# Patient Record
Sex: Male | Born: 1935 | Race: White | Hispanic: No | Marital: Married | Smoking: Never smoker
Health system: Southern US, Community
[De-identification: ages and names within clinical notes are randomized; demographics above are authoritative.]

## PROBLEM LIST (undated history)

## (undated) DIAGNOSIS — N400 Enlarged prostate without lower urinary tract symptoms: Secondary | ICD-10-CM

## (undated) DIAGNOSIS — K760 Fatty (change of) liver, not elsewhere classified: Secondary | ICD-10-CM

## (undated) DIAGNOSIS — M1990 Unspecified osteoarthritis, unspecified site: Secondary | ICD-10-CM

## (undated) DIAGNOSIS — K5790 Diverticulosis of intestine, part unspecified, without perforation or abscess without bleeding: Secondary | ICD-10-CM

## (undated) DIAGNOSIS — E119 Type 2 diabetes mellitus without complications: Secondary | ICD-10-CM

## (undated) DIAGNOSIS — I2510 Atherosclerotic heart disease of native coronary artery without angina pectoris: Secondary | ICD-10-CM

## (undated) DIAGNOSIS — I498 Other specified cardiac arrhythmias: Secondary | ICD-10-CM

## (undated) DIAGNOSIS — IMO0001 Reserved for inherently not codable concepts without codable children: Secondary | ICD-10-CM

## (undated) DIAGNOSIS — R202 Paresthesia of skin: Secondary | ICD-10-CM

## (undated) DIAGNOSIS — I10 Essential (primary) hypertension: Secondary | ICD-10-CM

## (undated) DIAGNOSIS — I219 Acute myocardial infarction, unspecified: Secondary | ICD-10-CM

## (undated) DIAGNOSIS — I779 Disorder of arteries and arterioles, unspecified: Secondary | ICD-10-CM

## (undated) DIAGNOSIS — K635 Polyp of colon: Secondary | ICD-10-CM

## (undated) DIAGNOSIS — K589 Irritable bowel syndrome without diarrhea: Secondary | ICD-10-CM

## (undated) DIAGNOSIS — E785 Hyperlipidemia, unspecified: Secondary | ICD-10-CM

## (undated) DIAGNOSIS — R9430 Abnormal result of cardiovascular function study, unspecified: Secondary | ICD-10-CM

## (undated) DIAGNOSIS — C61 Malignant neoplasm of prostate: Secondary | ICD-10-CM

## (undated) DIAGNOSIS — R200 Anesthesia of skin: Secondary | ICD-10-CM

## (undated) DIAGNOSIS — IMO0002 Reserved for concepts with insufficient information to code with codable children: Secondary | ICD-10-CM

## (undated) DIAGNOSIS — E663 Overweight: Secondary | ICD-10-CM

## (undated) DIAGNOSIS — Z9289 Personal history of other medical treatment: Secondary | ICD-10-CM

## (undated) DIAGNOSIS — I739 Peripheral vascular disease, unspecified: Secondary | ICD-10-CM

## (undated) DIAGNOSIS — D369 Benign neoplasm, unspecified site: Secondary | ICD-10-CM

## (undated) DIAGNOSIS — K219 Gastro-esophageal reflux disease without esophagitis: Secondary | ICD-10-CM

## (undated) HISTORY — DX: Abnormal result of cardiovascular function study, unspecified: R94.30

## (undated) HISTORY — DX: Essential (primary) hypertension: I10

## (undated) HISTORY — DX: Paresthesia of skin: R20.2

## (undated) HISTORY — DX: Gastro-esophageal reflux disease without esophagitis: K21.9

## (undated) HISTORY — DX: Peripheral vascular disease, unspecified: I73.9

## (undated) HISTORY — DX: Other specified cardiac arrhythmias: I49.8

## (undated) HISTORY — DX: Fatty (change of) liver, not elsewhere classified: K76.0

## (undated) HISTORY — DX: Overweight: E66.3

## (undated) HISTORY — DX: Hyperlipidemia, unspecified: E78.5

## (undated) HISTORY — PX: PROSTATE BIOPSY: SHX241

## (undated) HISTORY — PX: EYE SURGERY: SHX253

## (undated) HISTORY — DX: Anesthesia of skin: R20.0

## (undated) HISTORY — PX: NEUROPLASTY / TRANSPOSITION MEDIAN NERVE AT CARPAL TUNNEL: SUR893

## (undated) HISTORY — PX: CORONARY ANGIOPLASTY WITH STENT PLACEMENT: SHX49

## (undated) HISTORY — PX: CARDIAC CATHETERIZATION: SHX172

## (undated) HISTORY — DX: Disorder of arteries and arterioles, unspecified: I77.9

## (undated) HISTORY — PX: COLONOSCOPY: SHX174

## (undated) HISTORY — DX: Personal history of other medical treatment: Z92.89

## (undated) HISTORY — DX: Diverticulosis of intestine, part unspecified, without perforation or abscess without bleeding: K57.90

## (undated) HISTORY — DX: Atherosclerotic heart disease of native coronary artery without angina pectoris: I25.10

## (undated) HISTORY — DX: Benign neoplasm, unspecified site: D36.9

## (undated) HISTORY — DX: Benign prostatic hyperplasia without lower urinary tract symptoms: N40.0

## (undated) HISTORY — DX: Reserved for concepts with insufficient information to code with codable children: IMO0002

## (undated) HISTORY — DX: Polyp of colon: K63.5

## (undated) HISTORY — DX: Irritable bowel syndrome, unspecified: K58.9

---

## 1998-05-24 ENCOUNTER — Encounter: Payer: Self-pay | Admitting: Internal Medicine

## 1998-06-01 ENCOUNTER — Encounter: Payer: Self-pay | Admitting: Internal Medicine

## 1998-06-01 ENCOUNTER — Other Ambulatory Visit: Admission: RE | Admit: 1998-06-01 | Discharge: 1998-06-01 | Payer: Self-pay | Admitting: Internal Medicine

## 1998-07-04 ENCOUNTER — Encounter: Payer: Self-pay | Admitting: Internal Medicine

## 2000-01-15 DIAGNOSIS — I219 Acute myocardial infarction, unspecified: Secondary | ICD-10-CM

## 2000-01-15 HISTORY — DX: Acute myocardial infarction, unspecified: I21.9

## 2000-01-17 ENCOUNTER — Inpatient Hospital Stay (HOSPITAL_COMMUNITY): Admission: EM | Admit: 2000-01-17 | Discharge: 2000-01-20 | Payer: Self-pay

## 2000-02-05 ENCOUNTER — Encounter (HOSPITAL_COMMUNITY): Admission: RE | Admit: 2000-02-05 | Discharge: 2000-03-22 | Payer: Self-pay | Admitting: Cardiology

## 2000-03-24 ENCOUNTER — Encounter (HOSPITAL_COMMUNITY): Admission: RE | Admit: 2000-03-24 | Discharge: 2000-06-22 | Payer: Self-pay | Admitting: Cardiology

## 2000-10-02 ENCOUNTER — Emergency Department (HOSPITAL_COMMUNITY): Admission: EM | Admit: 2000-10-02 | Discharge: 2000-10-02 | Payer: Self-pay | Admitting: Emergency Medicine

## 2000-10-02 ENCOUNTER — Encounter: Payer: Self-pay | Admitting: Emergency Medicine

## 2000-10-02 ENCOUNTER — Encounter (INDEPENDENT_AMBULATORY_CARE_PROVIDER_SITE_OTHER): Payer: Self-pay | Admitting: *Deleted

## 2000-10-03 ENCOUNTER — Encounter (INDEPENDENT_AMBULATORY_CARE_PROVIDER_SITE_OTHER): Payer: Self-pay | Admitting: *Deleted

## 2000-10-03 ENCOUNTER — Ambulatory Visit (HOSPITAL_COMMUNITY): Admission: RE | Admit: 2000-10-03 | Discharge: 2000-10-03 | Payer: Self-pay | Admitting: Internal Medicine

## 2000-10-03 ENCOUNTER — Encounter: Payer: Self-pay | Admitting: Internal Medicine

## 2001-12-08 ENCOUNTER — Encounter: Payer: Self-pay | Admitting: Internal Medicine

## 2001-12-22 ENCOUNTER — Encounter: Payer: Self-pay | Admitting: Internal Medicine

## 2002-09-14 ENCOUNTER — Inpatient Hospital Stay (HOSPITAL_COMMUNITY): Admission: AD | Admit: 2002-09-14 | Discharge: 2002-09-16 | Payer: Self-pay | Admitting: Cardiology

## 2004-05-16 ENCOUNTER — Ambulatory Visit: Payer: Self-pay | Admitting: Cardiology

## 2004-05-21 ENCOUNTER — Ambulatory Visit: Payer: Self-pay | Admitting: Cardiology

## 2004-05-21 ENCOUNTER — Inpatient Hospital Stay (HOSPITAL_BASED_OUTPATIENT_CLINIC_OR_DEPARTMENT_OTHER): Admission: RE | Admit: 2004-05-21 | Discharge: 2004-05-21 | Payer: Self-pay | Admitting: Cardiology

## 2004-05-21 ENCOUNTER — Ambulatory Visit (HOSPITAL_COMMUNITY): Admission: RE | Admit: 2004-05-21 | Discharge: 2004-05-22 | Payer: Self-pay | Admitting: Cardiology

## 2004-05-25 ENCOUNTER — Ambulatory Visit: Payer: Self-pay | Admitting: Cardiology

## 2004-06-20 ENCOUNTER — Ambulatory Visit: Payer: Self-pay | Admitting: Cardiology

## 2004-12-18 ENCOUNTER — Ambulatory Visit: Payer: Self-pay | Admitting: Cardiology

## 2005-06-17 ENCOUNTER — Ambulatory Visit: Payer: Self-pay | Admitting: Cardiology

## 2005-11-20 ENCOUNTER — Ambulatory Visit: Payer: Self-pay | Admitting: Internal Medicine

## 2005-12-16 ENCOUNTER — Ambulatory Visit: Payer: Self-pay | Admitting: Cardiology

## 2005-12-17 ENCOUNTER — Ambulatory Visit: Payer: Self-pay | Admitting: Internal Medicine

## 2006-09-16 ENCOUNTER — Ambulatory Visit: Payer: Self-pay | Admitting: Cardiology

## 2006-09-22 ENCOUNTER — Encounter: Payer: Self-pay | Admitting: Cardiology

## 2006-09-22 ENCOUNTER — Ambulatory Visit: Payer: Self-pay

## 2007-07-02 ENCOUNTER — Ambulatory Visit: Payer: Self-pay | Admitting: Cardiology

## 2007-07-02 LAB — CONVERTED CEMR LAB
BUN: 12 mg/dL (ref 6–23)
Basophils Absolute: 0.1 10*3/uL (ref 0.0–0.1)
Basophils Relative: 0.8 % (ref 0.0–1.0)
CO2: 32 meq/L (ref 19–32)
Calcium: 9.2 mg/dL (ref 8.4–10.5)
Chloride: 103 meq/L (ref 96–112)
Creatinine, Ser: 0.9 mg/dL (ref 0.4–1.5)
Eosinophils Absolute: 0.3 10*3/uL (ref 0.0–0.7)
Eosinophils Relative: 4.8 % (ref 0.0–5.0)
GFR calc Af Amer: 107 mL/min
GFR calc non Af Amer: 88 mL/min
Glucose, Bld: 150 mg/dL — ABNORMAL HIGH (ref 70–99)
HCT: 43.3 % (ref 39.0–52.0)
Hemoglobin: 15.2 g/dL (ref 13.0–17.0)
Lymphocytes Relative: 20.7 % (ref 12.0–46.0)
MCHC: 35.2 g/dL (ref 30.0–36.0)
MCV: 89.8 fL (ref 78.0–100.0)
Monocytes Absolute: 0.6 10*3/uL (ref 0.1–1.0)
Monocytes Relative: 8.3 % (ref 3.0–12.0)
Neutro Abs: 4.4 10*3/uL (ref 1.4–7.7)
Neutrophils Relative %: 65.4 % (ref 43.0–77.0)
Platelets: 120 10*3/uL — ABNORMAL LOW (ref 150–400)
Potassium: 5.1 meq/L (ref 3.5–5.1)
RBC: 4.82 M/uL (ref 4.22–5.81)
RDW: 12.6 % (ref 11.5–14.6)
Sodium: 141 meq/L (ref 135–145)
Total CK: 72 units/L (ref 7–195)
WBC: 6.8 10*3/uL (ref 4.5–10.5)

## 2007-07-14 ENCOUNTER — Ambulatory Visit: Payer: Self-pay | Admitting: Cardiology

## 2007-10-19 ENCOUNTER — Ambulatory Visit: Payer: Self-pay | Admitting: Cardiology

## 2007-12-24 ENCOUNTER — Ambulatory Visit: Payer: Self-pay | Admitting: Cardiology

## 2007-12-30 ENCOUNTER — Ambulatory Visit: Payer: Self-pay

## 2007-12-30 ENCOUNTER — Encounter: Payer: Self-pay | Admitting: Cardiology

## 2008-01-01 ENCOUNTER — Ambulatory Visit: Payer: Self-pay | Admitting: Cardiology

## 2008-03-21 ENCOUNTER — Encounter: Payer: Self-pay | Admitting: Cardiology

## 2008-03-21 ENCOUNTER — Ambulatory Visit: Payer: Self-pay | Admitting: Cardiology

## 2008-08-03 ENCOUNTER — Encounter: Payer: Self-pay | Admitting: Cardiology

## 2008-10-25 ENCOUNTER — Encounter: Payer: Self-pay | Admitting: Cardiology

## 2008-10-25 DIAGNOSIS — K219 Gastro-esophageal reflux disease without esophagitis: Secondary | ICD-10-CM | POA: Insufficient documentation

## 2008-10-25 DIAGNOSIS — E119 Type 2 diabetes mellitus without complications: Secondary | ICD-10-CM | POA: Insufficient documentation

## 2008-10-25 DIAGNOSIS — E1169 Type 2 diabetes mellitus with other specified complication: Secondary | ICD-10-CM | POA: Insufficient documentation

## 2008-11-08 ENCOUNTER — Ambulatory Visit: Payer: Self-pay | Admitting: Cardiology

## 2008-11-08 DIAGNOSIS — E663 Overweight: Secondary | ICD-10-CM | POA: Insufficient documentation

## 2009-08-23 ENCOUNTER — Encounter: Payer: Self-pay | Admitting: Cardiology

## 2009-08-31 ENCOUNTER — Telehealth: Payer: Self-pay | Admitting: Cardiology

## 2009-08-31 ENCOUNTER — Encounter: Payer: Self-pay | Admitting: Cardiology

## 2009-09-01 ENCOUNTER — Encounter: Payer: Self-pay | Admitting: Internal Medicine

## 2009-09-05 ENCOUNTER — Ambulatory Visit: Payer: Self-pay | Admitting: Cardiology

## 2009-09-11 ENCOUNTER — Telehealth (INDEPENDENT_AMBULATORY_CARE_PROVIDER_SITE_OTHER): Payer: Self-pay | Admitting: *Deleted

## 2009-09-12 ENCOUNTER — Ambulatory Visit: Payer: Self-pay | Admitting: Cardiology

## 2009-09-12 ENCOUNTER — Encounter: Payer: Self-pay | Admitting: Cardiology

## 2009-09-12 ENCOUNTER — Ambulatory Visit: Payer: Self-pay

## 2009-09-12 ENCOUNTER — Encounter (HOSPITAL_COMMUNITY): Admission: RE | Admit: 2009-09-12 | Discharge: 2009-10-06 | Payer: Self-pay | Admitting: Cardiology

## 2009-09-22 ENCOUNTER — Encounter: Payer: Self-pay | Admitting: Cardiology

## 2009-09-25 ENCOUNTER — Ambulatory Visit: Payer: Self-pay | Admitting: Cardiology

## 2009-09-25 LAB — CONVERTED CEMR LAB
BUN: 15 mg/dL (ref 6–23)
Basophils Absolute: 0.1 10*3/uL (ref 0.0–0.1)
Basophils Relative: 0.9 % (ref 0.0–3.0)
CO2: 29 meq/L (ref 19–32)
Calcium: 9 mg/dL (ref 8.4–10.5)
Chloride: 104 meq/L (ref 96–112)
Creatinine, Ser: 0.9 mg/dL (ref 0.4–1.5)
Eosinophils Absolute: 0.4 10*3/uL (ref 0.0–0.7)
Eosinophils Relative: 4.8 % (ref 0.0–5.0)
GFR calc non Af Amer: 86.54 mL/min (ref >60)
Glucose, Bld: 164 mg/dL — ABNORMAL HIGH (ref 70–99)
HCT: 42.3 % (ref 39.0–52.0)
Hemoglobin: 14.3 g/dL (ref 13.0–17.0)
INR: 0.9 (ref 0.8–1.0)
Lymphocytes Relative: 18.3 % (ref 12.0–46.0)
Lymphs Abs: 1.4 10*3/uL (ref 0.7–4.0)
MCHC: 33.6 g/dL (ref 30.0–36.0)
MCV: 91.7 fL (ref 78.0–100.0)
Monocytes Absolute: 0.7 10*3/uL (ref 0.1–1.0)
Monocytes Relative: 8.6 % (ref 3.0–12.0)
Neutro Abs: 5.1 10*3/uL (ref 1.4–7.7)
Neutrophils Relative %: 67.4 % (ref 43.0–77.0)
Platelets: 168 10*3/uL (ref 150.0–400.0)
Potassium: 4.8 meq/L (ref 3.5–5.1)
Prothrombin Time: 10.1 s (ref 9.7–11.8)
RBC: 4.62 M/uL (ref 4.22–5.81)
RDW: 13.4 % (ref 11.5–14.6)
Sodium: 140 meq/L (ref 135–145)
WBC: 7.6 10*3/uL (ref 4.5–10.5)

## 2009-09-29 ENCOUNTER — Ambulatory Visit: Payer: Self-pay | Admitting: Cardiology

## 2009-09-29 ENCOUNTER — Inpatient Hospital Stay (HOSPITAL_BASED_OUTPATIENT_CLINIC_OR_DEPARTMENT_OTHER): Admission: RE | Admit: 2009-09-29 | Discharge: 2009-09-29 | Payer: Self-pay | Admitting: Cardiovascular Disease

## 2009-10-17 ENCOUNTER — Encounter: Payer: Self-pay | Admitting: Cardiology

## 2009-10-19 ENCOUNTER — Ambulatory Visit: Payer: Self-pay | Admitting: Cardiology

## 2009-10-19 ENCOUNTER — Ambulatory Visit: Payer: Self-pay | Admitting: Internal Medicine

## 2009-10-19 DIAGNOSIS — Z860100 Personal history of colon polyps, unspecified: Secondary | ICD-10-CM | POA: Insufficient documentation

## 2009-10-19 DIAGNOSIS — R195 Other fecal abnormalities: Secondary | ICD-10-CM | POA: Insufficient documentation

## 2009-11-23 ENCOUNTER — Ambulatory Visit: Payer: Self-pay | Admitting: Internal Medicine

## 2009-11-23 LAB — CONVERTED CEMR LAB: UREASE: NEGATIVE

## 2009-11-27 ENCOUNTER — Encounter: Payer: Self-pay | Admitting: Internal Medicine

## 2010-01-18 ENCOUNTER — Ambulatory Visit
Admission: RE | Admit: 2010-01-18 | Discharge: 2010-01-18 | Payer: Self-pay | Source: Home / Self Care | Attending: Cardiology | Admitting: Cardiology

## 2010-01-31 ENCOUNTER — Ambulatory Visit: Admission: RE | Admit: 2010-01-31 | Discharge: 2010-01-31 | Payer: Self-pay | Source: Home / Self Care

## 2010-01-31 ENCOUNTER — Encounter: Payer: Self-pay | Admitting: Cardiology

## 2010-02-15 NOTE — Procedures (Signed)
Summary: EGD/[facility]  EGD/ Health[facility]   Imported By: Sherian Rein 10/20/2009 14:39:52  _____________________________________________________________________  External Attachment:    Type:   Image     Comment:   External Document

## 2010-02-15 NOTE — Progress Notes (Signed)
Summary: Nuclear Pre-Procedure  Phone Note Outgoing Call   Call placed by: Milana Na, EMT-P,  September 11, 2009 2:44 PM Summary of Call: Reviewed information on Myoview Information Sheet (see scanned document for further details).  Spoke with the patient.     Nuclear Med Background Indications for Stress Test: Evaluation for Ischemia, Stent Patency   History: Angioplasty, Echo, Heart Catheterization, Myocardial Infarction, Myocardial Perfusion Study, Stents   Symptoms: Chest Pain    Nuclear Pre-Procedure Cardiac Risk Factors: Hypertension, Lipids, NIDDM Height (in): 66  Nuclear Med Study Referring MD:  [employer]

## 2010-02-15 NOTE — Assessment & Plan Note (Signed)
Summary: Cardiology Office Visit   Visit Type:  Follow-up PCP:  Guerry Bruin, MD   History of Present Illness: [name] Ray is here for cardiology followup. I saw him last December 2009.he has done well since then. He has recorded his blood pressures.he is isending this information also to Dr. tests are back. In general his pressure is well controlled. He does have a systolic ranging up into the 140s at times. I will not be changing his meds at this point.he has follow up with Dr. Pryor Montes her back soon. He does mention having some discomfort in his left arm. This is most marked when he is sitting at his computer. He also has some hand tingling. I believe that this is probably musculoskeletal.   Prior Medications Reviewed Using: List Brought by Patient  Updated Prior Medication List: PLAVIX 75 MG TABS (CLOPIDOGREL BISULFATE) Take one tablet by mouth daily RAMIPRIL 10 MG CAPS (RAMIPRIL) Take one capsule by mouth daily ASPIRIN EC 325 MG TBEC (ASPIRIN) Take one tablet by mouth daily SIMVASTATIN 20 MG TABS (SIMVASTATIN) Take one tablet by mouth daily at bedtime AMLODIPINE BESYLATE 10 MG TABS (AMLODIPINE BESYLATE) Take one tablet by mouth daily CALCIUM CARBONATE-VITAMIN D 600-400 MG-UNIT  TABS (CALCIUM CARBONATE-VITAMIN D) Take 1 tablet by mouth once a day OMEPRAZOLE 20 MG  CPDR (OMEPRAZOLE) 1 each day 30 minutes before meal GLUCOSAMINE 1500 COMPLEX  CAPS (GLUCOSAMINE-CHONDROIT-VIT C-MN) Take 1 tablet by mouth once a day JANUMET 50-500 MG TABS (SITAGLIPTIN-METFORMIN HCL) Take 1 tablet by mouth once a day NITROGLYCERIN 0.4 MG SUBL (NITROGLYCERIN) One tablet under tongue every 5 minutes as needed for chest pain---may repeat times three HYDROCODONE-ACETAMINOPHEN 5-500 MG TABS (HYDROCODONE-ACETAMINOPHEN) prn MULTIVITAMINS   TABS (MULTIPLE VITAMIN) Take 1 tablet by mouth once a day LORADAMED 10 MG TABS (LORATADINE) as needed  Current Allergies (reviewed today): ! CRESTOR (ROSUVASTATIN CALCIUM) Past  Medical History:    Reviewed history from 01/01/2008 and no changes required:       GERD       Irritable bowel       History adenomatous polyp       Diverticulosis       History fatty liver       Diabetes       Hypercholesterolemia       Good LV function       Atrial bigeminy in the past       Long-term Plavix       CAD. Acute inferior MI January 2002 with stent treatment. Taxus stent to the LAD 2004. Cypher Stent to the proximal edge of the LAD and Cypher stent to the mid circumflex May 2006       Hypertension   Review of Systems       Patient denies fevers or chills. He is not having headaches. He's not having any GI or GU symptoms. There are no skin abnormalities. His review of system otherwise is negative.   Vital Signs:  Patient Profile:   77 Years Old Male Weight:      220 pounds Pulse rate:   60 / minute BP sitting:   110 / 64  (left arm)  Vitals Entered By: Meredith Staggers, RN (March 21, 2008 11:52 AM)                Physical Exam  General:     In general he is a quite stable. Head:     HEENT reveals no xanthelasma. He has normal extraocular motion. Mouth:  He has no dental complaints. Neck:     neck exam reveals no carotid bruits. There is no jugular venous distention. Chest Wall:     chest wall reveals no abnormalities and he has no tenderness. Lungs:     Lung exam reveals an S1-S2. There are no clicks or significant murmurs. Heart:     Cardiac exam reveals an S1-S2. There are no clicks or significant murmurs. Abdomen:     Abdomen is protuberant but soft. There are no masses or bruits. Extremities:     There is no peripheral edema very Psych:     The patient is oriented to person time and place. Affect is normal.   Echocardiogram  Procedure date:  03/21/2008  Findings:      EKG is done and reviewed. It is completely normal.  Nuclear ETT  Procedure date:  12/30/2007  Findings:      Adenosine Myoview scan completed Normal LV function  ejection fraction 63%. No significant ischemia.   Impression & Recommendations:  Problem # 1:  HYPERTENSION, BENIGN (ICD-401.1) Blood pressure is now under good control. I will not change his meds any further. He will followup with Dr.Tisovec. His updated medication list for this problem includes:    Ramipril 10 Mg Caps (Ramipril) .Marland Kitchen... Take one capsule by mouth daily    Aspirin Ec 325 Mg Tbec (Aspirin) .Marland Kitchen... Take one tablet by mouth daily    Amlodipine Besylate 10 Mg Tabs (Amlodipine besylate) .Marland Kitchen... Take one tablet by mouth daily   Problem # 2:  CAD, NATIVE VESSEL (ICD-414.01) As outlined his EKG today is normal. His last coronary procedure was a Cypher stent in 2 places in May 2006. In December 2009 he had a normal stress nuclear study. He is having arm discomfort today. I believe this is not coronary ischemia. Also he has some tingling in his hands. I have encouraged him to discuss this with Dr. Wylene Simmer.    His updated medication list for this problem includes:    Plavix 75 Mg Tabs (Clopidogrel bisulfate) .Marland Kitchen... Take one tablet by mouth daily    Ramipril 10 Mg Caps (Ramipril) .Marland Kitchen... Take one capsule by mouth daily    Aspirin Ec 325 Mg Tbec (Aspirin) .Marland Kitchen... Take one tablet by mouth daily    Amlodipine Besylate 10 Mg Tabs (Amlodipine besylate) .Marland Kitchen... Take one tablet by mouth daily    Nitroglycerin 0.4 Mg Subl (Nitroglycerin) ..... One tablet under tongue every 5 minutes as needed for chest pain---may repeat times three

## 2010-02-15 NOTE — Miscellaneous (Signed)
  Clinical Lists Changes  Observations: Added new observation of PAST MED HX: GERD Irritable bowel History adenomatous polyp Diverticulosis History fatty liver Diabetes Hypercholesterolemia EF  normal... echo... September, 2008  /    63% ...nuclear...12/2007 Atrial bigeminy in the past Long-term Plavix CAD. 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  /    Taxus stent to the LAD 2004.  /   Cypher Stent proximal edge  LAD and Cypher stent  mid circumflex May 2006 /   nuclear stress 12/2007..normal..EF  63%  /   nuclear.. September 12, 2009... EF 62%... inferior thinning and very mild ischemia  inferior and apex Hypertension Tingling in hands ... resolved Left hand numbness and slight tingling different from prior sensation in his hands (09/22/2009 15:05) Added new observation of PRIMARY MD: Guerry Bruin, MD (09/22/2009 15:05)       Past History:  Past Medical History: GERD Irritable bowel History adenomatous polyp Diverticulosis History fatty liver Diabetes Hypercholesterolemia EF  normal... echo... September, 2008  /    63% ...nuclear...12/2007 Atrial bigeminy in the past Long-term Plavix CAD. 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  /    Taxus stent to the LAD 2004.  /   Cypher Stent proximal edge  LAD and Cypher stent  mid circumflex May 2006 /   nuclear stress 12/2007..normal..EF  63%  /   nuclear.. September 12, 2009... EF 62%... inferior thinning and very mild ischemia  inferior and apex Hypertension Tingling in hands ... resolved Left hand numbness and slight tingling different from prior sensation in his hands

## 2010-02-15 NOTE — Procedures (Signed)
Summary: Colonoscopy  Patient: [name] Ray Note: All result statuses are Final unless otherwise noted.  Tests: (1) Colonoscopy (COL)   COL Colonoscopy           DONE     [facility]     [address]     [place], [place]  [address]           COLONOSCOPY PROCEDURE REPORT           PATIENT:  [name], [name]  MR#:  [id number]     BIRTHDATE:  December 15, 1933, 76 yrs. old  GENDER:  male     ENDOSCOPIST:  Wilhemina Bonito. Eda Keys, MD     REF. BY:  Guerry Bruin, M.D.     PROCEDURE DATE:  11/23/2009     PROCEDURE:  Colonoscopy with snare polypectomy x 2     ASA CLASS:  Class III     INDICATIONS:  history of pre-cancerous (adenomatous) colon polyps,     surveillance and high-risk screening, heme positive stool ; index     exam 2000 w/ f/u 2003 and 2007     MEDICATIONS:   Fentanyl 100 mcg IV, Versed 10 mg IV, Benadryl 25     mg IV           DESCRIPTION OF PROCEDURE:   After the risks benefits and     alternatives of the procedure were thoroughly explained, informed     consent was obtained.  Digital rectal exam was performed and     revealed no abnormalities.   The LB [id number] endoscope     was introduced through the anus and advanced to the cecum, which     was identified by both the appendix and ileocecal valve, without     limitations.Time to cecum = 2:16 min.  The quality of the prep was     good, using MoviPrep.  The instrument was then slowly withdrawn     (time = 13:17 min) as the colon was fully examined.     <<PROCEDUREIMAGES>>           FINDINGS:  Two 2mm polyps were found in the transverse colon.     Polyps were snared without cautery. Retrieval was successful.     Mild diverticulosis was found in the sigmoid colon. The colon was     otherwise normal.  The terminal ileum appeared normal as well.     Retroflexed views in the rectum revealed small internal hemorrhoids.     The scope was then withdrawn from the patient and the procedure     completed.        COMPLICATIONS:  None     ENDOSCOPIC IMPRESSION:     1) Two polyps in the transverse colon - removed     2) Mild diverticulosis in the sigmoid colon     3) Normal terminal ileum     4) Internal hemorrhoids           RECOMMENDATIONS:     1) Follow up colonoscopy in 5 years if medically fit           ______________________________     Wilhemina Bonito. Eda Keys, MD           CC:  The Patient; Guerry Bruin, MD; Luis Abed, MD           n.     Rosalie Doctor:   Wilhemina Bonito. Eda Keys at 11/23/2009 02:14 PM  Chike, [place], [id number]  Note: An exclamation mark (!) indicates a result that was not dispersed into the flowsheet. Document Creation Date: 11/23/2009 2:15 PM _______________________________________________________________________  (1) Order result status: Final Collection or observation date-time: 11/23/2009 13:53 Requested date-time:  Receipt date-time:  Reported date-time:  Referring Physician:   Ordering Physician: Fransico Setters [phone]) Specimen Source:  Source: Launa Grill Order Number: [id number] Lab site:   Appended Document: Colonoscopy     Procedures Next Due Date:    Colonoscopy: 11/2014

## 2010-02-15 NOTE — Assessment & Plan Note (Signed)
Summary: YEARLY/SL    Visit Type:  Follow-up Primary Provider:  Guerry Bruin, MD  CC:  CAD.  History of Present Illness: The patient is seen for followup of coronary disease.  He is doing well.  He's not having chest pain or arm pain.  His blood pressure has stabilized nicely.  He doesn't have full activities.  He has not had syncope or presyncope.  Current Medications (verified): 1)  Plavix 75 Mg Tabs (Clopidogrel Bisulfate) .... Take One Tablet By Mouth Daily 2)  Ramipril 10 Mg Caps (Ramipril) .... Take One Capsule By Mouth Daily 3)  Aspirin Ec 325 Mg Tbec (Aspirin) .... Take One Tablet By Mouth Daily 4)  Simvastatin 20 Mg Tabs (Simvastatin) .... Take One Tablet By Mouth Daily At Bedtime 5)  Amlodipine Besylate 10 Mg Tabs (Amlodipine Besylate) .... Take One Tablet By Mouth Daily 6)  Calcium Carbonate-Vitamin D 600-400 Mg-Unit  Tabs (Calcium Carbonate-Vitamin D) .... Take 1 Tablet By Mouth Once A Day 7)  Omeprazole 20 Mg  Cpdr (Omeprazole) .Marland Kitchen.. 1 Each Day 30 Minutes Before Meal 8)  Glucosamine 1500 Complex  Caps (Glucosamine-Chondroit-Vit C-Mn) .... Take 1 Tablet By Mouth Once A Day 9)  Janumet 50-500 Mg Tabs (Sitagliptin-Metformin Hcl) .... Take 1 Tablet By Mouth Once A Day 10)  Nitroglycerin 0.4 Mg Subl (Nitroglycerin) .... One Tablet Under Tongue Every 5 Minutes As Needed For Chest Pain---May Repeat Times Three 11)  Hydrocodone-Acetaminophen 5-500 Mg Tabs (Hydrocodone-Acetaminophen) .... Prn 12)  Multivitamins   Tabs (Multiple Vitamin) .... Take 1 Tablet By Mouth Once A Day 13)  Loradamed 10 Mg Tabs (Loratadine) .... As Needed  Allergies: 1)  ! Crestor (Rosuvastatin Calcium)  Past History:  Past Medical History: GERD Irritable bowel History adenomatous polyp Diverticulosis History fatty liver Diabetes Hypercholesterolemia Good LV function Atrial bigeminy in the past Long-term Plavix CAD. Acute inferior MI January 2002 with stent treatment. Taxus stent to the LAD  2004. Cypher Stent to the proximal edge of the LAD and Cypher stent to the mid circumflex May 2006 / nuclear stress 12/2007..normal Hypertension Tingling in hands ... resolved  Review of Systems       Patient denies fever, chills, headache, sweats, rash, change in vision, change in hearing, chest pain, shortness of breath, cough, nausea vomiting, urinary symptoms, musculoskeletal problems.  All other systems are reviewed and are negative.  Vital Signs:  Patient profile:   77 year old male Height:      66 inches Weight:      222 pounds BMI:     35.96 Pulse rate:   67 / minute BP sitting:   120 / 70  (left arm)  Vitals Entered By: Laurance Flatten CMA (November 08, 2008 9:53 AM)  Physical Exam  General:  patient is stable.  He is overweight. Eyes:  no xanthelasma. Neck:  no jugular venous distention. Lungs:  lungs are clear.  Respiratory effort is nonlabored. Heart:  cardiac exam reveals S1 and S2.  No clicks or significant murmurs. Abdomen:  abdomen is protuberant but soft. Extremities:  no peripheral edema. Psych:  patient is oriented to person time and place.  Affect is normal.   Impression & Recommendations:  Problem # 1:  * PLAVIX.Marland KitchenMarland KitchenLONG TERM Patient has had audible interventions.  I feel it is most prudent to keep him on Plavix long-term.  Problem # 2:  CAD, NATIVE VESSEL (ICD-414.01)  His updated medication list for this problem includes:    Plavix 75 Mg Tabs (Clopidogrel bisulfate) .Marland Kitchen... Take  one tablet by mouth daily    Ramipril 10 Mg Caps (Ramipril) .Marland Kitchen... Take one capsule by mouth daily    Aspirin Ec 325 Mg Tbec (Aspirin) .Marland Kitchen... Take one tablet by mouth daily    Amlodipine Besylate 10 Mg Tabs (Amlodipine besylate) .Marland Kitchen... Take one tablet by mouth daily    Nitroglycerin 0.4 Mg Subl (Nitroglycerin) ..... One tablet under tongue every 5 minutes as needed for chest pain---may repeat times three Coronary disease is stable today.  EKG is done and reviewed by me.  There are no  significant abnormalities.  Orders: EKG w/ Interpretation (93000)  Problem # 3:  HYPERTENSION, BENIGN (ICD-401.1)  His updated medication list for this problem includes:    Ramipril 10 Mg Caps (Ramipril) .Marland Kitchen... Take one capsule by mouth daily    Aspirin Ec 325 Mg Tbec (Aspirin) .Marland Kitchen... Take one tablet by mouth daily    Amlodipine Besylate 10 Mg Tabs (Amlodipine besylate) .Marland Kitchen... Take one tablet by mouth daily Blood pressure is nicely controlled today.  Problem # 4:  OVERWEIGHT (ICD-278.02) Weight loss of course would be very helpful for him.I'll see him back in one year for cardiology followup.  Patient Instructions: 1)  Your physician recommends that you schedule a follow-up appointment in: 12 months. The office will send you a reinder 1 to 2 months prior appointment date.

## 2010-02-15 NOTE — Procedures (Signed)
Summary: [facility]   Imported By: Sherian Rein 10/20/2009 14:41:39  _____________________________________________________________________  External Attachment:    Type:   Image     Comment:   External Document

## 2010-02-15 NOTE — Progress Notes (Signed)
Summary: [facility]   Imported By: Sherian Rein 10/20/2009 14:44:05  _____________________________________________________________________  External Attachment:    Type:   Image     Comment:   External Document

## 2010-02-15 NOTE — Miscellaneous (Signed)
Summary: Orders Update  Clinical Lists Changes  Orders: Added new Test order of TLB-H Pylori Screen Gastric Biopsy (83013-CLOTEST) - Signed 

## 2010-02-15 NOTE — Consult Note (Signed)
Summary: [facility]   Imported By: Sherian Rein 10/20/2009 14:43:04  _____________________________________________________________________  External Attachment:    Type:   Image     Comment:   External Document

## 2010-02-15 NOTE — Letter (Signed)
Summary: Patient Notice- Polyp Results  [facility]  [address] [place], [place] [address]   Phone: [phone]  Fax: [phone]        November 27, 2009 MRN: [id number]    [name] Ray [address] [place], [place]  [address]    Dear [name] Ray,  I am pleased to inform you that the colon polyp(s) removed during your recent colonoscopy was (were) found to be benign (no cancer detected) upon pathologic examination.  I recommend you have a repeat colonoscopy examination in 5 years to look for recurrent polyps, as having colon polyps increases your risk for having recurrent polyps or even colon cancer in the future.  Should you develop new or worsening symptoms of abdominal pain, bowel habit changes or bleeding from the rectum or bowels, please schedule an evaluation with either your primary care physician or with me.  Additional information/recommendations:  __ No further action with gastroenterology is needed at this time. Please      follow-up with your primary care physician for your other healthcare      needs.    Please call us if you are having persistent problems or have questions about your condition that have not been fully answered at this time.  Sincerely,  Hilarie Fredrickson MD  This letter has been electronically signed by your physician.  Appended Document: Patient Notice- Polyp Results Letter mailed

## 2010-02-15 NOTE — Letter (Signed)
Summary: [facility] Instructions  [facility]  [address] [place], [place] [address]   Phone: [phone]  Fax: [phone]       [name] Ray    [birth year]    MRN: [id number]        Procedure Day /Date:THURSDAY 11/23/09     Arrival Time:12:30 PM     Procedure Time:1:30 PM     Location of Procedure:                    [facility] (4th Floor)           PREPARATION FOR COLONOSCOPY WITH MOVIPREP/ENDO   Starting 5 days prior to your procedure11/5/11 do not eat nuts, seeds, popcorn, corn, beans, peas,  salads, or any raw vegetables.  Do not take any fiber supplements (e.g. Metamucil, Citrucel, and Benefiber).  THE DAY BEFORE YOUR PROCEDURE         DATE: 11/22/09  DAY: WEDNESDAY  1.  Drink clear liquids the entire day-NO SOLID FOOD  2.  Do not drink anything colored red or purple.  Avoid juices with pulp.  No orange juice.  3.  Drink at least 64 oz. (8 glasses) of fluid/clear liquids during the day to prevent dehydration and help the prep work efficiently.  CLEAR LIQUIDS INCLUDE: Water Jello Ice Popsicles Tea (sugar ok, no milk/cream) Powdered fruit flavored drinks Coffee (sugar ok, no milk/cream) Gatorade Juice: apple, white grape, white cranberry  Lemonade Clear bullion, consomm, broth Carbonated beverages (any kind) Strained chicken noodle soup Hard Candy                             4.  In the morning, mix first dose of MoviPrep solution:    Empty 1 Pouch A and 1 Pouch B into the disposable container    Add lukewarm drinking water to the top line of the container. Mix to dissolve    Refrigerate (mixed solution should be used within 24 hrs)  5.  Begin drinking the prep at 5:00 p.m. The MoviPrep container is divided by 4 marks.   Every 15 minutes drink the solution down to the next mark (approximately 8 oz) until the full liter is complete.   6.  Follow completed prep with 16 oz of clear liquid of your choice (Nothing red or  purple).  Continue to drink clear liquids until bedtime.  7.  Before going to bed, mix second dose of MoviPrep solution:    Empty 1 Pouch A and 1 Pouch B into the disposable container    Add lukewarm drinking water to the top line of the container. Mix to dissolve    Refrigerate  THE DAY OF YOUR PROCEDURE      DATE: 11/23/09 DAY: THURSDAY  Beginning at 8:30 a.m. (5 hours before procedure):         1. Every 15 minutes, drink the solution down to the next mark (approx 8 oz) until the full liter is complete.  2. Follow completed prep with 16 oz. of clear liquid of your choice.    3. You may drink clear liquids until 11:30 AM (2 HOURS BEFORE PROCEDURE).   MEDICATION INSTRUCTIONS  Unless otherwise instructed, you should take regular prescription medications with a small sip of water   as early as possible the morning of your procedure.  Diabetic patients - see separate instructions. STAY ON ASPIRIN AND PLAVIX PER DR. PERRY  OTHER INSTRUCTIONS  You will need a responsible adult at least 75 years of age to accompany you and drive you home.   This person must remain in the waiting room during your procedure.  Wear loose fitting clothing that is easily removed.  Leave jewelry and other valuables at home.  However, you may wish to bring a book to read or  an iPod/MP3 player to listen to music as you wait for your procedure to start.  Remove all body piercing jewelry and leave at home.  Total time from sign-in until discharge is approximately 2-3 hours.  You should go home directly after your procedure and rest.  You can resume normal activities the  day after your procedure.  The day of your procedure you should not:   Drive   Make legal decisions   Operate machinery   Drink alcohol   Return to work  You will receive specific instructions about eating, activities and medications before you leave.    The above instructions have been reviewed and explained  to me by   _______________________    I fully understand and can verbalize these instructions _____________________________ Date _________

## 2010-02-15 NOTE — Miscellaneous (Signed)
  Clinical Lists Changes  Problems: Added new problem of GERD (ICD-530.81) Added new problem of DYSLIPIDEMIA (ICD-272.4) Added new problem of * PLAVIX.Marland KitchenMarland KitchenLONG TERM Added new problem of DM (ICD-250.00) Added new problem of * TINGLING IN HANDS Observations: Added new observation of PAST MED HX: GERD Irritable bowel History adenomatous polyp Diverticulosis History fatty liver Diabetes Hypercholesterolemia Good LV function Atrial bigeminy in the past Long-term Plavix CAD. Acute inferior MI January 2002 with stent treatment. Taxus stent to the LAD 2004. Cypher Stent to the proximal edge of the LAD and Cypher stent to the mid circumflex May 2006 / nuclear stress 12/2007..normal Hypertension Tingling in hands (10/25/2008 7:19) Added new observation of PRIMARY MD: Guerry Bruin, MD (10/25/2008 7:19)       Past History:  Past Medical History: GERD Irritable bowel History adenomatous polyp Diverticulosis History fatty liver Diabetes Hypercholesterolemia Good LV function Atrial bigeminy in the past Long-term Plavix CAD. Acute inferior MI January 2002 with stent treatment. Taxus stent to the LAD 2004. Cypher Stent to the proximal edge of the LAD and Cypher stent to the mid circumflex May 2006 / nuclear stress 12/2007..normal Hypertension Tingling in hands

## 2010-02-15 NOTE — Letter (Signed)
Summary: Diabetic Instructions  [facility]  [address] [place], [place] [address]   Phone: [phone]  Fax: [phone]    [name] Ray 1933-07-01 MRN: [id number]   x_   ORAL DIABETIC MEDICATION INSTRUCTIONS  The day before your procedure:   Take your diabetic pill as you do normally  The day of your procedure:   Do not take your diabetic pill    We will check your blood sugar levels during the admission process and again in Recovery before discharging you home  ________________________________________________________________________

## 2010-02-15 NOTE — Assessment & Plan Note (Signed)
Summary: eph post cath/mt      Allergies Added:   Visit Type:  Follow-up Primary Provider:  Guerry Bruin, MD  CC:  CAD.  History of Present Illness: The patient is seen for followup of coronary artery disease.  I saw him last September 25, 2009.  At that time we discussed symptoms were limiting his outside activities.  His exercise test had shown some mild ischemia in the inferior wall and apex.  Decision was made to proceed with catheterization.  This was done September 29, 2009.  Showed mild-to-moderate diffuse right coronary artery disease. There was moderate discrete LAD stenosis before the stent.  This was not flow limiting.  It was felt that medical therapy was most appropriate.  He seen in the office today in followup and he seems to be doing better.  He also is having some GI symptoms.  He will have some GI studies.  Current Medications (verified): 1)  Plavix 75 Mg Tabs (Clopidogrel Bisulfate) .... Take One Tablet By Mouth Daily 2)  Ramipril 10 Mg Caps (Ramipril) .... Take One Capsule By Mouth Daily 3)  Aspirin Ec 325 Mg Tbec (Aspirin) .... Take One Tablet By Mouth Daily 4)  Simvastatin 20 Mg Tabs (Simvastatin) .... Take One Tablet By Mouth Daily At Bedtime 5)  Amlodipine Besylate 10 Mg Tabs (Amlodipine Besylate) .... Take One Tablet By Mouth Daily 6)  Calcium Carbonate-Vitamin D 600-400 Mg-Unit  Tabs (Calcium Carbonate-Vitamin D) .... Take 1 Tablet By Mouth Once A Day 7)  Omeprazole 20 Mg  Cpdr (Omeprazole) .Marland Kitchen.. 1 Each Day 30 Minutes Before Meal 8)  Glucosamine 1500 Complex  Caps (Glucosamine-Chondroit-Vit C-Mn) .... Take 1 Tablet By Mouth Once A Day 9)  Janumet 50-500 Mg Tabs (Sitagliptin-Metformin Hcl) .... Take 1 Tablet By Mouth Once A Day 10)  Nitroglycerin 0.4 Mg Subl (Nitroglycerin) .... One Tablet Under Tongue Every 5 Minutes As Needed For Chest Pain---May Repeat Times Three 11)  Loradamed 10 Mg Tabs (Loratadine) .... As Needed 12)  Fish Oil   Oil (Fish Oil) .... 1000mg   Once Daily 13)  Vitamin D 400 Unit  Tabs (Cholecalciferol) .... Once Daily 14)  Gaviscon Extra Relief Formula 160-105 Mg Chew (Alum Hydroxide-Mag Carbonate) .... As Needed  Allergies (verified): 1)  ! Crestor (Rosuvastatin Calcium) 2)  ! Lipitor  Past History:  Past Medical History: GERD Irritable bowel History adenomatous polyp Diverticulosis History fatty liver Diabetes Hypercholesterolemia EF  normal... echo... September, 2008  /    63% ...nuclear...12/2007 Atrial bigeminy in the past Long-term Plavix CAD. 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  /    Taxus stent to the LAD 2004.  /   Cypher Stent proximal edge  LAD and Cypher stent  mid circumflex May 2006 /   nuclear stress 12/2007..normal..EF  63%  /   nuclear.. September 12, 2009... EF 62%... inferior thinning and very mild ischemia  inferior and apex  /  catheterization.. September 29, 2009.... EF 60%.. mild-to-moderate diffuse RCA disease.... moderate (50-60%)discrete LAD stenosis before the stent in the proximal LAD - medical therapy recommended Hypertension Tingling in hands ... resolved Left hand numbness and slight tingling different from prior sensation in his hands BPH Colon Polyps  Review of Systems       Patient denies fever, chills, headache, sweats, rash, change in vision, change in hearing, chest pain, cough, nausea vomiting, urinary symptoms.  All other systems are reviewed and are negative.  Vital Signs:  Patient profile:   77 year old male  Height:      66 inches Weight:      227 pounds BMI:     36.77 Pulse rate:   75 / minute BP sitting:   118 / 52  (left arm) Cuff size:   regular  Vitals Entered By: Hardin Negus, RMA (October 19, 2009 2:03 PM)  Physical Exam  General:  patient is stable. Eyes:  no xanthelasma. Neck:  no jugular venous distention. Lungs:  lungs are clear.  Respiratory effort is nonlabored. Heart:  cardiac exam reveals S1-S2.  No clicks or significant murmurs. Abdomen:   abdomen is soft. Extremities:  no peripheral edema. Psych:  patient is oriented to person time and place.  Affect is normal.   Impression & Recommendations:  Problem # 1:  * LEFT HAND DISCOMFORT... 2011 this tingling in his left hand is improved.  No change in therapy.  Problem # 2:  OVERWEIGHT (ICD-278.02) We talked today about weight loss.  His wife is present also.  Problem # 3:  CAD, NATIVE VESSEL (ICD-414.01)  His updated medication list for this problem includes:    Plavix 75 Mg Tabs (Clopidogrel bisulfate) .Marland Kitchen... Take one tablet by mouth daily    Ramipril 10 Mg Caps (Ramipril) .Marland Kitchen... Take one capsule by mouth daily    Aspirin Ec 325 Mg Tbec (Aspirin) .Marland Kitchen... Take one tablet by mouth daily    Amlodipine Besylate 10 Mg Tabs (Amlodipine besylate) .Marland Kitchen... Take one tablet by mouth daily    Nitroglycerin 0.4 Mg Subl (Nitroglycerin) ..... One tablet under tongue every 5 minutes as needed for chest pain---may repeat times three The patient has coronary disease.  It is stable per his most recent catheter.  No change in therapy today.  I will see him back in 3 months and carefully follow his symptomatology.  We will decide if we have to change his meds over time.  Patient Instructions: 1)  Your physician recommends that you schedule a follow-up appointment in: 3 months 2)  Your physician recommends that you continue on your current medications as directed. Please refer to the Current Medication list given to you today.

## 2010-02-15 NOTE — Miscellaneous (Signed)
  Clinical Lists Changes  Observations: Added new observation of PAST MED HX: GERD Irritable bowel History adenomatous polyp Diverticulosis History fatty liver Diabetes Hypercholesterolemia EF  normal... echo... September, 2008  /    63% ...nuclear...12/2007 Atrial bigeminy in the past Long-term Plavix CAD. 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  /    Taxus stent to the LAD 2004.  /   Cypher Stent proximal edge  LAD and Cypher stent  mid circumflex May 2006 /   nuclear stress 12/2007..normal..EF  63%  /   nuclear.. September 12, 2009... EF 62%... inferior thinning and very mild ischemia  inferior and apex  /  catheterization.. September 29, 2009.... EF 60%.. mild-to-moderate diffuse RCA disease.... moderate (50-60%)discrete LAD stenosis before the stent in the proximal LAD - medical therapy recommended Hypertension Tingling in hands ... resolved Left hand numbness and slight tingling different from prior sensation in his hands BPH Colon Polyps (10/17/2009 12:50) Added new observation of PRIMARY MD: Guerry Bruin, MD (10/17/2009 12:50)       Past History:  Past Medical History: GERD Irritable bowel History adenomatous polyp Diverticulosis History fatty liver Diabetes Hypercholesterolemia EF  normal... echo... September, 2008  /    63% ...nuclear...12/2007 Atrial bigeminy in the past Long-term Plavix CAD. 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  /    Taxus stent to the LAD 2004.  /   Cypher Stent proximal edge  LAD and Cypher stent  mid circumflex May 2006 /   nuclear stress 12/2007..normal..EF  63%  /   nuclear.. September 12, 2009... EF 62%... inferior thinning and very mild ischemia  inferior and apex  /  catheterization.. September 29, 2009.... EF 60%.. mild-to-moderate diffuse RCA disease.... moderate (50-60%)discrete LAD stenosis before the stent in the proximal LAD - medical therapy recommended Hypertension Tingling in hands ... resolved Left hand numbness and slight  tingling different from prior sensation in his hands BPH Colon Polyps

## 2010-02-15 NOTE — Procedures (Signed)
Summary: Upper Endoscopy  Patient: [name] Ray Note: All result statuses are Final unless otherwise noted.  Tests: (1) Upper Endoscopy (EGD)   EGD Upper Endoscopy       DONE     [facility]     [address]     [place], [place]  [address]           ENDOSCOPY PROCEDURE REPORT           PATIENT:  [name] Ray, [name] Ray  MR#:  [id number]     BIRTHDATE:  September 17, 1933, 76 yrs. old  GENDER:  male           ENDOSCOPIST:  Wilhemina Bonito. Eda Keys, MD     Referred by:  Guerry Bruin, M.D.           PROCEDURE DATE:  11/23/2009     PROCEDURE:  EGD with biopsy, 43239     ASA CLASS:  Class III     INDICATIONS:  dyspepsia (symptoms have improved since office     visit), hemoccult positive stool           MEDICATIONS:   There was residual sedation effect present from     prior procedure., Fentanyl 25 mcg IV, Versed 2 mg IV     TOPICAL ANESTHETIC:  Exactacain Spray           DESCRIPTION OF PROCEDURE:   After the risks benefits and     alternatives of the procedure were thoroughly explained, informed     consent was obtained.  The [facility] [id number] endoscope was     introduced through the mouth and advanced to the third portion of     the duodenum, without limitations.  The instrument was slowly     withdrawn as the mucosa was fully examined.     <<PROCEDUREIMAGES>>           The esophagus and gastroesophageal junction were completely normal     in appearance.  The stomach was entered and closely examined. The     antrum, angularis, and lesser curvature were well visualized,     including a retroflexed view of the cardia and fundus. The stomach     wall was normally distensable. The scope passed easily through the     pylorus into the duodenum.  Multiple erosions were found in the     bulb of the duodenum. Clo bx taken. Post bulbar duodenum was     normal.   Retroflexed views revealed no abnormalities.    The     scope was then withdrawn from the patient and the procedure     completed.          COMPLICATIONS:  None           ENDOSCOPIC IMPRESSION:     1) Normal esophagus     2) Normal stomach     3) Erosions, multiple in the bulb of duodenum (likely cause for     heme + stool)           RECOMMENDATIONS:     1) Rx CLO if positive     2) Continue omeprazole daily     3) follow-up prn           ______________________________     Wilhemina Bonito. Eda Keys, MD           CC:  The Patient, Guerry Bruin, MD, Luis Abed, MD  n.     eSIGNED:   Wilhemina Bonito. Eda Keys at 11/23/2009 02:22 PM           Darrin Luis, [id number]  Note: An exclamation mark (!) indicates a result that was not dispersed into the flowsheet. Document Creation Date: 11/23/2009 2:23 PM _______________________________________________________________________  (1) Order result status: Final Collection or observation date-time: 11/23/2009 14:05 Requested date-time:  Receipt date-time:  Reported date-time:  Referring Physician:   Ordering Physician: Fransico Setters [phone]) Specimen Source:  Source: Launa Grill Order Number: [id number] Lab site:

## 2010-02-15 NOTE — Assessment & Plan Note (Signed)
Summary: 3wk f/u sl      Allergies Added: ! LIPITOR  Visit Type:  Follow-up Primary Provider:  Guerry Bruin, MD  CC:  CAD.  History of Present Illness: The patient is seen for cardiology followup.  I saw him last September 05, 2009.  At that time he had been having some chest discomfort.  His last intervention was in 2006.  I asked him to decrease his activities and to proceed with a nuclear stress study.  A stress study was done September 12, 2009.  Ejection fraction 62%.  There was mild ischemia in the inferior wall and the apex.  I am seeing for followup today.  With reduced activities he has had less discomfort.  Current Medications (verified): 1)  Plavix 75 Mg Tabs (Clopidogrel Bisulfate) .... Take One Tablet By Mouth Daily 2)  Ramipril 10 Mg Caps (Ramipril) .... Take One Capsule By Mouth Daily 3)  Aspirin Ec 325 Mg Tbec (Aspirin) .... Take One Tablet By Mouth Daily 4)  Simvastatin 20 Mg Tabs (Simvastatin) .... Take One Tablet By Mouth Daily At Bedtime 5)  Amlodipine Besylate 10 Mg Tabs (Amlodipine Besylate) .... Take One Tablet By Mouth Daily 6)  Calcium Carbonate-Vitamin D 600-400 Mg-Unit  Tabs (Calcium Carbonate-Vitamin D) .... Take 1 Tablet By Mouth Once A Day 7)  Omeprazole 20 Mg  Cpdr (Omeprazole) .Marland Kitchen.. 1 Each Day 30 Minutes Before Meal 8)  Glucosamine 1500 Complex  Caps (Glucosamine-Chondroit-Vit C-Mn) .... Take 1 Tablet By Mouth Once A Day 9)  Janumet 50-500 Mg Tabs (Sitagliptin-Metformin Hcl) .... Take 1 Tablet By Mouth Once A Day 10)  Nitroglycerin 0.4 Mg Subl (Nitroglycerin) .... One Tablet Under Tongue Every 5 Minutes As Needed For Chest Pain---May Repeat Times Three 11)  Loradamed 10 Mg Tabs (Loratadine) .... As Needed 12)  Fish Oil   Oil (Fish Oil) .... 1000mg  Once Daily 13)  Vitamin D 400 Unit  Tabs (Cholecalciferol) .... Once Daily  Allergies (verified): 1)  ! Crestor (Rosuvastatin Calcium) 2)  ! Lipitor  Past History:  Past Medical History: GERD Irritable  bowel History adenomatous polyp Diverticulosis History fatty liver Diabetes Hypercholesterolemia EF  normal... echo... September, 2008  /    63% ...nuclear...12/2007 Atrial bigeminy in the past Long-term Plavix CAD. 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  /    Taxus stent to the LAD 2004.  /   Cypher Stent proximal edge  LAD and Cypher stent  mid circumflex May 2006 /   nuclear stress 12/2007..normal..EF  63%  /   nuclear.. September 12, 2009... EF 62%... inferior thinning and very mild ischemia  inferior and apex Hypertension Tingling in hands ... resolved Left hand numbness and slight tingling different from prior sensation in his hands  Review of Systems       Patient denies fever, chills, headache, sweats, rash, change in vision, change in hearing, chest pain, cough, nausea vomiting, urinary symptoms.  All other systems are reviewed and are negative.  Vital Signs:  Patient profile:   77 year old male Height:      66 inches Weight:      227 pounds BMI:     36.77 Pulse rate:   75 / minute BP sitting:   130 / 64  (left arm) Cuff size:   large  Vitals Entered By: Hardin Negus, RMA (September 25, 2009 8:47 AM)  Physical Exam  General:  patient is stable in general.  He is overweight. Head:  head is atraumatic. Eyes:  no  xanthelasma. Neck:  no jugular venous distention. Chest Wall:  no chest wall tenderness. Lungs:  lungs are clear.  Respiratory effort is nonlabored. Heart:  cardiac exam reveals an S1-S2.  No clicks or significant murmurs. Abdomen:  abdomen is soft. Msk:  no musculoskeletal deformities. Extremities:  no peripheral edema. Skin:  no skin rashes. Psych:  patient is oriented to person time and place.  Affect is normal.   Impression & Recommendations:  Problem # 1:  * LEFT HAND DISCOMFORT... 2011 The patient continues to have some mild discomfort in his left hand.  I am still not convinced this represents ischemia.  Problem # 2:  OVERWEIGHT  (ICD-278.02) Weight loss will be important for him.  Problem # 3:  DYSLIPIDEMIA (ICD-272.4)  His updated medication list for this problem includes:    Simvastatin 20 Mg Tabs (Simvastatin) .Marland Kitchen... Take one tablet by mouth daily at bedtime Lipids are being treated.  Problem # 4:  HYPERTENSION, BENIGN (ICD-401.1)  His updated medication list for this problem includes:    Ramipril 10 Mg Caps (Ramipril) .Marland Kitchen... Take one capsule by mouth daily    Aspirin Ec 325 Mg Tbec (Aspirin) .Marland Kitchen... Take one tablet by mouth daily    Amlodipine Besylate 10 Mg Tabs (Amlodipine besylate) .Marland Kitchen... Take one tablet by mouth daily Blood pressure control today no change in therapy.  Problem # 5:  CAD, NATIVE VESSEL (ICD-414.01)  His updated medication list for this problem includes:    Plavix 75 Mg Tabs (Clopidogrel bisulfate) .Marland Kitchen... Take one tablet by mouth daily    Ramipril 10 Mg Caps (Ramipril) .Marland Kitchen... Take one capsule by mouth daily    Aspirin Ec 325 Mg Tbec (Aspirin) .Marland Kitchen... Take one tablet by mouth daily    Amlodipine Besylate 10 Mg Tabs (Amlodipine besylate) .Marland Kitchen... Take one tablet by mouth daily    Nitroglycerin 0.4 Mg Subl (Nitroglycerin) ..... One tablet under tongue every 5 minutes as needed for chest pain---may repeat times three the patient has decreased symptoms since reducing his activities.  This is affecting his lifestyle significantly as he loves to work outside.  His nuclear scan shows mild ischemia.  He has significant disease and has had multiple interventions in the past.  I feel it is most appropriate to proceed with repeat cardiac catheterization to reassess his anatomy.  This will help determine if he needs an intervention, if I need to change his medicines significantly, and if I can return him to his active work.  This is being arranged..  Other Orders: Cardiac Catheterization (Cardiac Cath) TLB-BMP (Basic Metabolic Panel-BMET) (80048-METABOL) TLB-CBC Platelet - w/Differential (85025-CBCD) TLB-PT  (Protime) (85610-PTP)  Patient Instructions: 1)  Labs today 2)  Your physician has requested that you have a cardiac catheterization.  Cardiac catheterization is used to diagnose and/or treat various heart conditions. Doctors may recommend this procedure for a number of different reasons. The most common reason is to evaluate chest pain. Chest pain can be a symptom of coronary artery disease (CAD), and cardiac catheterization can show whether plaque is narrowing or blocking your heart's arteries. This procedure is also used to evaluate the valves, as well as measure the blood flow and oxygen levels in different parts of your heart.  For further information please visit [email or web address]  Please follow instruction sheet, as given. 3)  Follow up in 6 weeks

## 2010-02-15 NOTE — Consult Note (Signed)
Summary: [facility]   Imported By: Sherian Rein 10/20/2009 14:45:55  _____________________________________________________________________  External Attachment:    Type:   Image     Comment:   External Document

## 2010-02-15 NOTE — Letter (Signed)
Summary: [facility]  [facility]   Imported By: Marylou Mccoy 09/19/2009 13:00:10  _____________________________________________________________________  External Attachment:    Type:   Image     Comment:   External Document

## 2010-02-15 NOTE — Letter (Signed)
Summary: Cardiac Catheterization Instructions- [facility]  [facility], Main Office  [address]   [place], [place] [address]   Phone: [phone]  Fax: [phone]     09/25/2009 MRN: [id number]  CLANCEY WELTON [address] [place], [place]  [address]  Dear Mr. Outland,   You are scheduled for a Cardiac Catheterization on Friday 09/29/09 with Dr. Clifton James  Please arrive to the 1st floor of the Heart and Vascular Center at [facility] at 6:30 am / pm on the day of your procedure. Please do not arrive before 6:30 a.m. Call the Heart and Vascular Center at [phone] if you are unable to make your appointmnet. The Code to get into the parking garage under the building is 0020. Take the elevators to the 1st floor. You must have someone to drive you home. Someone must be with you for the first 24 hours after you arrive home. Please wear clothes that are easy to get on and off and wear slip-on shoes. Do not eat or drink after midnight except water with your medications that morning. Bring all your medications and current insurance cards with you.  _X__ DO NOT take these medications before your procedure:  DO NOT take Janumet the day of and 48 hours procedure  _X__ Make sure you take your aspirin.  ___ You may take ALL of your medications with water that morning. ________________________________________________________________________________________________________________________________  ___ DO NOT take ANY medications before your procedure.  ___ Pre-med instructions:  ________________________________________________________________________________________________________________________________  The usual length of stay after your procedure is 2 to 3 hours. This can vary.  If you have any questions, please call the office at the number listed above.   Meredith Staggers, RN

## 2010-02-15 NOTE — Letter (Signed)
Summary: New Patient letter  [facility]  [address] [place], [place] [address]   Phone: [phone]  Fax: [phone]       09/01/2009 MRN: [id number]  [name] Ray [address] [place], [place]  [address]  Dear Mr. Throne,  Welcome to the Gastroenterology Division at [facility].    You are scheduled to see Dr.  Marina Goodell on 10-19-09 at 9:30am on the 3rd floor at [facility], [address]  We ask that you try to arrive at our office 15 minutes prior to your appointment time to allow for check-in.  We would like you to complete the enclosed self-administered evaluation form prior to your visit and bring it with you on the day of your appointment.  We will review it with you.  Also, please bring a complete list of all your medications or, if you prefer, bring the medication bottles and we will list them.  Please bring your insurance card so that we may make a copy of it.  If your insurance requires a referral to see a specialist, please bring your referral form from your primary care physician.  Co-payments are due at the time of your visit and may be paid by cash, check or credit card.     Your office visit will consist of a consult with your physician (includes a physical exam), any laboratory testing he/she may order, scheduling of any necessary diagnostic testing (e.g. x-ray, ultrasound, CT-scan), and scheduling of a procedure (e.g. Endoscopy, Colonoscopy) if required.  Please allow enough time on your schedule to allow for any/all of these possibilities.    If you cannot keep your appointment, please call [phone] to cancel or reschedule prior to your appointment date.  This allows [place] the opportunity to schedule an appointment for another patient in need of care.  If you do not cancel or reschedule by 5 p.m. the business day prior to your appointment date, you will be charged a $50.00 late cancellation/no-show fee.    Thank you for choosing [facility] for your medical needs.  We appreciate the opportunity to care for you.  Please visit [place] at our website  to learn more about our practice.                     Sincerely,                                                             The Gastroenterology Division

## 2010-02-15 NOTE — Assessment & Plan Note (Addendum)
Summary: 3 month rov/sl      Allergies Added:   Visit Type:  Follow-up Referring Provider:  Gaspar Garbe, MD Primary Provider:  Guerry Bruin, MD   History of Present Illness: The patient is seen for followup of coronary artery disease.  I saw him last October 19, 2009.  He had undergone catheterization in September, 2011.  He had mild-to-moderate diffuse disease of the right coronary artery.  There was moderate discrete LAD stenosis before the stent.  These areas were not flow limiting and intervention was not indicated.  He has seen Dr. Marina Goodell for GI followup since then.  It is recommended that he remain on omeprazole for his GERD.  He's not having any significant chest pain.  Current Medications (verified): 1)  Plavix 75 Mg Tabs (Clopidogrel Bisulfate) .... Take One Tablet By Mouth Daily 2)  Ramipril 10 Mg Caps (Ramipril) .... Take One Capsule By Mouth Daily 3)  Aspirin Ec 325 Mg Tbec (Aspirin) .... Take One Tablet By Mouth Daily 4)  Simvastatin 20 Mg Tabs (Simvastatin) .... Take One Tablet By Mouth Daily At Bedtime 5)  Amlodipine Besylate 10 Mg Tabs (Amlodipine Besylate) .... Take One Tablet By Mouth Daily 6)  Omeprazole 20 Mg  Cpdr (Omeprazole) .Marland Kitchen.. 1 Each Day 30 Minutes Before Meal 7)  Glucosamine 1500 Complex  Caps (Glucosamine-Chondroit-Vit C-Mn) .... Take 1 Tablet By Mouth Once A Day 8)  Janumet 50-500 Mg Tabs (Sitagliptin-Metformin Hcl) .... Take 1 Tablet By Mouth Once A Day 9)  Nitroglycerin 0.4 Mg Subl (Nitroglycerin) .... One Tablet Under Tongue Every 5 Minutes As Needed For Chest Pain---May Repeat Times Three 10)  Loradamed 10 Mg Tabs (Loratadine) .... As Needed 11)  Gaviscon Extra Relief Formula 160-105 Mg Chew (Alum Hydroxide-Mag Carbonate) .... As Needed  Allergies (verified): 1)  ! Crestor (Rosuvastatin Calcium) 2)  ! Lipitor  Past History:  Past Medical History: GERD Irritable bowel History adenomatous polyp Diverticulosis History fatty  liver Diabetes Hypercholesterolemia EF  normal... echo... September, 2008  /    63% ...nuclear...12/2007 Atrial bigeminy in the past Long-term Plavix CAD. 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  /    Taxus stent to the LAD 2004.  /   Cypher Stent proximal edge  LAD and Cypher stent  mid circumflex May 2006 /   nuclear stress 12/2007..normal..EF  63%  /   nuclear.. September 12, 2009... EF 62%... inferior thinning and very mild ischemia  inferior and apex  /  catheterization.. September 29, 2009.... EF 60%.. mild-to-moderate diffuse RCA disease.... moderate (50-60%)discrete LAD stenosis before the stent in the proximal LAD - medical therapy recommended Hypertension Tingling in hands ... resolved Left hand numbness and slight tingling different from prior sensation in his hands BPH Colon Polyps Question soft right carotid bruit  Review of Systems       Patient denies fever, chills, headache, sweats, rash, change in vision, change in hearing, chest pain, cough, nausea vomiting, urinary symptoms.  All other systems are reviewed and are negative.  Vital Signs:  Patient profile:   77 year old male Height:      66 inches Weight:      228 pounds BMI:     36.93 Pulse rate:   70 / minute BP sitting:   122 / 62  (left arm) Cuff size:   regular  Vitals Entered By: Hardin Negus, RMA (January 18, 2010 8:37 AM)  Physical Exam  General:  Patient is overweight but stable. Eyes:  no  xanthelasma. Neck:  no jugular venous distention.  There is question of a soft right carotid bruit. Lungs:  lungs are clear.  Respiratory effort is nonlabored. Heart:  cardiac exam reveals an S1-S2.  No clicks or significant murmurs. Abdomen:  abdomen is soft. Extremities:  no peripheral edema. Psych:  patient is oriented to person time and place.  Affect is normal.   Impression & Recommendations:  Problem # 1:  * QUESTION SOFT RIGHT CAROTID BRUIT The patient has documented vascular disease.  There is  question of a soft right carotid bruit.  Carotid Doppler will be arranged and I will be in touch with him with the information  Problem # 2:  OVERWEIGHT (ICD-278.02) The patient is encouraged to continue to try to lose weight.  Problem # 3:  DYSLIPIDEMIA (ICD-272.4)  His updated medication list for this problem includes:    Simvastatin 20 Mg Tabs (Simvastatin) .Marland Kitchen... Take one tablet by mouth daily at bedtime The patient is on simvastatin.  His lipids are followed by his primary physician.  Problem # 4:  GERD (ICD-530.81)  His updated medication list for this problem includes:    Omeprazole 20 Mg Cpdr (Omeprazole) .Marland Kitchen... 1 each day 30 minutes before meal    Gaviscon Extra Relief Formula 160-105 Mg Chew (Alum hydroxide-mag carbonate) .Marland Kitchen... As needed The patient continues to have symptoms of GERD but they are stable when he takes his medicines.  He is followed by GI for this.  Problem # 5:  CAD, NATIVE VESSEL (ICD-414.01)  His updated medication list for this problem includes:    Plavix 75 Mg Tabs (Clopidogrel bisulfate) .Marland Kitchen... Take one tablet by mouth daily    Ramipril 10 Mg Caps (Ramipril) .Marland Kitchen... Take one capsule by mouth daily    Aspirin Ec 325 Mg Tbec (Aspirin) .Marland Kitchen... Take one tablet by mouth daily    Amlodipine Besylate 10 Mg Tabs (Amlodipine besylate) .Marland Kitchen... Take one tablet by mouth daily    Nitroglycerin 0.4 Mg Subl (Nitroglycerin) ..... One tablet under tongue every 5 minutes as needed for chest pain---may repeat times three Coronary disease is stable.  No further workup at this time.  No change in therapy.  Problem # 6:  HYPERTENSION, BENIGN (ICD-401.1)  His updated medication list for this problem includes:    Ramipril 10 Mg Caps (Ramipril) .Marland Kitchen... Take one capsule by mouth daily    Aspirin Ec 325 Mg Tbec (Aspirin) .Marland Kitchen... Take one tablet by mouth daily    Amlodipine Besylate 10 Mg Tabs (Amlodipine besylate) .Marland Kitchen... Take one tablet by mouth daily Blood pressure is controlled.  No change  in therapy.  I will see him for cardiology follow up in 6 months.  Patient Instructions: 1)  Your physician has requested that you have a carotid duplex. This test is an ultrasound of the carotid arteries in your neck. It looks at blood flow through these arteries that supply the brain with blood. Allow one hour for this exam. There are no restrictions or special instructions. 2)  Your physician wants you to follow-up in:  6 months.  You will receive a reminder letter in the mail two months in advance. If you don't receive a letter, please call our office to schedule the follow-up appointment.  Appended Document: 3 month rov/sl Mild narrowing. Follow.

## 2010-02-15 NOTE — Assessment & Plan Note (Signed)
Summary: rov chest pain  Medications Added FISH OIL   OIL (FISH OIL) 1000mg  once daily VITAMIN D 400 UNIT  TABS (CHOLECALCIFEROL) once daily GLUCOSAMINE-CHONDROITIN   CAPS (GLUCOSAMINE-CHONDROIT-VIT C-MN) once daily      Allergies Added:   Visit Type:  add-on for chest pain Primary Tyah Acord:  Guerry Bruin, MD  CC:  chest pain.  History of Present Illness: The patient is seen for the evaluation of chest pain and left hand tingling.  He has known coronary disease.  He mentioned recently to Dr. Wylene Simmer that he had been having some chest pain.  He's had some numbness and tingling in his left hand.  This concerned him along with the chest discomfort.  This sensation in his hand does not sound like radiation of ischemic pain to me.  He is active.  The chest discomfort that he has is random.  His last intervention was to 006.  He had a nuclear stress study in December 2 009.  This revealed no ischemia.  Current Medications (verified): 1)  Plavix 75 Mg Tabs (Clopidogrel Bisulfate) .... Take One Tablet By Mouth Daily 2)  Ramipril 10 Mg Caps (Ramipril) .... Take One Capsule By Mouth Daily 3)  Aspirin Ec 325 Mg Tbec (Aspirin) .... Take One Tablet By Mouth Daily 4)  Simvastatin 20 Mg Tabs (Simvastatin) .... Take One Tablet By Mouth Daily At Bedtime 5)  Amlodipine Besylate 10 Mg Tabs (Amlodipine Besylate) .... Take One Tablet By Mouth Daily 6)  Calcium Carbonate-Vitamin D 600-400 Mg-Unit  Tabs (Calcium Carbonate-Vitamin D) .... Take 1 Tablet By Mouth Once A Day 7)  Omeprazole 20 Mg  Cpdr (Omeprazole) .Marland Kitchen.. 1 Each Day 30 Minutes Before Meal 8)  Glucosamine 1500 Complex  Caps (Glucosamine-Chondroit-Vit C-Mn) .... Take 1 Tablet By Mouth Once A Day 9)  Janumet 50-500 Mg Tabs (Sitagliptin-Metformin Hcl) .... Take 1 Tablet By Mouth Once A Day 10)  Nitroglycerin 0.4 Mg Subl (Nitroglycerin) .... One Tablet Under Tongue Every 5 Minutes As Needed For Chest Pain---May Repeat Times Three 11)  Loradamed 10 Mg  Tabs (Loratadine) .... As Needed 12)  Fish Oil   Oil (Fish Oil) .... 1000mg  Once Daily 13)  Vitamin D 400 Unit  Tabs (Cholecalciferol) .... Once Daily 14)  Glucosamine-Chondroitin   Caps (Glucosamine-Chondroit-Vit C-Mn) .... Once Daily  Allergies (verified): 1)  ! Crestor (Rosuvastatin Calcium)  Past History:  Past Medical History: GERD Irritable bowel History adenomatous polyp Diverticulosis History fatty liver Diabetes Hypercholesterolemia EF  normal... echo... September, 2008  /    63% ...nuclear...12/2007 Atrial bigeminy in the past Long-term Plavix CAD. 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  /    Taxus stent to the LAD 2004.  /   Cypher Stent proximal edge  LAD and Cypher stent  mid circumflex May 2006 /   nuclear stress 12/2007..normal..EF  63% Hypertension Tingling in hands ... resolved Left hand numbness and slight tingling different from prior sensation in his hands  Review of Systems       Patient denies fever, chills, headache, sweats, rash, change in vision, change in hearing, chest pain, cough, nausea vomiting, urinary symptoms.  All of the systems are reviewed and are negative  Vital Signs:  Patient profile:   77 year old male Height:      66 inches Weight:      226 pounds BMI:     36.61 Pulse rate:   70 / minute BP sitting:   110 / 60  (right arm) Cuff size:  regular  Vitals Entered By: Hardin Negus, RMA (September 05, 2009 11:50 AM)  Physical Exam  General:  patient is overweight but stable. Head:  head is atraumatic. Eyes:  no xanthelasma. Neck:  no jugular venous stent. Chest Wall:  no chest wall tenderness. Lungs:  lungs are clear.  Respiratory effort is not labored. Heart:  cardiac exam reveals S1-S2.  No clicks or significant murmurs. Abdomen:  abdomen is soft. Msk:  no musculoskeletal deformities. Extremities:  no peripheral edema. Skin:  no skin rashes. Psych:  patient is oriented to person time and place.  Affect is  normal.   Impression & Recommendations:  Problem # 1:  * LEFT HAND DISCOMFORT... 2011 At this  point I'm not convinced that his left hand discomfort is ischemic in origin.  Problem # 2:  OVERWEIGHT (ICD-278.02) Weight loss would certainly be helpful.  Problem # 3:  GERD (ICD-530.81)  His updated medication list for this problem includes:    Omeprazole 20 Mg Cpdr (Omeprazole) .Marland Kitchen... 1 each day 30 minutes before meal At this point I have chosen not to try to change his omeprazole as it relates to Plavix therapy.  Problem # 4:  HYPERTENSION, BENIGN (ICD-401.1)  His updated medication list for this problem includes:    Ramipril 10 Mg Caps (Ramipril) .Marland Kitchen... Take one capsule by mouth daily    Aspirin Ec 325 Mg Tbec (Aspirin) .Marland Kitchen... Take one tablet by mouth daily    Amlodipine Besylate 10 Mg Tabs (Amlodipine besylate) .Marland Kitchen... Take one tablet by mouth daily Blood pressure control.  No change in therapy.  Problem # 5:  CAD, NATIVE VESSEL (ICD-414.01)  His updated medication list for this problem includes:    Plavix 75 Mg Tabs (Clopidogrel bisulfate) .Marland Kitchen... Take one tablet by mouth daily    Ramipril 10 Mg Caps (Ramipril) .Marland Kitchen... Take one capsule by mouth daily    Aspirin Ec 325 Mg Tbec (Aspirin) .Marland Kitchen... Take one tablet by mouth daily    Amlodipine Besylate 10 Mg Tabs (Amlodipine besylate) .Marland Kitchen... Take one tablet by mouth daily    Nitroglycerin 0.4 Mg Subl (Nitroglycerin) ..... One tablet under tongue every 5 minutes as needed for chest pain---may repeat times three The patient has been having some chest pain.  He has known significant disease.  He does not appear to be unstable today.  He does followup exercise testing.  We will arrange for a Myoview scan.  All then see him back for followup.  EKG is done today and reviewed by me.  Her sinus bradycardia but no other change.  I've encouraged him to decrease his outside activities until we have more information  Orders: EKG w/ Interpretation  (93000) Nuclear Stress Test (Nuc Stress Test)  Patient Instructions: 1)  Your physician has requested that you have a lexiscan myoview.  For further information please visit [email or web address]  Please follow instruction sheet, as given. 2)  Follow up in 3 weeks

## 2010-02-15 NOTE — Progress Notes (Signed)
Summary: appt   Phone Note From Other Clinic   Caller: Nurse Malachi Bonds Summary of Call: Per Malachi Bonds Dr Wylene Simmer ofc pt having chest pain, SOB, numbness in fingers and left hand, tingling in left arm. want pt seen within the next week. ofc [phone] Initial call taken by: Edman Circle,  August 31, 2009 9:32 AM  Follow-up for Phone Call        spoke w/Gloria pt has been having some cp, not active today, will sch pt to see Dr Myrtis Ser Tue 8/23 at 11:45, I have called and spoke w/pt he is aware advised if symptoms return to go to ER or call our office back he is agreeable Meredith Staggers, RN  August 31, 2009 11:12 AM

## 2010-02-15 NOTE — Procedures (Signed)
Summary: Colonoscopy   Colonoscopy  Procedure date:  12/22/2001  Findings:      [facility]  Results: Diverticulosis.       Results: Polyp.    Procedures Next Due Date:    Colonoscopy: 12/2004 Patient Name: [name], Ray MRN:  Procedure Procedures: Colonoscopy CPT: 04540.    with polypectomy. CPT: [id number].  Personnel: Endoscopist: Wilhemina Bonito. Marina Goodell, MD.  Referred By: Guerry Bruin, MD.  Exam Location: Exam performed in Outpatient Clinic. Outpatient  Patient Consent: Procedure, Alternatives, Risks and Benefits discussed, consent obtained, from patient. Consent was obtained by the RN.  Indications  Surveillance of: Adenomatous Polyp(s). This is an initial surveillance exam. Initial polypectomy was performed in 2000. in May. 3 or more Polyps were found at Index Exam. Largest polyp removed was 1 to 5 mm. Prior polyp located in both proximal and distal colon. Pathology of worst  polyp: tubular adenoma.  History  Pre-Exam Physical: Performed Dec 22, 2001. Entire physical exam was normal.  Exam Exam: Extent of exam reached: Cecum, extent intended: Cecum.  The cecum was identified by appendiceal orifice and IC valve. Patient position: on left side. Colon retroflexion performed. Images taken. ASA Classification: II. Tolerance: excellent.  Monitoring: Pulse and BP monitoring, Oximetry used. Supplemental O2 given.  Colon Prep Used Golytely for colon prep. Prep results: good.  Sedation Meds: Patient assessed and found to be appropriate for moderate (conscious) sedation. Fentanyl 50 mcg. given IV. Versed 5 mg. given IV.  Findings NORMAL EXAM: Ascending Colon to Rectum.  POLYP: Cecum, Maximum size: 3 mm. sessile polyp. Procedure:  snare with cautery, removed, ICD9: Colon Polyps: 211.3. Comments: polyp destroyed upon removal.  - DIVERTICULOSIS: Sigmoid Colon. ICD9: Diverticulosis, Colon: 562.10. Comments: mild.   Assessment Abnormal examination,  see findings above.  Diagnoses: 211.3: Colon Polyps.  562.10: Diverticulosis, Colon.   Events  Unplanned Interventions: No intervention was required.  Unplanned Events: There were no complications. Plans Disposition: After procedure patient sent to recovery. After recovery patient sent home.  Scheduling/Referral: Colonoscopy, to Wilhemina Bonito. Marina Goodell, MD, in 3 years.,    This report was created from the original endoscopy report, which was reviewed and signed by the above listed endoscopist.   cc:  Guerry Bruin, MD

## 2010-02-15 NOTE — Assessment & Plan Note (Signed)
Summary: Cardiology Nuclear Testing  Nuclear Med Background Indications for Stress Test: Evaluation for Ischemia, Stent Patency   History: Angioplasty, Echo, Heart Catheterization, Myocardial Infarction, Myocardial Perfusion Study, Stents   Symptoms: Chest Pain, Dizziness, DOE, Fatigue, Light-Headedness, SOB    Nuclear Pre-Procedure Cardiac Risk Factors: Hypertension, Lipids, NIDDM Caffeine/Decaff Intake: None NPO After: 8:00 PM Lungs: clear IV 0.9% NS with Angio Cath: 22g     IV Site: (R) AC IV Started by: Irean Hong, RN Chest Size (in) 46     Height (in): 66 Weight (lb): 223 BMI: 36.12 Tech Comments: FBS=144 at 7:00am.  Nuclear Med Study 1 or 2 day study:  1 day     Stress Test Type:  Lexiscan Reading MD:  Olga Millers, MD     Referring MD:  J.Katz Resting Radionuclide:  Technetium 80m Tetrofosmin     Resting Radionuclide Dose:  11 mCi  Stress Radionuclide:  Technetium 101m Tetrofosmin     Stress Radionuclide Dose:  33 mCi   Stress Protocol      Max HR:  91 bpm Max Systolic BP: 140 mm Hg     METS: 1.60 Rate Pressure Product:  91478  Lexiscan: 0.4 mg   Stress Test Technologist:  Milana Na, EMT-P     Nuclear Technologist:  Domenic Polite, CNMT  Rest Procedure  Myocardial perfusion imaging was performed at rest 45 minutes following the intravenous administration of Technetium 107m Tetrofosmin.  Stress Procedure  The patient received IV Lexiscan 0.4 mg over 15-seconds.  Technetium 80m Tetrofosmin injected at 30-seconds.  There were no significant changes with infusion.  Quantitative spect images were obtained after a 45 minute delay.  QPS Raw Data Images:  Acquisition technically good; normal left ventricular size. Stress Images:  There is decreased uptake in the inferior wall and apex. Rest Images:  There is decreased uptake in the inferior wall, less prominent compared to the stress images. Subtraction (SDS):  These findings are consistent with inferior  thinning and very mild ischemia in the inferior wall and apex. Transient Ischemic Dilatation:  0.98  (Normal <1.22)  Lung/Heart Ratio:  0.33  (Normal <0.45)  Quantitative Gated Spect Images QGS EDV:  83 ml QGS ESV:  32 ml QGS EF:  62 % QGS cine images:  Normal LV function.   Overall Impression  Exercise Capacity: Lexiscan with no exercise. BP Response: Normal blood pressure response. Clinical Symptoms: There is  chest pain ECG Impression: No significant ST segment change suggestive of ischemia. Overall Impression: Abnormal lexiscan nuclear study with mild inferior thinning and mild ischemia in the inferior wall and apex.  Appended Document: Cardiology Nuclear Testing Stable. I"ll review with him at f/u visit  Appended Document: Cardiology Nuclear Testing discussed w/pt at [facility] 9/12

## 2010-02-15 NOTE — Miscellaneous (Signed)
  Clinical Lists Changes  Observations: Added new observation of PAST MED HX: GERD Irritable bowel History adenomatous polyp Diverticulosis History fatty liver Diabetes Hypercholesterolemia EF  normal... echo... September, 2008  /    63% ...nuclear...12/2007 Atrial bigeminy in the past Long-term Plavix CAD. 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  /    Taxus stent to the LAD 2004.  /   Cypher Stent proximal edge  LAD and Cypher stent  mid circumflex May 2006 /   nuclear stress 12/2007..normal..EF  63% Hypertension Tingling in hands ... resolved (09/05/2009 8:27) Added new observation of PRIMARY MD: Guerry Bruin, MD (09/05/2009 8:27)       Past History:  Past Medical History: GERD Irritable bowel History adenomatous polyp Diverticulosis History fatty liver Diabetes Hypercholesterolemia EF  normal... echo... September, 2008  /    63% ...nuclear...12/2007 Atrial bigeminy in the past Long-term Plavix CAD. 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  /    Taxus stent to the LAD 2004.  /   Cypher Stent proximal edge  LAD and Cypher stent  mid circumflex May 2006 /   nuclear stress 12/2007..normal..EF  63% Hypertension Tingling in hands ... resolved

## 2010-02-15 NOTE — Assessment & Plan Note (Signed)
Summary: POSITIVE STOOL CARDS/YF      History of Present Illness Visit Type: consult  Primary GI MD: Yancey Flemings MD Primary Provider: Gaspar Garbe, MD Requesting Provider: Gaspar Garbe, MD Chief Complaint: Upper abd pain, GERD, chest pain, and positive stool cards History of Present Illness:   77 y.o. WM w/ Htn, DM, CAD w/ prior MI and coronary artery stents (on Plavix and Asa), hyperlipidemia, adenomatous colon polyps and GERD. Sent now regarding heme + stool (hemosure 08-31-09 as part of routine physical). Recent hg normal at 14.3 in Aug and Sept. Prior colonoscopy in 2003 w/ small adenoma. F/U in 2007 was negative for polyps (diverticulosis present). Recent problems w/ chest pain evaluated w/ stress test (abnormal) and cath (non-flow limiting stenosis for which medical therapy was recommended). GI ROS remarkable for GERD symptoms and some epigastric discomfort, sometimes affected by meals. He takes omeprazole daily, which helps. No other GI c/o. He sees his cardiologist, Dr Myrtis Ser, today.   GI Review of Systems    Reports abdominal pain, acid reflux, chest pain, and  heartburn.     Location of  Abdominal pain: upper abdomen.    Denies belching, bloating, dysphagia with liquids, dysphagia with solids, loss of appetite, nausea, vomiting, vomiting blood, weight loss, and  weight gain.      Reports heme positive stool.     Denies anal fissure, black tarry stools, change in bowel habit, constipation, diarrhea, diverticulosis, fecal incontinence, hemorrhoids, irritable bowel syndrome, jaundice, light color stool, liver problems, rectal bleeding, and  rectal pain.  Current Medications (verified): 1)  Plavix 75 Mg Tabs (Clopidogrel Bisulfate) .... Take One Tablet By Mouth Daily 2)  Ramipril 10 Mg Caps (Ramipril) .... Take One Capsule By Mouth Daily 3)  Aspirin Ec 325 Mg Tbec (Aspirin) .... Take One Tablet By Mouth Daily 4)  Simvastatin 20 Mg Tabs (Simvastatin) .... Take One Tablet By  Mouth Daily At Bedtime 5)  Amlodipine Besylate 10 Mg Tabs (Amlodipine Besylate) .... Take One Tablet By Mouth Daily 6)  Calcium Carbonate-Vitamin D 600-400 Mg-Unit  Tabs (Calcium Carbonate-Vitamin D) .... Take 1 Tablet By Mouth Once A Day 7)  Omeprazole 20 Mg  Cpdr (Omeprazole) .Marland Kitchen.. 1 Each Day 30 Minutes Before Meal 8)  Glucosamine 1500 Complex  Caps (Glucosamine-Chondroit-Vit C-Mn) .... Take 1 Tablet By Mouth Once A Day 9)  Janumet 50-500 Mg Tabs (Sitagliptin-Metformin Hcl) .... Take 1 Tablet By Mouth Once A Day 10)  Nitroglycerin 0.4 Mg Subl (Nitroglycerin) .... One Tablet Under Tongue Every 5 Minutes As Needed For Chest Pain---May Repeat Times Three 11)  Loradamed 10 Mg Tabs (Loratadine) .... As Needed 12)  Fish Oil   Oil (Fish Oil) .... 1000mg  Once Daily 13)  Vitamin D 400 Unit  Tabs (Cholecalciferol) .... Once Daily 14)  Gaviscon Extra Relief Formula 160-105 Mg Chew (Alum Hydroxide-Mag Carbonate) .... As Needed  Allergies (verified): 1)  ! Crestor (Rosuvastatin Calcium) 2)  ! Lipitor  Past History:  Past Medical History: GERD Irritable bowel History adenomatous polyp Diverticulosis History fatty liver Diabetes Hypercholesterolemia EF  normal... echo... September, 2008  /    63% ...nuclear...12/2007 Atrial bigeminy in the past Long-term Plavix CAD. 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  /    Taxus stent to the LAD 2004.  /   Cypher Stent proximal edge  LAD and Cypher stent  mid circumflex May 2006 /   nuclear stress 12/2007..normal..EF  63%  /   nuclear.. September 12, 2009... EF 62%... inferior  thinning and very mild ischemia  inferior and apex  /  catheterization.. September 29, 2009.... EF 60%.. mild-to-moderate diffuse RCA disease.... moderate (50-60%)discrete LAD stenosis before the stent in the proximal LAD - medical therapy recommended  Hypertension Tingling in hands ... resolved Left hand numbness and slight tingling different from prior sensation in his  hands BPH Urinary Tract Infection Obesity  Past Surgical History:  Acute inferior MI January 2002 with stent.  /    Taxus stent to the LAD 2004.  /   Cypher Stent proximal edge  LAD and Cypher stent  mid circumflex May 2006 /   nuclear stress 12/2007..normal..EF  63%  /   nuclear.. September 12, 2009... EF 62%... inferior thinning and very mild ischemia  inferior and apex  /  catheterization.. September 29, 2009.... EF 60%.. mild-to-moderate diffuse RCA disease.... moderate (50-60%)discrete LAD stenosis before the stent in the proximal LAD   Family History: Reviewed history from 10/16/2009 and no changes required. noncontributory MI PGF No FH of Colon Cancer  Social History: Retired  Married Childern  Tobacco Use - No.  Alcohol Use - no Drug Use - no Daily Caffeine Use: 0-1 daily   Review of Systems       The patient complains of back pain.  The patient denies allergy/sinus, anemia, anxiety-new, arthritis/joint pain, blood in urine, breast changes/lumps, change in vision, confusion, cough, coughing up blood, depression-new, fainting, fatigue, fever, headaches-new, hearing problems, heart murmur, heart rhythm changes, itching, muscle pains/cramps, night sweats, nosebleeds, shortness of breath, skin rash, sleeping problems, sore throat, swelling of feet/legs, swollen lymph glands, thirst - excessive, urination - excessive, urination changes/pain, urine leakage, vision changes, and voice change.    Vital Signs:  Patient profile:   77 year old male Height:      66 inches Weight:      224 pounds BMI:     36.29 BSA:     2.10 Pulse rate:   88 / minute Pulse rhythm:   regular BP sitting:   128 / 64  (left arm) Cuff size:   large  Vitals Entered By: Ok Anis CMA (October 19, 2009 9:34 AM)  Physical Exam  General:  Well developed, well nourished, no acute distress. Head:  Normocephalic and atraumatic. Eyes:  PERRLA, no icterus. Ears:  Normal auditory acuity. Nose:  No deformity,  discharge,  or lesions. Mouth:  No deformity or lesions. Neck:  Supple; no masses or thyromegaly. Lungs:  Clear throughout to auscultation. Heart:  Regular rate and rhythm; no murmurs, rubs,  or bruits. Abdomen:  Soft, nontender and nondistended. No masses, hepatosplenomegaly or hernias noted. Normal bowel sounds. Rectal:  deferred un til colonoscopy Prostate:  deferred Msk:  Symmetrical with no gross deformities. Normal posture. Pulses:  Normal pulses noted. Extremities:  No edema or deformities noted. Neurologic:  Alert and  oriented x4 Skin:  Intact without significant lesions or rashes. Psych:  Alert and cooperative. Normal mood and affect.   Impression & Recommendations:  Problem # 1:  FECAL OCCULT BLOOD (ICD-792.1) 76 y.o. w/ multiple medical problems who presents for evaluation of heme + stool. No anemia. Hx GERD and adenomatous polyps. On ASA and Plavix. Some UGI complaints. We discussed possible causes for heme + stool.  PLAN: 1. Colonoscopy. The nature of the procedure, risks, benefits, and alternatives reviewed. He understood and agreed to proceed. 2. The patient is high risk with his comorbidities and the need to address diabetic meds and plavix therapy. The pros and  cons of interrupting plavix were reviewed and discussed in detail. After careful consideration, it was elected to perform is exam ON PLAVIX and ASA. 3. Movi prep prescribed. Patient instructed on its use 4. EGD to eval heme + stool and UGI c/o. 5. Hold diabetic meds prior to procedures  Problem # 2:  COLONIC POLYPS, ADENOMATOUS, HX OF (ICD-V12.72) hx adenoma in 2003. Negative in 2007. Routine f/u tenatively planned for 2012 but will move up due to heme + stool  Problem # 3:  GERD (ICD-530.81) PLAN:  1. Cont PPI (take 12 hour apart from plavix) 2. Reflux precautions 3. EGD  Problem # 4:  DM (ICD-250.00) Hold diabetes meds the day of the procedure to avoid hypoglycemia. We will monitor blood glucose  immediately before and after his procedures  Other Orders: Colon/Endo (Colon/Endo)  Patient Instructions: 1)  Colon/Endo [facility] 11/23/09 1:30 pm arrive at 12:30 pm 2)  Movi prep instrucitons given. 3)  Movi prep Rx. sent to pharmacy. 4)  Hold DM meds morning of procedure. 5)  Stay on Plavix and Aspirin per Dr. Marina Goodell. 6)  Copy sent to : Gaspar Garbe, MD,   Jerral Bonito, MD 7)  Colonoscopy and Flexible Sigmoidoscopy brochure given.  8)  Upper Endoscopy brochure given.  9)  The medication list was reviewed and reconciled.  All changed / newly prescribed medications were explained.  A complete medication list was provided to the patient / caregiver. Prescriptions: MOVIPREP 100 GM  SOLR (PEG-KCL-NACL-NASULF-NA ASC-C) As per prep instructions.  #1 x 0   Entered by:   Milford Cage NCMA   Authorized by:   Hilarie Fredrickson MD   Signed by:   Milford Cage NCMA on 10/19/2009   Method used:   Electronically to        [facility]* (retail)       [id number] [place], [place]  [address]       Ph: [phone]       Fax: [phone]   RxID:   [id number]

## 2010-02-15 NOTE — Cardiovascular Report (Signed)
Summary: Pre Cath Orders   Pre Cath Orders   Imported By: Roderic Ovens 09/27/2009 15:11:21  _____________________________________________________________________  External Attachment:    Type:   Image     Comment:   External Document

## 2010-03-27 LAB — GLUCOSE, CAPILLARY
Glucose-Capillary: 109 mg/dL — ABNORMAL HIGH (ref 70–99)
Glucose-Capillary: 130 mg/dL — ABNORMAL HIGH (ref 70–99)

## 2010-03-29 LAB — POCT I-STAT GLUCOSE
Glucose, Bld: 162 mg/dL — ABNORMAL HIGH (ref 70–99)
Operator id: 221371

## 2010-05-29 NOTE — Assessment & Plan Note (Signed)
[facility]                            CARDIOLOGY OFFICE NOTE   NAME:Sandell, CHARLENE COWDREY                      MRN:          [id number]  DATE:10/19/2007                            DOB:          1933-08-30    Mr. Flemister is here for the followup of cholesterol treatment and  coronary artery disease.  When I saw him last, he had been taken off  Crestor and put back on simvastatin.  He is tolerating the simvastatin.  He has not had return of major aches and pains.  His simvastatin will be  continued.  No other change as far as the treatment of his  hypercholesterolemia at this point.  Concerning his coronary artery  disease, he is stable.  He is not having any significant chest pain or  significant shortness of breath.  He has had no syncope or presyncope.  He is stable.   PAST MEDICAL HISTORY:   ALLERGIES:  He had muscle aches and pains from CRESTOR.   MEDICATIONS:  Plavix, Altace, aspirin, glucosamine, calcium, omeprazole,  Janumet, and simvastatin.   OTHER MEDICAL PROBLEMS:  See the complete list on my note of July 14, 2007.   REVIEW OF SYSTEMS:  He is not having any significant GI or GU symptoms.  His review of systems otherwise is negative.   PHYSICAL EXAMINATION:  VITAL SIGNS:  Blood pressure is 130/70 with pulse  of 70.  GENERAL:  The patient is oriented to person, time and place.  Affect is  normal.  HEENT:  No xanthelasma.  He has normal extraocular motion.  NECK:  There are no carotid bruits.  There is no jugular venous  distention.  LUNGS:  Clear.  Respiratory effort is not labored.  CARDIAC:  An S1 with an S2.  There are no clicks or significant murmurs.  ABDOMEN:  Soft.  EXTREMITIES:  There is no significant peripheral edema.   No labs are done today.   Problems are listed completely on the note of July 14, 2007.  #6.  Hypercholesterolemia.  He is now back on simvastatin.  I will leave  the followup of his labs to Dr. Wylene Simmer, but  I am glad that he is  tolerating the statin.  #7.  History of ejection fraction in the 50% range.  There is no need  for followup echo at this time.  #9.  Long-term Plavix.  This is to be continued but can be held if  needed for surgical procedures.  #10.  Coronary artery disease.  He is not having any significant  symptoms.  No other testing is needed at this time.   I will see him back for cardiology followup in 1 year.     Luis Abed, MD, [facility]  Electronically Signed    JDK/MedQ  DD: 10/19/2007  DT: 10/19/2007  Job #: [id number]   cc:   Gaspar Garbe, M.D.

## 2010-05-29 NOTE — Assessment & Plan Note (Signed)
[facility]                            CARDIOLOGY OFFICE NOTE   NAME:Ray, [name] DRIGGERS                      MRN:          [id number]  DATE:07/14/2007                            DOB:          Nov 30, 1933    HISTORY OF PRESENT ILLNESS:  [name] Ray is seen for followup.  See my  note of 07/02/2007.  We held his Crestor.  He felt much better within 2-  3 days.  He still has some mild discomfort in his legs.  This also is  different from [name] knee discomfort that he has.  With this in mind, we  will keep him off Crestor.   ALLERGIES:  No known drug allergies, but it appears now that he gets  muscle aches and pains from Crestor.   MEDICATIONS:  Plavix, Altace, aspirin, glucosamine, multivitamin,  calcium, loratadine, omeprazole, and Janumet.   OTHER MEDICAL PROBLEMS:  See [name] list below.   REVIEW OF SYSTEMS:  [name] Ray mentions that he does have knee  discomfort.  He has been seen by Orthopedics in [name] past and there was  question of whether or not he could ever have his knees replaced.  I  told him from [name] cardiac viewpoint that if and when he is ready that he  can have knee surgery.  However, I would not proceed with this unless he  becomes more symptomatic and he agrees.  Otherwise, his review of  systems is negative.   PHYSICAL EXAMINATION:  VITAL SIGNS:  Weight is 225 pounds.  Blood  pressure is 143/79 with a pulse of 71.  NEUROLOGIC:  [name] Ray is oriented to person, time, and place.  Affect  is normal.  HEENT:  No xanthelasma.  He has normal extraocular motion.  There are no  carotid bruits.  There is no jugular venous distention.  LUNGS:  Clear.  Respiratory effort is not labored.  CARDIAC:  S1 and S2.  There are no clicks or significant murmurs.  ABDOMEN:  Soft.  He has no significant peripheral edema.   LABORATORY DATA:  When he was here last, we did check labs.  His  hemoglobin was 15.2 with a platelet count of 120,000.  Potassium was  5.1, BUN 12, and creatinine 0.9.  His glucose was 150.  CPK was checked  and it was normal at 72.   PROBLEMS:  1. History of gastroesophageal reflux disease and irritable bowel.  2. History of adenomatous polyp.  3. Diverticulosis.  4. History of fatty liver.  5. Diabetes.  6. Hypercholesterolemia.  He is now off Crestor for 2 weeks.  I am      hesitant to keep him off all statins long-term.  He will remain off      all statins for another 2 weeks unless he has any recurrent      problems.  He will then start simvastatin 20 mg daily.  Note that      when he was on Vytorin 10/20, he had no significant symptoms.  This      dose of simvastatin, of course, will not be as  effective, but      having him back on a statin is important.  I will give him [name]      prescription.  He will actually see Dr. Wylene Simmer in [name] meantime.  7. History of ejection fraction in [name] 50% range.  I have considered      doing a followup echo, but this has not yet been re-done.  He did      have an echo in September 2008, that was quite good and there is no      need to repeat one at this time.  He had normal LV function at that      time.  There were no wall motion abnormalities.  [name] ejection      fraction was not assessed with that study.  8. History of some atrial bigeminy in [name] past.  9. Long-term Plavix.  10.Coronary artery disease.  [name] Ray had an acute inferior      myocardial infarction in January 2002.  Treated with a stent.  In      2004, he had a Taxus stent to [name] left anterior descending and in      May 2006, he had a Cypher stent to [name] proximal edge of [name] left      anterior descending, and a Cypher stent to [name] mid-circumflex.  He      is stable.  He needs long-term Plavix.  His Plavix could be held      for procedure such as knee surgery, but I am hesitant unless he has      debilitating symptoms.  We talked about this and he understands.   [name] plan will be for him to try to restart some  simvastatin if Dr.  Wylene Simmer is in agreement.  I will see [name] Ray back in 3 months for  Cardiology followup.     Luis Abed, MD, [facility]  Electronically Signed    JDK/MedQ  DD: 07/14/2007  DT: 07/15/2007  Job #: [id number]   cc:   Gaspar Garbe, M.D.

## 2010-05-29 NOTE — Assessment & Plan Note (Signed)
[facility]                            CARDIOLOGY OFFICE NOTE   NAME:[name] Ray, [name] Ray                      MRN:          [id number]  DATE:07/02/2007                            DOB:          [birth year]    Mr. Bracamonte returns for cardiology followup.  I saw him last in September  2008.  He is not having any significant chest pain or shortness of  breath.  He does have fatigue and muscle aches and pains.  It is of note  that his insurance company had insisted that he be moved away from  Vytorin since it was non-formulary.  Crestor was the preferred brand  including also Lipitor and simvastatin generic.  He was placed on  Crestor.  He is not feeling well with this.  It may be related to the  Crestor.  I explained to him that simvastatin is part of the Vytorin  that he was on.  His prior Vytorin dose was 10/20.   He is not having any shortness of breath, syncope, or presyncope.  His  activities, however, are somewhat limited by the muscle aches and pains  that he is having.   PAST MEDICAL HISTORY:   ALLERGIES:  No known drug allergies.   MEDICATIONS:  1. Plavix 75.  2. Altace 10.  3. Aspirin 325.  4. Glucosamine.  5. Multivitamin.  6. Calcium.  7. Loratadine.  8. Omeprazole.  9. Janumet 50/500.  10.Crestor 40 (to be put on hold today).   OTHER MEDICAL PROBLEMS:  See the list below.   REVIEW OF SYSTEMS:  Other than the HPI, his review of systems is  negative.   PHYSICAL EXAMINATION:  Weight today is 224 pounds, which is stable for  him.  Clearly he needs to lose weight.  Blood pressure is 140/80 with  the pulse of 62.  The patient is oriented to person, time, and place.  Affect is normal.  HEENT reveals no xanthelasma.  He has normal extraocular motion.  There  are no carotid bruits.  There is no jugular venous distention.  Lungs  are clear.  Respiratory effort is not labored.  Cardiac exam reveals an  S1 with an S2.  There are no clicks or  significant murmurs.  The abdomen  is soft and protuberant.  The patient is significantly overweight.  He  has no significant peripheral edema.  There are no musculoskeletal  deformities.   EKG today reveals sinus rhythm with a small Q-wave in lead III.  There  is no change from the past.   PROBLEMS:  1. Gastroesophageal reflux disease and irritable bowel.  2. History of adenomatous polyps.  3. Diverticulosis.  4. History of fatty liver.  5. Diabetes.  6. Hypercholesterolemia.   As outlined above, he is having fatigue and aches and pains.  This may  be related to Crestor.  For today, we will check CBC, BMET, and a CPK,  and I will have him hold his Crestor.  We will then arrange for followup  in several weeks to see how he is doing in general and try  to help make  further decisions about the approach to his lipids.  I will work with  Dr. Wylene Simmer on this.  My office time will be limited over the next month  and I will check to see if it will be better for me to see him or ask  Dr. Wylene Simmer to follow through on this issue.     Luis Abed, MD, [facility]  Electronically Signed    JDK/MedQ  DD: 07/02/2007  DT: 07/02/2007  Job #: [id number]   cc:   Gaspar Garbe, M.D.

## 2010-05-29 NOTE — Assessment & Plan Note (Signed)
[facility]                            CARDIOLOGY OFFICE NOTE   NAME:[name] Ray, [name] Ray                      MRN:          [id number]  DATE:01/01/2008                            DOB:          September 26, 1933    [name] Ray is here for followup.  He is here with his wife today.  I saw  him last on December 24, 2007.  The patient has had some vague chest  discomfort.  Also, his blood pressure had been elevated.  Dr. Wylene Simmer  has been titrating his amlodipine up and it was increased to 10 mg on  December 21, 2007.  When I saw him on December 24, 2007, we decided not  to make any changes, but to follow his pressure.  An adenosine Myoview  scan was arranged.  This was done on December 30, 2007.  He had no  significant ischemia.  Wall motion was good.   The patient's vague chest discomfort is in fact improved.  It was most  probably musculoskeletal.  He is feeling well.  His blood pressure  monitor appears to show that his pressure is remaining in the range of  150-160 systolic.  Yet today in the office, his pressure is in the range  of 125 systolic.  His wife will be bringing in his blood pressure cuff  and we will compare it to our cuff.   PAST MEDICAL HISTORY:   ALLERGIES:  The patient did not tolerate CRESTOR.   MEDICATIONS:  See the flow sheet.   REVIEW OF SYSTEMS:  He feels well.  He is not having any fevers or  chills.  He has no rashes.  He has no GI or GU symptoms.  His review of  systems is negative.   PHYSICAL EXAMINATION:  VITAL SIGNS:  Today blood pressure is 126/71 with  a pulse of 64.  GENERAL:  The patient is oriented to person, time, and place.  Affect is  normal.  He is overweight.  HEENT:  No xanthelasma.  He has normal extraocular motion.  NECK:  There are no carotid bruits.  There is no jugular venous  distention.  LUNGS:  Clear.  Respiratory effort is not labored.  CARDIAC:  An S1 with an S2.  There are no clicks or significant  murmurs.  ABDOMEN:  Soft.  He is overweight.  There is no peripheral edema.   The nuclear scan result is reviewed completely.   Problems are listed completely on the note of July 14, 2007.  #10.  Coronary artery disease.  This issue appears to be stable.  No  further testing is needed.  #11.  Hypertension.  I will not be changing his medications at this  point.  We will be comparing his home blood pressure cuff with our cuff  to get a higher level of understanding.   ADDENDUM:  [name] Ray home blood pressure cuff was compared to our  cuff and his pressure is on his machine at home are definitely higher  than in our office.  With this in mind, he will return his cuff and  see  if he can get a new one.  We will not change his medicines that  appears that he is getting relatively good blood pressure control.  He  will follow up with Dr. Wylene Simmer in January.     Luis Abed, MD, [facility]  Electronically Signed    JDK/MedQ  DD: 01/01/2008  DT: 01/02/2008  Job #: [id number]   cc:   Gaspar Garbe, M.D.

## 2010-05-29 NOTE — Assessment & Plan Note (Signed)
[facility]                            CARDIOLOGY OFFICE NOTE   NAME:Ray Ray CLAUSEN                      MRN:          [id number]  DATE:12/24/2007                            DOB:          02/03/1933    Ray Ray is here for Cardiology followup.  He does have significant  coronary artery disease.  He also has hypertension.  His blood pressure  has been elevated and Dr. Wylene Simmer started amlodipine.  His dose was then  pushed up from 5-10 mg by telephone.  He has brought me his blood  pressures.  He continues to be hypertensive, but the trend is in good  direction.  He will do more blood pressure checks and be back in touch  with Dr. Deneen Harts office in the beginning of next week.   The patient tells me also that he has had some discomfort in his right  shoulder.  It seems that this pressure is the most marked if he rolls  over onto his side in bed.  I am not convinced that this represents  significant exertional ischemia from his history.  However, we need to  be careful with his evaluation.  We know he has significant disease.  His last testing was in 2005.   PAST MEDICAL HISTORY:  Allergies:  The patient did not tolerate CRESTOR.   MEDICATIONS:  See the flow sheet.   REVIEW OF SYSTEMS:  Not having any GI or GU symptoms.  He has some mild  back pain.  There are no fevers, chills, or headaches.  His review of  systems is negative.   PHYSICAL EXAMINATION:  VITAL SIGNS:  Today, blood pressure is 153/66  with a pulse of 65.  Weight is 222 pounds.  This is down a pound or two  since his last visit.  GENERAL:  The patient is oriented to person, time, and place.  Affect is  normal.  HEENT:  No xanthelasma.  He has normal extraocular motion.  NECK:  There are no carotid bruits.  There is no jugular venous  distention.  LUNGS:  Clear.  Respiratory effort is not labored.  CARDIAC:  S1 and S2.  There is no clicks or significant murmur.  ABDOMEN:  Soft,  but obese.  EXTREMITIES:  He has no significant peripheral edema.   EKG reveals sinus rhythm with PACs.  Some of his PACs are in a bigeminal  pattern.   Problems are listed completely on my note of July 14, 2007.  These are  listed from #1 to #10.   #10.  Coronary artery disease.  The patient has some right shoulder  pain.  He had a Cypher stent in 2 places in May 2006.  He needs to  follow up exercise test at this point and this will be arranged.  I will  then see him for followup.     Luis Abed, MD, [facility]  Electronically Signed    JDK/MedQ  DD: 12/24/2007  DT: 12/25/2007  Job #: [id number]   cc:   Gaspar Garbe, M.D.  Technical sales engineer

## 2010-05-29 NOTE — Assessment & Plan Note (Signed)
[facility]                            CARDIOLOGY OFFICE NOTE   NAME:Forehand, CLEMENCE STILLINGS                      MRN:          [id number]  DATE:09/16/2006                            DOB:          1933-07-16    Mr. Deboard is doing very well.  He was able to undergo his colonoscopy  with the patient on Plavix.  This went well.  He is not having any chest  pain or major shortness of breath.  He has had some mild dizziness.  I  believe that it is related to the heat and his volume status at that  time.  He has had no syncope or presyncope.  He has known significant  coronary disease with several interventions over the years, and he  remains on long-term aspirin and Plavix.   In the past his ejection fraction historically in most settings has been  in the 50% range, although on one nuclear study, the question was as low  as 36%.  It is now appropriate to recheck his LV function to be sure.  He does not need any exercise testing at this time.   PAST MEDICAL HISTORY:  Other medical problems, see the list below.   ALLERGIES:  No known drug allergies.   MEDICATIONS:  Plavix, Altace, aspirin, omeprazole, Metformin,  glucosamine, multivitamin, Vytorin 10/20.  He is also on a short course  of Augmentin by Dr. Wylene Simmer.  The patient tells me this is for the  possibility of some diverticulitis, and he takes loratadine almost daily  for allergies.   REVIEW OF SYSTEMS:  Other than the HPI, his review of systems is  negative.   PHYSICAL EXAMINATION:  Blood pressure is 130/68 with a pulse of 71.  His  weight is 223 pounds.  This is decreased from the last visit, but he  needs to continue to lose weight.  Patient is oriented to person, time, and place.  Affect is normal.  HEENT:  No xanthelasma.  He has normal extraocular motion.  NECK:  There are no carotid bruits.  There is no jugular venous  distention.  LUNGS:  Clear.  Respiratory effort is not labored.  CARDIAC:   An S1 with an S2.  There are no clicks or significant murmurs.  ABDOMEN:  Obese but soft.  He has normal bowel sounds.  EXTREMITIES:  He has no peripheral edema.   EKG reveals no significant change.   PROBLEMS:  1. Gastroesophageal reflux disease, irritable bowel.  2. History of adenomatous polyps.  3. Diverticulosis.  4. History of fatty liver.  5. Diabetes.  6. Hypercholesterolemia.  He asked me about the continued use of      Vytorin.  In his case, I am in favor of this, keeping him at goal      is very important with his aggressive coronary disease.  7. History of ejection fraction in the 50% range.  We do need to      redocument this with an echo at this time.  8. History of some atrial bigeminy.  9. Long-term Plavix.  10.Coronary disease.  He had an acute inferior myocardial infarction      in January, 2002 that was with a stent.  In 2004, he had a Taxus      stent to the left anterior descending artery.  In May, 2006, he had      a Cypher stent to the proximal edge of the left anterior descending      artery and a Cypher stent to his mid circumflex.  He is stable.  I      do not believe his dizziness represents a significant cardiac      problem.  We will check a 2D echo.  I will see him back in nine      months.     Luis Abed, MD, [facility]  Electronically Signed    JDK/MedQ  DD: 09/16/2006  DT: 09/16/2006  Job #: [id number]   cc:   Gaspar Garbe, M.D.

## 2010-06-01 NOTE — Cardiovascular Report (Signed)
NAME:  ARMIN, YERGER NO.:  [id number]   MEDICAL RECORD NO.:  [id number]          PATIENT TYPE:  OIB   LOCATION:  2855                         FACILITY:  [facility]   PHYSICIAN:  Charlies Constable, M.D. [facility] DATE OF BIRTH:  [birth year]   DATE OF PROCEDURE:  05/21/2004  DATE OF DISCHARGE:                              CARDIAC CATHETERIZATION   CLINICAL COURSE:  Mr. Lorimer is 77 years old and has had a previous anterior  wall infarction treated with PCI and previous stenting with a drug eluding  stent in the proximal LAD and previous bare metal stents placed in the right  coronary artery. The last of these interventions was of the LAD in 2004. He  developed recurrent symptoms and was started today in the outpatient  laboratory by Dr. Andee Lineman who found a 90% stenosis in the LAD just proximal  to the stent. There was also a 90% lesion in the mid circumflex artery. The  stents in the right coronary artery remain patent. His lesion was felt too  tight to send him home and he was brought down here for intervention.   DESCRIPTION OF PROCEDURE:  The procedure was performed via the right femoral  artery. We used a Q3.46 [place] cutting catheter with side holes and a  Research scientist (physical sciences). The patient was given __________ bolus and infusion and had  been on Plavix prior to his catheterization. We were able to navigate the  wire down the LAD without difficulty. We direct stented with a 2.5 x 18 mm  CYPHER stent, deploying this with one inflation at 14 atmospheres for 30  seconds. We then post dilated with a 3.0 x 15 mm Quantum Maverick following  two inflations up to 16 atmospheres for 30 seconds.   We then approached the circumflex artery. We navigated the Prowater wire  down into the distal circumflex artery, crossed the lesion in the mid  circumflex. We initially used a 2.0 x 10 mm cutting balloon and performed  four inflations up to 8 atmospheres for 30 seconds. This gave a suboptimal  result. We next went in a 2.25 x 20 mm Maverick and performed three  inflations up to 8 atmospheres about 30 seconds. We still had a suboptimal  result. For this reason, we decided to stent the lesion and used a 2.5 x 23  mm CYPHER stent inflating this with one inflation at 9 atmospheres for 30  seconds. We then post dilated with a 2.5 x 20 mm Quantum Maverick for two  inflations up to 15 atmospheres for 30 seconds avoiding the distal edge.  Final diagnostic study was then performed through the guiding catheter. The  patient tolerated the procedure well and left the lab in satisfactory  condition with good test results. The lesion in the proximal LAD was  initially 90% and located right at the proximal edge of the previously  placed stent. Following stent, he was improved to less than 10%.   The lesion in the mid segment was a long lesion that was estimated at 90%.  Following stenting, it was improved to 0%. There was  no dissection seen.   CONCLUSION:  1.  Successful stenting of the lesion in the proximal left anterior      descending at the edge of the previously placed stent with improvement      in narrowing from 90% to less than 10% with a CYPHER drug eluding stent.  2.  Successful stenting of the lesion in the mid right coronary artery using      a CYPHER drug eluding stent with improvement in narrowing from 90% to      0%.   DISPOSITION:  The patient was taken to postanesthesia for further  observation.      BB/MEDQ  D:  05/21/2004  T:  05/21/2004  Job:  [id number]   cc:   Gaspar Garbe, M.D.  [address]  [place]  [place] [address]  Fax: [phone]   Willa Rough, M.D.

## 2010-06-01 NOTE — Cardiovascular Report (Signed)
NAME:  [name] Ray, [name] Ray                         ACCOUNT NO.:  [id number]   MEDICAL RECORD NO.:  [id number]                   PATIENT TYPE:  INP   LOCATION:  6525                                 FACILITY:  [facility]   PHYSICIAN:  Veneda Melter, M.D.                   DATE OF BIRTH:  15-Jun-1933   DATE OF PROCEDURE:  09/15/2002  DATE OF DISCHARGE:  09/16/2002                              CARDIAC CATHETERIZATION   PROCEDURES PERFORMED:  1. Left heart catheterization.  2. Left ventriculogram.  3. Selective coronary angiography.  4. Percutaneous transluminal coronary angioplasty and stent placement of the     proximal left anterior descending.   DIAGNOSES:  1. Coronary atherosclerotic disease.  2. Normal left ventricular systolic function.  3. Unstable angina.   HISTORY:  [name] Ray is a 77 year old white male with diabetes mellitus and  dyslipidemia as well as coronary disease who presents with substernal chest  discomfort.  The patient has previously undergone percutaneous intervention  to the proximal and distal right coronary artery on January 17, 2000 for an  acute inferior wall myocardial infarction.  He had done well in the interim,  however, in the last two weeks he has had crescendo angina prompting his  admission to the hospital.  He was stabilized medically with anticoagulants.  He is ruled out for acute myocardial infarction.  He presents for further  cardiac assessment.   TECHNIQUE:  Informed consent was obtained.  The patient brought to the  catheterization lab.  A 6 French sheath was placed in the right femoral  artery using the modified Seldinger technique.  A 6 [place] JL-4 and JR-4  catheter was then used to engage the left and right coronary arteries and  selective angiography performed in various projections using manual  injection contrast.  A 6 French pigtail catheter was advanced in the left  ventricle and a left ventriculogram performed using power injection  contrast.   FINDINGS:   LEFT HEART CATHETERIZATION:  1. Left main trunk:  Medium caliber vessel with mild irregularities.  2. LAD:  This is a medium-caliber vessel that provides diagonal branch at     the mid section.  The LAD has mild disease in the ostial segment of 30%.     There is then a high grade narrowing of 90-95% in the proximal segment     between the ostium and the takeoff of the diagonal branch.  The distal     LAD has mild irregularities of 20%.  3. Left circumflex artery:  This is a medium caliber vessel that provides a     large first marginal branch in the mid section, small second marginal     branch distally. The AV circumflex has mild disease of 20% in the     proximal segment.  The first marginal branch has proximal narrowing of     60%.  The second marginal  branch has moderate narrowing of 40-50% in its     proximal segment.  4. Ramus intermedius:  This is a medium caliber vessel that provides the     anterior lateral wall.  There is mild disease of 30-40% in the proximal     segment.  5. Right coronary artery is dominant. This is a medium caliber vessel that     provides posterior descending artery and several small posterior     ventricular branches of its terminal segment.  The right coronary artery     has a previously placed stent in the proximal bend with in-stent     restenosis of approximately 60%.  There is a moderate disease of 50% in     the mid section.  There is a previously placed stent in the distal right     coronary artery with in-stent restenosis of 40-50% as well. The distal     branch vessel have mild diffuse disease of 30-50%.   LEFT VENTRICULOGRAPHY:  1. Normal end-systolic and end-diastolic dimensions.  2. Overall left ventricular function is well preserved.  3. Ejection fraction greater than 55%.  4. No mitral regurgitation.  5. LV pressure is 140/10.  6. Aortic pressure is 140/65.  7. LVEDP equals 20.   These findings were reviewed  with the patient and we elected to proceed with  percutaneous intervention of the LAD.  The patient was given heparin to  maintain ACT of greater than 250 seconds.  He was also given ReoPro on  weight-adjusted basis and 600 mg of Plavix.  A 6 French Q left 3.5 guide  catheter was used to engage the left coronary artery and 0.014-inch Forte  wire advanced in the proximal LAD. This was used to size the vessel diameter  and lesion length.  The wire was advanced distally and a  2.75 x 12-mm Taxus Express II stent introduced.  This was carefully  positioned in the proximal LAD over the lesion and primarily deployed at 10  atmospheres for 45 seconds.  A 3.0 x 8-mm Quantum Maverick balloon was then  used to post dilate the stent and two inflations were performed at 14  atmospheres for 30 seconds within the distal and proximal segments.  Repeat  angiography was then performed after the administration of intracoronary  nitroglycerin showing excellent result with full coverage of the lesion.  No  residual stenosis and no vessel damage.  There was TIMI-3 flow through the  LAD.  Final angiography was performed in various projections confirming no  vessel damage.  The guide catheter was removed and the sheath secured in  position.  The patient  tolerated the procedure well and was transferred to  the floor in stable condition.   FINAL RESULT:  1. Successful percutaneous transluminal coronary angioplasty and stent     placement in the proximal left anterior descending with reduction of 90%     narrowing to 0% with placement of 2.75 x 12-mm Taxus stent dilated to 3     mm.   ASSESSMENT AND PLAN:  Ms.  Dishman is a 77 year old gentleman with diabetes  mellitus and coronary atherosclerotic disease. He has moderate residual  disease in the right coronary artery and left circumflex system.  He will be reassessed clinically and consideration given towards stress imaging study  to determine if he has any  ischemia in these territories.  Otherwise,  aggressive risk factor modification and high dose statin therapy will be  pursued.  Veneda Melter, M.D.   NG/MEDQ  D:  09/15/2002  T:  09/16/2002  Job:  [id number]   cc:   Gaspar Garbe, M.D.  [address]  [place]  [place] [address]  Fax: [phone]

## 2010-06-01 NOTE — Discharge Summary (Signed)
NAME:  HAIDAR, MUSE NO.:  [id number]   MEDICAL RECORD NO.:  [id number]          PATIENT TYPE:  OIB   LOCATION:  6531                         FACILITY:  [facility]   PHYSICIAN:  Charlies Constable, M.D. [facility] DATE OF BIRTH:  1933-03-31   DATE OF ADMISSION:  05/21/2004  DATE OF DISCHARGE:  05/22/2004                           DISCHARGE SUMMARY - REFERRING   SUMMARY OF HISTORY:  Mr. Chui is a 77 year old male who was evaluated by  Dr. Myrtis Ser in the office on May 16, 2004 with several episodes of exertional  chest burning which was not always reproducible. The patient is not clear if  this is related to his GI symptoms or if it was related to his prior cardiac  history.   His history is notable for Taxus stenting to the LAD in 2004 and remote  stenting of the right coronary artery. Please refer to Dr. Henrietta Hoover notes on  May 16, 2004. He also has a history of non-insulin-dependent diabetes,  hyperlipidemia, irritable bowel, diverticulosis, fatty liver, atrial  bigeminy, lifelong Plavix for myocardial infarction, beta blocker  intolerance.   LABORATORY DATA:  Preadmission H&H was 14.9 and 45.8, normal indices,  platelets 227, WBC's 8.7. PT 12.5, PTT 25.8, sodium 138, potassium 4.5, BUN  19, creatinine 1.3. Postprocedure CBC and chemistry remained unchanged. EKG  reveals sinus bradycardia, left axis deviation, early R-wave, nonspecific ST-  T wave changes.   HOSPITAL COURSE:  Mr. Knupp was brought in for cardiac catheterization for  evaluation of his symptoms. On May 21, 2004, he underwent cardiac  catheterization by Dr. Andee Lineman. Please refer to dictation. He did have an EF  of 55%, a 90% proximal lesion at the edge of the LAD stent, a 90% mid  circumflex.  There were slight restenosis in the RCA stents. After review,  Dr. Juanda Chance performed Cypher stenting to the LAD at the stent edge and of the  circumflex reducing both lesions from 90% to 0%. Postcatheterization, the  patient did  have some hypertension. However, cath site was stable. On May 22, 2004, he was assessed by Dr. Andee Lineman and it was felt that he could be  discharged home.   DISCHARGE DIAGNOSES:  1.  Unstable angina status post stenting with Cypher stents to the LAD and      circumflex as previously described.  2.  Hyperglycemia with a history of diabetes history as previously.   DISPOSITION:  He is discharged home.   MEDICATIONS:  1.  Plavix 75 mg daily, lifetime.  2.  Altace 10 mg daily.  3.  Vytorin 10/40 mg daily at bedtime.  4.  Metformin 500 mg b.i.d. to be resumed on May 23, 2004.  5.  Multivitamin daily.  6.  Nitroglycerin 0.4 p.r.n.  7.  Prilosec 20 mg daily.  8.  Glucosamine as previously.  9.  He is also asked to increase his aspirin to 325 mg daily.   DISCHARGE INSTRUCTIONS:  He was advised no lifting, driving, sexual  activity, or heavy exertion for 2 days. Maintain low salt, fat, cholesterol,  ADA diet. If he has any problems  with his catheterization site, he is asked  to call our office.   FOLLOWUP:  He will follow up with Dr. Myrtis Ser on June 20, 2004 at 9:30 a.m., and  he will follow up with his primary care physician to assure glucose control.      EW/MEDQ  D:  05/22/2004  T:  05/22/2004  Job:  [id number]   cc:   Willa Rough, M.D.   Gaspar Garbe, M.D.  [address]  [place]  [place] [address]  Fax: [phone]

## 2010-06-01 NOTE — Discharge Summary (Signed)
[place]  Patient:    [name] Ray, [name] Ray                      MRN: [id number] Adm. Date:  [id number] Disc. Date: [id number] Attending:  Talitha Givens Dictator:   Abelino Derrick, P.A.C. [facility] CC:         Coral Spikes, M.D.   Discharge Summary  DISCHARGE DIAGNOSES: 1. Coronary disease status post diaphragmatic myocardial infarction this    admission treated with RCA stent. 2. Non-insulin dependent diabetes.  HOSPITAL COURSE:  The patient is a 77 year old male with no prior cardiac history followed by Dr. Cheryll Cockayne for diabetes who presented with chest pain suspicious for unstable angina. EKG revealed an acute DMI. The patient was taken urgently to the Cath lab by Dr. Sharlene Dory on the 3rd. The catheter revealed an 80% proximal RCA narrowing that was dilated, total mid RCA that was dilated. He also had residual narrowings of 40% in the OM1 and 40% in the proximal circumflex, 30% LAD. EF was 50%. The patient tolerated the procedure well and was watched in the CCU afterward. CKs peaked at 718, 124 MBs. He was transferred to the floor and ambulated and we feel he can be discharged on January 20, 2000.  DISCHARGE MEDICATIONS:  Coated aspirin once a day, Altace 5 mg q.d., plavix q.d. for 4 weeks, nitroglycerin sublingual p.r.n., lipitor 5 mg q.d., glucophage 500 mg b.i.d.  LABORATORY DATA:  Sodium 133, potassium 4.1, BUN 9, creatinine 0.8, white count 8.5, hemoglobin 12.9, hematocrit 38.5. Lipid profile shows an LDL of 81, HDL 41.  Chest x-ray shows moderate cardiomegaly, no active disease. EKG shows normal sinus rhythm, inferior Q waves.  DISPOSITION:  The patient is discharged in stable condition to have follow-up with Dr. Myrtis Ser in 2-3 weeks. DD:  01/20/00 TD:  01/20/00 Job: [id number]

## 2010-06-01 NOTE — Cardiovascular Report (Signed)
[place]. [facility]  Patient:    ZAI, CHMIEL                     MRN: [id number] Proc. Date: 01/17/00 Attending:  Daisey Must, M.D. [facility] CC:         Jonelle Sports. Cheryll Cockayne, M.D.             Luis Abed, M.D. [facility]             Cardiac Catheterization Lab                        Cardiac Catheterization  PROCEDURES PERFORMED: 1. Left heart catheterization with coronary angiography and left    ventriculography. 2. Percutaneous transluminal coronary angioplasty with stent placement in the    distal right coronary artery. 3. Percutaneous transluminal coronary angioplasty with stent placement in the    proximal right coronary artery.  INDICATION:  Mr. Manon is a 77 year old male who presented to the emergency room with an acute inferior wall myocardial infarction with ongoing chest pain of approximately six to seven hours in duration.  DESCRIPTION OF PROCEDURE:  A 7 French sheath was placed in the right femoral artery.  A 6 French sheath was placed in the right femoral vein.  Standard Judkins 6 French catheters were utilized.  Contrast was Hexabrix.  There were no complications.  CATHETERIZATION RESULTS:  Hemodynamics:  Left ventricular pressure 160/24. Aortic pressure 148/78.  No aortic valve gradient.  Left ventriculogram:  There is moderate akinesis of the inferior wall. Ejection fraction is calculated at 50%.  There is no mitral regurgitation.  Coronary arteriography (right dominant):  Left main is normal.  Left anterior descending artery has a tubular 30% stenosis in the proximal vessel and a diffuse 20% stenosis in the distal vessel.  The LAD gives rise to a small first diagonal and a large second diagonal.  Left circumflex has a 40% stenosis proximally, 30% stenosis in the mid vessel, and a 50% stenosis in the distal vessel which is a very small portion of vessel proximal to a small obtuse marginal branch.  The circumflex gives rise to a  normal-sized ramus intermedius which has a 30% stenosis proximally.  There is a large first marginal branch which has a 40% stenosis proximally.  The second and third marginal branches are small.  Right coronary artery is a dominant vessel and has significant tortuosity in the proximal and mid vessel.  In the proximal vessel is a 20% stenosis followed by an 80% stenosis on a shepherds crook type bend.  Beyond this in the early portion of the mid vessel is a 40% stenosis.  The distal vessel has 100% occlusion with TIMI 0 flow.  After reperfusion was established, there was seen to be a 40% stenosis in the distal right coronary artery before the posterior descending artery and a diffuse 70% stenosis after the posterior descending artery.  There is a small posterior lateral branch which arises proximally to the posterior descending artery which has an 80% stenosis at its origin.  The posterior descending artery is large in size and has a 50% stenosis in the mid vessel.  There is a small first posterolateral branch after the PDA and a normal-sized second posterolateral branch.  IMPRESSIONS: 1. Mildly decreased left ventricular systolic function secondary to an acute    inferior myocardial infarction. 2. Moderate but nonobstructive disease in the left anterior descending artery  and left circumflex artery. 3. An acute inferior myocardial infarction secondary to a total occlusion in    the distal right coronary artery.  There is also significant disease in the    proximal right coronary artery.  PLAN:  Percutaneous intervention of the right coronary artery.  See below.  PERCUTANEOUS TRANSLUMINAL CORONARY ANGIOPLASTY PROCEDURE:  Following completion of the diagnostic catheterization, we proceeded directly with coronary intervention.  Heparin and ReoPro were administered per protocol. We used a 7 [place] JL-4 guiding catheter with side holes.  A high torque floppy wire was advanced into the  distal vessel and successfully passed beyond the lesion reestablishing perfusion into the distal vessel.  We then positioned a 2.5 x 15 mm Maverick balloon across the infarct lesion in the distal vessel and inflated this to 8 atmospheres.  This balloon was then pulled back and positioned across the lesion in the proximal vessel and inflated to 10 atmospheres.  We then deployed a 2.75 x 13 mm BX Velocity heparin-coated stent in the distal vessel.  The stent was deployed at 13 atmospheres.  This stent was then pulse dilated with a 2.75 x 12 mm Quantum Ranger balloon which was inflated to 18 atmospheres.  We then deployed a 3.0 x 15 mm Penta stent in the proximal vessel at a deployment pressure of 14 atmospheres.  Intermittent doses of nitroglycerin and Verapamil were administered between balloon inflations.  Final angiographic images revealed patency of the right coronary artery with 0% residual stenosis in both the distal and proximal sites and TIMI 3 flow into the distal vessel.  COMPLICATIONS:  None.  RESULTS: 1. Successful PTCA with stent placement in the distal right coronary artery    reducing 100% occlusion to 0% residual with TIMI 3 flow. 2. Successful PTCA with stent placement in the proximal right coronary artery    reducing an 80% stenosis to 0% residual with TIMI 3 flow.  PLAN:  ReoPro will be continued for 12 hours.  Plavix will be adminnistered for four weeks. DD:  01/17/00 TD:  01/17/00 Job: [id number]

## 2010-06-01 NOTE — Discharge Summary (Signed)
NAME:  [name] Ray, [name] Ray                         ACCOUNT NO.:  [id number]   MEDICAL RECORD NO.:  [id number]                   PATIENT TYPE:  INP   LOCATION:  6525                                 FACILITY:  [facility]   PHYSICIAN:  Willa Rough, M.D.                  DATE OF BIRTH:  January 06, 1934   DATE OF ADMISSION:  09/14/2002  DATE OF DISCHARGE:  09/16/2002                           DISCHARGE SUMMARY - REFERRING   HISTORY OF PRESENT ILLNESS:  [name] is a 77 year old male patient who is  admitted to [facility] on September 14, 2002, complaining of dyspnea on  exertion.  On the morning of admission he had a slight episode of chest pain  after eating.  He was seen in the office and felt to be unstable.  He was  admitted to [facility] but ruled out for myocardial infarction.  He  eventually underwent cardiac catheterization on September 15, 2002 and he was  found to have the following:  left main mild disease, the LAD proximal 90%,  left circumflex 60% OM, ramus intermedius 30-40% stenosis, RCA 60% proximal  in stent restenosis with a 50% mid and 50% distal in stent restenosis.  The  patient underwent PTTS/stent placement to the proximal LAD, decreasing a 90%  lesion to a 0% lesion post procedure.  A Taxus stent was placed.  The  patient will need to be maintained on Plavix for greater than or equal to  six months.  One consideration would be to consider Cardiolite at follow-up  to reassess the RCA for ischemia.  The patient's EF was 55% with no Mitral  regurgitation.   LABORATORY DATA:  Lab studies during the patient's stay included negative  cardiac isoenzyme 7139, potassium 4.3, BUN 12, creatinine 0.9.  White count  6.6, hemoglobin 14.3, hematocrit 42.7, platelets 163.   EKG reveals normal sinus bradycardia, right 50, with no acute ST/T-wave  changes.   DISCHARGE DIAGNOSES:  1. Coronary artery disease, status post PTCA to the right coronary artery.     Residual RCA disease.  2.  Diabetes mellitus.  He is instructed to hold his metformin until     September 19, 2002.  3. Hyperlipidemia, treated.  4. Gastroesophageal reflux disease.  5. Irritable bowel.  6. History of edematous polyps.  7. Diverticulosis.  8. History of fatty liver.  9. Ejection fraction greater than 59%.   DISPOSITION:  The patient is discharged to home in stable condition on the  following medications:   DISCHARGE MEDICATIONS:  1. Metformin 500 mg b.i.d.  He is to restart this on Sunday, September 5,     20 04.  2. He is to resume his Lovastatin at 10 mg a day.  3. Enteric-coated aspirin 325 mg a day.  4. Altace 10 mg a day.  5. Sublingual nitroglycerin p.r.n. chest pain.  6. Plavix 75 mg one p.o. daily for greater than or equal  to six months.  7. Tylenol 1-2 tablets every six hours as needed for pain.   ACTIVITY:  No strenuous activity for two days, then gradually increase  activity.   DIET:  Remain on low fat, low cholesterol diet.   WOUND CARE:  Clean catheterization site with soap and water.   FOLLOW UP:  Call for any questions or concerns.  He has an appointment to  follow up with Dr. Myrtis Ser on October 08, 2002 at 10:15 a.m.          Guy Franco, P.A. [facility]                      Willa Rough, M.D.    LB/MEDQ  D:  09/16/2002  T:  09/16/2002  Job:  [id number]   cc:   Gaspar Garbe, M.D.  [address]  [place]  [place] [address]  Fax: [phone]   Willa Rough, M.D.

## 2010-06-01 NOTE — Assessment & Plan Note (Signed)
[facility]                           GASTROENTEROLOGY OFFICE NOTE   NAME:SNIDERGedalya, Jim                      MRN:          [id number]  DATE:11/20/2005                            DOB:          05-04-1933    REFERRING PHYSICIAN:  Gaspar Garbe, M.D.   REASON FOR CONSULTATION:  Surveillance colonoscopy.   HISTORY:  This is a pleasant 77 year old white male with a history of  coronary artery disease, status post myocardial infarction, status post  prior drug-eluting coronary stent placement x3, hypertension and diabetes  mellitus.  He is on chronic aspirin and Plavix.  He also has a history of  adenomatous colon polyps and underwent colonoscopy with polypectomy in 2000  as well as 2003.  Followup was recommended last year.  However, due to some  cardiac issues, his planned exam has been postponed until this time.  In  addition to the above mentioned problems, he also has a history of reflux  disease, irritable bowel syndrome, hyperlipidemia and incidental  diverticulosis.  From a GI standpoint, he seems to be doing well.  No  nausea, vomiting or abdominal pain.  Some diarrhea in the afternoons.  No  evidence of GI bleeding.  His appetite and weight have been stable.   PAST MEDICAL HISTORY:  As above.   ALLERGIES:  No known drug allergies.   CURRENT MEDICATIONS:  1. Plavix 75 mg daily.  2. Altace 10 mg daily.  3. Aspirin 325 mg daily.  4. Omeprazole 20 mg daily.  5. Metformin 1000 mg in the morning and 500 mg at night.  6. Glucosamine b.i.d.  7. Multivitamin.  8. Calcium with vitamin D.  9. Vytorin 10/20 mg daily.  10.Sublingual nitroglycerin p.r.n.   FAMILY HISTORY:  No family history of gastrointestinal malignancy.   SOCIAL HISTORY:  The patient is married with 3 children, lives with his  wife.  He has a high school education.  He has worked as a Building services engineer.  He does not smoke or use alcohol.   REVIEW OF SYSTEMS:   Per diagnostic evaluation form.   PHYSICAL EXAMINATION:  GENERAL:  Well-appearing male in no acute distress.  VITAL SIGNS:  Blood pressure 124/80, heart rate is 60 and regular, weight is  227 pounds.  HEENT:  Sclerae are anicteric.  Conjunctivae are pink.  Oral mucosa is  intact.  No adenopathy.  LUNGS:  Clear.  HEART:  Regular.  ABDOMEN:  Soft without tenderness, mass or hernia.  Good bowel sounds heard.   IMPRESSION:  This is a 77 year old gentleman with multiple significant  medical problems, including coronary artery disease, for which he has had  prior coronary artery stents, and is on lifetime Plavix and aspirin, as well  as diabetes mellitus.  He presents regarding colonic polyp surveillance,  having a history of colonic adenomas.  He is an appropriate candidate  without contraindication.  The nature of the procedure as well as the risks,  benefits and alternatives have been reviewed.  He understood and agreed to  proceed.  We also discussed the possibility of performing his exam  on or off  Plavix therapy.  We discussed the pros and cons of each situation.  We have  decided to proceed with his exam on Plavix, understanding that significant  pathology might not be able to be dealt with at that time, though minor  pathology could be dealt with.  In addition to this consideration, we will  hold his oral diabetic agents the day of the exam.  For his reflux disease  he will continue on proton pump inhibitor therapy.     Wilhemina Bonito. Eda Keys., MD  Electronically Signed    JNP/MedQ  DD: 11/21/2005  DT: 11/21/2005  Job #: [id number]   cc:   Gaspar Garbe, M.D.  Luis Abed, MD, [facility]

## 2010-06-01 NOTE — Assessment & Plan Note (Signed)
[facility]                            CARDIOLOGY OFFICE NOTE   NAME:[name] Ray, [name] Ray                      MRN:          [id number]  DATE:12/16/2005                            DOB:          1933-03-30    [name] Ray is doing well.  He is not having any significant symptoms.  He has seen Dr. Marina Goodell and is in fact having a colonoscopy tomorrow.  Dr.  Marina Goodell did a very nice job of reviewing the issue of his Plavix.  They  have decided to proceed with the patient on Plavix.   [name] Ray is going about his usual activities.  He has not had chest  pain, shortness of breath, syncope or presyncope.  As documented, he  does have significant coronary disease.   PAST MEDICAL HISTORY:  Allergies:  No known drug allergies.   Medications:  1. Plavix 75.  2. Altace 10.  3. Aspirin 325.  4. Omeprazole 20.  5. Metformin 500 b.i.d.  6. Glucose.  7. Multivitamin.  8. Calcium.  9. Vytorin 10/20.   Other medical problems:  See the list below.   REVIEW OF SYSTEMS:  He is feeling well and his review of systems is  negative.   PHYSICAL EXAMINATION:  VITAL SIGNS:  His weight today is 228 pounds,  which is similar but up 1 pound from his prior visit.  GENERAL:  The patient is oriented x3.  Affect is normal.  HEENT:  Reveals no xanthelasma.  He has normal extraocular motion.  There are no carotid bruits.  There is no jugular venous distention.  CARDIAC:  Reveals an S1 with an S2.  There are no clicks or significant  murmurs.  ABDOMEN:  Obese but soft.  There are no masses or bruits.  He has no  significant peripheral edema.  There are no major musculoskeletal  deformities.  LUNGS:  Clear.  Respiratory effort is not labored.   EKG reveals mild sinus bradycardia.   PROBLEMS:  1. Gastroesophageal reflux disease.  2. Irritable bowel.  3. History of adenomatous polyps.  4. Diverticulosis.  5. History of fatty liver.  6. Diabetes.  7. Hypercholesterolemia.  8.  History of ejection fraction in the 50% range.  9. History of some atrial bigeminy.  10.Lifetime Plavix.  11.Coronary artery disease.  The patient had an acute inferior      myocardial infarction in January 2002 treated with a stent.  In      2004 he had a Taxus stent to the LAD and in May 2006 he had a      Cypher stent to the proximal edge of the LAD and a Cypher stent to      his mid circumflex.   He is doing well.  I am in agreement with doing his colonoscopy on  Plavix.  If we need to proceed later with a procedure off Plavix, this  can be dealt with hopefully.  The patient needs to be on his Plavix but  he is greater than 1 year since his last intervention.     Luis Abed, MD,  [facility]  Electronically Signed    JDK/MedQ  DD: 12/16/2005  DT: 12/16/2005  Job #: [id number]   cc:   Gaspar Garbe, M.D.

## 2010-06-01 NOTE — Cardiovascular Report (Signed)
NAME:  [name] Ray, [name] Ray NO.:  [id number]   MEDICAL RECORD NO.:  [id number]          PATIENT TYPE:  OIB   LOCATION:  6501                         FACILITY:  [facility]   PHYSICIAN:  Learta Codding, M.D. LHCDATE OF BIRTH:  11-Nov-1933   DATE OF PROCEDURE:  DATE OF DISCHARGE:  05/21/2004                              CARDIAC CATHETERIZATION   PROCEDURE PERFORMED:  1.  Left heart catheterization.  2.  Ventriculography.   DIAGNOSES:  1.  High grade left anterior descending artery stenosis.  2.  Circumflex stenosis.   INDICATION:  The patient is a 77 year old male with a history of previous  anterior wall myocardial infarction, status post PCI and previous stenting  with drug-eluting stent to the proximal LAD, bare metal stent in the right  coronary artery.  The patient reports recurrence of substernal  chest pain.   DESCRIPTION OF PROCEDURE:  After informed consent was obtained, the patient  was brought to the [facility] lab.  Standard JR4 and JL4 catheters were used for  catheterization and a pigtail for ventriculography.  No complications were  encountered during the procedure.   FINDINGS:  1.  The left main coronary artery had no high grade stenosis.  2.  LAD had a 90% stenosis just proximal to the previous stent.  3.  Circumflex coronary artery had a 90% lesion in the mid vessel.  4.  Right coronary artery has two stents in place which remain patent.   CONCLUSIONS:  Angiographic images were reviewed with Dr. Juanda Chance.  It was  felt that the lesions in the left anterior descending artery and circumflex  were too high grade to send the patient home.  The patient was then  transferred to the inpatient lab for further intervention to the above  mentioned lesions.      Learta Codding, M.D. [facility]  Electronically Signed     GED/MEDQ  D:  10/27/2004  T:  10/27/2004  Job:  [id number]

## 2010-06-25 ENCOUNTER — Encounter: Payer: Self-pay | Admitting: Cardiology

## 2010-08-14 ENCOUNTER — Ambulatory Visit: Payer: Self-pay | Admitting: Cardiology

## 2010-08-29 ENCOUNTER — Encounter: Payer: Self-pay | Admitting: Cardiology

## 2010-09-04 ENCOUNTER — Encounter: Payer: Self-pay | Admitting: Cardiology

## 2010-09-04 DIAGNOSIS — I2510 Atherosclerotic heart disease of native coronary artery without angina pectoris: Secondary | ICD-10-CM | POA: Insufficient documentation

## 2010-09-04 DIAGNOSIS — R202 Paresthesia of skin: Secondary | ICD-10-CM

## 2010-09-04 DIAGNOSIS — E785 Hyperlipidemia, unspecified: Secondary | ICD-10-CM | POA: Insufficient documentation

## 2010-09-04 DIAGNOSIS — K219 Gastro-esophageal reflux disease without esophagitis: Secondary | ICD-10-CM | POA: Insufficient documentation

## 2010-09-04 DIAGNOSIS — R200 Anesthesia of skin: Secondary | ICD-10-CM | POA: Insufficient documentation

## 2010-09-04 DIAGNOSIS — I10 Essential (primary) hypertension: Secondary | ICD-10-CM | POA: Insufficient documentation

## 2010-09-07 ENCOUNTER — Ambulatory Visit (INDEPENDENT_AMBULATORY_CARE_PROVIDER_SITE_OTHER): Payer: Medicare Other | Admitting: Cardiology

## 2010-09-07 ENCOUNTER — Encounter: Payer: Self-pay | Admitting: Cardiology

## 2010-09-07 DIAGNOSIS — I2510 Atherosclerotic heart disease of native coronary artery without angina pectoris: Secondary | ICD-10-CM

## 2010-09-07 DIAGNOSIS — I10 Essential (primary) hypertension: Secondary | ICD-10-CM

## 2010-09-07 DIAGNOSIS — E785 Hyperlipidemia, unspecified: Secondary | ICD-10-CM

## 2010-09-07 DIAGNOSIS — I739 Peripheral vascular disease, unspecified: Secondary | ICD-10-CM

## 2010-09-07 DIAGNOSIS — I779 Disorder of arteries and arterioles, unspecified: Secondary | ICD-10-CM

## 2010-09-07 NOTE — Assessment & Plan Note (Signed)
His labs were checked recently and he has excellent control.  No change in therapy.

## 2010-09-07 NOTE — Assessment & Plan Note (Signed)
Blood pressure is controlled. No change in therapy. 

## 2010-09-07 NOTE — Assessment & Plan Note (Signed)
The patient does have carotid disease.  This will be followed carefully.

## 2010-09-07 NOTE — Progress Notes (Signed)
HPI Patient is seen for followup of coronary artery disease.  He is doing well.  I saw him last October, 2011.  He had undergone catheterization in September, 2011.There was mild to moderate diffuse disease of the right.  There was moderate discrete LAD stenosis before the stent.  It was not flow limiting.  Medical therapy was recommended.  I heard a carotid bruit.  Doppler was done January, 2012.  He does have 40-59% bilateral disease in lead one year followup.  He saw his primary physician recently and he brought me a copy of the labs.  His renal function is good.  His LDL is 63 and his HDL is 43.  triglycerides are 70.  These labs are quite good.  He is not having any significant chest pain.  As part of evaluation today I have reviewed all of his old records.  I have updated the current electronic medical record carefully. Allergies  Allergen Reactions  . Atorvastatin   . Rosuvastatin     REACTION: unknown    Current Outpatient Prescriptions  Medication Sig Dispense Refill  . amLODipine (NORVASC) 10 MG tablet Take 10 mg by mouth daily.        Marland Kitchen aspirin 325 MG EC tablet Take 325 mg by mouth daily.        . clopidogrel (PLAVIX) 75 MG tablet Take 75 mg by mouth daily.        . Glucosamine-Chondroit-Vit C-Mn (GLUCOSAMINE 1500 COMPLEX PO) Take 1 tablet by mouth daily.        Marland Kitchen loratadine (CLARITIN) 10 MG tablet Take 10 mg by mouth as needed.        . nitroGLYCERIN (NITROSTAT) 0.4 MG SL tablet Place 0.4 mg under the tongue every 5 (five) minutes as needed.        Marland Kitchen omeprazole (PRILOSEC) 20 MG capsule Take 20 mg by mouth daily. Take 30 minutes before meals       . ramipril (ALTACE) 10 MG capsule Take 10 mg by mouth daily.        . simvastatin (ZOCOR) 20 MG tablet Take 20 mg by mouth at bedtime.        . sitaGLIPtan-metformin (JANUMET) 50-500 MG per tablet Take 1 tablet by mouth daily.          History   Social History  . Marital Status: Married    Spouse Name: N/A    Number of Children: N/A   . Years of Education: N/A   Occupational History  . Not on file.   Social History Main Topics  . Smoking status: Never Smoker   . Smokeless tobacco: Not on file  . Alcohol Use: No  . Drug Use: No  . Sexually Active:    Other Topics Concern  . Not on file   Social History Narrative  . No narrative on file    Family History  Problem Relation Age of Onset  . Heart attack Paternal Grandfather     Past Medical History  Diagnosis Date  . GERD (gastroesophageal reflux disease)   . IBS (irritable bowel syndrome)   . Diabetes mellitus   . Hypertension   . Adenomatous polyp     history  . Diverticulosis   . Fatty liver     history  . Dyslipidemia   . Atrial bigeminy     in the past  . Coronary artery disease     acute inferior MI Jan 2002 with stent / Taxus stent LAD 2004 / Cypher  stent prox LAD and cypher stent mid circ May 2006 / nuclear stress Dec 2009 normal EF 63% / nuclear August 2011 EF 62% / inferior thining and very mild ischemia inferior and apex / catherization Sept 2011 EF 60%  mild to moderate diffuse RCA disease, moderate discrete LAD stenosis before the stent in the proximal LAD med tx rec  . Numbness and tingling in hands     resolved  . BPH (benign prostatic hyperplasia)   . Colon polyps   . Carotid artery disease     Doppler, January, 2012, 40-59% bilateral stenoses, follow up one year  . Overweight     Past Surgical History  Procedure Date  . Cardiac catheterization     multiple stents in LAD    ROS  Patient denies fever, chills, headache, sweats, rash, change in vision, change in hearing, chest pain, cough, nausea vomiting, urinary symptoms.  All other systems are reviewed and are negative.  PHYSICAL EXAM Patient is stable today.  He is oriented to person time and place.  Affect is normal.  Head is atraumatic.  There is no jugular venous distention.  Lungs are clear.  Respiratory effort is nonlabored.  Cardiac exam reveals S1-S2.  There are no  clicks or significant murmurs.  The rhythm is atrial bigeminy.  Abdomen is soft.  There is no peripheral edema.  There no musculoskeletal deformities.  There no skin rashes.  The patient is overweight.  Filed Vitals:   09/07/10 1147  BP: 114/62  Pulse: 66  Height: 5\' 6"  (1.676 m)  Weight: 220 lb (99.791 kg)    EKG Is done today.  The patient bigeminy.  This does not require any further workup or change in therapy.  ASSESSMENT & PLAN

## 2010-09-07 NOTE — Assessment & Plan Note (Addendum)
Coronary disease is stable.  He does not need further workup.  Followup in one year. His EKG reveals atrial bigeminy.  This does not require any workup or change in therapy.

## 2010-09-07 NOTE — Patient Instructions (Signed)
Your physician recommends that you schedule a follow-up appointment in: 12 months.  

## 2011-02-21 ENCOUNTER — Other Ambulatory Visit: Payer: Self-pay | Admitting: Cardiology

## 2011-02-21 DIAGNOSIS — I6529 Occlusion and stenosis of unspecified carotid artery: Secondary | ICD-10-CM

## 2011-02-22 ENCOUNTER — Encounter (INDEPENDENT_AMBULATORY_CARE_PROVIDER_SITE_OTHER): Payer: Medicare Other | Admitting: *Deleted

## 2011-02-22 DIAGNOSIS — I6529 Occlusion and stenosis of unspecified carotid artery: Secondary | ICD-10-CM

## 2011-09-18 ENCOUNTER — Ambulatory Visit: Payer: Medicare Other | Admitting: Cardiology

## 2011-09-23 ENCOUNTER — Encounter: Payer: Self-pay | Admitting: Cardiology

## 2011-09-23 ENCOUNTER — Ambulatory Visit (INDEPENDENT_AMBULATORY_CARE_PROVIDER_SITE_OTHER): Payer: Medicare Other | Admitting: Cardiology

## 2011-09-23 VITALS — BP 145/76 | HR 68 | Ht 66.0 in | Wt 217.0 lb

## 2011-09-23 DIAGNOSIS — I779 Disorder of arteries and arterioles, unspecified: Secondary | ICD-10-CM

## 2011-09-23 DIAGNOSIS — I10 Essential (primary) hypertension: Secondary | ICD-10-CM

## 2011-09-23 DIAGNOSIS — I2510 Atherosclerotic heart disease of native coronary artery without angina pectoris: Secondary | ICD-10-CM

## 2011-09-23 NOTE — Assessment & Plan Note (Signed)
There is slight elevation of the systolic blood pressure today. He will check his pressure at home and let us know if the systolic pressures above 140.

## 2011-09-23 NOTE — Patient Instructions (Addendum)
Your physician wants you to follow-up in: 1 year. You will receive a reminder letter in the mail two months in advance. If you don't receive a letter, please call our office to schedule the follow-up appointment.  

## 2011-09-23 NOTE — Assessment & Plan Note (Signed)
Coronary disease is stable. No further workup is needed at this time. 

## 2011-09-23 NOTE — Assessment & Plan Note (Signed)
Carotid artery disease is stable. He'll have another followup Doppler in 1 year.

## 2011-09-23 NOTE — Progress Notes (Signed)
HPI   Patient is seen for followup of coronary disease. He is doing well. I saw him last August, 2012. His last catheterization was in September, 2011. He has mild to moderate diffuse disease of the right. There is moderate discrete LAD stenosis before the stent. Medical therapy is recommended. I have kept him on aspirin and Plavix. He has carotid artery disease and has been followed. His most recent Doppler in February, 2013 shows stable disease.  Allergies  Allergen Reactions  . Atorvastatin   . Rosuvastatin     REACTION: unknown    Current Outpatient Prescriptions  Medication Sig Dispense Refill  . amLODipine (NORVASC) 10 MG tablet Take 10 mg by mouth daily.        Marland Kitchen aspirin 325 MG EC tablet Take 325 mg by mouth daily.        . clopidogrel (PLAVIX) 75 MG tablet Take 75 mg by mouth daily.        . Glucosamine-Chondroit-Vit C-Mn (GLUCOSAMINE 1500 COMPLEX PO) Take 1 tablet by mouth 2 (two) times daily.       Marland Kitchen KOMBIGLYZE XR 2.05-998 MG TB24 Take 1 tablet by mouth BID times 48H.      Marland Kitchen loratadine (CLARITIN) 10 MG tablet Take 10 mg by mouth as needed.        . nitroGLYCERIN (NITROSTAT) 0.4 MG SL tablet Place 0.4 mg under the tongue every 5 (five) minutes as needed.        Marland Kitchen omeprazole (PRILOSEC) 20 MG capsule Take 20 mg by mouth as needed. Take 30 minutes before meals      . ramipril (ALTACE) 10 MG capsule Take 10 mg by mouth daily.        . simvastatin (ZOCOR) 20 MG tablet Take 20 mg by mouth at bedtime.          History   Social History  . Marital Status: Married    Spouse Name: N/A    Number of Children: N/A  . Years of Education: N/A   Occupational History  . Not on file.   Social History Main Topics  . Smoking status: Never Smoker   . Smokeless tobacco: Not on file  . Alcohol Use: No  . Drug Use: No  . Sexually Active:    Other Topics Concern  . Not on file   Social History Narrative  . No narrative on file    Family History  Problem Relation Age of Onset  .  Heart attack Paternal Grandfather     Past Medical History  Diagnosis Date  . GERD (gastroesophageal reflux disease)   . IBS (irritable bowel syndrome)   . Diabetes mellitus   . Hypertension   . Adenomatous polyp     history  . Diverticulosis   . Fatty liver     history  . Dyslipidemia   . Atrial bigeminy     in the past  . Coronary artery disease     acute inferior MI Jan 2002 with stent / Taxus stent LAD 2004 / Cypher stent prox LAD and cypher stent mid circ May 2006 / nuclear stress Dec 2009 normal EF 63% / nuclear August 2011 EF 62% / inferior thining and very mild ischemia inferior and apex / catherization Sept 2011 EF 60%  mild to moderate diffuse RCA disease, moderate discrete LAD stenosis before the stent in the proximal LAD med tx rec  . Numbness and tingling in hands     resolved  . BPH (benign prostatic hyperplasia)   .  Colon polyps   . Carotid artery disease     Doppler, January, 2012, 40-59% bilateral stenoses, follow up one year  . Overweight     Past Surgical History  Procedure Date  . Cardiac catheterization     multiple stents in LAD    ROS   Patient denies fever, chills, headache, sweats, rash, change in vision, change in hearing, chest pain, cough, nausea vomiting, urinary symptoms. All other systems are reviewed and are negative.  PHYSICAL EXAM    Patient is oriented to person time and place. Affect is normal. There is no jugular venous distention. Lungs are clear. Respiratory effort is nonlabored. Cardiac exam reveals S1 and S2. There no clicks or significant murmurs. The abdomen is soft. Is no peripheral edema.  Filed Vitals:   09/23/11 0849  BP: 145/76  Pulse: 68  Height: 5\' 6"  (1.676 m)  Weight: 217 lb (98.431 kg)    EKG   EKG is done reviewed by me. It is normal. There is no significant change from the past.  ASSESSMENT & PLAN

## 2012-03-12 ENCOUNTER — Telehealth: Payer: Self-pay | Admitting: Cardiology

## 2012-03-12 NOTE — Telephone Encounter (Signed)
Appt scheduled with Tereso Newcomer PA-C on Tuesday, 03/17/12 at 12:10 pm for surgical clearance.

## 2012-03-12 NOTE — Telephone Encounter (Signed)
New problem   Pt is having surgery on his hand 03/16/12 and was told by [facility] that he need to come in and see Dr Myrtis Ser before he can have surgery.I advise pt that Dr wasn't in office and if he just need a note I didn't know if we could give that to him without him being seen.

## 2012-03-12 NOTE — Telephone Encounter (Signed)
[facility] needs the office note with clearance for hand surgery faxed to: [phone] Attn:Sarah

## 2012-03-12 NOTE — Telephone Encounter (Signed)
Pt was notified that Dr Myrtis Ser is out of the office this week.  I called the surgery center to find out exactly what he needs.  Awaiting a return call from them.

## 2012-03-12 NOTE — Telephone Encounter (Signed)
According to the [facility], anesthesia is requiring an office visit prior to surgery for Mr [name] Ray since he told them that he last had chest pain 3 weeks ago.

## 2012-03-17 ENCOUNTER — Encounter: Payer: Self-pay | Admitting: Physician Assistant

## 2012-03-17 ENCOUNTER — Ambulatory Visit (INDEPENDENT_AMBULATORY_CARE_PROVIDER_SITE_OTHER): Payer: Medicare Other | Admitting: Physician Assistant

## 2012-03-17 VITALS — BP 130/60 | HR 67 | Ht 66.0 in | Wt 204.0 lb

## 2012-03-17 DIAGNOSIS — E785 Hyperlipidemia, unspecified: Secondary | ICD-10-CM

## 2012-03-17 DIAGNOSIS — Z01810 Encounter for preprocedural cardiovascular examination: Secondary | ICD-10-CM

## 2012-03-17 DIAGNOSIS — I10 Essential (primary) hypertension: Secondary | ICD-10-CM

## 2012-03-17 DIAGNOSIS — I2510 Atherosclerotic heart disease of native coronary artery without angina pectoris: Secondary | ICD-10-CM

## 2012-03-17 DIAGNOSIS — I6529 Occlusion and stenosis of unspecified carotid artery: Secondary | ICD-10-CM

## 2012-03-17 NOTE — Progress Notes (Signed)
[address], [address] [place], [place]  [address] Phone: [phone], Fax:  [phone]  Date:  03/17/2012   ID:  [name] Ray, DOB August 27, 1933, MRN [id number]  PCP:  Gaspar Garbe, MD  Primary Cardiologist:  Dr. Zackery Barefoot     History of Present Illness: [name] Ray is a 79 y.o. male who returns today for surgical clearance.  He needs carpal tunnel surgery with Dr. Mina Marble.  Patient tells me this will be under general anesthesia.  He has a hx of CAD, s/p inferior MI in 1/02 treated with PCI, s/p Taxus DES to the LAD in 2004, s/p Cypher DES to the proximal LAD and Cypher DES to the mid CFX 5/06, DM2, HTN, HL, carotid stenosis, BPH. Myoview 8/11: Inferior thinning with very mild ischemia in the inferior wall and apex, EF 62%. LHC 9/11: EF 60%, proximal RCA stent patent with 30-40% ISR, mid RCA 30-40%, 40% prior to bifurcation into PLV and PDA, ostial ramus 30%, circumflex 30%, mid to distal circumflex stents patent with minimal ISR, proximal LAD stent patent with mild ISR, proximal LAD before the stent 50-60% (not flow-limiting). Medical therapy was continued. Dopplers 2/13: 40-59% bilateral (followup due 2/14). Last seen by Dr. Myrtis Ser 9/13.  Since then, he has done well.  He works out at [employer] 2 x per week.  Walks on a NuStep and a treadmill without chest pain or dyspnea.  States he goes 2.5 mph at 2% grade on the treadmill.  He can achieve 4 METs or greater without his typical anginal symptoms.  No syncope.  No orthopnea, PND, edema.  He is NYHA Class II.  He tore his hamstring shoveling snow a few weeks ago and has had to decrease activity since.    Labs (9/11):  K 4.8, creatinine 0.9, Hgb 14.3  Wt Readings from Last 3 Encounters:  03/17/12 204 lb (92.534 kg)  09/23/11 217 lb (98.431 kg)  09/07/10 220 lb (99.791 kg)     Past Medical History  Diagnosis Date  . GERD (gastroesophageal reflux disease)   . IBS (irritable bowel syndrome)   . Diabetes mellitus   .  Hypertension   . Adenomatous polyp     history  . Diverticulosis   . Fatty liver     history  . Dyslipidemia   . Atrial bigeminy     in the past  . Coronary artery disease     acute inferior MI Jan 2002 with stent / Taxus stent LAD 2004 / Cypher stent prox LAD and cypher stent mid circ May 2006 / nuclear stress Dec 2009 normal EF 63% / nuclear August 2011 EF 62% / inferior thining and very mild ischemia inferior and apex / catherization Sept 2011 EF 60%  mild to moderate diffuse RCA disease, moderate discrete LAD stenosis before the stent in the proximal LAD med tx rec  . Numbness and tingling in hands     resolved  . BPH (benign prostatic hyperplasia)   . Colon polyps   . Carotid artery disease     Doppler, January, 2012, 40-59% bilateral stenoses, follow up one year  . Overweight     Current Outpatient Prescriptions  Medication Sig Dispense Refill  . amLODipine (NORVASC) 10 MG tablet Take 10 mg by mouth daily.        Marland Kitchen aspirin 325 MG EC tablet Take 325 mg by mouth daily.        . clopidogrel (PLAVIX) 75 MG tablet Take 75 mg  by mouth daily.        . Glucosamine-Chondroit-Vit C-Mn (GLUCOSAMINE 1500 COMPLEX PO) Take 1 tablet by mouth 2 (two) times daily.       Marland Kitchen KOMBIGLYZE XR 2.05-998 MG TB24 Take 1 tablet by mouth BID times 48H.      Marland Kitchen loratadine (CLARITIN) 10 MG tablet Take 10 mg by mouth as needed.        . nitroGLYCERIN (NITROSTAT) 0.4 MG SL tablet Place 0.4 mg under the tongue every 5 (five) minutes as needed.        Marland Kitchen omeprazole (PRILOSEC) 20 MG capsule Take 20 mg by mouth as needed. Take 30 minutes before meals      . ramipril (ALTACE) 10 MG capsule Take 10 mg by mouth daily.        . simvastatin (ZOCOR) 20 MG tablet Take 20 mg by mouth at bedtime.         No current facility-administered medications for this visit.    Allergies:    Allergies  Allergen Reactions  . Atorvastatin   . Rosuvastatin     REACTION: unknown    Social History:  The patient  reports that he  has never smoked. He does not have any smokeless tobacco history on file. He reports that he does not drink alcohol or use illicit drugs.   ROS:  Please see the history of present illness.      All other systems reviewed and negative.   PHYSICAL EXAM: VS:  BP 130/60  Pulse 67  Ht 5\' 6"  (1.676 m)  Wt 204 lb (92.534 kg)  BMI 32.94 kg/m2 Well nourished, well developed, in no acute distress HEENT: normal Neck: no JVD Cardiac:  normal S1, S2; RRR; no murmur Lungs:  clear to auscultation bilaterally, no wheezing, rhonchi or rales Abd: soft, nontender, no hepatomegaly Ext: no edema Skin: warm and dry Neuro:  CNs 2-12 intact, no focal abnormalities noted  EKG:  NSR, HR 67, normal axis, no acute changes     ASSESSMENT AND PLAN:  1. Surgical Clearance:  He does not have any unstable cardiac conditions.  He is able to achieve 4 METs or greater without symptoms of angina.  His procedure should be low risk.  According to ACC/AHA guidelines, he does not require any further cardiovascular testing prior to his non-cardiac procedure.  He should be at acceptable risk.  He has had multiple PCI procedures in the past and has several 1st generation drug eluting stents.  We recommend he have his procedure while remaining on ASA and Plavix.  I reviewed this with Dr. Delane Ginger (DOD) and he agreed.   2. Coronary Artery Disease:  Stable.  Continue ASA, Plavix and statin. 3. Hypertension:  Controlled.  Continue current therapy. 4. Hyperlipidemia:  Managed by PCP.  5. Carotid Stenosis:  He is due for repeat dopplers.  Will schedule carotid dopplers.  6. Disposition:  Follow up with Dr. Zackery Barefoot in 09/2012 as planned.   Luna Glasgow, PA-C  12:27 PM 03/17/2012

## 2012-03-17 NOTE — Patient Instructions (Addendum)
Your physician has requested that you have a carotid duplex DX 433.10. This test is an ultrasound of the carotid arteries in your neck. It looks at blood flow through these arteries that supply the brain with blood. Allow one hour for this exam. There are no restrictions or special instructions.  Your physician wants you to follow-up in: 6 MONTHS WITH DR. KATZ. You will receive a reminder letter in the mail two months in advance. If you don't receive a letter, please call our office to schedule the follow-up appointment.

## 2012-03-19 ENCOUNTER — Telehealth: Payer: Self-pay | Admitting: Cardiology

## 2012-03-19 NOTE — Telephone Encounter (Signed)
Clearance faxed

## 2012-03-19 NOTE — Telephone Encounter (Signed)
[place] Dec with the surgical center calling re surgical clearance needing to faxed @ [phone]

## 2012-04-20 ENCOUNTER — Encounter (INDEPENDENT_AMBULATORY_CARE_PROVIDER_SITE_OTHER): Payer: Medicare Other

## 2012-04-20 DIAGNOSIS — I6529 Occlusion and stenosis of unspecified carotid artery: Secondary | ICD-10-CM

## 2012-04-22 ENCOUNTER — Encounter: Payer: Self-pay | Admitting: Physician Assistant

## 2012-04-24 ENCOUNTER — Telehealth: Payer: Self-pay | Admitting: *Deleted

## 2012-04-24 NOTE — Telephone Encounter (Signed)
pt notified about carotids results with verbal understanding today

## 2012-07-14 ENCOUNTER — Telehealth: Payer: Self-pay | Admitting: Cardiology

## 2012-07-14 NOTE — Telephone Encounter (Signed)
Returned call to patient he stated he is scheduled to have prostate biopsy 07/24/12 with Dr.Ron Earlene Plater at [facility], wanting to know if okay to hold plavix and aspirin.Dr.Katz out of office.Spoke to DOD Dr.Klein he advised ok to hold plavix and aspirin 5 days prior to biopsy.Note faxed to [employer] fax # [phone].

## 2012-07-14 NOTE — Telephone Encounter (Signed)
New Prob     Pt wanting to have a biopsy done next Friday and wants to know if he needs any type of clearance. Please call.

## 2012-10-22 ENCOUNTER — Encounter: Payer: Self-pay | Admitting: Cardiology

## 2012-10-23 ENCOUNTER — Encounter (INDEPENDENT_AMBULATORY_CARE_PROVIDER_SITE_OTHER): Payer: Self-pay

## 2012-10-23 ENCOUNTER — Encounter: Payer: Self-pay | Admitting: Cardiology

## 2012-10-23 ENCOUNTER — Ambulatory Visit (INDEPENDENT_AMBULATORY_CARE_PROVIDER_SITE_OTHER): Payer: Medicare Other | Admitting: Cardiology

## 2012-10-23 VITALS — BP 128/62 | HR 68 | Ht 66.0 in | Wt 193.8 lb

## 2012-10-23 DIAGNOSIS — I779 Disorder of arteries and arterioles, unspecified: Secondary | ICD-10-CM

## 2012-10-23 DIAGNOSIS — E785 Hyperlipidemia, unspecified: Secondary | ICD-10-CM

## 2012-10-23 DIAGNOSIS — I2510 Atherosclerotic heart disease of native coronary artery without angina pectoris: Secondary | ICD-10-CM

## 2012-10-23 DIAGNOSIS — I10 Essential (primary) hypertension: Secondary | ICD-10-CM

## 2012-10-23 NOTE — Assessment & Plan Note (Signed)
His carotid disease is being followed carefully. 

## 2012-10-23 NOTE — Assessment & Plan Note (Addendum)
With the new guidelines the recommendation would be changing to Lipitor 40 mg. I discussed this with him. He had severe symptoms from trying atorvastatin in the past. He wants to stay on his current medication. Consideration could be given to trying Crestor.

## 2012-10-23 NOTE — Assessment & Plan Note (Signed)
Coronary disease is stable. No change in therapy. 

## 2012-10-23 NOTE — Assessment & Plan Note (Signed)
Blood pressures control. No change in therapy. 

## 2012-10-23 NOTE — Patient Instructions (Signed)
Your physician recommends that you continue on your current medications as directed. Please refer to the Current Medication list given to you today.  Your physician wants you to follow-up in: 1 year. You will receive a reminder letter in the mail two months in advance. If you don't receive a letter, please call our office to schedule the follow-up appointment.  

## 2012-10-23 NOTE — Progress Notes (Signed)
HPI   Patient is seen to followup coronary disease. He is stable. I saw him last September, 2013. He was seen in our office by Mr. Alben Spittle in March, 2014. This was done to help clear him for his carpal tunnel surgery. This was done in the left wrist and was quite successful. He is feeling better. He still has significant symptoms in his right wrist.  He's not having any chest pain. He has lost some weight which is good.  Allergies  Allergen Reactions  . Atorvastatin   . Rosuvastatin     REACTION: unknown    Current Outpatient Prescriptions  Medication Sig Dispense Refill  . amLODipine (NORVASC) 10 MG tablet Take 10 mg by mouth daily.        Marland Kitchen aspirin 325 MG EC tablet Take 325 mg by mouth daily.        . clopidogrel (PLAVIX) 75 MG tablet Take 75 mg by mouth daily.        . Glucosamine-Chondroit-Vit C-Mn (GLUCOSAMINE 1500 COMPLEX PO) Take 1 tablet by mouth 2 (two) times daily.       Marland Kitchen KOMBIGLYZE XR 2.05-998 MG TB24 Take 1 tablet by mouth BID times 48H.      Marland Kitchen Leuprolide Acetate (LUPRON IJ) Inject 45 mg as directed every 6 (six) months.      . loratadine (CLARITIN) 10 MG tablet Take 10 mg by mouth as needed.        . nitroGLYCERIN (NITROSTAT) 0.4 MG SL tablet Place 0.4 mg under the tongue every 5 (five) minutes as needed.        Marland Kitchen omeprazole (PRILOSEC) 20 MG capsule Take 20 mg by mouth as needed. Take 30 minutes before meals      . ramipril (ALTACE) 10 MG capsule Take 10 mg by mouth daily.        . simvastatin (ZOCOR) 20 MG tablet Take 20 mg by mouth at bedtime.         No current facility-administered medications for this visit.    History   Social History  . Marital Status: Married    Spouse Name: N/A    Number of Children: N/A  . Years of Education: N/A   Occupational History  . Not on file.   Social History Main Topics  . Smoking status: Never Smoker   . Smokeless tobacco: Not on file  . Alcohol Use: No  . Drug Use: No  . Sexual Activity:    Other Topics Concern  .  Not on file   Social History Narrative  . No narrative on file    Family History  Problem Relation Age of Onset  . Heart attack Paternal Grandfather     Past Medical History  Diagnosis Date  . GERD (gastroesophageal reflux disease)   . IBS (irritable bowel syndrome)   . Diabetes mellitus   . Hypertension   . Adenomatous polyp     history  . Diverticulosis   . Fatty liver     history  . Dyslipidemia   . Atrial bigeminy     in the past  . Coronary artery disease     acute inferior MI Jan 2002 with stent / Taxus stent LAD 2004 / Cypher stent prox LAD and cypher stent mid circ May 2006 / nuclear stress Dec 2009 normal EF 63% / nuclear August 2011 EF 62% / inferior thining and very mild ischemia inferior and apex / catherization Sept 2011 EF 60%  mild to moderate diffuse RCA  disease, moderate discrete LAD stenosis before the stent in the proximal LAD med tx rec  . Numbness and tingling in hands     resolved  . BPH (benign prostatic hyperplasia)   . Colon polyps   . Carotid artery disease     a. Doppler, January, 2012, 40-59% bilateral stenoses;  b.  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 Overweight(278.02)     Past Surgical History  Procedure Laterality Date  . Cardiac catheterization      multiple stents in LAD    Patient Active Problem List   Diagnosis Date Noted  . Carotid artery disease     Priority: High  . Coronary artery disease     Priority: High  . Dyslipidemia     Priority: High  . Hypertension     Priority: High  . Numbness and tingling in hands     Priority: High  . GERD (gastroesophageal reflux disease)   . FECAL OCCULT BLOOD 10/19/2009  . COLONIC POLYPS, ADENOMATOUS, HX OF 10/19/2009  . OVERWEIGHT 11/08/2008  . DM 10/25/2008  . GERD 10/25/2008    ROS    Patient denies fever, chills, headache, sweats, rash, change in vision, change in hearing, chest pain, cough, nausea vomiting, urinary symptoms. All other  systems are reviewed and are negative.  PHYSICAL EXAM  Patient is oriented to person time and place. Affect is normal. There is no jugulovenous distention. Lungs are clear. Respiratory effort is nonlabored. Cardiac exam her vitals S1 and S2. There no clicks or significant murmurs. The abdomen is soft. There is no peripheral edema. He is overweight but he is doing better.  Filed Vitals:   10/23/12 0906  BP: 128/62  Pulse: 68  Height: 5\' 6"  (1.676 m)  Weight: 193 lb 12.8 oz (87.907 kg)     ASSESSMENT & PLAN

## 2012-11-19 ENCOUNTER — Other Ambulatory Visit: Payer: Self-pay

## 2013-01-25 ENCOUNTER — Telehealth: Payer: Self-pay | Admitting: Cardiology

## 2013-01-25 ENCOUNTER — Encounter: Payer: Self-pay | Admitting: Cardiology

## 2013-01-25 NOTE — Telephone Encounter (Signed)
New Prob    Pt states he is having a procedure done 1/23. Pt requesting to stop Plavix 5 days prior, and reduce Aspirin to 81 mg 5 days prior. Please call.

## 2013-01-26 NOTE — Telephone Encounter (Signed)
Follow UP  Pt returned called//SR

## 2013-01-26 NOTE — Telephone Encounter (Signed)
**Note De-identified Glenn Christo Obfuscation** Pt advised, he verbalized understanding. 

## 2013-01-26 NOTE — Telephone Encounter (Signed)
**Note De-Identified Aneli Zara Obfuscation** LMTCB.  Per DR [name] it is ok for the pt to decrease ASA to 81 mg daily and hold Plavix 5 days prior to surgery.

## 2013-01-26 NOTE — Telephone Encounter (Signed)
LMTCB

## 2013-03-08 ENCOUNTER — Telehealth: Payer: Self-pay | Admitting: Cardiology

## 2013-03-08 NOTE — Telephone Encounter (Signed)
The pt states that there was a message on his VM from this office but he does not know who called him. I advised the pt that I did not call him, he verbalized understanding.

## 2013-03-08 NOTE — Telephone Encounter (Signed)
New message ° ° ° ° ° °Returning Lynn's call °

## 2013-03-16 ENCOUNTER — Telehealth: Payer: Self-pay | Admitting: Cardiology

## 2013-03-16 NOTE — Telephone Encounter (Signed)
New Problem:  Pt states he is looking to have a knee replacement in or around May 2015... Pt wants to know if he needs to come in for a visit or if he can drop off a clearance form from [facility] for Dr. [name] to fill out.  Pt would like a call back from the nurse.

## 2013-03-16 NOTE — Telephone Encounter (Signed)
Routed to Dr. [name] and Jeani Hawking.

## 2013-03-16 NOTE — Telephone Encounter (Signed)
Please make him an elective appointment to see me in the office so that I can do a complete preop assessment.

## 2013-03-17 NOTE — Telephone Encounter (Signed)
Called patient to schedule Pre-surgery Clearance appointment for Friday April 10th at 10:30 am. Patient verbalized understanding and appreciation of appointment.

## 2013-04-23 ENCOUNTER — Encounter: Payer: Self-pay | Admitting: Cardiology

## 2013-04-23 ENCOUNTER — Ambulatory Visit (INDEPENDENT_AMBULATORY_CARE_PROVIDER_SITE_OTHER): Payer: Medicare Other | Admitting: Cardiology

## 2013-04-23 VITALS — BP 146/74 | HR 79 | Ht 65.0 in | Wt 222.0 lb

## 2013-04-23 DIAGNOSIS — I10 Essential (primary) hypertension: Secondary | ICD-10-CM

## 2013-04-23 DIAGNOSIS — I779 Disorder of arteries and arterioles, unspecified: Secondary | ICD-10-CM

## 2013-04-23 DIAGNOSIS — I2510 Atherosclerotic heart disease of native coronary artery without angina pectoris: Secondary | ICD-10-CM

## 2013-04-23 DIAGNOSIS — Z01810 Encounter for preprocedural cardiovascular examination: Secondary | ICD-10-CM

## 2013-04-23 DIAGNOSIS — E785 Hyperlipidemia, unspecified: Secondary | ICD-10-CM

## 2013-04-23 DIAGNOSIS — I739 Peripheral vascular disease, unspecified: Secondary | ICD-10-CM

## 2013-04-23 NOTE — Assessment & Plan Note (Signed)
Coronary disease is stable. Catheterization was done in September, 2011. He had moderate disease. He's been stable since then. No further workup.

## 2013-04-23 NOTE — Assessment & Plan Note (Signed)
The patient is cleared from the cardiac viewpoint to have his knee surgery. His coronary anatomy has been assessed within the past 5 years and is stable. He's not having any significant symptoms. There has been no recent MI. There have been no recent episodes of congestive heart failure or significant arrhythmias. Despite his knee his exercise level is greater than 4 METs and is not having significant symptoms. Therefore he can be cleared for his surgery.

## 2013-04-23 NOTE — Assessment & Plan Note (Signed)
It is time for her one-year followup carotid Doppler. We will have this done soon.

## 2013-04-23 NOTE — Patient Instructions (Signed)
**Note De-Identified Jaivion Kingsley Obfuscation** Your physician recommends that you continue on your current medications as directed. Please refer to the Current Medication list given to you today.  Your physician has requested that you have a carotid duplex. This test is an ultrasound of the carotid arteries in your neck. It looks at blood flow through these arteries that supply the brain with blood. Allow one hour for this exam. There are no restrictions or special instructions. To be done soon.  Your physician wants you to follow-up in: 1 year. You will receive a reminder letter in the mail two months in advance. If you don't receive a letter, please call our office to schedule the follow-up appointment.

## 2013-04-23 NOTE — Assessment & Plan Note (Signed)
The patient is only 20 mg of simvastatin. I have discussed the guidelines with him previously. He had severe symptoms from Lipitor in the past. He wants to stay on his current dose of medication.

## 2013-04-23 NOTE — Assessment & Plan Note (Signed)
Blood pressure is controlled. No change in therapy. 

## 2013-04-23 NOTE — Progress Notes (Signed)
Patient ID: [name] Ray, male   DOB: Jun 05, 1933, 80 y.o.   MRN: [id number]    HPI  Patient is seen today to followup coronary disease. He is also seen for cardiac clearance to have knee replacement. His last catheterization was done in 2011. His disease was stable. He has continued to be active. He needs knee surgery. Before February of this year he was able to ambulate quite well without symptoms. He continues to exercise using upper body exercises at the gym. He's not having any chest pain or shortness of breath. His original symptom with his MI was a severe sensation of indigestion. He is not having any of this.  Allergies  Allergen Reactions  . Atorvastatin   . Rosuvastatin     REACTION: unknown    Current Outpatient Prescriptions  Medication Sig Dispense Refill  . amLODipine (NORVASC) 10 MG tablet Take 10 mg by mouth daily.        Marland Kitchen aspirin 325 MG EC tablet Take 325 mg by mouth daily.        . clopidogrel (PLAVIX) 75 MG tablet Take 75 mg by mouth daily.        . Glucosamine-Chondroit-Vit C-Mn (GLUCOSAMINE 1500 COMPLEX PO) Take 1 tablet by mouth 2 (two) times daily.       Marland Kitchen KOMBIGLYZE XR 2.05-998 MG TB24 Take 1 tablet by mouth 2 (two) times daily.       Marland Kitchen Leuprolide Acetate (LUPRON IJ) Inject 45 mg as directed every 6 (six) months.      . loratadine (CLARITIN) 10 MG tablet Take 10 mg by mouth as needed.        . nitroGLYCERIN (NITROSTAT) 0.4 MG SL tablet Place 0.4 mg under the tongue every 5 (five) minutes as needed.        Marland Kitchen omeprazole (PRILOSEC) 20 MG capsule Take 20 mg by mouth daily. Take 30 minutes before meals      . ramipril (ALTACE) 10 MG capsule Take 10 mg by mouth daily.        . simvastatin (ZOCOR) 20 MG tablet Take 20 mg by mouth at bedtime.        . tamsulosin (FLOMAX) 0.4 MG CAPS capsule Take 1 capsule by mouth at bedtime.       No current facility-administered medications for this visit.    History   Social History  . Marital Status: Married    Spouse Name: N/A     Number of Children: N/A  . Years of Education: N/A   Occupational History  . Not on file.   Social History Main Topics  . Smoking status: Never Smoker   . Smokeless tobacco: Not on file  . Alcohol Use: No  . Drug Use: No  . Sexual Activity:    Other Topics Concern  . Not on file   Social History Narrative  . No narrative on file    Family History  Problem Relation Age of Onset  . Heart attack Paternal Grandfather     Past Medical History  Diagnosis Date  . GERD (gastroesophageal reflux disease)   . IBS (irritable bowel syndrome)   . Diabetes mellitus   . Hypertension   . Adenomatous polyp     history  . Diverticulosis   . Fatty liver     history  . Dyslipidemia   . Atrial bigeminy     in the past  . 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 Numbness and tingling in hands     resolved  . BPH (benign prostatic hyperplasia)   . Colon polyps   . Carotid artery disease     a. Doppler, January, 2012, 40-59% bilateral stenoses;  b.  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 Overweight     Past Surgical History  Procedure Laterality Date  . Cardiac catheterization      multiple stents in LAD    Patient Active Problem List   Diagnosis Date Noted  . Carotid artery disease     Priority: High  . Coronary artery disease     Priority: High  . Dyslipidemia     Priority: High  . Hypertension     Priority: High  . Numbness and tingling in hands     Priority: High  . GERD (gastroesophageal reflux disease)   . FECAL OCCULT BLOOD 10/19/2009  .  COLONIC POLYPS, ADENOMATOUS, HX OF 10/19/2009  . OVERWEIGHT 11/08/2008  . DM 10/25/2008  . GERD 10/25/2008    ROS   Patient denies fever, chills, headache, sweats, rash, change in vision, change in hearing, chest pain, cough, nausea vomiting, urinary symptoms. All other systems are reviewed and are negative.  PHYSICAL EXAM  Patient is stable. He is overweight. He is here with his wife. There is no jugulovenous distention. Lungs are clear. Respiratory effort is nonlabored. Cardiac exam reveals S1 and S2. There no clicks or significant murmurs. The abdomen is soft. Is no peripheral edema. There no musculoskeletal deformities. There are no skin rashes.  Filed Vitals:   04/23/13 1014  BP: 146/74  Pulse: 79  Height: 5\' 5"  (1.651 m)  Weight: 222 lb (100.699 kg)   EKG is done today and reviewed by me. EKG reveals no significant abnormalities. There are no significant changes from a prior EKG.  ASSESSMENT & PLAN

## 2013-04-27 ENCOUNTER — Other Ambulatory Visit: Payer: Self-pay | Admitting: Orthopedic Surgery

## 2013-04-30 ENCOUNTER — Encounter: Payer: Self-pay | Admitting: Cardiology

## 2013-04-30 ENCOUNTER — Ambulatory Visit (HOSPITAL_COMMUNITY): Payer: Medicare Other | Attending: Cardiology | Admitting: Cardiology

## 2013-04-30 DIAGNOSIS — I6529 Occlusion and stenosis of unspecified carotid artery: Secondary | ICD-10-CM

## 2013-04-30 DIAGNOSIS — I779 Disorder of arteries and arterioles, unspecified: Secondary | ICD-10-CM

## 2013-04-30 DIAGNOSIS — I739 Peripheral vascular disease, unspecified: Secondary | ICD-10-CM

## 2013-04-30 NOTE — Progress Notes (Signed)
Carotid duplex completed 

## 2013-05-02 ENCOUNTER — Encounter: Payer: Self-pay | Admitting: Cardiology

## 2013-05-10 ENCOUNTER — Telehealth: Payer: Self-pay | Admitting: Cardiology

## 2013-05-10 NOTE — Telephone Encounter (Signed)
New message ° ° ° ° ° °Returning a nurses call from last week °

## 2013-05-10 NOTE — Telephone Encounter (Signed)
**Note De-Identified [name] Ray Obfuscation** The pt is advised that his Carotid Duplex was stable, he verbalized understanding.

## 2013-05-11 ENCOUNTER — Encounter (HOSPITAL_COMMUNITY): Payer: Self-pay | Admitting: Pharmacy Technician

## 2013-05-13 NOTE — Pre-Procedure Instructions (Signed)
[name] Ray  05/13/2013   Your procedure is scheduled on:  Monday, May 11.  Report to [facility] Admitting at 6:45 AM.  Call this number if you have problems the morning of surgery: [phone]   Remember:   Do not eat food or drink liquids after midnight Sunday, May 10.    Take these medicines the morning of surgery with A SIP OF WATER: amLODipine (NORVASC), omeprazole(PRILOSEC).               Take if needed:nitroGLYCERIN (NITROSTAT), loratadine (CLARITIN).             Do not wear jewelry, make-up or nail polish.  Do not wear lotions, powders, or perfumes. You may wear deodorant.   Men may shave face and neck.  DDo not bring valuables to the hospital.  [facility] is not responsible  for any belongings or valuables.               Contacts, dentures or bridgework may not be worn into surgery.  Leave suitcase in the car. After surgery it may be brought to your room.  For patients admitted to the hospital, discharge time is determined by your treatment team.                 Special Instructions: -   Please read over the following fact sheets that you were given: Pain Booklet, Coughing and Deep Breathing, Blood Transfusion Information and Surgical Site Infection Prevention   

## 2013-05-13 NOTE — Pre-Procedure Instructions (Signed)
[name] Ray  05/13/2013   Your procedure is scheduled on:  Monday, May 11.  [facility] at 6:45 AM.  Call this number if you have problems the morning of surgery: [phone]   Remember:   Do not eat food or drink liquids after midnight Sunday, May 10.    Take these medicines the morning of surgery with A SIP OF WATER: amLODipine (NORVASC), omeprazole(PRILOSEC).               Take if needed:nitroGLYCERIN (NITROSTAT), loratadine (CLARITIN).             Do not wear jewelry  Do not wear lotions, powders, or perfumes. You may wear deodorant.   Men may shave face and neck.  DDo not bring valuables to the hospital.  [facility] is not responsible  for any belongings or valuables.               Contacts, dentures or bridgework may not be worn into surgery.  Leave suitcase in the car. After surgery it may be brought to your room.  For patients admitted to the hospital, discharge time is determined by your treatment team.                 Special Instructions: Special Instructions:  - Preparing for Surgery  Before surgery, you can play an important role.  Because skin is not sterile, your skin needs to be as free of germs as possible.  You can reduce the number of germs on you skin by washing with CHG (chlorahexidine gluconate) soap before surgery.  CHG is an antiseptic cleaner which kills germs and bonds with the skin to continue killing germs even after washing.  Please DO NOT use if you have an allergy to CHG or antibacterial soaps.  If your skin becomes reddened/irritated stop using the CHG and inform your nurse when you arrive at Short Stay.  Do not shave (including legs and underarms) for at least 48 hours prior to the first CHG shower.  You may shave your face.  Please follow these instructions carefully:   1.  Shower with CHG Soap the night before surgery and the  morning of Surgery.  2.  If you choose to wash your hair, wash your hair  first as usual with your  normal shampoo.  3.  After you shampoo, rinse your hair and body thoroughly to remove the  Shampoo.  4.  Use CHG as you would any other liquid soap.  You can apply chg directly to the skin and wash gently with scrungie or a clean washcloth.  5.  Apply the CHG Soap to your body ONLY FROM THE NECK DOWN.    Do not use on open wounds or open sores.  Avoid contact with your eyes, ears, mouth and genitals (private parts).  Wash genitals (private parts)   with your normal soap.  6.  Wash thoroughly, paying special attention to the area where your surgery will be performed.  7.  Thoroughly rinse your body with warm water from the neck down.  8.  DO NOT shower/wash with your normal soap after using and rinsing off   the CHG Soap.  9.  Pat yourself dry with a clean towel.            10.  Wear clean pajamas.            11 .  Place clean sheets on your bed the night of  your first shower and do not sleep with pets.  Day of Surgery  Do not apply any lotions/deodorants the morning of surgery.  Please wear clean clothes to the hospital/surgery center.   Please read over the following fact sheets that you were given: Pain Booklet, Coughing and Deep Breathing, Blood Transfusion Information and Surgical Site Infection Prevention

## 2013-05-13 NOTE — Pre-Procedure Instructions (Signed)
DAISEAN BRODHEAD  05/13/2013   Your procedure is scheduled on:  Monday, May 11.  Report to [facility] Admitting at 6:45 AM.  Call this number if you have problems the morning of surgery: [phone]   Remember:   Do not eat food or drink liquids after midnight Sunday, May 10.    Take these medicines the morning of surgery with A SIP OF WATER: amLODipine (NORVASC), omeprazole(PRILOSEC).               Take if needed:nitroGLYCERIN (NITROSTAT), loratadine (CLARITIN).             Do not wear jewelry, make-up or nail polish.  Do not wear lotions, powders, or perfumes. You may wear deodorant.   Men may shave face and neck.  DDo not bring valuables to the hospital.  [facility] is not responsible  for any belongings or valuables.               Contacts, dentures or bridgework may not be worn into surgery.  Leave suitcase in the car. After surgery it may be brought to your room.  For patients admitted to the hospital, discharge time is determined by your treatment team.                 Special Instructions: -   Please read over the following fact sheets that you were given: Pain Booklet, Coughing and Deep Breathing, Blood Transfusion Information and Surgical Site Infection Prevention

## 2013-05-14 ENCOUNTER — Ambulatory Visit (HOSPITAL_COMMUNITY)
Admission: RE | Admit: 2013-05-14 | Discharge: 2013-05-14 | Disposition: A | Discharge status: Home / Self Care | Payer: Medicare Other | Source: Ambulatory Visit | Attending: Orthopedic Surgery | Admitting: Orthopedic Surgery

## 2013-05-14 ENCOUNTER — Encounter (HOSPITAL_COMMUNITY): Payer: Self-pay

## 2013-05-14 ENCOUNTER — Encounter (HOSPITAL_COMMUNITY)
Admission: RE | Admit: 2013-05-14 | Discharge: 2013-05-14 | Disposition: A | Discharge status: Home / Self Care | Payer: Medicare Other | Source: Ambulatory Visit | Attending: Orthopedic Surgery | Admitting: Orthopedic Surgery

## 2013-05-14 DIAGNOSIS — Z01812 Encounter for preprocedural laboratory examination: Secondary | ICD-10-CM | POA: Insufficient documentation

## 2013-05-14 DIAGNOSIS — Z01818 Encounter for other preprocedural examination: Secondary | ICD-10-CM | POA: Insufficient documentation

## 2013-05-14 HISTORY — DX: Unspecified osteoarthritis, unspecified site: M19.90

## 2013-05-14 HISTORY — DX: Acute myocardial infarction, unspecified: I21.9

## 2013-05-14 LAB — TYPE AND SCREEN
ABO/RH(D): A POS
Antibody Screen: NEGATIVE

## 2013-05-14 LAB — COMPREHENSIVE METABOLIC PANEL
ALT: 10 U/L (ref 0–53)
AST: 13 U/L (ref 0–37)
Albumin: 3.6 g/dL (ref 3.5–5.2)
Alkaline Phosphatase: 60 U/L (ref 39–117)
BUN: 23 mg/dL (ref 6–23)
CO2: 25 mEq/L (ref 19–32)
Calcium: 9.2 mg/dL (ref 8.4–10.5)
Chloride: 105 mEq/L (ref 96–112)
Creatinine, Ser: 0.99 mg/dL (ref 0.50–1.35)
GFR calc Af Amer: 89 mL/min — ABNORMAL LOW (ref >90)
GFR calc non Af Amer: 77 mL/min — ABNORMAL LOW (ref >90)
Glucose, Bld: 119 mg/dL — ABNORMAL HIGH (ref 70–99)
Potassium: 4.3 mEq/L (ref 3.7–5.3)
Sodium: 142 mEq/L (ref 137–147)
Total Bilirubin: 0.2 mg/dL — ABNORMAL LOW (ref 0.3–1.2)
Total Protein: 6.9 g/dL (ref 6.0–8.3)

## 2013-05-14 LAB — URINALYSIS, ROUTINE W REFLEX MICROSCOPIC
Bilirubin Urine: NEGATIVE
Glucose, UA: NEGATIVE mg/dL
Hgb urine dipstick: NEGATIVE
Ketones, ur: NEGATIVE mg/dL
Nitrite: NEGATIVE
Protein, ur: NEGATIVE mg/dL
Specific Gravity, Urine: 1.029 (ref 1.005–1.030)
Urobilinogen, UA: 1 mg/dL (ref 0.0–1.0)
pH: 5 (ref 5.0–8.0)

## 2013-05-14 LAB — CBC WITH DIFFERENTIAL/PLATELET
Basophils Absolute: 0 10*3/uL (ref 0.0–0.1)
Basophils Relative: 0 % (ref 0–1)
Eosinophils Absolute: 0.4 10*3/uL (ref 0.0–0.7)
Eosinophils Relative: 5 % (ref 0–5)
HCT: 37.7 % — ABNORMAL LOW (ref 39.0–52.0)
Hemoglobin: 12.4 g/dL — ABNORMAL LOW (ref 13.0–17.0)
Lymphocytes Relative: 15 % (ref 12–46)
Lymphs Abs: 1.1 10*3/uL (ref 0.7–4.0)
MCH: 29.8 pg (ref 26.0–34.0)
MCHC: 32.9 g/dL (ref 30.0–36.0)
MCV: 90.6 fL (ref 78.0–100.0)
Monocytes Absolute: 0.7 10*3/uL (ref 0.1–1.0)
Monocytes Relative: 9 % (ref 3–12)
Neutro Abs: 5.1 10*3/uL (ref 1.7–7.7)
Neutrophils Relative %: 71 % (ref 43–77)
Platelets: 182 10*3/uL (ref 150–400)
RBC: 4.16 MIL/uL — ABNORMAL LOW (ref 4.22–5.81)
RDW: 13.7 % (ref 11.5–15.5)
WBC: 7.3 10*3/uL (ref 4.0–10.5)

## 2013-05-14 LAB — URINE MICROSCOPIC-ADD ON

## 2013-05-14 LAB — SURGICAL PCR SCREEN
MRSA, PCR: NEGATIVE
Staphylococcus aureus: NEGATIVE

## 2013-05-14 LAB — PROTIME-INR
INR: 0.94 (ref 0.00–1.49)
Prothrombin Time: 12.4 seconds (ref 11.6–15.2)

## 2013-05-14 LAB — ABO/RH: ABO/RH(D): A POS

## 2013-05-14 LAB — APTT: aPTT: 24 seconds (ref 24–37)

## 2013-05-14 NOTE — Progress Notes (Signed)
Pt. Reports that he is in stopping Plavix & Asa starting 05/17/2013, approval of Dr. [name]

## 2013-05-14 NOTE — Progress Notes (Signed)
05/14/13 1035  OBSTRUCTIVE SLEEP APNEA  Have you ever been diagnosed with sleep apnea through a sleep study? No  Do you snore loudly (loud enough to be heard through closed doors)?  0  Do you often feel tired, fatigued, or sleepy during the daytime? 0  Has anyone observed you stop breathing during your sleep? 0  Do you have, or are you being treated for high blood pressure? 1  BMI more than 35 kg/m2? 1  Age over 78 years old? 1  Neck circumference greater than 40 cm/16 inches? 1  Gender: 1  Obstructive Sleep Apnea Score 5  Score 4 or greater  Results sent to PCP

## 2013-05-17 LAB — URINE CULTURE: Colony Count: 70000

## 2013-05-23 MED ORDER — CHLORHEXIDINE GLUCONATE 4 % EX LIQD
60.0000 mL | Freq: Once | CUTANEOUS | Status: DC
  Filled 2013-05-23: qty 60

## 2013-05-23 MED ORDER — TRANEXAMIC ACID 100 MG/ML IV SOLN
1000.0000 mg | INTRAVENOUS | Status: AC
  Administered 2013-05-24: 1000 mg via INTRAVENOUS
  Filled 2013-05-23: qty 10

## 2013-05-23 MED ORDER — CEFAZOLIN SODIUM-DEXTROSE 2-3 GM-% IV SOLR
2.0000 g | INTRAVENOUS | Status: AC
Start: 2013-05-24 — End: 2013-05-24
  Administered 2013-05-24: 2 g via INTRAVENOUS
  Filled 2013-05-23: qty 50

## 2013-05-24 ENCOUNTER — Inpatient Hospital Stay (HOSPITAL_COMMUNITY): Payer: Medicare Other | Admitting: Certified Registered Nurse Anesthetist

## 2013-05-24 ENCOUNTER — Encounter (HOSPITAL_COMMUNITY)
Admission: RE | Disposition: A | Discharge status: Home Health | Payer: Self-pay | Source: Ambulatory Visit | Attending: Orthopedic Surgery

## 2013-05-24 ENCOUNTER — Inpatient Hospital Stay (HOSPITAL_COMMUNITY)
Admission: RE | Admit: 2013-05-24 | Discharge: 2013-05-25 | DRG: 470 | Disposition: A | Discharge status: Home Health | Payer: Medicare Other | Source: Ambulatory Visit | Attending: Orthopedic Surgery | Admitting: Orthopedic Surgery

## 2013-05-24 ENCOUNTER — Encounter (HOSPITAL_COMMUNITY): Payer: Medicare Other | Admitting: Certified Registered Nurse Anesthetist

## 2013-05-24 ENCOUNTER — Encounter (HOSPITAL_COMMUNITY): Payer: Self-pay | Admitting: Anesthesiology

## 2013-05-24 DIAGNOSIS — Z96659 Presence of unspecified artificial knee joint: Secondary | ICD-10-CM

## 2013-05-24 DIAGNOSIS — Z7982 Long term (current) use of aspirin: Secondary | ICD-10-CM

## 2013-05-24 DIAGNOSIS — I252 Old myocardial infarction: Secondary | ICD-10-CM

## 2013-05-24 DIAGNOSIS — Z9861 Coronary angioplasty status: Secondary | ICD-10-CM

## 2013-05-24 DIAGNOSIS — E785 Hyperlipidemia, unspecified: Secondary | ICD-10-CM | POA: Diagnosis present

## 2013-05-24 DIAGNOSIS — K589 Irritable bowel syndrome without diarrhea: Secondary | ICD-10-CM | POA: Diagnosis present

## 2013-05-24 DIAGNOSIS — K219 Gastro-esophageal reflux disease without esophagitis: Secondary | ICD-10-CM | POA: Diagnosis present

## 2013-05-24 DIAGNOSIS — Z79899 Other long term (current) drug therapy: Secondary | ICD-10-CM

## 2013-05-24 DIAGNOSIS — D62 Acute posthemorrhagic anemia: Secondary | ICD-10-CM | POA: Diagnosis not present

## 2013-05-24 DIAGNOSIS — E119 Type 2 diabetes mellitus without complications: Secondary | ICD-10-CM | POA: Diagnosis present

## 2013-05-24 DIAGNOSIS — I10 Essential (primary) hypertension: Secondary | ICD-10-CM | POA: Diagnosis present

## 2013-05-24 DIAGNOSIS — I779 Disorder of arteries and arterioles, unspecified: Secondary | ICD-10-CM | POA: Diagnosis present

## 2013-05-24 DIAGNOSIS — N400 Enlarged prostate without lower urinary tract symptoms: Secondary | ICD-10-CM | POA: Diagnosis present

## 2013-05-24 DIAGNOSIS — M1710 Unilateral primary osteoarthritis, unspecified knee: Principal | ICD-10-CM | POA: Diagnosis present

## 2013-05-24 HISTORY — DX: Malignant neoplasm of prostate: C61

## 2013-05-24 HISTORY — DX: Type 2 diabetes mellitus without complications: E11.9

## 2013-05-24 HISTORY — PX: TOTAL KNEE ARTHROPLASTY: SHX125

## 2013-05-24 LAB — GLUCOSE, CAPILLARY
Glucose-Capillary: 114 mg/dL — ABNORMAL HIGH (ref 70–99)
Glucose-Capillary: 158 mg/dL — ABNORMAL HIGH (ref 70–99)
Glucose-Capillary: 173 mg/dL — ABNORMAL HIGH (ref 70–99)

## 2013-05-24 SURGERY — ARTHROPLASTY, KNEE, TOTAL
Anesthesia: General | Site: Knee | Laterality: Right

## 2013-05-24 MED ORDER — LIDOCAINE HCL (CARDIAC) 20 MG/ML IV SOLN
INTRAVENOUS | Status: DC | PRN
  Administered 2013-05-24: 40 mg via INTRAVENOUS

## 2013-05-24 MED ORDER — LACTATED RINGERS IV SOLN
INTRAVENOUS | Status: DC
  Administered 2013-05-24: 07:00:00 via INTRAVENOUS

## 2013-05-24 MED ORDER — ROPIVACAINE HCL 5 MG/ML IJ SOLN
INTRAMUSCULAR | Status: DC | PRN
  Administered 2013-05-24: 25 mL via PERINEURAL

## 2013-05-24 MED ORDER — HYDROMORPHONE HCL PF 1 MG/ML IJ SOLN
1.0000 mg | INTRAMUSCULAR | Status: DC | PRN
Start: 2013-05-24 — End: 2013-05-25
  Administered 2013-05-24: 1 mg via INTRAVENOUS
  Filled 2013-05-24: qty 1

## 2013-05-24 MED ORDER — ACETAMINOPHEN 325 MG PO TABS: 650.0000 mg | ORAL_TABLET | Freq: Four times a day (QID) | ORAL | Status: DC | PRN

## 2013-05-24 MED ORDER — LINAGLIPTIN 5 MG PO TABS
5.0000 mg | ORAL_TABLET | Freq: Every day | ORAL | Status: DC
  Filled 2013-05-24: qty 1

## 2013-05-24 MED ORDER — FLEET ENEMA 7-19 GM/118ML RE ENEM: 1.0000 | ENEMA | Freq: Once | RECTAL | Status: AC | PRN

## 2013-05-24 MED ORDER — BUPIVACAINE LIPOSOME 1.3 % IJ SUSP
20.0000 mL | Freq: Once | INTRAMUSCULAR | Status: DC
  Filled 2013-05-24: qty 20

## 2013-05-24 MED ORDER — SIMVASTATIN 20 MG PO TABS
20.0000 mg | ORAL_TABLET | Freq: Every day | ORAL | Status: DC
  Administered 2013-05-24: 20 mg via ORAL
  Filled 2013-05-24 (×2): qty 1

## 2013-05-24 MED ORDER — BUPIVACAINE LIPOSOME 1.3 % IJ SUSP
INTRAMUSCULAR | Status: DC | PRN
  Administered 2013-05-24: 20 mL

## 2013-05-24 MED ORDER — MIDAZOLAM HCL 2 MG/2ML IJ SOLN
1.0000 mg | INTRAMUSCULAR | Status: DC | PRN
  Administered 2013-05-24: 2 mg via INTRAVENOUS

## 2013-05-24 MED ORDER — NITROGLYCERIN 0.4 MG SL SUBL: 0.4000 mg | SUBLINGUAL_TABLET | SUBLINGUAL | Status: DC | PRN

## 2013-05-24 MED ORDER — SODIUM CHLORIDE 0.9 % IR SOLN
Status: DC | PRN
  Administered 2013-05-24 (×2): 1000 mL

## 2013-05-24 MED ORDER — METHOCARBAMOL 500 MG PO TABS
ORAL_TABLET | ORAL | Status: AC
  Filled 2013-05-24: qty 1

## 2013-05-24 MED ORDER — METHOCARBAMOL 500 MG PO TABS
500.0000 mg | ORAL_TABLET | Freq: Four times a day (QID) | ORAL | Status: DC | PRN
Start: 2013-05-24 — End: 2013-05-25
  Administered 2013-05-24 – 2013-05-25 (×3): 500 mg via ORAL
  Filled 2013-05-24 (×3): qty 1

## 2013-05-24 MED ORDER — ONDANSETRON HCL 4 MG/2ML IJ SOLN
INTRAMUSCULAR | Status: AC
  Filled 2013-05-24: qty 2

## 2013-05-24 MED ORDER — TAMSULOSIN HCL 0.4 MG PO CAPS
0.4000 mg | ORAL_CAPSULE | Freq: Every day | ORAL | Status: DC
  Administered 2013-05-24: 0.4 mg via ORAL
  Filled 2013-05-24 (×2): qty 1

## 2013-05-24 MED ORDER — OXYCODONE HCL 5 MG PO TABS
5.0000 mg | ORAL_TABLET | ORAL | Status: DC | PRN
Start: 2013-05-24 — End: 2013-05-25
  Administered 2013-05-24 – 2013-05-25 (×2): 5 mg via ORAL
  Administered 2013-05-25: 10 mg via ORAL
  Filled 2013-05-24 (×2): qty 2
  Filled 2013-05-24: qty 1

## 2013-05-24 MED ORDER — BISACODYL 5 MG PO TBEC: 5.0000 mg | DELAYED_RELEASE_TABLET | Freq: Every day | ORAL | Status: DC | PRN

## 2013-05-24 MED ORDER — SAXAGLIPTIN-METFORMIN ER 5-1000 MG PO TB24: 1.0000 | ORAL_TABLET | Freq: Two times a day (BID) | ORAL | Status: DC

## 2013-05-24 MED ORDER — EPHEDRINE SULFATE 50 MG/ML IJ SOLN
INTRAMUSCULAR | Status: AC
  Filled 2013-05-24: qty 1

## 2013-05-24 MED ORDER — SUCCINYLCHOLINE CHLORIDE 20 MG/ML IJ SOLN
INTRAMUSCULAR | Status: DC | PRN
  Administered 2013-05-24: 120 mg via INTRAVENOUS

## 2013-05-24 MED ORDER — METFORMIN HCL ER 500 MG PO TB24
1000.0000 mg | ORAL_TABLET | Freq: Two times a day (BID) | ORAL | Status: DC
  Administered 2013-05-24 – 2013-05-25 (×2): 1000 mg via ORAL
  Filled 2013-05-24 (×4): qty 2

## 2013-05-24 MED ORDER — DIPHENHYDRAMINE HCL 12.5 MG/5ML PO ELIX: 12.5000 mg | ORAL_SOLUTION | ORAL | Status: DC | PRN

## 2013-05-24 MED ORDER — SODIUM CHLORIDE 0.9 % IV SOLN
INTRAVENOUS | Status: DC
  Administered 2013-05-24: 16:00:00 via INTRAVENOUS

## 2013-05-24 MED ORDER — ALUM & MAG HYDROXIDE-SIMETH 200-200-20 MG/5ML PO SUSP: 30.0000 mL | ORAL | Status: DC | PRN

## 2013-05-24 MED ORDER — OXYCODONE HCL ER 10 MG PO T12A
10.0000 mg | EXTENDED_RELEASE_TABLET | Freq: Two times a day (BID) | ORAL | Status: DC
  Administered 2013-05-24 – 2013-05-25 (×3): 10 mg via ORAL
  Filled 2013-05-24 (×3): qty 1

## 2013-05-24 MED ORDER — BUPIVACAINE ON-Q PAIN PUMP (FOR ORDER SET NO CHG)
INJECTION | Status: DC
  Filled 2013-05-24: qty 1

## 2013-05-24 MED ORDER — ACETAMINOPHEN 650 MG RE SUPP: 650.0000 mg | Freq: Four times a day (QID) | RECTAL | Status: DC | PRN

## 2013-05-24 MED ORDER — MIDAZOLAM HCL 5 MG/ML IJ SOLN: 2.0000 mg | Freq: Once | INTRAMUSCULAR | Status: DC

## 2013-05-24 MED ORDER — METOCLOPRAMIDE HCL 5 MG/ML IJ SOLN
INTRAMUSCULAR | Status: DC | PRN
  Administered 2013-05-24: 10 mg via INTRAVENOUS

## 2013-05-24 MED ORDER — MIDAZOLAM HCL 2 MG/2ML IJ SOLN
INTRAMUSCULAR | Status: AC
  Filled 2013-05-24: qty 2

## 2013-05-24 MED ORDER — METHOCARBAMOL 1000 MG/10ML IJ SOLN
500.0000 mg | Freq: Four times a day (QID) | INTRAMUSCULAR | Status: DC | PRN
Start: 2013-05-24 — End: 2013-05-25
  Filled 2013-05-24: qty 5

## 2013-05-24 MED ORDER — PHENOL 1.4 % MT LIQD: 1.0000 | OROMUCOSAL | Status: DC | PRN

## 2013-05-24 MED ORDER — CLOPIDOGREL BISULFATE 75 MG PO TABS
75.0000 mg | ORAL_TABLET | Freq: Every day | ORAL | Status: DC
  Administered 2013-05-24 – 2013-05-25 (×2): 75 mg via ORAL
  Filled 2013-05-24 (×2): qty 1

## 2013-05-24 MED ORDER — LIDOCAINE HCL 1 % IJ SOLN
INTRAMUSCULAR | Status: DC | PRN
  Administered 2013-05-24: 1 mL via INTRADERMAL

## 2013-05-24 MED ORDER — ONDANSETRON HCL 4 MG/2ML IJ SOLN
INTRAMUSCULAR | Status: DC | PRN
  Administered 2013-05-24: 4 mg via INTRAVENOUS

## 2013-05-24 MED ORDER — OXYCODONE HCL 5 MG PO TABS
ORAL_TABLET | ORAL | Status: AC
  Filled 2013-05-24: qty 1

## 2013-05-24 MED ORDER — FENTANYL CITRATE 0.05 MG/ML IJ SOLN
50.0000 ug | INTRAMUSCULAR | Status: DC | PRN
  Administered 2013-05-24: 50 ug via INTRAVENOUS

## 2013-05-24 MED ORDER — FENTANYL CITRATE 0.05 MG/ML IJ SOLN
INTRAMUSCULAR | Status: AC
  Filled 2013-05-24: qty 5

## 2013-05-24 MED ORDER — DEXAMETHASONE SODIUM PHOSPHATE 4 MG/ML IJ SOLN
INTRAMUSCULAR | Status: AC
  Filled 2013-05-24: qty 1

## 2013-05-24 MED ORDER — RAMIPRIL 10 MG PO CAPS
10.0000 mg | ORAL_CAPSULE | Freq: Every day | ORAL | Status: DC
  Administered 2013-05-25: 10 mg via ORAL
  Filled 2013-05-24 (×2): qty 1

## 2013-05-24 MED ORDER — CEFAZOLIN SODIUM-DEXTROSE 2-3 GM-% IV SOLR
2.0000 g | Freq: Four times a day (QID) | INTRAVENOUS | Status: AC
  Administered 2013-05-24 (×2): 2 g via INTRAVENOUS
  Filled 2013-05-24 (×2): qty 50

## 2013-05-24 MED ORDER — METOCLOPRAMIDE HCL 5 MG/ML IJ SOLN
INTRAMUSCULAR | Status: AC
Start: 2013-05-24 — End: 2013-05-24
  Filled 2013-05-24: qty 2

## 2013-05-24 MED ORDER — PANTOPRAZOLE SODIUM 40 MG PO TBEC
40.0000 mg | DELAYED_RELEASE_TABLET | Freq: Every day | ORAL | Status: DC
  Administered 2013-05-25 (×2): 40 mg via ORAL
  Filled 2013-05-24 (×2): qty 1

## 2013-05-24 MED ORDER — MENTHOL 3 MG MT LOZG: 1.0000 | LOZENGE | OROMUCOSAL | Status: DC | PRN

## 2013-05-24 MED ORDER — AMLODIPINE BESYLATE 10 MG PO TABS
10.0000 mg | ORAL_TABLET | Freq: Every evening | ORAL | Status: DC
  Administered 2013-05-24: 10 mg via ORAL
  Filled 2013-05-24 (×2): qty 1

## 2013-05-24 MED ORDER — METOCLOPRAMIDE HCL 5 MG/ML IJ SOLN: 5.0000 mg | Freq: Three times a day (TID) | INTRAMUSCULAR | Status: DC | PRN

## 2013-05-24 MED ORDER — SENNOSIDES-DOCUSATE SODIUM 8.6-50 MG PO TABS: 1.0000 | ORAL_TABLET | Freq: Every evening | ORAL | Status: DC | PRN

## 2013-05-24 MED ORDER — METOCLOPRAMIDE HCL 5 MG PO TABS
5.0000 mg | ORAL_TABLET | Freq: Three times a day (TID) | ORAL | Status: DC | PRN
  Filled 2013-05-24: qty 2

## 2013-05-24 MED ORDER — DOCUSATE SODIUM 100 MG PO CAPS
100.0000 mg | ORAL_CAPSULE | Freq: Two times a day (BID) | ORAL | Status: DC
  Administered 2013-05-25 (×2): 100 mg via ORAL
  Filled 2013-05-24 (×4): qty 1

## 2013-05-24 MED ORDER — ASPIRIN EC 325 MG PO TBEC
325.0000 mg | DELAYED_RELEASE_TABLET | Freq: Every day | ORAL | Status: DC
  Administered 2013-05-24 – 2013-05-25 (×2): 325 mg via ORAL
  Filled 2013-05-24 (×2): qty 1

## 2013-05-24 MED ORDER — HYDROMORPHONE HCL PF 1 MG/ML IJ SOLN
INTRAMUSCULAR | Status: AC
  Filled 2013-05-24: qty 1

## 2013-05-24 MED ORDER — BUPIVACAINE-EPINEPHRINE (PF) 0.5% -1:200000 IJ SOLN
INTRAMUSCULAR | Status: AC
  Filled 2013-05-24: qty 30

## 2013-05-24 MED ORDER — HYDROMORPHONE HCL PF 1 MG/ML IJ SOLN
0.2500 mg | INTRAMUSCULAR | Status: DC | PRN
  Administered 2013-05-24: 0.5 mg via INTRAVENOUS

## 2013-05-24 MED ORDER — METOCLOPRAMIDE HCL 5 MG/ML IJ SOLN: 10.0000 mg | Freq: Once | INTRAMUSCULAR | Status: DC | PRN

## 2013-05-24 MED ORDER — ROCURONIUM BROMIDE 50 MG/5ML IV SOLN
INTRAVENOUS | Status: AC
  Filled 2013-05-24: qty 1

## 2013-05-24 MED ORDER — 0.9 % SODIUM CHLORIDE (POUR BTL) OPTIME
TOPICAL | Status: DC | PRN
  Administered 2013-05-24: 1000 mL

## 2013-05-24 MED ORDER — OXYCODONE HCL 5 MG PO TABS
5.0000 mg | ORAL_TABLET | Freq: Once | ORAL | Status: AC | PRN
  Administered 2013-05-24: 5 mg via ORAL

## 2013-05-24 MED ORDER — PROPOFOL 10 MG/ML IV BOLUS
INTRAVENOUS | Status: DC | PRN
  Administered 2013-05-24: 160 mg via INTRAVENOUS

## 2013-05-24 MED ORDER — FENTANYL CITRATE 0.05 MG/ML IJ SOLN
INTRAMUSCULAR | Status: DC | PRN
  Administered 2013-05-24 (×2): 50 ug via INTRAVENOUS
  Administered 2013-05-24: 25 ug via INTRAVENOUS
  Administered 2013-05-24 (×2): 50 ug via INTRAVENOUS
  Administered 2013-05-24: 25 ug via INTRAVENOUS

## 2013-05-24 MED ORDER — LORATADINE 10 MG PO TABS: 10.0000 mg | ORAL_TABLET | Freq: Every day | ORAL | Status: DC | PRN

## 2013-05-24 MED ORDER — LIDOCAINE HCL (CARDIAC) 20 MG/ML IV SOLN
INTRAVENOUS | Status: AC
  Filled 2013-05-24: qty 5

## 2013-05-24 MED ORDER — LACTATED RINGERS IV SOLN
INTRAVENOUS | Status: DC | PRN
  Administered 2013-05-24 (×2): via INTRAVENOUS

## 2013-05-24 MED ORDER — ONDANSETRON HCL 4 MG/2ML IJ SOLN: 4.0000 mg | Freq: Four times a day (QID) | INTRAMUSCULAR | Status: DC | PRN

## 2013-05-24 MED ORDER — SODIUM CHLORIDE 0.9 % IV SOLN
INTRAVENOUS | Status: DC
Start: 2013-05-24 — End: 2013-05-24

## 2013-05-24 MED ORDER — ONDANSETRON HCL 4 MG PO TABS: 4.0000 mg | ORAL_TABLET | Freq: Four times a day (QID) | ORAL | Status: DC | PRN

## 2013-05-24 MED ORDER — SUCCINYLCHOLINE CHLORIDE 20 MG/ML IJ SOLN
INTRAMUSCULAR | Status: AC
  Filled 2013-05-24: qty 1

## 2013-05-24 MED ORDER — CELECOXIB 200 MG PO CAPS
200.0000 mg | ORAL_CAPSULE | Freq: Two times a day (BID) | ORAL | Status: DC
  Administered 2013-05-24 – 2013-05-25 (×3): 200 mg via ORAL
  Filled 2013-05-24 (×4): qty 1

## 2013-05-24 MED ORDER — DEXAMETHASONE SODIUM PHOSPHATE 4 MG/ML IJ SOLN
INTRAMUSCULAR | Status: DC | PRN
  Administered 2013-05-24: 4 mg via INTRAVENOUS

## 2013-05-24 MED ORDER — PROPOFOL 10 MG/ML IV BOLUS
INTRAVENOUS | Status: AC
  Filled 2013-05-24: qty 20

## 2013-05-24 MED ORDER — FENTANYL CITRATE 0.05 MG/ML IJ SOLN
INTRAMUSCULAR | Status: AC
  Administered 2013-05-24: 50 ug via INTRAVENOUS
  Filled 2013-05-24: qty 2

## 2013-05-24 MED ORDER — EPHEDRINE SULFATE 50 MG/ML IJ SOLN
INTRAMUSCULAR | Status: DC | PRN
  Administered 2013-05-24 (×2): 10 mg via INTRAVENOUS

## 2013-05-24 MED ORDER — OXYCODONE HCL 5 MG/5ML PO SOLN: 5.0000 mg | Freq: Once | ORAL | Status: AC | PRN

## 2013-05-24 MED ORDER — SODIUM CHLORIDE 0.9 % IJ SOLN
INTRAMUSCULAR | Status: AC
  Filled 2013-05-24: qty 10

## 2013-05-24 MED ORDER — BUPIVACAINE-EPINEPHRINE 0.5% -1:200000 IJ SOLN
INTRAMUSCULAR | Status: DC | PRN
  Administered 2013-05-24: 30 mL

## 2013-05-24 SURGICAL SUPPLY — 63 items
BANDAGE ELASTIC 4 VELCRO ST LF (GAUZE/BANDAGES/DRESSINGS) ×2 IMPLANT
BANDAGE ELASTIC 6 VELCRO ST LF (GAUZE/BANDAGES/DRESSINGS) ×2 IMPLANT
BANDAGE ESMARK 6X9 LF (GAUZE/BANDAGES/DRESSINGS) ×1 IMPLANT
BLADE 10 SAFETY STRL DISP (BLADE) IMPLANT
BLADE SAGITTAL 13X1.27X60 (BLADE) ×2 IMPLANT
BLADE SAW SGTL 83.5X18.5 (BLADE) ×2 IMPLANT
BNDG ESMARK 6X9 LF (GAUZE/BANDAGES/DRESSINGS) ×2
BOWL SMART MIX CTS (DISPOSABLE) ×2 IMPLANT
CAP POR TM CP VIT E LN CER HD ×2 IMPLANT
CEMENT BONE SIMPLEX SPEEDSET (Cement) ×4 IMPLANT
COVER SURGICAL LIGHT HANDLE (MISCELLANEOUS) ×2 IMPLANT
CUFF TOURNIQUET SINGLE 34IN LL (TOURNIQUET CUFF) ×2 IMPLANT
DRAPE EXTREMITY T 121X128X90 (DRAPE) ×2 IMPLANT
DRAPE INCISE IOBAN 66X45 STRL (DRAPES) ×4 IMPLANT
DRAPE PROXIMA HALF (DRAPES) ×2 IMPLANT
DRAPE U-SHAPE 47X51 STRL (DRAPES) ×2 IMPLANT
DRSG ADAPTIC 3X8 NADH LF (GAUZE/BANDAGES/DRESSINGS) ×2 IMPLANT
DRSG PAD ABDOMINAL 8X10 ST (GAUZE/BANDAGES/DRESSINGS) ×2 IMPLANT
DURAPREP 26ML APPLICATOR (WOUND CARE) ×4 IMPLANT
ELECT REM PT RETURN 9FT ADLT (ELECTROSURGICAL) ×2
ELECTRODE REM PT RTRN 9FT ADLT (ELECTROSURGICAL) ×1 IMPLANT
EVACUATOR 1/8 PVC DRAIN (DRAIN) ×2 IMPLANT
GLOVE BIOGEL M 7.0 STRL (GLOVE) ×4 IMPLANT
GLOVE BIOGEL PI IND STRL 7.0 (GLOVE) ×1 IMPLANT
GLOVE BIOGEL PI IND STRL 7.5 (GLOVE) IMPLANT
GLOVE BIOGEL PI IND STRL 8.5 (GLOVE) ×2 IMPLANT
GLOVE BIOGEL PI INDICATOR 7.0 (GLOVE) ×1
GLOVE BIOGEL PI INDICATOR 7.5 (GLOVE)
GLOVE BIOGEL PI INDICATOR 8.5 (GLOVE) ×2
GLOVE ECLIPSE 6.5 STRL STRAW (GLOVE) ×2 IMPLANT
GLOVE SURG ORTHO 8.0 STRL STRW (GLOVE) ×6 IMPLANT
GOWN STRL REUS W/ TWL LRG LVL3 (GOWN DISPOSABLE) ×2 IMPLANT
GOWN STRL REUS W/ TWL XL LVL3 (GOWN DISPOSABLE) ×2 IMPLANT
GOWN STRL REUS W/TWL LRG LVL3 (GOWN DISPOSABLE) ×2
GOWN STRL REUS W/TWL XL LVL3 (GOWN DISPOSABLE) ×2
HANDPIECE INTERPULSE COAX TIP (DISPOSABLE) ×1
HOOD PEEL AWAY FACE SHEILD DIS (HOOD) ×6 IMPLANT
KIT BASIN OR (CUSTOM PROCEDURE TRAY) ×2 IMPLANT
KIT ROOM TURNOVER OR (KITS) ×2 IMPLANT
MANIFOLD NEPTUNE II (INSTRUMENTS) ×2 IMPLANT
NEEDLE 22X1 1/2 (OR ONLY) (NEEDLE) ×4 IMPLANT
NS IRRIG 1000ML POUR BTL (IV SOLUTION) ×2 IMPLANT
PACK TOTAL JOINT (CUSTOM PROCEDURE TRAY) ×2 IMPLANT
PAD ARMBOARD 7.5X6 YLW CONV (MISCELLANEOUS) ×4 IMPLANT
PAD CAST 4YDX4 CTTN HI CHSV (CAST SUPPLIES) ×1 IMPLANT
PADDING CAST COTTON 4X4 STRL (CAST SUPPLIES) ×1
PADDING CAST COTTON 6X4 STRL (CAST SUPPLIES) ×2 IMPLANT
SET HNDPC FAN SPRY TIP SCT (DISPOSABLE) ×1 IMPLANT
SPONGE GAUZE 4X4 12PLY (GAUZE/BANDAGES/DRESSINGS) ×2 IMPLANT
SPONGE GAUZE 4X4 12PLY STER LF (GAUZE/BANDAGES/DRESSINGS) ×2 IMPLANT
STAPLER VISISTAT 35W (STAPLE) ×2 IMPLANT
SUCTION FRAZIER TIP 10 FR DISP (SUCTIONS) ×2 IMPLANT
SUT BONE WAX W31G (SUTURE) ×2 IMPLANT
SUT VIC AB 0 CTB1 27 (SUTURE) ×4 IMPLANT
SUT VIC AB 1 CT1 27 (SUTURE) ×3
SUT VIC AB 1 CT1 27XBRD ANBCTR (SUTURE) ×3 IMPLANT
SUT VIC AB 2-0 CT1 27 (SUTURE) ×2
SUT VIC AB 2-0 CT1 TAPERPNT 27 (SUTURE) ×2 IMPLANT
SYR CONTROL 10ML LL (SYRINGE) ×4 IMPLANT
TOWEL OR 17X24 6PK STRL BLUE (TOWEL DISPOSABLE) ×2 IMPLANT
TOWEL OR 17X26 10 PK STRL BLUE (TOWEL DISPOSABLE) ×2 IMPLANT
TRAY FOLEY CATH 14FR (SET/KITS/TRAYS/PACK) IMPLANT
WATER STERILE IRR 1000ML POUR (IV SOLUTION) IMPLANT

## 2013-05-24 NOTE — Progress Notes (Signed)
Returned from transporting. Enid Derry RN giving lunch relief.

## 2013-05-24 NOTE — Progress Notes (Signed)
Transporting other pt. Mark RN listening out for pt.

## 2013-05-24 NOTE — Progress Notes (Signed)
Orthopedic Tech Progress Note Patient Details:  [name] Ray 02-Jan-1934 [id number] CPM applied to RLE with appropriate settings. OHF applied to frame. No Footsie Rolls currently available.  CPM Right Knee CPM Right Knee: On Right Knee Flexion (Degrees): 90 Right Knee Extension (Degrees): 0   Asia R Thompson 05/24/2013, 12:42 PM

## 2013-05-24 NOTE — Progress Notes (Signed)
Returning from lunch. Shirley RN tates pt is ready to go to room. Pt is unhooked from monitor.

## 2013-05-24 NOTE — Progress Notes (Signed)
Orthopedic Tech Progress Note Patient Details:  [name] Ray 1933-09-18 [id number] On cpm at 8:05 pm RLE 0-90 Patient ID: [name] Ray, male   DOB: 08-14-1933, 80 y.o.   MRN: [id number]   Braulio Bosch 05/24/2013, 8:07 PM

## 2013-05-24 NOTE — Progress Notes (Signed)
Utilization review completed.  

## 2013-05-24 NOTE — Anesthesia Procedure Notes (Addendum)
Anesthesia Regional Block:  Femoral nerve block  Pre-Anesthetic Checklist: ,, timeout performed, Correct Patient, Correct Site, Correct Laterality, Correct Procedure, Correct Position, site marked, Risks and benefits discussed,  Surgical consent,  Pre-op evaluation,  At surgeon's request and post-op pain management  Laterality: Right  Prep: chloraprep       Needles:   Needle Type: Other     Needle Length: 9cm 9 cm Needle Gauge: 21 and 21 G    Additional Needles:  Procedures: ultrasound guided (picture in chart) Femoral nerve block Narrative:  Start time: 05/24/2013 8:12 AM End time: 05/24/2013 8:20 AM Injection made incrementally with aspirations every 5 mL.  Performed by: Personally  Anesthesiologist: C. Frederick MD  Additional Notes: Ultrasound guidance used to: id relevant anatomy, confirm needle position, local anesthetic spread, avoidance of vascular puncture. Picture saved. No complications. Block performed personally by Jessy Oto. Albertina Parr, MD     Procedure Name: Intubation Date/Time: 05/24/2013 9:00 AM Performed by: Trixie Deis A Pre-anesthesia Checklist: Patient identified, Timeout performed, Emergency Drugs available, Patient being monitored and Suction available Patient Re-evaluated:Patient Re-evaluated prior to inductionOxygen Delivery Method: Circle system utilized Preoxygenation: Pre-oxygenation with 100% oxygen Intubation Type: IV induction Ventilation: Mask ventilation without difficulty Laryngoscope Size: Mac and 3 Grade View: Grade I Tube type: Oral Tube size: 7.5 mm Number of attempts: 1 Airway Equipment and Method: Stylet Placement Confirmation: ETT inserted through vocal cords under direct vision,  breath sounds checked- equal and bilateral and positive ETCO2 Secured at: 22 cm Tube secured with: Tape Dental Injury: Teeth and Oropharynx as per pre-operative assessment

## 2013-05-24 NOTE — Evaluation (Signed)
Physical Therapy Evaluation Patient Details Name: [name] Ray MRN: [id number] DOB: March 10, 1933 Today's Date: 05/24/2013   History of Present Illness  elective right TKR 05/24/13.  Patient with PMH significant for CAD, MI  Clinical Impression  Patient did great with mobility today.  Patient also with 90 knee flexion and able to SLR right leg off bed.  Anticipate patient will continue to make steady gains. Patient will benefit from PT to progress mobility and ROM/strength and increase independence with mobility.     Follow Up Recommendations Home health PT    Equipment Recommendations  None recommended by PT    Recommendations for Other Services       Precautions / Restrictions Precautions Precautions: Knee Restrictions Weight Bearing Restrictions: Yes RLE Weight Bearing: Weight bearing as tolerated      Mobility  Bed Mobility Overal bed mobility: Needs Assistance Bed Mobility: Supine to Sit     Supine to sit: Min assist        Transfers Overall transfer level: Needs assistance Equipment used: Rolling walker (2 wheeled) Transfers: Sit to/from Stand Sit to Stand: Min assist            Ambulation/Gait Ambulation/Gait assistance: Min assist Ambulation Distance (Feet): 10 Feet (x2) Assistive device: Rolling walker (2 wheeled) Gait Pattern/deviations: Step-to pattern        Stairs            Wheelchair Mobility    Modified Rankin (Stroke Patients Only)       Balance Overall balance assessment: No apparent balance deficits (not formally assessed)                                           Pertinent Vitals/Pain 2/10 right knee, patient premedicated.    Home Living Family/patient expects to be discharged to:: Private residence Living Arrangements: Spouse/significant other Available Help at Discharge: Family Type of Home: House Home Access: Stairs to enter   Technical brewer of Steps: 1 Home Layout: One level Home  Equipment: Environmental consultant - 2 wheels      Prior Function Level of Independence: Independent               Hand Dominance        Extremity/Trunk Assessment   Upper Extremity Assessment: Overall WFL for tasks assessed           Lower Extremity Assessment: RLE deficits/detail RLE Deficits / Details: in CPM to 90       Communication   Communication: No difficulties  Cognition Arousal/Alertness: Awake/alert Behavior During Therapy: WFL for tasks assessed/performed Overall Cognitive Status: Within Functional Limits for tasks assessed                      General Comments      Exercises Total Joint Exercises Ankle Circles/Pumps: AROM;Both;10 reps;Seated Quad Sets: AROM;Both;10 reps;Seated      Assessment/Plan    PT Assessment Patient needs continued PT services  PT Diagnosis Difficulty walking   PT Problem List Decreased range of motion;Decreased activity tolerance;Decreased mobility;Decreased knowledge of use of DME  PT Treatment Interventions DME instruction;Gait training;Stair training;Functional mobility training;Therapeutic activities;Therapeutic exercise   PT Goals (Current goals can be found in the Care Plan section) Acute Rehab PT Goals Patient Stated Goal: go to [employer] on Thur PT Goal Formulation: With patient/family Time For Goal Achievement: 05/31/13 Potential to Achieve Goals: Good  Frequency 7X/week   Barriers to discharge        Co-evaluation               End of Session Equipment Utilized During Treatment: Gait belt Activity Tolerance: Patient tolerated treatment well Patient left: in chair;with family/visitor present;with call bell/phone within reach Nurse Communication: Mobility status         Time: 0318-0339 PT Time Calculation (min): 21 min   Charges:   PT Evaluation $Initial PT Evaluation Tier I: 1 Procedure PT Treatments $Gait Training: 8-22 mins   PT G CodesShanna Cisco, [place]   [phone] 05/24/2013, 3:50 PM

## 2013-05-24 NOTE — Addendum Note (Signed)
Addendum created 05/24/13 1310 by Leonor Liv, CRNA   Modules edited: Charges VN

## 2013-05-24 NOTE — Anesthesia Postprocedure Evaluation (Signed)
Anesthesia Post Note  Patient: [name] Ray  Procedure(s) Performed: Procedure(s) (LRB): RIGHT TOTAL KNEE ARTHROPLASTY (Right)  Anesthesia type: General  Patient location: PACU  Post pain: Pain level controlled  Post assessment: Patient's Cardiovascular Status Stable  Last Vitals:  Filed Vitals:   05/24/13 1215  BP: 127/50  Pulse: 66  Temp:   Resp: 12    Post vital signs: Reviewed and stable  Level of consciousness: alert  Complications: No apparent anesthesia complications

## 2013-05-24 NOTE — Transfer of Care (Signed)
Immediate Anesthesia Transfer of Care Note  Patient: [name] Ray  Procedure(s) Performed: Procedure(s): RIGHT TOTAL KNEE ARTHROPLASTY (Right)  Patient Location: PACU  Anesthesia Type:General  Level of Consciousness: awake, alert  and oriented  Airway & Oxygen Therapy: Patient Spontanous Breathing and Patient connected to nasal cannula oxygen  Post-op Assessment: Report given to PACU RN, Post -op Vital signs reviewed and stable and Patient moving all extremities X 4  Post vital signs: Reviewed and stable  Complications: No apparent anesthesia complications

## 2013-05-24 NOTE — Anesthesia Preprocedure Evaluation (Signed)
Anesthesia Evaluation  Patient identified by MRN, date of birth, ID band Patient awake    Reviewed: Allergy & Precautions, H&P , NPO status , Patient's Chart, lab work & pertinent test results, reviewed documented beta blocker date and time   Airway Mallampati: II TM Distance: >3 FB Neck ROM: full    Dental   Pulmonary neg pulmonary ROS,  breath sounds clear to auscultation        Cardiovascular hypertension, On Medications + CAD, + Past MI, + Cardiac Stents and + Peripheral Vascular Disease Rhythm:regular     Neuro/Psych negative neurological ROS  negative psych ROS   GI/Hepatic GERD-  Medicated and Controlled,(+) Hepatitis -, Unspecified  Endo/Other  diabetes, Oral Hypoglycemic Agents  Renal/GU negative Renal ROS  negative genitourinary   Musculoskeletal   Abdominal   Peds  Hematology negative hematology ROS (+)   Anesthesia Other Findings See surgeon's H&P   Reproductive/Obstetrics negative OB ROS                           Anesthesia Physical Anesthesia Plan  ASA: III  Anesthesia Plan: General   Post-op Pain Management:    Induction: Intravenous  Airway Management Planned: Oral ETT  Additional Equipment:   Intra-op Plan:   Post-operative Plan: Extubation in OR  Informed Consent: I have reviewed the patients History and Physical, chart, labs and discussed the procedure including the risks, benefits and alternatives for the proposed anesthesia with the patient or authorized representative who has indicated his/her understanding and acceptance.   Dental Advisory Given  Plan Discussed with: CRNA and Surgeon  Anesthesia Plan Comments:         Anesthesia Quick Evaluation

## 2013-05-24 NOTE — Op Note (Signed)
TOTAL KNEE REPLACEMENT OPERATIVE NOTE:  05/24/2013  1:29 PM  PATIENT:  [name] Ray  80 y.o. male  PRE-OPERATIVE DIAGNOSIS:  osteoarthritis right knee  POST-OPERATIVE DIAGNOSIS:  Osteoarthritis Right Knee  PROCEDURE:  Procedure(s): RIGHT TOTAL KNEE ARTHROPLASTY  SURGEON:  Surgeon(s): [name], MD  PHYSICIAN ASSISTANT: Carlynn Spry, [facility]  ANESTHESIA:   general  DRAINS: Hemovac  SPECIMEN: None  COUNTS:  Correct  TOURNIQUET:   Total Tourniquet Time Documented: Thigh (Right) - 58 minutes Total: Thigh (Right) - 58 minutes   DICTATION:  Indication for procedure:    The patient is a 80 y.o. male who has failed conservative treatment for osteoarthritis right knee.  Informed consent was obtained prior to anesthesia. The risks versus benefits of the operation were explain and in a way the patient can, and did, understand.   On the implant demand matching protocol, this patient scored 7.  Therefore, this patient was not receive a polyethylene insert with vitamin E which is a high demand implant.  Description of procedure:     The patient was taken to the operating room and placed under anesthesia.  The patient was positioned in the usual fashion taking care that all body parts were adequately padded and/or protected.  I foley catheter was not placed.  A tourniquet was applied and the leg prepped and draped in the usual sterile fashion.  The extremity was exsanguinated with the esmarch and tourniquet inflated to 350 mmHg.  Pre-operative range of motion was normal.  The knee was in 5 degree of mild varus.  A midline incision approximately 6-7 inches long was made with a #10 blade.  A new blade was used to make a parapatellar arthrotomy going 2-3 cm into the quadriceps tendon, over the patella, and alongside the medial aspect of the patellar tendon.  A synovectomy was then performed with the #10 blade and forceps. I then elevated the deep MCL off the medial tibial metaphysis  subperiosteally around to the semimembranosus attachment.    I everted the patella and used calipers to measure patellar thickness.  I used the reamer to ream down to appropriate thickness to recreate the native thickness.  I then removed excess bone with the rongeur and sagittal saw.  I used the appropriately sized template and drilled the three lug holes.  I then put the trial in place and measured the thickness with the calipers to ensure recreation of the native thickness.  The trial was then removed and the patella subluxed and the knee brought into flexion.  A homan retractor was place to retract and protect the patella and lateral structures.  A Z-retractor was place medially to protect the medial structures.  The extra-medullary alignment system was used to make cut the tibial articular surface perpendicular to the anamotic axis of the tibia and in 3 degrees of posterior slope.  The cut surface and alignment jig was removed.  I then used the intramedullary alignment guide to make a 6 valgus cut on the distal femur.  I then marked out the epicondylar axis on the distal femur.  The posterior condylar axis measured 7 degrees.  I then used the anterior referencing sizer and measured the femur to be a size 7.  The 4-In-1 cutting block was screwed into place in external rotation matching the posterior condylar angle, making our cuts perpendicular to the epicondylar axis.  Anterior, posterior and chamfer cuts were made with the sagittal saw.  The cutting block and cut pieces were removed.  A lamina spreader was placed in 90 degrees of flexion.  The ACL, PCL, menisci, and posterior condylar osteophytes were removed.  A 12 mm spacer blocked was found to offer good flexion and extension gap balance after minimal in degree releasing.   The scoop retractor was then placed and the femoral finishing block was pinned in place.  The small sagittal saw was used as well as the lug drill to finish the femur.  The block  and cut surfaces were removed and the medullary canal hole filled with autograft bone from the cut pieces.  The tibia was delivered forward in deep flexion and external rotation.  A size F tray was selected and pinned into place centered on the medial 1/3 of the tibial tubercle.  The reamer and keel was used to prepare the tibia through the tray.    I then trialed with the size 7 femur, size F tibia, a 12 mm insert and the 38 patella.  I had excellent flexion/extension gap balance, excellent patella tracking.  Flexion was full and beyond 120 degrees; extension was zero.  These components were chosen and the staff opened them to me on the back table while the knee was lavaged copiously and the cement mixed.  The soft tissue was infiltrated with 60cc of exparel 1.3% through a 21 gauge needle.  I cemented in the components and removed all excess cement.  The polyethylene tibial component was snapped into place and the knee placed in extension while cement was hardening.  The capsule was infilltrated with 30cc of .25% Marcaine with epinephrine.  A hemovac was place in the joint exiting superolaterally.  A pain pump was place superomedially superficial to the arthrotomy.  Once the cement was hard, the tourniquet was let down.  Hemostasis was obtained.  The arthrotomy was closed with figure-8 #1 vicryl sutures.  The deep soft tissues were closed with #0 vicryls and the subcuticular layer closed with a running #2-0 vicryl.  The skin was reapproximated and closed with skin staples.  The wound was dressed with xeroform, 4 x4's, 2 ABD sponges, a single layer of webril and a TED stocking.   The patient was then awakened, extubated, and taken to the recovery room in stable condition.  BLOOD LOSS:  300cc DRAINS: 1 hemovac, 1 pain catheter COMPLICATIONS:  None.  PLAN OF CARE: Admit to inpatient   PATIENT DISPOSITION:  PACU - hemodynamically stable.   Delay start of Pharmacological VTE agent (>24hrs) due to  surgical blood loss or risk of bleeding:  not applicable  Please fax a copy of this op note to my office at [phone] (please only include page 1 and 2 of the Case Information op note)

## 2013-05-24 NOTE — H&P (Signed)
[name] Ray MRN:  [id number] DOB/SEX:  1933/03/31/male  CHIEF COMPLAINT:  Painful right Knee  HISTORY: Patient is a 80 y.o. male presented with a history of pain in the right knee. Onset of symptoms was gradual starting several years ago with gradually worsening course since that time. Prior procedures on the knee include none. Patient has been treated conservatively with over-the-counter NSAIDs and activity modification. Patient currently rates pain in the knee at 10 out of 10 with activity. There is pain at night.  PAST MEDICAL HISTORY: Patient Active Problem List   Diagnosis Date Noted  . Preop cardiovascular exam 04/23/2013  . Carotid artery disease   . Coronary artery disease   . Dyslipidemia   . GERD (gastroesophageal reflux disease)   . Hypertension   . Numbness and tingling in hands   . FECAL OCCULT BLOOD 10/19/2009  . COLONIC POLYPS, ADENOMATOUS, HX OF 10/19/2009  . OVERWEIGHT 11/08/2008  . DM 10/25/2008  . GERD 10/25/2008   Past Medical History  Diagnosis Date  . GERD (gastroesophageal reflux disease)   . IBS (irritable bowel syndrome)   . Diabetes mellitus   . Adenomatous polyp     history  . Diverticulosis   . Fatty liver     history  . Dyslipidemia   . Atrial bigeminy     in the past  . Coronary artery disease     acute inferior MI Jan 2002 with stent / Taxus stent LAD 2004 / Cypher stent prox LAD and cypher stent mid circ May 2006 / nuclear stress Dec 2009 normal EF 63% / nuclear August 2011 EF 62% / inferior thining and very mild ischemia inferior and apex / catherization Sept 2011 EF 60%  mild to moderate diffuse RCA disease, moderate discrete LAD stenosis before the stent in the proximal LAD med tx rec  . Numbness and tingling in hands     resolved  . BPH (benign prostatic hyperplasia)   . Colon polyps   . Overweight   . Myocardial infarction 2002  . Hypertension     [facility] - 5 stents total , last cardiac cath. 2011.  Cleared for  surgery by  Dr. [name]- 04/23/2013  . Carotid artery disease     a. Doppler, January, 2012, 40-59% bilateral stenoses;  b.  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 Arthritis     knees , shoulders, hands   . Cancer     Prostate - treated /w  Lupron injection x1, then radium  . Hepatitis    Past Surgical History  Procedure Laterality Date  . Cardiac catheterization      multiple stents in LAD  . Carpal tunnel release Left     also surgery on thumb, elbow- nerve release   . Prostate biopsy      followed later by HDR     MEDICATIONS:   Prescriptions prior to admission  Medication Sig Dispense Refill  . amLODipine (NORVASC) 10 MG tablet Take 10 mg by mouth every evening.       Marland Kitchen aspirin 325 MG EC tablet Take 325 mg by mouth daily.        . clopidogrel (PLAVIX) 75 MG tablet Take 75 mg by mouth daily.        . Glucosamine-Chondroit-Vit C-Mn (GLUCOSAMINE 1500 COMPLEX PO) Take 1 tablet by mouth 2 (two) times daily.       Marland Kitchen Leuprolide Acetate (LUPRON IJ) Inject 45 mg as  directed every 6 (six) months.      . loratadine (CLARITIN) 10 MG tablet Take 10 mg by mouth daily as needed for allergies.       . nitroGLYCERIN (NITROSTAT) 0.4 MG SL tablet Place 0.4 mg under the tongue every 5 (five) minutes as needed for chest pain.       Marland Kitchen omeprazole (PRILOSEC) 20 MG capsule Take 20 mg by mouth daily as needed. Take 30 minutes before meals      . ramipril (ALTACE) 10 MG capsule Take 10 mg by mouth daily after breakfast.       . Saxagliptin-Metformin (KOMBIGLYZE XR) 05-998 MG TB24 Take 1 tablet by mouth 2 (two) times daily.      . simvastatin (ZOCOR) 20 MG tablet Take 20 mg by mouth at bedtime.        . tamsulosin (FLOMAX) 0.4 MG CAPS capsule Take 0.4 mg by mouth at bedtime.         ALLERGIES:   Allergies  Allergen Reactions  . Atorvastatin Other (See Comments)    Sever leg pain  . Rosuvastatin     REACTION: unknown    REVIEW OF SYSTEMS:  Pertinent items are noted in  HPI.   FAMILY HISTORY:   Family History  Problem Relation Age of Onset  . Heart attack Paternal Grandfather     SOCIAL HISTORY:   History  Substance Use Topics  . Smoking status: Never Smoker   . Smokeless tobacco: Not on file  . Alcohol Use: No     EXAMINATION:  Vital signs in last 24 hours:    General appearance: alert, cooperative and no distress Lungs: clear to auscultation bilaterally Heart: regular rate and rhythm, S1, S2 normal, no murmur, click, rub or gallop Abdomen: soft, non-tender; bowel sounds normal; no masses,  no organomegaly Extremities: extremities normal, atraumatic, no cyanosis or edema and Homans sign is negative, no sign of DVT Pulses: 2+ and symmetric Skin: Skin color, texture, turgor normal. No rashes or lesions Neurologic: Alert and oriented X 3, normal strength and tone. Normal symmetric reflexes. Normal coordination and gait  Musculoskeletal:  ROM 0-115, Ligaments intact,  Imaging Review Plain radiographs demonstrate severe degenerative joint disease of the right knee. The overall alignment is mild valgus. The bone quality appears to be good for age and reported activity level.  Assessment/Plan: End stage arthritis, right knee   The patient history, physical examination and imaging studies are consistent with advanced degenerative joint disease of the right knee. The patient has failed conservative treatment.  The clearance notes were reviewed.  After discussion with the patient it was felt that Total Knee Replacement was indicated. The procedure,  risks, and benefits of total knee arthroplasty were presented and reviewed. The risks including but not limited to aseptic loosening, infection, blood clots, vascular injury, stiffness, patella tracking problems complications among others were discussed. The patient acknowledged the explanation, agreed to proceed with the plan.  Carlynn Spry 05/24/2013, 6:39 AM

## 2013-05-25 LAB — GLUCOSE, CAPILLARY
Glucose-Capillary: 145 mg/dL — ABNORMAL HIGH (ref 70–99)
Glucose-Capillary: 145 mg/dL — ABNORMAL HIGH (ref 70–99)
Glucose-Capillary: 154 mg/dL — ABNORMAL HIGH (ref 70–99)

## 2013-05-25 LAB — BASIC METABOLIC PANEL
BUN: 19 mg/dL (ref 6–23)
CO2: 23 mEq/L (ref 19–32)
Calcium: 8.3 mg/dL — ABNORMAL LOW (ref 8.4–10.5)
Chloride: 101 mEq/L (ref 96–112)
Creatinine, Ser: 1.02 mg/dL (ref 0.50–1.35)
GFR calc Af Amer: 80 mL/min — ABNORMAL LOW (ref >90)
GFR calc non Af Amer: 69 mL/min — ABNORMAL LOW (ref >90)
Glucose, Bld: 165 mg/dL — ABNORMAL HIGH (ref 70–99)
Potassium: 4.7 mEq/L (ref 3.7–5.3)
Sodium: 137 mEq/L (ref 137–147)

## 2013-05-25 LAB — CBC
HCT: 27.3 % — ABNORMAL LOW (ref 39.0–52.0)
Hemoglobin: 9.2 g/dL — ABNORMAL LOW (ref 13.0–17.0)
MCH: 30 pg (ref 26.0–34.0)
MCHC: 33.7 g/dL (ref 30.0–36.0)
MCV: 88.9 fL (ref 78.0–100.0)
Platelets: 150 10*3/uL (ref 150–400)
RBC: 3.07 MIL/uL — ABNORMAL LOW (ref 4.22–5.81)
RDW: 13.4 % (ref 11.5–15.5)
WBC: 11.5 10*3/uL — ABNORMAL HIGH (ref 4.0–10.5)

## 2013-05-25 MED ORDER — OXYCODONE HCL 5 MG PO TABS: 5.0000 mg | ORAL_TABLET | ORAL | Status: DC | PRN

## 2013-05-25 MED ORDER — OXYCODONE HCL ER 10 MG PO T12A: 10.0000 mg | EXTENDED_RELEASE_TABLET | Freq: Two times a day (BID) | ORAL | Status: DC

## 2013-05-25 MED ORDER — METHOCARBAMOL 500 MG PO TABS: 500.0000 mg | ORAL_TABLET | Freq: Four times a day (QID) | ORAL | Status: DC | PRN

## 2013-05-25 NOTE — Progress Notes (Signed)
Patient d/c to home, IV removed, prescriptions given, instructions reviewed. 

## 2013-05-25 NOTE — Progress Notes (Signed)
Physical Therapy Treatment Patient Details Name: [name] Ray MRN: [id number] DOB: [birth year] Today's Date: 05/25/2013    History of Present Illness elective right TKR 05/24/13.  Patient with PMH significant for CAD, MI    PT Comments    Progressing well and should be ready for d/c this pm following stair training.  Follow Up Recommendations  Home health PT     Equipment Recommendations  None recommended by PT    Recommendations for Other Services       Precautions / Restrictions Precautions Precautions: Knee Restrictions Weight Bearing Restrictions: No RLE Weight Bearing: Weight bearing as tolerated    Mobility  Bed Mobility Overal bed mobility:  (NT - pt oob in chair) Bed Mobility: Supine to Sit     Supine to sit: Min guard     General bed mobility comments: HOB flat, used rails. able to scoot EOB. Need a short break (<1 min) before ready to stand.  Transfers Overall transfer level: Needs assistance Equipment used: Rolling walker (2 wheeled) Transfers: Sit to/from Stand Sit to Stand: Min guard Stand pivot transfers: Min guard       General transfer comment: cues for LE managment and use of UEs to self assist  Ambulation/Gait Ambulation/Gait assistance: Min guard Ambulation Distance (Feet): 82 Feet Assistive device: Rolling walker (2 wheeled) Gait Pattern/deviations: Step-to pattern;Decreased step length - right;Decreased step length - left;Shuffle;Trunk flexed     General Gait Details: cues for sequence, posture, position from RW and to roll walker vs lift   Stairs            Wheelchair Mobility    Modified Rankin (Stroke Patients Only)       Balance Overall balance assessment: Needs assistance Sitting-balance support: Feet supported;No upper extremity supported Sitting balance-Leahy Scale: Good     Standing balance support: Bilateral upper extremity supported;During functional activity Standing balance-Leahy Scale: Fair                       Cognition Arousal/Alertness: Awake/alert Behavior During Therapy: WFL for tasks assessed/performed Overall Cognitive Status: Within Functional Limits for tasks assessed                      Exercises Total Joint Exercises Ankle Circles/Pumps: AROM;Both;Seated;15 reps Quad Sets: AROM;Both;Seated;15 reps Heel Slides: AAROM;15 reps;Right;Supine Straight Leg Raises: AAROM;AROM;Right;20 reps;Supine    General Comments        Pertinent Vitals/Pain 5-6/10; premed, ice packs provided.    Home Living Family/patient expects to be discharged to:: Private residence Living Arrangements: Spouse/significant other;Children Available Help at Discharge: Family Type of Home: House Home Access: Stairs to enter   Home Layout: One level Home Equipment: Environmental consultant - 2 wheels;Shower seat;Bedside commode;Adaptive equipment (reacher, sock aid)      Prior Function Level of Independence: Independent          PT Goals (current goals can now be found in the care plan section) Acute Rehab PT Goals Patient Stated Goal: to go home PT Goal Formulation: With patient/family Time For Goal Achievement: 05/31/13 Potential to Achieve Goals: Good Progress towards PT goals: Progressing toward goals    Frequency  7X/week    PT Plan Current plan remains appropriate    Co-evaluation             End of Session Equipment Utilized During Treatment: Gait belt Activity Tolerance: Patient tolerated treatment well Patient left: in chair;with family/visitor present;with call bell/phone within reach  Time: 1111-1140 PT Time Calculation (min): 29 min  Charges:  $Gait Training: 8-22 mins $Therapeutic Exercise: 8-22 mins                    G Codes:      [name] May 27, 2013, 11:55 AM

## 2013-05-25 NOTE — Progress Notes (Signed)
SPORTS MEDICINE AND JOINT REPLACEMENT  Lara Mulch, MD   Carlynn Spry, PA-C [place], [place], [place]  [address]                             [phone]   PROGRESS NOTE  Subjective:  negative for Chest Pain  negative for Shortness of Breath  negative for Nausea/Vomiting   negative for Calf Pain  negative for Bowel Movement   Tolerating Diet: yes         Patient reports pain as 5 on 0-10 scale.    Objective: Vital signs in last 24 hours:   Patient Vitals for the past 24 hrs:  BP Temp Temp src Pulse Resp SpO2  05/25/13 0427 121/51 mmHg 98.1 F (36.7 C) Oral 79 18 97 %  05/25/13 0400 - - - - 16 96 %  05/25/13 0000 - - - - 18 99 %  05/24/13 2000 - - - - 18 99 %  05/24/13 1957 129/62 mmHg 97.9 F (36.6 C) Oral 84 18 100 %  05/24/13 1547 - - - - - 96 %  05/24/13 1315 134/55 mmHg 97.5 F (36.4 C) Oral 73 18 100 %  05/24/13 1245 120/51 mmHg 97.5 F (36.4 C) - 72 15 100 %  05/24/13 1230 117/49 mmHg 97.5 F (36.4 C) - 72 19 99 %  05/24/13 1215 127/50 mmHg - - 66 12 100 %  05/24/13 1200 127/53 mmHg - - 72 21 99 %  05/24/13 1145 123/48 mmHg - - 69 12 97 %  05/24/13 1130 117/53 mmHg - - - - -  05/24/13 1115 118/42 mmHg 97.6 F (36.4 C) - 73 13 97 %  05/24/13 0828 - - - 62 14 97 %  05/24/13 0824 124/44 mmHg - - 62 13 99 %  05/24/13 0820 115/45 mmHg - - 62 12 100 %  05/24/13 0818 - - - 70 14 93 %  05/24/13 0817 - - - 67 15 99 %  05/24/13 0816 - - - 65 15 100 %  05/24/13 0815 122/75 mmHg - - 66 14 99 %  05/24/13 0814 - - - 69 18 99 %    @flow {1959:LAST@   Intake/Output from previous day:   05/11 0701 - 05/12 0700 In: 2806.3 [P.O.:480; I.V.:2276.3] Out: 1030 [Urine:375; Drains:555]   Intake/Output this shift:       Intake/Output     05/11 0701 - 05/12 0700 05/12 0701 - 05/13 0700   P.O. 480    I.V. (mL/kg) 2276.3 (22.4)    IV Piggyback 50    Total Intake(mL/kg) 2806.3 (27.6)    Urine (mL/kg/hr) 375 (0.2)    Drains 555 (0.2)    Blood 100 (0)    Total Output 1030     Net +1776.3          Urine Occurrence 5 x       LABORATORY DATA:  Recent Labs  05/25/13 0459  WBC 11.5*  HGB 9.2*  HCT 27.3*  PLT 150    Recent Labs  05/25/13 0459  NA 137  K 4.7  CL 101  CO2 23  BUN 19  CREATININE 1.02  GLUCOSE 165*  CALCIUM 8.3*   Lab Results  Component Value Date   INR 0.94 05/14/2013   INR 0.9 ratio 09/25/2009    Examination:  General appearance: alert, cooperative and no distress Extremities: Homans sign is negative, no sign of DVT  Wound Exam: clean, dry, intact   Drainage:  Scant/small amount Serosanguinous exudate  Motor Exam: EHL and FHL Intact  Sensory Exam: Deep Peroneal normal   Assessment:    1 Day Post-Op  Procedure(s) (LRB): RIGHT TOTAL KNEE ARTHROPLASTY (Right)  ADDITIONAL DIAGNOSIS:  Active Problems:   S/P total knee arthroplasty  Acute Blood Loss Anemia   Plan: Physical Therapy as ordered Weight Bearing as Tolerated (WBAT)  DVT Prophylaxis:  Lovenox  DISCHARGE PLAN: Home  DISCHARGE NEEDS: HHPT, CPM, Walker and 3-in-1 comode seat         Carlynn Spry 05/25/2013, 7:51 AM

## 2013-05-25 NOTE — Discharge Instructions (Signed)
Diet: As you were doing prior to hospitalization   Activity:  Increase activity slowly as tolerated                  No lifting or driving for 6 weeks  Shower:  May shower without a dressing once there is no drainage from your wound.                 Do NOT wash over the wound.                 Dressing:  You may change your dressing on Wednesday                    Then change the dressing daily with sterile 4"x4"s gauze dressing                     And TED hose for knees.  Weight Bearing:  Weight bearing as tolerated as taught in physical therapy.  Use a                                walker or Crutches as instructed.  To prevent constipation: you may use a stool softener such as -               Colace ( over the counter) 100 mg by mouth twice a day                Drink plenty of fluids ( prune juice may be helpful) and high fiber foods                Miralax ( over the counter) for constipation as needed.    Precautions:  If you experience chest pain or shortness of breath - call 911 immediately               For transfer to the hospital emergency department!!               If you develop a fever greater that 101 F, purulent drainage from wound,                             increased redness or drainage from wound, or calf pain -- Call the office.  Follow- Up Appointment:  Please call for an appointment to be seen on 06/08/13                                              [facility] office:  [phone]            [address] [place], [place] [address]

## 2013-05-25 NOTE — Evaluation (Signed)
Occupational Therapy Evaluation Patient Details Name: [name] Ray MRN: [id number] DOB: [birth year] Today's Date: 05/25/2013    History of Present Illness elective right TKR 05/24/13.  Patient with PMH significant for CAD, MI   Clinical Impression   Pt seen for acute OT evaluation. Pt progressing well towards independence with basic ADLs. Performed transfers and functional mobility with min guard. Performed supine > EOB with HOB flat but did use rails. Discussed using edge of mattress or assistance from spouse to supine>EOB at home. Pt has recommended equipment at home already. Reviewed and educated on use of AE for LB ADLs. Discussed with spouse using BSC in bedroom at night and ambulating to toilet with BSC over it during the day to decrease fall risk at night. No further OT needs at this time.    Follow Up Recommendations  No OT follow up    Equipment Recommendations       Recommendations for Other Services       Precautions / Restrictions Precautions Precautions: Knee Restrictions Weight Bearing Restrictions: Yes RLE Weight Bearing: Weight bearing as tolerated      Mobility Bed Mobility Overal bed mobility: Needs Assistance Bed Mobility: Supine to Sit     Supine to sit: Min guard     General bed mobility comments: HOB flat, used rails. able to scoot EOB. Need a short break (<1 min) before ready to stand.  Transfers Overall transfer level: Needs assistance Equipment used: Rolling walker (2 wheeled) Transfers: Sit to/from [facility] Sit to Stand: Min guard Stand pivot transfers: Min guard       General transfer comment: cues to keep rw on the ground and not lift it up during pivoting.    Balance Overall balance assessment: Needs assistance Sitting-balance support: Feet supported;No upper extremity supported Sitting balance-Leahy Scale: Good     Standing balance support: Bilateral upper extremity supported;During functional activity Standing  balance-Leahy Scale: Fair                              ADL Overall ADL's : Needs assistance/impaired Eating/Feeding: Set up;Sitting   Grooming: Set up;Standing;Sitting   Upper Body Bathing: Min guard;Sitting   Lower Body Bathing: Min guard;With adaptive equipment;Sit to/from stand   Upper Body Dressing : Set up;Sitting   Lower Body Dressing: Min guard;With adaptive equipment;Sit to/from stand   Toilet Transfer: Min guard;Ambulation   Toileting- Clothing Manipulation and Hygiene: Min guard;Sit to/from stand   Tub/ Shower Transfer: Min guard;Ambulation;Shower seat;Rolling walker   Functional mobility during ADLs: Rolling walker;Min guard General ADL Comments: Educated on AE for LB ADLs. Min guard for finctional mobility and transfers.      Vision                     Perception     Praxis      Pertinent Vitals/Pain 1/10 pain at start of session. 3/10 pain at end of session.     Hand Dominance     Extremity/Trunk Assessment Upper Extremity Assessment Upper Extremity Assessment: Overall WFL for tasks assessed   Lower Extremity Assessment Lower Extremity Assessment: Defer to PT evaluation       Communication Communication Communication: No difficulties   Cognition Arousal/Alertness: Awake/alert Behavior During Therapy: WFL for tasks assessed/performed Overall Cognitive Status: Within Functional Limits for tasks assessed                     General  Comments       Exercises       Shoulder Instructions      Home Living Family/patient expects to be discharged to:: Private residence Living Arrangements: Spouse/significant other;Children Available Help at Discharge: Family Type of Home: House Home Access: Stairs to enter Technical brewer of Steps: 1   Home Layout: One level         Biochemist, clinical: [place]: Environmental consultant - 2 wheels;Shower seat;Bedside commode;Adaptive equipment (reacher, sock  aid) Adaptive Equipment: Reacher;Sock aid        Prior Functioning/Environment Level of Independence: Independent             OT Diagnosis:     OT Problem List:     OT Treatment/Interventions:      OT Goals(Current goals can be found in the care plan section) Acute Rehab OT Goals Patient Stated Goal: to go home  OT Frequency:     Barriers to D/C:            Co-evaluation              End of Session Equipment Utilized During Treatment: Gait belt;Rolling walker Nurse Communication: Other (comment) (performance during session)  Activity Tolerance: Patient tolerated treatment well Patient left: in chair;with call bell/phone within reach;with family/visitor present   Time: 7824-2353 OT Time Calculation (min): 28 min Charges:  OT General Charges $OT Visit: 1 Procedure OT Evaluation $Initial OT Evaluation Tier I: 1 Procedure OT Treatments $Self Care/Home Management : 8-22 mins G-Codes:    Hortencia Pilar 05/28/13, 9:27 AM

## 2013-05-25 NOTE — Progress Notes (Signed)
Physical Therapy Treatment Patient Details Name: [name] Ray MRN: [id number] DOB: [birth year] Today's Date: 05-30-2013    History of Present Illness elective right TKR 05/24/13.  Patient with PMH significant for CAD, MI    PT Comments      Follow Up Recommendations  Home health PT     Equipment Recommendations  None recommended by PT    Recommendations for Other Services       Precautions / Restrictions Precautions Precautions: Knee Restrictions Weight Bearing Restrictions: No RLE Weight Bearing: Weight bearing as tolerated    Mobility  Bed Mobility Overal bed mobility: Needs Assistance Bed Mobility: Supine to Sit;Sit to Supine     Supine to sit: Min guard Sit to supine: Min guard   General bed mobility comments: cues for sequence and use of L LE to self assist  Transfers Overall transfer level: Needs assistance Equipment used: Rolling walker (2 wheeled) Transfers: Sit to/from Stand Sit to Stand: Supervision         General transfer comment: cues for LE managment and use of UEs to self assist  Ambulation/Gait Ambulation/Gait assistance: Min guard;Supervision Ambulation Distance (Feet): 60 Feet Assistive device: Rolling walker (2 wheeled) Gait Pattern/deviations: Step-to pattern;Decreased step length - right;Decreased step length - left;Shuffle;Trunk flexed Gait velocity: decr   General Gait Details: cues for sequence, posture, position from RW and to roll walker vs lift   Stairs Stairs: Yes Stairs assistance: Min assist Stair Management: No rails;Step to pattern;With walker;Backwards;Forwards Number of Stairs: 1 (three times) General stair comments: Pt up/down single step fwd and bkwd twice with spouse present  Wheelchair Mobility    Modified Rankin (Stroke Patients Only)       Balance                                    Cognition Arousal/Alertness: Awake/alert Behavior During Therapy: WFL for tasks  assessed/performed Overall Cognitive Status: Within Functional Limits for tasks assessed                      Exercises      General Comments        Pertinent Vitals/Pain 4/10; premed    Home Living                      Prior Function            PT Goals (current goals can now be found in the care plan section) Acute Rehab PT Goals Patient Stated Goal: to go home PT Goal Formulation: With patient/family Time For Goal Achievement: 05/31/13 Potential to Achieve Goals: Good Progress towards PT goals: Progressing toward goals    Frequency  7X/week    PT Plan Current plan remains appropriate    Co-evaluation             End of Session Equipment Utilized During Treatment: Gait belt Activity Tolerance: Patient tolerated treatment well Patient left: in bed;with call bell/phone within reach;with family/visitor present     Time: 4782-9562 PT Time Calculation (min): 18 min  Charges:  $Gait Training: 8-22 mins                    G Codes:      [name] Ray May 30, 2013, 5:57 PM

## 2013-05-25 NOTE — Care Management Note (Signed)
CARE MANAGEMENT NOTE 05/25/2013  Patient:  [name] Ray, [name] Ray   Account Number:  [id number]  Date Initiated:  05/24/2013  Documentation initiated by:  [facility]  Subjective/Objective Assessment:   admitted s/p rt TKA     Action/Plan:   PT/OT evals   Anticipated DC Date:  05/26/2013   Anticipated DC Plan:  [place]  CM consult      Choice offered to / List presented to:        DME agency  [employer]     [place] arranged  [facility] PT      [place]   Status of service:  Completed, signed off Medicare Important Message given?   (If response is "NO", the following Medicare IM given date fields will be blank) Date Medicare IM given:   Date Additional Medicare IM given:    Discharge Disposition:  [place]  Per UR Regulation:    If discussed at Long Length of Stay Meetings, dates discussed:    Comments:

## 2013-05-27 ENCOUNTER — Encounter (HOSPITAL_COMMUNITY): Payer: Self-pay | Admitting: Orthopedic Surgery

## 2013-05-28 NOTE — Discharge Summary (Signed)
[place]   Lara Mulch, MD   Carlynn Spry, PA-C [place], [place], [place]  [address]                             [phone]  PATIENT ID: [name] Ray        MRN:  [id number]          DOB/AGE: 02-09-1933 / 80 y.o.    DISCHARGE SUMMARY  ADMISSION DATE:    05/24/2013 DISCHARGE DATE:   05/25/2013  ADMISSION DIAGNOSIS: osteoarthritis right knee    DISCHARGE DIAGNOSIS:  osteoarthritis right knee    ADDITIONAL DIAGNOSIS: Active Problems:   S/P total knee arthroplasty  Past Medical History  Diagnosis Date  . GERD (gastroesophageal reflux disease)   . IBS (irritable bowel syndrome)   . Adenomatous polyp     history  . Diverticulosis   . Fatty liver     history  . Dyslipidemia   . Atrial bigeminy     in the past  . Coronary artery disease     acute inferior MI Jan 2002 with stent / Taxus stent LAD 2004 / Cypher stent prox LAD and cypher stent mid circ May 2006 / nuclear stress Dec 2009 normal EF 63% / nuclear August 2011 EF 62% / inferior thining and very mild ischemia inferior and apex / catherization Sept 2011 EF 60%  mild to moderate diffuse RCA disease, moderate discrete LAD stenosis before the stent in the proximal LAD med tx rec  . Numbness and tingling in hands     resolved  . BPH (benign prostatic hyperplasia)   . Colon polyps   . Overweight   . Hypertension     [facility] - 5 stents total , last cardiac cath. 2011.  Cleared for  surgery by Dr. [name]- 04/23/2013  . Carotid artery disease     a. Doppler, January, 2012, 40-59% bilateral stenoses;  b.  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 Myocardial infarction 2002  . Type II diabetes mellitus   . Arthritis     knees , shoulders, hands   . Prostate cancer     treated /w  Lupron injection x1, then radium    PROCEDURE: Procedure(s): RIGHT TOTAL KNEE ARTHROPLASTY on 05/24/2013  CONSULTS:     HISTORY:  See H&P in chart  HOSPITAL  COURSE:  [name] Ray is a 80 y.o. admitted on 05/24/2013 and found to have a diagnosis of osteoarthritis right knee.  After appropriate laboratory studies were obtained  they were taken to the operating room on 05/24/2013 and underwent Procedure(s): RIGHT TOTAL KNEE ARTHROPLASTY.   They were given perioperative antibiotics:  Anti-infectives   Start     Dose/Rate Route Frequency Ordered Stop   05/24/13 1500  ceFAZolin (ANCEF) IVPB 2 g/50 mL premix     2 g 100 mL/hr over 30 Minutes Intravenous Every 6 hours 05/24/13 1259 05/24/13 2334   05/24/13 0600  ceFAZolin (ANCEF) IVPB 2 g/50 mL premix     2 g 100 mL/hr over 30 Minutes Intravenous On call to O.R. 05/23/13 1428 05/24/13 0902    .  Tolerated the procedure well.  Placed with a foley intraoperatively.  Given Ofirmev at induction and for 48 hours.    POD# 1: Vital signs were stable.  Patient denied Chest pain, shortness of breath, or calf pain.  Patient  was started on Lovenox 30 mg subcutaneously twice daily at 8am.  Consults to PT, OT, and care management were made.  The patient was weight bearing as tolerated.  CPM was placed on the operative leg 0-90 degrees for 6-8 hours a day.  Incentive spirometry was taught.  Dressing was changed.  Marcaine pump and hemovac were discontinued.       Continued  PT for ambulation and exercise program.  IV saline locked.  O2 discontinued.    The remainder of the hospital course was dedicated to ambulation and strengthening.   The patient was discharged on post op day 1 in  Good condition.  Blood products given:none  DIAGNOSTIC STUDIES: Recent vital signs: No data found.      Recent laboratory studies:  Recent Labs  05/25/13 0459  WBC 11.5*  HGB 9.2*  HCT 27.3*  PLT 150    Recent Labs  05/25/13 0459  NA 137  K 4.7  CL 101  CO2 23  BUN 19  CREATININE 1.02  GLUCOSE 165*  CALCIUM 8.3*   Lab Results  Component Value Date   INR 0.94 05/14/2013   INR 0.9 ratio 09/25/2009      Recent Radiographic Studies :  Dg Chest 2 View  05/14/2013   CLINICAL DATA:  preop  EXAM: CHEST  2 VIEW  COMPARISON:  None.  FINDINGS: There is no focal parenchymal opacity, pleural effusion, or pneumothorax. The heart and mediastinal contours are unremarkable.  There is mild thoracic spine spondylosis.  IMPRESSION: No active cardiopulmonary disease.   Electronically Signed   By: Kathreen Devoid   On: 05/14/2013 11:31    DISCHARGE INSTRUCTIONS: Discharge Instructions   CPM    Complete by:  As directed   Continuous passive motion machine (CPM):      Use the CPM from 0 to 90 for 6-8 hours per day.      You may increase by 10 per day.  You may break it up into 2 or 3 sessions per day.      Use CPM for 2 weeks or until you are told to stop.     Call MD / Call 911    Complete by:  As directed   If you experience chest pain or shortness of breath, CALL 911 and be transported to the hospital emergency room.  If you develope a fever above 101 F, pus (white drainage) or increased drainage or redness at the wound, or calf pain, call your surgeon's office.     Change dressing    Complete by:  As directed   Change dressing on Wednesday, then change the dressing daily with sterile 4 x 4 inch gauze dressing and apply TED hose.     Constipation Prevention    Complete by:  As directed   Drink plenty of fluids.  Prune juice may be helpful.  You may use a stool softener, such as Colace (over the counter) 100 mg twice a day.  Use MiraLax (over the counter) for constipation as needed.     Diet - low sodium heart healthy    Complete by:  As directed      Do not put a pillow under the knee. Place it under the heel.    Complete by:  As directed      Driving restrictions    Complete by:  As directed   No driving for 6 weeks     Increase activity slowly as tolerated    Complete by:  As directed      Lifting restrictions    Complete by:  As directed   No lifting for 6 weeks     TED hose    Complete by:  As  directed   Use stockings (TED hose) for 3 weeks on both leg(s).  You may remove them at night for sleeping.           DISCHARGE MEDICATIONS:     Medication List         amLODipine 10 MG tablet  Commonly known as:  NORVASC  Take 10 mg by mouth every evening.     aspirin 325 MG EC tablet  Take 325 mg by mouth daily.     clopidogrel 75 MG tablet  Commonly known as:  PLAVIX  Take 75 mg by mouth daily.     GLUCOSAMINE 1500 COMPLEX PO  Take 1 tablet by mouth 2 (two) times daily.     KOMBIGLYZE XR 05-998 MG Tb24  Generic drug:  Saxagliptin-Metformin  Take 1 tablet by mouth 2 (two) times daily.     loratadine 10 MG tablet  Commonly known as:  CLARITIN  Take 10 mg by mouth daily as needed for allergies.     LUPRON IJ  Inject 45 mg as directed every 6 (six) months.     methocarbamol 500 MG tablet  Commonly known as:  ROBAXIN  Take 1-2 tablets (500-1,000 mg total) by mouth every 6 (six) hours as needed for muscle spasms.     nitroGLYCERIN 0.4 MG SL tablet  Commonly known as:  NITROSTAT  Place 0.4 mg under the tongue every 5 (five) minutes as needed for chest pain.     omeprazole 20 MG capsule  Commonly known as:  PRILOSEC  Take 20 mg by mouth daily as needed. Take 30 minutes before meals     oxyCODONE 5 MG immediate release tablet  Commonly known as:  Oxy IR/ROXICODONE  Take 1-2 tablets (5-10 mg total) by mouth every 4 (four) hours as needed for breakthrough pain.     OxyCODONE 10 mg T12a 12 hr tablet  Commonly known as:  OXYCONTIN  Take 1 tablet (10 mg total) by mouth every 12 (twelve) hours.     ramipril 10 MG capsule  Commonly known as:  ALTACE  Take 10 mg by mouth daily after breakfast.     simvastatin 20 MG tablet  Commonly known as:  ZOCOR  Take 20 mg by mouth at bedtime.     tamsulosin 0.4 MG Caps capsule  Commonly known as:  FLOMAX  Take 0.4 mg by mouth at bedtime.        FOLLOW UP VISIT:       Follow-up Information   Follow up with  Rudean Haskell, MD. Call on 06/08/2013.   Specialty:  Orthopedic Surgery   Contact information:   [place] [address] [phone]       DISPOSITION: HOME   CONDITION:  [name] Ray 05/28/2013, 9:19 AM

## 2013-10-27 ENCOUNTER — Ambulatory Visit (INDEPENDENT_AMBULATORY_CARE_PROVIDER_SITE_OTHER): Payer: Medicare Other | Admitting: Cardiology

## 2013-10-27 ENCOUNTER — Encounter: Payer: Self-pay | Admitting: Cardiology

## 2013-10-27 VITALS — BP 128/58 | HR 73 | Ht 65.0 in | Wt 220.1 lb

## 2013-10-27 DIAGNOSIS — I10 Essential (primary) hypertension: Secondary | ICD-10-CM

## 2013-10-27 DIAGNOSIS — E785 Hyperlipidemia, unspecified: Secondary | ICD-10-CM

## 2013-10-27 DIAGNOSIS — Z96659 Presence of unspecified artificial knee joint: Secondary | ICD-10-CM

## 2013-10-27 DIAGNOSIS — I779 Disorder of arteries and arterioles, unspecified: Secondary | ICD-10-CM

## 2013-10-27 DIAGNOSIS — I739 Peripheral vascular disease, unspecified: Secondary | ICD-10-CM

## 2013-10-27 DIAGNOSIS — I2510 Atherosclerotic heart disease of native coronary artery without angina pectoris: Secondary | ICD-10-CM

## 2013-10-27 NOTE — Patient Instructions (Signed)
Your physician recommends that you continue on your current medications as directed. Please refer to the Current Medication list given to you today.  Your physician wants you to follow-up in: 6 months. You will receive a reminder letter in the mail two months in advance. If you don't receive a letter, please call our office to schedule the follow-up appointment.  

## 2013-10-27 NOTE — Progress Notes (Signed)
Patient ID: ANTWIONE PICOTTE, male   DOB: [birth year], 80 y.o.   MRN: [id number]    HPI  Patient is seen to followup his coronary disease. I saw him last April, 2015. We arranged for followup carotid Dopplers and this was done. There is stable. He will have a one-year followup. I also cleared him to have his knee surgery. This was done and he did well. He may have the other knee done over the next year. He is more active now with his knee improved and he is not having any significant chest pain or shortness of breath.  Allergies  Allergen Reactions  . Atorvastatin Other (See Comments)    Sever leg pain  . Rosuvastatin     REACTION: unknown  . Clindamycin Rash    Patient states may cause a rash. Did not want to take medicine     Current Outpatient Prescriptions  Medication Sig Dispense Refill  . amLODipine (NORVASC) 10 MG tablet Take 5 mg by mouth every evening.       Marland Kitchen aspirin 325 MG EC tablet Take 325 mg by mouth daily.        . clopidogrel (PLAVIX) 75 MG tablet Take 75 mg by mouth daily.        . Cyanocobalamin (VITAMIN B 12 PO) Take 500 mcg by mouth 2 (two) times daily.      . Glucosamine-Chondroit-Vit C-Mn (GLUCOSAMINE 1500 COMPLEX PO) Take 1 tablet by mouth 2 (two) times daily.       Marland Kitchen loratadine (CLARITIN) 10 MG tablet Take 10 mg by mouth daily as needed for allergies.       . Multiple Vitamin (MULTIVITAMIN) tablet Take 1 tablet by mouth daily. Men's 50+      . nitroGLYCERIN (NITROSTAT) 0.4 MG SL tablet Place 0.4 mg under the tongue every 5 (five) minutes as needed for chest pain.       Marland Kitchen omeprazole (PRILOSEC) 20 MG capsule Take 20 mg by mouth daily as needed. Take 30 minutes before meals      . ramipril (ALTACE) 10 MG capsule Take 10 mg by mouth daily after breakfast.       . Saxagliptin-Metformin (KOMBIGLYZE XR) 05-998 MG TB24 Take 1 tablet by mouth 2 (two) times daily.      . simvastatin (ZOCOR) 20 MG tablet Take 20 mg by mouth at bedtime.        . tamsulosin (FLOMAX) 0.4 MG CAPS  capsule Take 0.4 mg by mouth at bedtime.        No current facility-administered medications for this visit.    History   Social History  . Marital Status: Married    Spouse Name: N/A    Number of Children: N/A  . Years of Education: N/A   Occupational History  . Not on file.   Social History Main Topics  . Smoking status: Never Smoker   . Smokeless tobacco: Never Used  . Alcohol Use: No  . Drug Use: No  . Sexual Activity: Not Currently   Other Topics Concern  . Not on file   Social History Narrative  . No narrative on file    Family History  Problem Relation Age of Onset  . Heart attack Paternal Grandfather     Past Medical History  Diagnosis Date  . GERD (gastroesophageal reflux disease)   . IBS (irritable bowel syndrome)   . Adenomatous polyp     history  . Diverticulosis   . Fatty liver  history  . Dyslipidemia   . Atrial bigeminy     in the past  . Coronary artery disease     acute inferior MI Jan 2002 with stent / Taxus stent LAD 2004 / Cypher stent prox LAD and cypher stent mid circ May 2006 / nuclear stress Dec 2009 normal EF 63% / nuclear August 2011 EF 62% / inferior thining and very mild ischemia inferior and apex / catherization Sept 2011 EF 60%  mild to moderate diffuse RCA disease, moderate discrete LAD stenosis before the stent in the proximal LAD med tx rec  . Numbness and tingling in hands     resolved  . BPH (benign prostatic hyperplasia)   . Colon polyps   . Overweight(278.02)   . Hypertension     [facility] - 5 stents total , last cardiac cath. 2011.  Cleared for  surgery by Dr. [name]- 04/23/2013  . Carotid artery disease     a. Doppler, January, 2012, 40-59% bilateral stenoses;  b.  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 Myocardial infarction 2002  . Type II diabetes mellitus   . Arthritis     knees , shoulders, hands   . Prostate cancer     treated /w  Lupron injection x1, then radium     Past Surgical History  Procedure Laterality Date  . Neuroplasty / transposition median nerve at carpal tunnel Left     also surgery on thumb, elbow- nerve release   . Prostate biopsy      followed later by [facility]  . Total knee arthroplasty Right 05/24/2013  . Cardiac catheterization    . Coronary angioplasty with stent placement      multiple stents in LAD:  "I've got 5 stents" (05/24/2013)  . Total knee arthroplasty Right 05/24/2013    Procedure: RIGHT TOTAL KNEE ARTHROPLASTY;  Surgeon: [name], MD;  Location: [facility];  Service: Orthopedics;  Laterality: Right;    Patient Active Problem List   Diagnosis Date Noted  . Carotid artery disease     Priority: High  . Coronary artery disease     Priority: High  . Dyslipidemia     Priority: High  . Hypertension     Priority: High  . Numbness and tingling in hands     Priority: High  . S/P total knee arthroplasty 05/24/2013  . Preop cardiovascular exam 04/23/2013  . GERD (gastroesophageal reflux disease)   . FECAL OCCULT BLOOD 10/19/2009  . COLONIC POLYPS, ADENOMATOUS, HX OF 10/19/2009  . OVERWEIGHT 11/08/2008  . DM 10/25/2008  . GERD 10/25/2008    ROS   Patient denies fever, chills, headache, sweats, rash, change in vision, change in hearing, chest pain, cough, nausea vomiting, urinary symptoms. All other systems are reviewed and are negative.  PHYSICAL EXAM  Patient is overweight but stable. Head is atraumatic. Lungs are clear. Respiratory effort is nonlabored. There is no jugulovenous distention. Cardiac exam reveals S1 and S2. The abdomen is soft. There is no significant peripheral edema.  Filed Vitals:   10/27/13 0919  BP: 128/58  Pulse: 73  Height: 5\' 5"  (1.651 m)  Weight: 220 lb 1.9 oz (99.846 kg)  SpO2: 98%     ASSESSMENT & PLAN

## 2013-10-27 NOTE — Assessment & Plan Note (Signed)
Carotids are being followed carefully. There stable.

## 2013-10-27 NOTE — Assessment & Plan Note (Signed)
Blood pressures control. No change in therapy. 

## 2013-10-27 NOTE — Assessment & Plan Note (Signed)
Coronary disease is stable. He does not need any further workup at this time. He is on appropriate medications.

## 2013-10-27 NOTE — Assessment & Plan Note (Signed)
I have talked with him in the past about increasing his meds for guidelines directed therapy. He prefers to stay on his current dose of simvastatin.

## 2014-01-19 ENCOUNTER — Telehealth: Payer: Self-pay | Admitting: Cardiology

## 2014-01-19 NOTE — Telephone Encounter (Signed)
Walk in pt form " [facility]" paper dropped off gave to [employer] [facility]

## 2014-01-21 ENCOUNTER — Telehealth: Payer: Self-pay | Admitting: Cardiology

## 2014-01-21 NOTE — Telephone Encounter (Signed)
Received request from Nurse fax box, documents faxed for surgical clearance. To: [facility]  Fax number: [phone] Attention: 1.8.16/km

## 2014-01-24 ENCOUNTER — Other Ambulatory Visit: Payer: Self-pay | Admitting: Orthopedic Surgery

## 2014-02-02 ENCOUNTER — Telehealth: Payer: Self-pay | Admitting: Cardiology

## 2014-02-02 ENCOUNTER — Encounter (HOSPITAL_COMMUNITY)
Admission: RE | Admit: 2014-02-02 | Discharge: 2014-02-02 | Disposition: A | Discharge status: Home / Self Care | Payer: Medicare Other | Source: Ambulatory Visit | Attending: Orthopedic Surgery | Admitting: Orthopedic Surgery

## 2014-02-02 ENCOUNTER — Encounter (HOSPITAL_COMMUNITY): Payer: Self-pay

## 2014-02-02 DIAGNOSIS — Z01818 Encounter for other preprocedural examination: Secondary | ICD-10-CM | POA: Diagnosis present

## 2014-02-02 DIAGNOSIS — M1712 Unilateral primary osteoarthritis, left knee: Secondary | ICD-10-CM | POA: Diagnosis not present

## 2014-02-02 HISTORY — DX: Reserved for inherently not codable concepts without codable children: IMO0001

## 2014-02-02 LAB — URINALYSIS, ROUTINE W REFLEX MICROSCOPIC
Bilirubin Urine: NEGATIVE
Glucose, UA: NEGATIVE mg/dL
Ketones, ur: NEGATIVE mg/dL
Nitrite: NEGATIVE
Protein, ur: NEGATIVE mg/dL
Specific Gravity, Urine: 1.021 (ref 1.005–1.030)
Urobilinogen, UA: 0.2 mg/dL (ref 0.0–1.0)
pH: 5 (ref 5.0–8.0)

## 2014-02-02 LAB — PROTIME-INR
INR: 1.01 (ref 0.00–1.49)
Prothrombin Time: 13.4 seconds (ref 11.6–15.2)

## 2014-02-02 LAB — COMPREHENSIVE METABOLIC PANEL
ALT: 14 U/L (ref 0–53)
AST: 19 U/L (ref 0–37)
Albumin: 3.7 g/dL (ref 3.5–5.2)
Alkaline Phosphatase: 55 U/L (ref 39–117)
Anion gap: 10 (ref 5–15)
BUN: 22 mg/dL (ref 6–23)
CO2: 26 mmol/L (ref 19–32)
Calcium: 9.2 mg/dL (ref 8.4–10.5)
Chloride: 103 mEq/L (ref 96–112)
Creatinine, Ser: 1.19 mg/dL (ref 0.50–1.35)
GFR calc Af Amer: 66 mL/min — ABNORMAL LOW (ref >90)
GFR calc non Af Amer: 57 mL/min — ABNORMAL LOW (ref >90)
Glucose, Bld: 128 mg/dL — ABNORMAL HIGH (ref 70–99)
Potassium: 4.6 mmol/L (ref 3.5–5.1)
Sodium: 139 mmol/L (ref 135–145)
Total Bilirubin: 0.5 mg/dL (ref 0.3–1.2)
Total Protein: 6.5 g/dL (ref 6.0–8.3)

## 2014-02-02 LAB — CBC WITH DIFFERENTIAL/PLATELET
Basophils Absolute: 0 10*3/uL (ref 0.0–0.1)
Basophils Relative: 0 % (ref 0–1)
Eosinophils Absolute: 0.4 10*3/uL (ref 0.0–0.7)
Eosinophils Relative: 5 % (ref 0–5)
HCT: 38.7 % — ABNORMAL LOW (ref 39.0–52.0)
Hemoglobin: 12.7 g/dL — ABNORMAL LOW (ref 13.0–17.0)
Lymphocytes Relative: 16 % (ref 12–46)
Lymphs Abs: 1.2 10*3/uL (ref 0.7–4.0)
MCH: 28.6 pg (ref 26.0–34.0)
MCHC: 32.8 g/dL (ref 30.0–36.0)
MCV: 87.2 fL (ref 78.0–100.0)
Monocytes Absolute: 0.6 10*3/uL (ref 0.1–1.0)
Monocytes Relative: 9 % (ref 3–12)
Neutro Abs: 4.9 10*3/uL (ref 1.7–7.7)
Neutrophils Relative %: 70 % (ref 43–77)
Platelets: 186 10*3/uL (ref 150–400)
RBC: 4.44 MIL/uL (ref 4.22–5.81)
RDW: 14.6 % (ref 11.5–15.5)
WBC: 7 10*3/uL (ref 4.0–10.5)

## 2014-02-02 LAB — URINE MICROSCOPIC-ADD ON

## 2014-02-02 LAB — APTT: aPTT: 27 seconds (ref 24–37)

## 2014-02-02 LAB — SURGICAL PCR SCREEN
MRSA, PCR: NEGATIVE
Staphylococcus aureus: NEGATIVE

## 2014-02-02 NOTE — Telephone Encounter (Signed)
Advised [name] Ray not [name]

## 2014-02-02 NOTE — Progress Notes (Signed)
Call to Dr. Ruel Favors office, Linton Rump is not available but a message will be given to receptionist for them to call us or the pt. To respond to the plan for the aspirin.

## 2014-02-02 NOTE — Pre-Procedure Instructions (Signed)
[name] Ray  02/02/2014   Your procedure is scheduled on:  02/14/2014  Report to [facility] Admitting at 7:45 AM.  Call this number if you have problems the morning of surgery: [phone]   Remember:   Do not eat food or drink liquids after midnight.  On SUNDAY   Take these medicines the morning of surgery with A SIP OF WATER: omeprazole                       Take last dose of Plavix on Sunday- Jan. 24th     Do not wear jewelry   Do not wear lotions, powders, or perfumes. You may wear deodorant.    Men may shave face and neck.   Do not bring valuables to the hospital.  [facility] is not responsible                  for any belongings or valuables.               Contacts, dentures or bridgework may not be worn into surgery.   Leave suitcase in the car. After surgery it may be brought to your room.   For patients admitted to the hospital, discharge time is determined by your                treatment team.               Patients discharged the day of surgery will not be allowed to drive  home.  Name and phone number of your driver: /w family  Special Instructions: Special Instructions: [name] Ray - Preparing for Surgery  Before surgery, you can play an important role.  Because skin is not sterile, your skin needs to be as free of germs as possible.  You can reduce the number of germs on you skin by washing with CHG (chlorahexidine gluconate) soap before surgery.  CHG is an antiseptic cleaner which kills germs and bonds with the skin to continue killing germs even after washing.  Please DO NOT use if you have an allergy to CHG or antibacterial soaps.  If your skin becomes reddened/irritated stop using the CHG and inform your nurse when you arrive at Short Stay.  Do not shave (including legs and underarms) for at least 48 hours prior to the first CHG shower.  You may shave your face.  Please follow these instructions carefully:   1.  Shower with CHG Soap the night  before surgery and the  morning of Surgery.  2.  If you choose to wash your hair, wash your hair first as usual with your  normal shampoo.  3.  After you shampoo, rinse your hair and body thoroughly to remove the  Shampoo.  4.  Use CHG as you would any other liquid soap.  You can apply chg directly to the skin and wash gently with scrungie or a clean washcloth.  5.  Apply the CHG Soap to your body ONLY FROM THE NECK DOWN.    Do not use on open wounds or open sores.  Avoid contact with your eyes, ears, mouth and genitals (private parts).  Wash genitals (private parts)   with your normal soap.  6.  Wash thoroughly, paying special attention to the area where your surgery will be performed.  7.  Thoroughly rinse your body with warm water from the neck down.  8.  DO NOT shower/wash with your normal soap after  using and rinsing off   the CHG Soap.  9.  Pat yourself dry with a clean towel.            10.  Wear clean pajamas.            11.  Place clean sheets on your bed the night of your first shower and do not sleep with pets.  Day of Surgery  Do not apply any lotions/deodorants the morning of surgery.  Please wear clean clothes to the hospital/surgery center.   Please read over the following fact sheets that you were given: Pain Booklet, Coughing and Deep Breathing, MRSA Information and Surgical Site Infection Prevention

## 2014-02-02 NOTE — Progress Notes (Signed)
Call to Dr. [name] office left message for triage nurse to inquire about aspirin holding with Plavix, or to just hold Plavix.

## 2014-02-02 NOTE — Progress Notes (Signed)
   02/02/14 1033  OBSTRUCTIVE SLEEP APNEA  Have you ever been diagnosed with sleep apnea through a sleep study? No  Do you snore loudly (loud enough to be heard through closed doors)?  0  Do you often feel tired, fatigued, or sleepy during the daytime? 0  Has anyone observed you stop breathing during your sleep? 0  Do you have, or are you being treated for high blood pressure? 1  BMI more than 35 kg/m2? 1  Age over 79 years old? 1  Neck circumference greater than 40 cm/16 inches? 0  Gender: 1  Obstructive Sleep Apnea Score 4  Score 4 or greater  Results sent to PCP

## 2014-02-02 NOTE — Progress Notes (Signed)
Spoke again with Rip Harbour she will fax the message.

## 2014-02-02 NOTE — Telephone Encounter (Signed)
New Msg       Beth from [facility] short stay calling with questions about pt taking aspirin.     Please contact Beth at [phone].

## 2014-02-02 NOTE — Progress Notes (Signed)
Call from [place] from the [facility]- she will fax the message that reflects the plan for the plavix; & aspirin based on what Dr. Ronnie Derby feels is best for this pt.

## 2014-02-02 NOTE — Telephone Encounter (Signed)
Spoke with Eustaquio Maize and read information written by Dr [name] on surgical clearance sent to Dr Ruel Favors office.   Special instructions" He can hold Plavix for 7 days before procedure. I prefer his surgery his done on ASA. But if it can not be done on ASA,   Ok to hold ASA for 5 days before procedure.   Renold Don and will fax to her at  [phone]

## 2014-02-04 LAB — URINE CULTURE: Colony Count: >=100000

## 2014-02-07 ENCOUNTER — Encounter (HOSPITAL_COMMUNITY): Payer: Self-pay

## 2014-02-07 NOTE — Progress Notes (Signed)
Anesthesia Chart Review: Patient is a 81 year old male scheduled for left TKA on 02/14/14 by Dr. Ronnie Derby.  History includes CAD/inferior MI '02 s/p PCI, Taxus stent LAD '04, Cypher DES to pLAD and mCX stent '06, atrial bigeminy, non-smoker, GERD, IBS, fatty liver, dyslipidemia, BPH, THN, carotid artery stenosis, DM2, arthritis, prostate cancer, right TKA 05/2013. BMI is consistent with obesity. PCP is Dr. Osborne Casco. Cardiologist is Dr. [name] who cleared patient from a cardiac standpoint with permission to hold Plavix for 7 days preoperatively.  If the surgeon did not feel procedure could be done with ASA, but patient could hold ASA for 5 days preoperatively.  Meds include amlodipine, ASA, Plavix, Nitro, Prilosec, ramipril, Kombiglyze XR, Zocor, Flomax.  Myoview 09/12/09: Inferior thinning with very mild ischemia in the inferior wall and apex, EF 62%. This lead to a cardiac cath (see below).  LHC 09/29/09: EF 60%, proximal RCA stent patent with 30-40% ISR, mid RCA 30-40%, 40% prior to bifurcation into PLV and PDA, ostial ramus 30%, circumflex 30%, mid to distal circumflex stents patent with minimal ISR, proximal LAD stent patent with mild ISR, proximal LAD before the stent 50-60% (not flow-limiting). Medical therapy was recommended.  Carotid duplex 04/30/13: Mild heterogeneous plaque bilaterally. Stable 40-59% BICA stenosis. Antegrade vertebral artery flow. Normal SCA flow, bilaterally.  04/23/13 EKG: NSR, low voltage QRS.  05/14/13 CXR: No active cardiopulmonary disease.  Preoperative labs noted. No T&S was ordered. Urine culture grew > 100,000 colonies of E. Coli. Results routed to Dr. Ronnie Derby.  I also left a voice message with Marianna Fuss at Dr. Ruel Favors office.  Defer treatment recommendations to Dr. Ronnie Derby or his physician assistant.  George Hugh [facility] Short Stay Center/Anesthesiology Phone [phone] 02/07/2014 10:56 AM

## 2014-02-08 NOTE — Progress Notes (Signed)
Patient called to ask if it was okay for him to take the medication prescribed by his dentist for his dental appointment tomorrow. Pt stated he knew he wasn't suppose to take plavix or aspirin.    I instructed him that the only medications he should not take this far in advance are blood thinners, like the plavix and aspirin, and antibiotics, sometimes prescribed by dentist, are fine to take. Pt voiced understanding.

## 2014-02-13 MED ORDER — TRANEXAMIC ACID 100 MG/ML IV SOLN
2000.0000 mg | INTRAVENOUS | Status: DC
  Filled 2014-02-13: qty 20

## 2014-02-13 MED ORDER — CEFAZOLIN SODIUM-DEXTROSE 2-3 GM-% IV SOLR
2.0000 g | INTRAVENOUS | Status: AC
  Administered 2014-02-14: 2 g via INTRAVENOUS
  Filled 2014-02-13: qty 50

## 2014-02-14 ENCOUNTER — Inpatient Hospital Stay (HOSPITAL_COMMUNITY): Payer: Medicare Other | Admitting: Vascular Surgery

## 2014-02-14 ENCOUNTER — Encounter (HOSPITAL_COMMUNITY)
Admission: RE | Disposition: A | Discharge status: Home Health | Payer: Self-pay | Source: Ambulatory Visit | Attending: Orthopedic Surgery

## 2014-02-14 ENCOUNTER — Encounter (HOSPITAL_COMMUNITY): Payer: Self-pay | Admitting: *Deleted

## 2014-02-14 ENCOUNTER — Inpatient Hospital Stay (HOSPITAL_COMMUNITY)
Admission: RE | Admit: 2014-02-14 | Discharge: 2014-02-15 | DRG: 470 | Disposition: A | Discharge status: Home Health | Payer: Medicare Other | Source: Ambulatory Visit | Attending: Orthopedic Surgery | Admitting: Orthopedic Surgery

## 2014-02-14 ENCOUNTER — Inpatient Hospital Stay (HOSPITAL_COMMUNITY): Payer: Medicare Other | Admitting: Certified Registered Nurse Anesthetist

## 2014-02-14 DIAGNOSIS — Z7982 Long term (current) use of aspirin: Secondary | ICD-10-CM

## 2014-02-14 DIAGNOSIS — N400 Enlarged prostate without lower urinary tract symptoms: Secondary | ICD-10-CM | POA: Diagnosis present

## 2014-02-14 DIAGNOSIS — Z8249 Family history of ischemic heart disease and other diseases of the circulatory system: Secondary | ICD-10-CM

## 2014-02-14 DIAGNOSIS — Z79899 Other long term (current) drug therapy: Secondary | ICD-10-CM | POA: Diagnosis not present

## 2014-02-14 DIAGNOSIS — E119 Type 2 diabetes mellitus without complications: Secondary | ICD-10-CM | POA: Diagnosis present

## 2014-02-14 DIAGNOSIS — Z955 Presence of coronary angioplasty implant and graft: Secondary | ICD-10-CM

## 2014-02-14 DIAGNOSIS — I10 Essential (primary) hypertension: Secondary | ICD-10-CM | POA: Diagnosis present

## 2014-02-14 DIAGNOSIS — M179 Osteoarthritis of knee, unspecified: Principal | ICD-10-CM | POA: Diagnosis present

## 2014-02-14 DIAGNOSIS — E785 Hyperlipidemia, unspecified: Secondary | ICD-10-CM | POA: Diagnosis present

## 2014-02-14 DIAGNOSIS — Z7902 Long term (current) use of antithrombotics/antiplatelets: Secondary | ICD-10-CM | POA: Diagnosis not present

## 2014-02-14 DIAGNOSIS — D62 Acute posthemorrhagic anemia: Secondary | ICD-10-CM | POA: Diagnosis not present

## 2014-02-14 DIAGNOSIS — Z8546 Personal history of malignant neoplasm of prostate: Secondary | ICD-10-CM

## 2014-02-14 DIAGNOSIS — M25562 Pain in left knee: Secondary | ICD-10-CM | POA: Diagnosis present

## 2014-02-14 DIAGNOSIS — I252 Old myocardial infarction: Secondary | ICD-10-CM

## 2014-02-14 DIAGNOSIS — Z96659 Presence of unspecified artificial knee joint: Secondary | ICD-10-CM

## 2014-02-14 DIAGNOSIS — K219 Gastro-esophageal reflux disease without esophagitis: Secondary | ICD-10-CM | POA: Diagnosis present

## 2014-02-14 DIAGNOSIS — K589 Irritable bowel syndrome without diarrhea: Secondary | ICD-10-CM | POA: Diagnosis present

## 2014-02-14 DIAGNOSIS — I2510 Atherosclerotic heart disease of native coronary artery without angina pectoris: Secondary | ICD-10-CM | POA: Diagnosis present

## 2014-02-14 DIAGNOSIS — Z96651 Presence of right artificial knee joint: Secondary | ICD-10-CM | POA: Diagnosis present

## 2014-02-14 HISTORY — PX: TOTAL KNEE ARTHROPLASTY: SHX125

## 2014-02-14 LAB — GLUCOSE, CAPILLARY
Glucose-Capillary: 119 mg/dL — ABNORMAL HIGH (ref 70–99)
Glucose-Capillary: 123 mg/dL — ABNORMAL HIGH (ref 70–99)
Glucose-Capillary: 174 mg/dL — ABNORMAL HIGH (ref 70–99)
Glucose-Capillary: 161 mg/dL — ABNORMAL HIGH (ref 70–99)

## 2014-02-14 SURGERY — ARTHROPLASTY, KNEE, TOTAL
Anesthesia: General | Site: Knee | Laterality: Left

## 2014-02-14 MED ORDER — OXYCODONE HCL 5 MG PO TABS
5.0000 mg | ORAL_TABLET | ORAL | Status: DC | PRN
  Administered 2014-02-14 – 2014-02-15 (×3): 10 mg via ORAL
  Filled 2014-02-14 (×3): qty 2

## 2014-02-14 MED ORDER — BUPIVACAINE LIPOSOME 1.3 % IJ SUSP
20.0000 mL | Freq: Once | INTRAMUSCULAR | Status: AC
  Administered 2014-02-14: 20 mL
  Filled 2014-02-14: qty 20

## 2014-02-14 MED ORDER — ACETAMINOPHEN 325 MG PO TABS: 650.0000 mg | ORAL_TABLET | Freq: Four times a day (QID) | ORAL | Status: DC | PRN

## 2014-02-14 MED ORDER — METFORMIN HCL 500 MG PO TABS
1000.0000 mg | ORAL_TABLET | Freq: Two times a day (BID) | ORAL | Status: DC
  Administered 2014-02-14 – 2014-02-15 (×2): 1000 mg via ORAL
  Filled 2014-02-14 (×4): qty 2

## 2014-02-14 MED ORDER — SODIUM CHLORIDE 0.9 % IR SOLN
Status: DC | PRN
  Administered 2014-02-14: 1000 mL

## 2014-02-14 MED ORDER — BISACODYL 5 MG PO TBEC: 5.0000 mg | DELAYED_RELEASE_TABLET | Freq: Every day | ORAL | Status: DC | PRN

## 2014-02-14 MED ORDER — CHLORHEXIDINE GLUCONATE 4 % EX LIQD
60.0000 mL | Freq: Once | CUTANEOUS | Status: DC
  Filled 2014-02-14: qty 60

## 2014-02-14 MED ORDER — METHOCARBAMOL 500 MG PO TABS
500.0000 mg | ORAL_TABLET | Freq: Four times a day (QID) | ORAL | Status: DC | PRN
  Administered 2014-02-14 – 2014-02-15 (×2): 500 mg via ORAL
  Filled 2014-02-14 (×3): qty 1

## 2014-02-14 MED ORDER — PHENOL 1.4 % MT LIQD: 1.0000 | OROMUCOSAL | Status: DC | PRN

## 2014-02-14 MED ORDER — CELECOXIB 200 MG PO CAPS
200.0000 mg | ORAL_CAPSULE | Freq: Two times a day (BID) | ORAL | Status: DC
  Administered 2014-02-14 – 2014-02-15 (×2): 200 mg via ORAL
  Filled 2014-02-14 (×3): qty 1

## 2014-02-14 MED ORDER — OXYCODONE HCL ER 10 MG PO T12A
10.0000 mg | EXTENDED_RELEASE_TABLET | Freq: Two times a day (BID) | ORAL | Status: DC
Start: 2014-02-14 — End: 2014-02-15
  Administered 2014-02-14 – 2014-02-15 (×2): 10 mg via ORAL
  Filled 2014-02-14 (×2): qty 1

## 2014-02-14 MED ORDER — FENTANYL CITRATE 0.05 MG/ML IJ SOLN
50.0000 ug | Freq: Once | INTRAMUSCULAR | Status: AC
  Administered 2014-02-14: 50 ug via INTRAVENOUS

## 2014-02-14 MED ORDER — HYDROMORPHONE HCL 1 MG/ML IJ SOLN
1.0000 mg | INTRAMUSCULAR | Status: DC | PRN
  Administered 2014-02-14 (×2): 1 mg via INTRAVENOUS
  Filled 2014-02-14 (×2): qty 1

## 2014-02-14 MED ORDER — ONDANSETRON HCL 4 MG/2ML IJ SOLN: 4.0000 mg | Freq: Four times a day (QID) | INTRAMUSCULAR | Status: DC | PRN

## 2014-02-14 MED ORDER — MIDAZOLAM HCL 2 MG/2ML IJ SOLN
2.0000 mg | Freq: Once | INTRAMUSCULAR | Status: AC
  Administered 2014-02-14: 2 mg via INTRAVENOUS

## 2014-02-14 MED ORDER — METOCLOPRAMIDE HCL 5 MG PO TABS
5.0000 mg | ORAL_TABLET | Freq: Three times a day (TID) | ORAL | Status: DC | PRN
  Filled 2014-02-14: qty 2

## 2014-02-14 MED ORDER — BUPIVACAINE-EPINEPHRINE 0.5% -1:200000 IJ SOLN
INTRAMUSCULAR | Status: DC | PRN
Start: 2014-02-14 — End: 2014-02-14
  Administered 2014-02-14: 30 mL

## 2014-02-14 MED ORDER — CLOPIDOGREL BISULFATE 75 MG PO TABS
75.0000 mg | ORAL_TABLET | Freq: Every day | ORAL | Status: DC
  Filled 2014-02-14 (×2): qty 1

## 2014-02-14 MED ORDER — SIMVASTATIN 20 MG PO TABS
20.0000 mg | ORAL_TABLET | Freq: Every day | ORAL | Status: DC
  Administered 2014-02-14: 20 mg via ORAL
  Filled 2014-02-14 (×2): qty 1

## 2014-02-14 MED ORDER — MENTHOL 3 MG MT LOZG: 1.0000 | LOZENGE | OROMUCOSAL | Status: DC | PRN

## 2014-02-14 MED ORDER — SODIUM CHLORIDE 0.9 % IV SOLN
INTRAVENOUS | Status: DC
  Administered 2014-02-14: 16:00:00 via INTRAVENOUS

## 2014-02-14 MED ORDER — CEFAZOLIN SODIUM-DEXTROSE 2-3 GM-% IV SOLR
2.0000 g | Freq: Four times a day (QID) | INTRAVENOUS | Status: AC
  Administered 2014-02-14 (×2): 2 g via INTRAVENOUS
  Filled 2014-02-14 (×2): qty 50

## 2014-02-14 MED ORDER — LACTATED RINGERS IV SOLN
INTRAVENOUS | Status: DC
  Administered 2014-02-14: 08:00:00 via INTRAVENOUS

## 2014-02-14 MED ORDER — DOCUSATE SODIUM 100 MG PO CAPS
100.0000 mg | ORAL_CAPSULE | Freq: Two times a day (BID) | ORAL | Status: DC
  Administered 2014-02-14: 100 mg via ORAL
  Filled 2014-02-14 (×3): qty 1

## 2014-02-14 MED ORDER — SODIUM CHLORIDE 0.9 % IV SOLN: INTRAVENOUS | Status: DC

## 2014-02-14 MED ORDER — CLOPIDOGREL BISULFATE 75 MG PO TABS
75.0000 mg | ORAL_TABLET | Freq: Every day | ORAL | Status: DC
  Administered 2014-02-15: 75 mg via ORAL
  Filled 2014-02-14 (×2): qty 1

## 2014-02-14 MED ORDER — ONDANSETRON HCL 4 MG/2ML IJ SOLN: 4.0000 mg | Freq: Once | INTRAMUSCULAR | Status: DC | PRN

## 2014-02-14 MED ORDER — PROPOFOL 10 MG/ML IV BOLUS
INTRAVENOUS | Status: DC | PRN
  Administered 2014-02-14: 40 mg via INTRAVENOUS
  Administered 2014-02-14: 100 mg via INTRAVENOUS
  Administered 2014-02-14: 160 mg via INTRAVENOUS

## 2014-02-14 MED ORDER — SAXAGLIPTIN-METFORMIN ER 5-1000 MG PO TB24: 1.0000 | ORAL_TABLET | Freq: Two times a day (BID) | ORAL | Status: DC

## 2014-02-14 MED ORDER — ONDANSETRON HCL 4 MG PO TABS: 4.0000 mg | ORAL_TABLET | Freq: Four times a day (QID) | ORAL | Status: DC | PRN

## 2014-02-14 MED ORDER — SENNOSIDES-DOCUSATE SODIUM 8.6-50 MG PO TABS: 1.0000 | ORAL_TABLET | Freq: Every evening | ORAL | Status: DC | PRN

## 2014-02-14 MED ORDER — ALUM & MAG HYDROXIDE-SIMETH 200-200-20 MG/5ML PO SUSP: 30.0000 mL | ORAL | Status: DC | PRN

## 2014-02-14 MED ORDER — SUCCINYLCHOLINE CHLORIDE 20 MG/ML IJ SOLN
INTRAMUSCULAR | Status: DC | PRN
  Administered 2014-02-14: 100 mg via INTRAVENOUS

## 2014-02-14 MED ORDER — TAMSULOSIN HCL 0.4 MG PO CAPS
0.4000 mg | ORAL_CAPSULE | Freq: Every day | ORAL | Status: DC
  Administered 2014-02-14: 0.4 mg via ORAL
  Filled 2014-02-14 (×2): qty 1

## 2014-02-14 MED ORDER — PHENYLEPHRINE 40 MCG/ML (10ML) SYRINGE FOR IV PUSH (FOR BLOOD PRESSURE SUPPORT)
PREFILLED_SYRINGE | INTRAVENOUS | Status: AC
  Filled 2014-02-14: qty 10

## 2014-02-14 MED ORDER — PANTOPRAZOLE SODIUM 40 MG PO TBEC
40.0000 mg | DELAYED_RELEASE_TABLET | Freq: Every day | ORAL | Status: DC
  Administered 2014-02-15: 40 mg via ORAL
  Filled 2014-02-14: qty 1

## 2014-02-14 MED ORDER — MIDAZOLAM HCL 2 MG/2ML IJ SOLN
INTRAMUSCULAR | Status: AC
  Filled 2014-02-14: qty 2

## 2014-02-14 MED ORDER — EPHEDRINE SULFATE 50 MG/ML IJ SOLN
INTRAMUSCULAR | Status: DC | PRN
  Administered 2014-02-14: 10 mg via INTRAVENOUS

## 2014-02-14 MED ORDER — FENTANYL CITRATE 0.05 MG/ML IJ SOLN
INTRAMUSCULAR | Status: AC
  Filled 2014-02-14: qty 5

## 2014-02-14 MED ORDER — ACETAMINOPHEN 650 MG RE SUPP: 650.0000 mg | Freq: Four times a day (QID) | RECTAL | Status: DC | PRN

## 2014-02-14 MED ORDER — HYDROMORPHONE HCL 1 MG/ML IJ SOLN
0.2500 mg | INTRAMUSCULAR | Status: DC | PRN
  Administered 2014-02-14: 0.5 mg via INTRAVENOUS
  Administered 2014-02-14 (×2): 0.25 mg via INTRAVENOUS

## 2014-02-14 MED ORDER — FENTANYL CITRATE 0.05 MG/ML IJ SOLN
INTRAMUSCULAR | Status: DC | PRN
  Administered 2014-02-14: 50 ug via INTRAVENOUS
  Administered 2014-02-14: 25 ug via INTRAVENOUS
  Administered 2014-02-14: 50 ug via INTRAVENOUS
  Administered 2014-02-14: 25 ug via INTRAVENOUS

## 2014-02-14 MED ORDER — RAMIPRIL 10 MG PO CAPS
10.0000 mg | ORAL_CAPSULE | Freq: Every day | ORAL | Status: DC
  Administered 2014-02-14 – 2014-02-15 (×2): 10 mg via ORAL
  Filled 2014-02-14 (×3): qty 1

## 2014-02-14 MED ORDER — METHOCARBAMOL 1000 MG/10ML IJ SOLN
500.0000 mg | INTRAVENOUS | Status: AC
  Administered 2014-02-14: 500 mg via INTRAVENOUS
  Filled 2014-02-14: qty 5

## 2014-02-14 MED ORDER — LINAGLIPTIN 5 MG PO TABS
5.0000 mg | ORAL_TABLET | Freq: Two times a day (BID) | ORAL | Status: DC
  Administered 2014-02-14 – 2014-02-15 (×2): 5 mg via ORAL
  Filled 2014-02-14 (×4): qty 1

## 2014-02-14 MED ORDER — TRANEXAMIC ACID 100 MG/ML IV SOLN
2000.0000 mg | INTRAVENOUS | Status: DC | PRN
  Administered 2014-02-14: 2000 mg via INTRAVENOUS

## 2014-02-14 MED ORDER — FENTANYL CITRATE 0.05 MG/ML IJ SOLN
INTRAMUSCULAR | Status: AC
  Filled 2014-02-14: qty 2

## 2014-02-14 MED ORDER — LIDOCAINE HCL (CARDIAC) 20 MG/ML IV SOLN
INTRAVENOUS | Status: DC | PRN
  Administered 2014-02-14: 100 mg via INTRAVENOUS

## 2014-02-14 MED ORDER — DIPHENHYDRAMINE HCL 12.5 MG/5ML PO ELIX: 12.5000 mg | ORAL_SOLUTION | ORAL | Status: DC | PRN

## 2014-02-14 MED ORDER — METOCLOPRAMIDE HCL 5 MG/ML IJ SOLN
5.0000 mg | Freq: Three times a day (TID) | INTRAMUSCULAR | Status: DC | PRN
Start: 2014-02-14 — End: 2014-02-15

## 2014-02-14 MED ORDER — PHENYLEPHRINE HCL 10 MG/ML IJ SOLN
INTRAMUSCULAR | Status: DC | PRN
  Administered 2014-02-14 (×3): 80 ug via INTRAVENOUS
  Administered 2014-02-14: 40 ug via INTRAVENOUS
  Administered 2014-02-14: 80 ug via INTRAVENOUS
  Administered 2014-02-14: 40 ug via INTRAVENOUS

## 2014-02-14 MED ORDER — PROPOFOL 10 MG/ML IV BOLUS
INTRAVENOUS | Status: AC
  Filled 2014-02-14: qty 20

## 2014-02-14 MED ORDER — LACTATED RINGERS IV SOLN
INTRAVENOUS | Status: DC | PRN
  Administered 2014-02-14 (×2): via INTRAVENOUS

## 2014-02-14 MED ORDER — MEPERIDINE HCL 25 MG/ML IJ SOLN: 6.2500 mg | INTRAMUSCULAR | Status: DC | PRN

## 2014-02-14 MED ORDER — INSULIN ASPART 100 UNIT/ML ~~LOC~~ SOLN
0.0000 [IU] | Freq: Three times a day (TID) | SUBCUTANEOUS | Status: DC
Start: 2014-02-14 — End: 2014-02-15
  Administered 2014-02-14 – 2014-02-15 (×2): 3 [IU] via SUBCUTANEOUS
  Administered 2014-02-15: 2 [IU] via SUBCUTANEOUS

## 2014-02-14 MED ORDER — AMLODIPINE BESYLATE 5 MG PO TABS
5.0000 mg | ORAL_TABLET | Freq: Every day | ORAL | Status: DC
  Administered 2014-02-14: 5 mg via ORAL
  Filled 2014-02-14 (×2): qty 1

## 2014-02-14 MED ORDER — ONDANSETRON HCL 4 MG/2ML IJ SOLN
INTRAMUSCULAR | Status: DC | PRN
  Administered 2014-02-14: 4 mg via INTRAVENOUS

## 2014-02-14 MED ORDER — FLEET ENEMA 7-19 GM/118ML RE ENEM: 1.0000 | ENEMA | Freq: Once | RECTAL | Status: AC | PRN

## 2014-02-14 MED ORDER — METHOCARBAMOL 1000 MG/10ML IJ SOLN
500.0000 mg | Freq: Four times a day (QID) | INTRAVENOUS | Status: DC | PRN
  Filled 2014-02-14: qty 5

## 2014-02-14 MED ORDER — NITROGLYCERIN 0.4 MG SL SUBL: 0.4000 mg | SUBLINGUAL_TABLET | SUBLINGUAL | Status: DC | PRN

## 2014-02-14 MED ORDER — ONDANSETRON HCL 4 MG/2ML IJ SOLN
INTRAMUSCULAR | Status: AC
  Filled 2014-02-14: qty 2

## 2014-02-14 MED ORDER — HYDROMORPHONE HCL 1 MG/ML IJ SOLN
INTRAMUSCULAR | Status: AC
  Filled 2014-02-14: qty 1

## 2014-02-14 MED ORDER — LIDOCAINE HCL (CARDIAC) 20 MG/ML IV SOLN
INTRAVENOUS | Status: AC
  Filled 2014-02-14: qty 5

## 2014-02-14 MED ORDER — ZOLPIDEM TARTRATE 5 MG PO TABS: 5.0000 mg | ORAL_TABLET | Freq: Every evening | ORAL | Status: DC | PRN

## 2014-02-14 SURGICAL SUPPLY — 55 items
BANDAGE ESMARK 6X9 LF (GAUZE/BANDAGES/DRESSINGS) ×1 IMPLANT
BLADE SAGITTAL 13X1.27X60 (BLADE) ×2 IMPLANT
BLADE SAW SGTL 83.5X18.5 (BLADE) ×2 IMPLANT
BLADE SURG 10 STRL SS (BLADE) ×2 IMPLANT
BNDG ESMARK 6X9 LF (GAUZE/BANDAGES/DRESSINGS) ×2
BOWL SMART MIX CTS (DISPOSABLE) ×2 IMPLANT
CAPT KNEE TOTAL TK-3 ×2 IMPLANT
CEMENT BONE SIMPLEX SPEEDSET (Cement) ×4 IMPLANT
COVER SURGICAL LIGHT HANDLE (MISCELLANEOUS) ×2 IMPLANT
CUFF TOURNIQUET SINGLE 34IN LL (TOURNIQUET CUFF) ×2 IMPLANT
DRAPE EXTREMITY T 121X128X90 (DRAPE) ×2 IMPLANT
DRAPE IMP U-DRAPE 54X76 (DRAPES) ×2 IMPLANT
DRAPE INCISE IOBAN 66X45 STRL (DRAPES) ×4 IMPLANT
DRAPE PROXIMA HALF (DRAPES) IMPLANT
DRAPE U-SHAPE 47X51 STRL (DRAPES) ×2 IMPLANT
DRSG ADAPTIC 3X8 NADH LF (GAUZE/BANDAGES/DRESSINGS) ×2 IMPLANT
DRSG PAD ABDOMINAL 8X10 ST (GAUZE/BANDAGES/DRESSINGS) ×2 IMPLANT
DURAPREP 26ML APPLICATOR (WOUND CARE) ×4 IMPLANT
ELECT REM PT RETURN 9FT ADLT (ELECTROSURGICAL) ×2
ELECTRODE REM PT RTRN 9FT ADLT (ELECTROSURGICAL) ×1 IMPLANT
GAUZE SPONGE 4X4 12PLY STRL (GAUZE/BANDAGES/DRESSINGS) ×2 IMPLANT
GLOVE BIOGEL M 7.0 STRL (GLOVE) IMPLANT
GLOVE BIOGEL PI IND STRL 8 (GLOVE) ×1 IMPLANT
GLOVE BIOGEL PI IND STRL 8.5 (GLOVE) ×2 IMPLANT
GLOVE BIOGEL PI INDICATOR 8 (GLOVE) ×1
GLOVE BIOGEL PI INDICATOR 8.5 (GLOVE) ×2
GLOVE SURG ORTHO 8.0 STRL STRW (GLOVE) ×4 IMPLANT
GLOVE SURG SS PI 8.0 STRL IVOR (GLOVE) ×2 IMPLANT
GOWN STRL REUS W/ TWL XL LVL3 (GOWN DISPOSABLE) ×2 IMPLANT
GOWN STRL REUS W/TWL XL LVL3 (GOWN DISPOSABLE) ×2
HANDPIECE INTERPULSE COAX TIP (DISPOSABLE) ×1
HOOD PEEL AWAY FACE SHEILD DIS (HOOD) ×6 IMPLANT
KIT BASIN OR (CUSTOM PROCEDURE TRAY) ×2 IMPLANT
KIT ROOM TURNOVER OR (KITS) ×2 IMPLANT
MANIFOLD NEPTUNE II (INSTRUMENTS) ×2 IMPLANT
NEEDLE 18GX1X1/2 (RX/OR ONLY) (NEEDLE) ×2 IMPLANT
NEEDLE 22X1 1/2 (OR ONLY) (NEEDLE) ×4 IMPLANT
NS IRRIG 1000ML POUR BTL (IV SOLUTION) ×2 IMPLANT
PACK TOTAL JOINT (CUSTOM PROCEDURE TRAY) ×2 IMPLANT
PACK UNIVERSAL I (CUSTOM PROCEDURE TRAY) ×2 IMPLANT
PAD ABD 8X10 STRL (GAUZE/BANDAGES/DRESSINGS) ×2 IMPLANT
PADDING CAST COTTON 6X4 STRL (CAST SUPPLIES) ×2 IMPLANT
SET HNDPC FAN SPRY TIP SCT (DISPOSABLE) ×1 IMPLANT
SPONGE GAUZE 4X4 12PLY STER LF (GAUZE/BANDAGES/DRESSINGS) ×2 IMPLANT
STAPLER VISISTAT 35W (STAPLE) ×2 IMPLANT
SUT BONE WAX W31G (SUTURE) ×2 IMPLANT
SUT VIC AB 0 CTB1 27 (SUTURE) ×4 IMPLANT
SUT VIC AB 1 CT1 27 (SUTURE) ×2
SUT VIC AB 1 CT1 27XBRD ANBCTR (SUTURE) ×2 IMPLANT
SUT VIC AB 2-0 CT1 27 (SUTURE) ×2
SUT VIC AB 2-0 CT1 TAPERPNT 27 (SUTURE) ×2 IMPLANT
SYR CONTROL 10ML LL (SYRINGE) ×4 IMPLANT
TOWEL OR 17X24 6PK STRL BLUE (TOWEL DISPOSABLE) ×2 IMPLANT
TOWEL OR 17X26 10 PK STRL BLUE (TOWEL DISPOSABLE) ×2 IMPLANT
WATER STERILE IRR 1000ML POUR (IV SOLUTION) ×4 IMPLANT

## 2014-02-14 NOTE — H&P (Signed)
[name] Ray MRN:  [id number] DOB/SEX:  Jun 12, 1933/male  CHIEF COMPLAINT:  Painful left Knee  HISTORY: Patient is a 81 y.o. male presented with a history of pain in the left knee. Onset of symptoms was gradual starting several years ago with gradually worsening course since that time. Prior procedures on the knee include none. Patient has been treated conservatively with over-the-counter NSAIDs and activity modification. Patient currently rates pain in the knee at 10 out of 10 with activity. There is pain at night.  PAST MEDICAL HISTORY: Patient Active Problem List   Diagnosis Date Noted  . S/P total knee arthroplasty 05/24/2013  . Carotid artery disease   . Coronary artery disease   . Dyslipidemia   . GERD (gastroesophageal reflux disease)   . Hypertension   . Numbness and tingling in hands   . FECAL OCCULT BLOOD 10/19/2009  . COLONIC POLYPS, ADENOMATOUS, HX OF 10/19/2009  . OVERWEIGHT 11/08/2008  . DM 10/25/2008  . GERD 10/25/2008   Past Medical History  Diagnosis Date  . GERD (gastroesophageal reflux disease)   . IBS (irritable bowel syndrome)   . Adenomatous polyp     history  . Diverticulosis   . Fatty liver     history  . Dyslipidemia   . Atrial bigeminy     in the past  . Numbness and tingling in hands     resolved  . BPH (benign prostatic hyperplasia)   . Colon polyps   . Overweight(278.02)   . Hypertension     [facility] - 5 stents total , last cardiac cath. 2011.  Cleared for  surgery by Dr. [name]- 04/23/2013  . Carotid artery disease     a. Doppler, January, 2012, 40-59% bilateral stenoses;  b.  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 Myocardial infarction 2002  . Type II diabetes mellitus   . Arthritis     knees , shoulders, hands   . Prostate cancer     treated /w  Lupron injection x1, then radium  . Shortness of breath dyspnea   . Coronary artery disease     acute inferior MI Jan 2002 with stent / Taxus  stent LAD 2004 / Cypher stent prox LAD and cypher stent mid circ May 2006 / nuclear stress Dec 2009 normal EF 63% / nuclear August 2011 EF 62% / inferior thining and very mild ischemia inferior and apex / catherization Sept 2011 EF 60%  mild to moderate diffuse RCA disease, moderate discrete LAD stenosis before the stent in the proximal LAD med tx rec   Past Surgical History  Procedure Laterality Date  . Neuroplasty / transposition median nerve at carpal tunnel Left     also surgery on thumb, elbow- nerve release   . Prostate biopsy      followed later by [facility]DR  . Total knee arthroplasty Right 05/24/2013  . Total knee arthroplasty Right 05/24/2013    Procedure: RIGHT TOTAL KNEE ARTHROPLASTY;  Surgeon: [name], MD;  Location: [place];  Service: Orthopedics;  Laterality: Right;  . Cardiac catheterization      last cath- 2006  . Coronary angioplasty with stent placement      multiple stents in LAD:  "I've got 5 stents" (05/24/2013)     MEDICATIONS:   No prescriptions prior to admission    ALLERGIES:   Allergies  Allergen Reactions  . Atorvastatin Other (See Comments)    Sever leg pain  . Rosuvastatin  REACTION: unknown  . Clindamycin Rash    Patient states may cause a rash. Did not want to take medicine     REVIEW OF SYSTEMS:  A comprehensive review of systems was negative.   FAMILY HISTORY:   Family History  Problem Relation Age of Onset  . Heart attack Paternal Grandfather     SOCIAL HISTORY:   History  Substance Use Topics  . Smoking status: Never Smoker   . Smokeless tobacco: Never Used  . Alcohol Use: No     EXAMINATION:  Vital signs in last 24 hours:    General appearance: alert, cooperative and no distress Lungs: clear to auscultation bilaterally Heart: regular rate and rhythm, S1, S2 normal, no murmur, click, rub or gallop Abdomen: soft, non-tender; bowel sounds normal; no masses,  no organomegaly Extremities: extremities normal, atraumatic, no cyanosis  or edema and Homans sign is negative, no sign of DVT Pulses: 2+ and symmetric Skin: Skin color, texture, turgor normal. No rashes or lesions Neurologic: Alert and oriented X 3, normal strength and tone. Normal symmetric reflexes. Normal coordination and gait  Musculoskeletal:  ROM 0-115, Ligaments intact,  Imaging Review Plain radiographs demonstrate severe degenerative joint disease of the left knee. The overall alignment is significant varus. The bone quality appears to be good for age and reported activity level.  Assessment/Plan: Primary osteoarthritis, left knee   The patient history, physical examination and imaging studies are consistent with advanced degenerative joint disease of the left knee. The patient has failed conservative treatment.  The clearance notes were reviewed.  After discussion with the patient it was felt that Total Knee Replacement was indicated. The procedure,  risks, and benefits of total knee arthroplasty were presented and reviewed. The risks including but not limited to aseptic loosening, infection, blood clots, vascular injury, stiffness, patella tracking problems complications among others were discussed. The patient acknowledged the explanation, agreed to proceed with the plan.  Klein Willcox 02/14/2014, 6:52 AM

## 2014-02-14 NOTE — Progress Notes (Signed)
Orthopedic Tech Progress Note Patient Details:  [name] Ray 1933-08-06 [id number]  CPM Left Knee CPM Left Knee: On Left Knee Flexion (Degrees): 90 Left Knee Extension (Degrees): 0 Additional Comments: trapeze bar patient helper Viewed order from doctor's order list  Hildred Priest 02/14/2014, 1:44 PM

## 2014-02-14 NOTE — Anesthesia Postprocedure Evaluation (Signed)
Anesthesia Post Note  Patient: [name] Ray  Procedure(s) Performed: Procedure(s) (LRB): LEFT TOTAL KNEE ARTHROPLASTY (Left)  Anesthesia type: general  Patient location: PACU  Post pain: Pain level controlled  Post assessment: Patient's Cardiovascular Status Stable  Last Vitals:  Filed Vitals:   02/14/14 1341  BP:   Pulse:   Temp: 36.5 C  Resp:     Post vital signs: Reviewed and stable  Level of consciousness: sedated  Complications: No apparent anesthesia complications

## 2014-02-14 NOTE — Anesthesia Procedure Notes (Addendum)
Procedure Name: LMA Insertion Date/Time: 02/14/2014 10:18 AM Performed by: Garrison Columbus T Pre-anesthesia Checklist: Patient identified, Emergency Drugs available, Suction available and Patient being monitored Patient Re-evaluated:Patient Re-evaluated prior to inductionOxygen Delivery Method: Circle system utilized Preoxygenation: Pre-oxygenation with 100% oxygen Intubation Type: IV induction LMA: LMA inserted LMA Size: 5.0 Number of attempts: 1 Placement Confirmation: positive ETCO2 and breath sounds checked- equal and bilateral Tube secured with: Tape Dental Injury: Teeth and Oropharynx as per pre-operative assessment     Procedure Name: Intubation Date/Time: 02/14/2014 10:56 AM Performed by: Garrison Columbus T Pre-anesthesia Checklist: Patient identified, Emergency Drugs available, Suction available and Patient being monitored Patient Re-evaluated:Patient Re-evaluated prior to inductionOxygen Delivery Method: Circle system utilized Preoxygenation: Pre-oxygenation with 100% oxygen Intubation Type: IV induction Laryngoscope Size: Miller and 2 Grade View: Grade I Tube type: Oral Tube size: 7.5 mm Number of attempts: 1 Airway Equipment and Method: Stylet Placement Confirmation: ETT inserted through vocal cords under direct vision,  positive ETCO2 and breath sounds checked- equal and bilateral Secured at: 22 cm Tube secured with: Tape Dental Injury: Teeth and Oropharynx as per pre-operative assessment

## 2014-02-14 NOTE — Transfer of Care (Signed)
Immediate Anesthesia Transfer of Care Note  Patient: [name] Ray  Procedure(s) Performed: Procedure(s): LEFT TOTAL KNEE ARTHROPLASTY (Left)  Patient Location: PACU  Anesthesia Type:General and Regional  Level of Consciousness: awake, alert  and oriented  Airway & Oxygen Therapy: Patient Spontanous Breathing and Patient connected to nasal cannula oxygen  Post-op Assessment: Report given to RN, Post -op Vital signs reviewed and stable and Patient moving all extremities X 4  Post vital signs: Reviewed and stable  Last Vitals:  Filed Vitals:   02/14/14 0855  BP: 123/44  Pulse: 80  Resp: 14    Complications: No apparent anesthesia complications

## 2014-02-14 NOTE — Progress Notes (Signed)
Utilization review completed.  

## 2014-02-14 NOTE — Evaluation (Signed)
Physical Therapy Evaluation Patient Details Name: [name] Ray MRN: [id number] DOB: Jun 14, 1933 Today's Date: 02/14/2014   History of Present Illness  Pt is a 72 male admitted 2/1 for elective L TKA. Pt had R TKA 05/2013.  Clinical Impression  Pt is s/p TKA resulting in the deficits listed below (see PT Problem List). Pt tolerated OOB well for POD #0.  Pt will benefit from skilled PT to increase their independence and safety with mobility to allow discharge to the venue listed below.      Follow Up Recommendations Home health PT;Supervision/Assistance - 24 hour    Equipment Recommendations  None recommended by PT    Recommendations for Other Services       Precautions / Restrictions Precautions Precautions: Fall Precaution Comments: educated on zero foam Restrictions Weight Bearing Restrictions: Yes LLE Weight Bearing: Weight bearing as tolerated      Mobility  Bed Mobility Overal bed mobility: Needs Assistance Bed Mobility: Supine to Sit     Supine to sit: Min assist     General bed mobility comments: v/c's for technique  Transfers Overall transfer level: Needs assistance Equipment used: Rolling walker (2 wheeled) Transfers: Sit to/from Stand Sit to Stand: Min assist         General transfer comment: v/c's for hand placement  Ambulation/Gait Ambulation/Gait assistance: Min assist Ambulation Distance (Feet): 5 Feet Assistive device: Rolling walker (2 wheeled) Gait Pattern/deviations: Step-to pattern;Decreased step length - left;Decreased stance time - left   Gait velocity interpretation: Below normal speed for age/gender General Gait Details: v/c's for sequencing. mild c/o of dizziness  Stairs            Wheelchair Mobility    Modified Rankin (Stroke Patients Only)       Balance                                             Pertinent Vitals/Pain Pain Assessment: 0-10 Pain Score: 5  Pain Location: lft knee Pain  Intervention(s): Monitored during session    Home Living Family/patient expects to be discharged to:: Private residence Living Arrangements: Spouse/significant other Available Help at Discharge: Family Type of Home: House Home Access: Stairs to enter Entrance Stairs-Rails: None (has a ramp) Entrance Stairs-Number of Steps: 1 Home Layout: One level Home Equipment: Walker - 2 wheels;Shower seat;Bedside commode;Adaptive equipment      Prior Function Level of Independence: Independent               Hand Dominance   Dominant Hand: Right    Extremity/Trunk Assessment   Upper Extremity Assessment: Overall WFL for tasks assessed           Lower Extremity Assessment: LLE deficits/detail   LLE Deficits / Details: pt able to complete quad set and achive 40 deg of active knee flexion  Cervical / Trunk Assessment: Normal  Communication   Communication: No difficulties  Cognition Arousal/Alertness: Awake/alert Behavior During Therapy: WFL for tasks assessed/performed Overall Cognitive Status: Within Functional Limits for tasks assessed                      General Comments      Exercises Total Joint Exercises Ankle Circles/Pumps: AROM;Both;5 reps Quad Sets: Left;5 reps;Supine Goniometric ROM: 10-42 knee flexion      Assessment/Plan    PT Assessment Patient needs continued PT services  PT Diagnosis  Difficulty walking;Acute pain   PT Problem List Decreased strength;Decreased range of motion;Decreased activity tolerance;Decreased balance;Decreased mobility  PT Treatment Interventions DME instruction;Gait training;Stair training;Functional mobility training;Therapeutic activities;Therapeutic exercise;Balance training   PT Goals (Current goals can be found in the Care Plan section) Acute Rehab PT Goals Patient Stated Goal: home PT Goal Formulation: With patient Time For Goal Achievement: 02/21/14 Potential to Achieve Goals: Good    Frequency 7X/week    Barriers to discharge        Co-evaluation               End of Session Equipment Utilized During Treatment: Gait belt Activity Tolerance: Patient tolerated treatment well Patient left: in chair;with call bell/phone within reach;with family/visitor present Nurse Communication: Mobility status         Time: 1517-6160 PT Time Calculation (min) (ACUTE ONLY): 17 min   Charges:   PT Evaluation $Initial PT Evaluation Tier I: 1 Procedure     PT G CodesKingsley Ray 02/14/2014, 4:55 PM   [name] Ray, PT, DPT Pager #: [phone] Office #: [phone]

## 2014-02-14 NOTE — Anesthesia Preprocedure Evaluation (Addendum)
Anesthesia Evaluation  Patient identified by MRN, date of birth, ID band Patient awake    Reviewed: Allergy & Precautions, NPO status , Patient's Chart, lab work & pertinent test results  Airway Mallampati: I  TM Distance: >3 FB Neck ROM: Full    Dental  (+) Dental Advisory Given   Pulmonary shortness of breath and with exertion,          Cardiovascular hypertension, Pt. on medications + CAD, + Past MI, + Cardiac Stents and + Peripheral Vascular Disease     Neuro/Psych    GI/Hepatic GERD-  Medicated,  Endo/Other  diabetes, Well Controlled, Type 2, Oral Hypoglycemic AgentsMorbid obesity  Renal/GU  Bladder dysfunction      Musculoskeletal  (+) Arthritis -,   Abdominal   Peds  Hematology   Anesthesia Other Findings   Reproductive/Obstetrics                            Anesthesia Physical Anesthesia Plan  ASA: III  Anesthesia Plan: General   Post-op Pain Management:    Induction: Intravenous  Airway Management Planned: LMA  Additional Equipment:   Intra-op Plan:   Post-operative Plan: Extubation in OR  Informed Consent: I have reviewed the patients History and Physical, chart, labs and discussed the procedure including the risks, benefits and alternatives for the proposed anesthesia with the patient or authorized representative who has indicated his/her understanding and acceptance.   Dental advisory given  Plan Discussed with: CRNA and Surgeon  Anesthesia Plan Comments:        Anesthesia Quick Evaluation

## 2014-02-15 ENCOUNTER — Encounter (HOSPITAL_COMMUNITY): Payer: Self-pay | Admitting: Orthopedic Surgery

## 2014-02-15 LAB — BASIC METABOLIC PANEL
Anion gap: 5 (ref 5–15)
BUN: 13 mg/dL (ref 6–23)
CO2: 27 mmol/L (ref 19–32)
Calcium: 7.8 mg/dL — ABNORMAL LOW (ref 8.4–10.5)
Chloride: 103 mmol/L (ref 96–112)
Creatinine, Ser: 1.13 mg/dL (ref 0.50–1.35)
GFR calc Af Amer: 70 mL/min — ABNORMAL LOW (ref >90)
GFR calc non Af Amer: 60 mL/min — ABNORMAL LOW (ref >90)
Glucose, Bld: 119 mg/dL — ABNORMAL HIGH (ref 70–99)
Potassium: 3.8 mmol/L (ref 3.5–5.1)
Sodium: 135 mmol/L (ref 135–145)

## 2014-02-15 LAB — CBC
HCT: 28.5 % — ABNORMAL LOW (ref 39.0–52.0)
Hemoglobin: 9.3 g/dL — ABNORMAL LOW (ref 13.0–17.0)
MCH: 28.5 pg (ref 26.0–34.0)
MCHC: 32.6 g/dL (ref 30.0–36.0)
MCV: 87.4 fL (ref 78.0–100.0)
Platelets: 166 10*3/uL (ref 150–400)
RBC: 3.26 MIL/uL — ABNORMAL LOW (ref 4.22–5.81)
RDW: 14.5 % (ref 11.5–15.5)
WBC: 7.3 10*3/uL (ref 4.0–10.5)

## 2014-02-15 LAB — GLUCOSE, CAPILLARY
Glucose-Capillary: 108 mg/dL — ABNORMAL HIGH (ref 70–99)
Glucose-Capillary: 181 mg/dL — ABNORMAL HIGH (ref 70–99)
Glucose-Capillary: 123 mg/dL — ABNORMAL HIGH (ref 70–99)

## 2014-02-15 MED ORDER — METHOCARBAMOL 500 MG PO TABS: 500.0000 mg | ORAL_TABLET | Freq: Four times a day (QID) | ORAL | Status: DC | PRN

## 2014-02-15 MED ORDER — OXYCODONE HCL ER 10 MG PO T12A: 10.0000 mg | EXTENDED_RELEASE_TABLET | Freq: Two times a day (BID) | ORAL | Status: DC

## 2014-02-15 MED ORDER — OXYCODONE HCL 5 MG PO TABS
5.0000 mg | ORAL_TABLET | ORAL | Status: DC | PRN
Start: 2014-02-15 — End: 2014-05-04

## 2014-02-15 NOTE — Progress Notes (Signed)
Physical Therapy Treatment Patient Details Name: [name] Ray MRN: [id number] DOB: 1933/09/17 Today's Date: 02/15/2014    History of Present Illness Pt is a 59 male admitted 2/1 for elective L TKA. Pt had R TKA 05/2013.    PT Comments    Pt with improved ambulation tolerance this date however with freq L knee instability due to onset of pain. Acute PT to see again this PM prior to d/c.  Follow Up Recommendations  Home health PT;Supervision/Assistance - 24 hour     Equipment Recommendations  None recommended by PT    Recommendations for Other Services       Precautions / Restrictions Precautions Precautions: Fall Precaution Comments: ed Restrictions Weight Bearing Restrictions: Yes LLE Weight Bearing: Weight bearing as tolerated    Mobility  Bed Mobility               General bed mobility comments: pt up in chair upon PT arrival  Transfers Overall transfer level: Needs assistance Equipment used: Rolling walker (2 wheeled) Transfers: Sit to/from Stand Sit to Stand: Min assist         General transfer comment: minA to maintain balance during transition of hands from chair to walker, increased time  Ambulation/Gait Ambulation/Gait assistance: Min assist Ambulation Distance (Feet): 200 Feet Assistive device: Rolling walker (2 wheeled) Gait Pattern/deviations: Step-to pattern;Step-through pattern;Decreased step length - left;Decreased stance time - left   Gait velocity interpretation: Below normal speed for age/gender General Gait Details: transitioned from step to, to step through gait pattern. Pt with freq episodes of L knee buckling due to sudden onset of pain however pt able to recover balance   Stairs Stairs: Yes Stairs assistance: Min guard Stair Management: Step to pattern;With walker Number of Stairs: 1 General stair comments: wife present, wife and pt with understanding of "up with the good, down with the bad"  Wheelchair Mobility    Modified  Rankin (Stroke Patients Only)       Balance Overall balance assessment: Needs assistance         Standing balance support: Bilateral upper extremity supported Standing balance-Leahy Scale: Poor Standing balance comment: needs RW for safe standing, pt able to pull underwear up with unilateral support                    Cognition Arousal/Alertness: Awake/alert Behavior During Therapy: WFL for tasks assessed/performed Overall Cognitive Status: Within Functional Limits for tasks assessed                      Exercises Total Joint Exercises Heel Slides: AROM;Left;10 reps;Seated Goniometric ROM: 6-82 in sitting    General Comments General comments (skin integrity, edema, etc.): pt c/o of R wrist pain during amb with c/o of it being asleep.      Pertinent Vitals/Pain Pain Assessment: 0-10 Pain Score: 6  Pain Location: Lt knee Pain Intervention(s): Monitored during session    Home Living Family/patient expects to be discharged to:: Private residence Living Arrangements: Spouse/significant other Available Help at Discharge: Family Type of Home: House Home Access: Stairs to enter Entrance Stairs-Rails: None Home Layout: One level Home Equipment: Environmental consultant - 2 wheels;Shower seat;Bedside commode;Adaptive equipment      Prior Function Level of Independence: Independent          PT Goals (current goals can now be found in the care plan section) Acute Rehab PT Goals Patient Stated Goal: not stated Progress towards PT goals: Progressing toward goals    Frequency  7X/week    PT Plan Current plan remains appropriate    Co-evaluation             End of Session Equipment Utilized During Treatment: Gait belt Activity Tolerance: Patient tolerated treatment well Patient left: in chair;with call bell/phone within reach;with family/visitor present     Time: 8381-8403 PT Time Calculation (min) (ACUTE ONLY): 33 min  Charges:  $Gait Training: 23-37  mins                    G Codes:      [name] 02/15/2014, 12:58 PM   [name], PT, DPT Pager #: [phone] Office #: [phone]

## 2014-02-15 NOTE — Discharge Instructions (Signed)
Diet: As you were doing prior to hospitalization   Activity:  Increase activity slowly as tolerated                  No lifting or driving for 6 weeks  Shower:  May shower without a dressing once there is no drainage from your wound.                 Do NOT wash over the wound.                 Dressing:  You may change your dressing on Wednesday                    Then change the dressing daily with sterile 4"x4"s gauze dressing                     And TED hose for knees.  Weight Bearing:  Weight bearing as tolerated as taught in physical therapy.  Use a                                walker or Crutches as instructed.  To prevent constipation: you may use a stool softener such as -               Colace ( over the counter) 100 mg by mouth twice a day                Drink plenty of fluids ( prune juice may be helpful) and high fiber foods                Miralax ( over the counter) for constipation as needed.    Precautions:  If you experience chest pain or shortness of breath - call 911 immediately               For transfer to the hospital emergency department!!               If you develop a fever greater that 101 F, purulent drainage from wound,                             increased redness or drainage from wound, or calf pain -- Call the office.  Follow- Up Appointment:  Please call for an appointment to be seen on 03/01/14                                              [facility] office:  [phone]            [address] [place], [place] [address]

## 2014-02-15 NOTE — Progress Notes (Signed)
02/15/14 Set up with [facility] for HHPT by MD office. Spoke with patient and his wife, no change in discharge plan. Wife will be avavilable to assist after d/c. Spoke with Ruby Cola with [employer], they will be providing CPM. Patient states that he has a rolling walker and 3N1 at home.No other d/c needs identified.

## 2014-02-15 NOTE — Evaluation (Addendum)
Occupational Therapy Evaluation Patient Details Name: [name] Ray MRN: [id number] DOB: 1933-11-29 Today's Date: 02/15/2014    History of Present Illness Pt is a 70 male admitted 2/1 for elective L TKA. Pt had R TKA 05/2013.   Clinical Impression   Pt admitted with the above diagnoses and presents with below problem list. Pt will benefit from continued acute OT to address the below listed deficits and maximize independence with basic ADLs prior to d/c home with family. PTA pt was independent with ADLs. Pt currently at min A for LB ADLs and functional mobility. Pt c/o dizziness this session and needing min A for balance. Acute OT to continue to follow.      Follow Up Recommendations  Supervision/Assistance - 24 hour;No OT follow up    Equipment Recommendations  None recommended by OT;Other (comment) (pt has recommended equipment)    Recommendations for Other Services       Precautions / Restrictions Precautions Precautions: Fall Precaution Comments: educated on precautions as related to ADLs Restrictions Weight Bearing Restrictions: Yes LLE Weight Bearing: Weight bearing as tolerated      Mobility Bed Mobility               General bed mobility comments: in recliner  Transfers Overall transfer level: Needs assistance Equipment used: Rolling walker (2 wheeled) Transfers: Sit to/from Stand Sit to Stand: Min assist         General transfer comment: light min A for balance, cues for technique    Balance Overall balance assessment: Needs assistance         Standing balance support: Bilateral upper extremity supported;During functional activity Standing balance-Leahy Scale: Poor Standing balance comment: needs rw for balance                            ADL Overall ADL's : Needs assistance/impaired Eating/Feeding: Set up;Sitting   Grooming: Set up;Sitting   Upper Body Bathing: Set up;Sitting   Lower Body Bathing: Minimal assistance;Sit to/from  stand;With adaptive equipment   Upper Body Dressing : Set up;Sitting   Lower Body Dressing: Minimal assistance;With adaptive equipment;Sit to/from stand   Toilet Transfer: Minimal assistance;Ambulation;RW   Toileting- Clothing Manipulation and Hygiene: Minimal assistance;Sit to/from stand   Tub/ Shower Transfer: Minimal assistance;Ambulation;3 in 1;Rolling walker   Functional mobility during ADLs: Minimal assistance;Rolling walker General ADL Comments: Light min A for balance for LB ADLs and functional mobility. Reviewed ADL education and use of AE. Pt expressed fear of falling. Educated pt and spouse on fall prevention measures.      Vision                     Perception     Praxis      Pertinent Vitals/Pain Pain Assessment: 0-10 Pain Score: 4  Pain Location: Lt knee Pain Intervention(s): Monitored during session     Hand Dominance Right   Extremity/Trunk Assessment Upper Extremity Assessment Upper Extremity Assessment: Overall WFL for tasks assessed   Lower Extremity Assessment Lower Extremity Assessment: Defer to PT evaluation       Communication Communication Communication: No difficulties   Cognition Arousal/Alertness: Awake/alert Behavior During Therapy: WFL for tasks assessed/performed Overall Cognitive Status: Within Functional Limits for tasks assessed                     General Comments   Pt c/o dizziness    Exercises  Shoulder Instructions      Home Living Family/patient expects to be discharged to:: Private residence Living Arrangements: Spouse/significant other Available Help at Discharge: Family Type of Home: House Home Access: Stairs to enter Technical brewer of Steps: 1 Entrance Stairs-Rails: None Home Layout: One level     Bathroom Shower/Tub: Occupational psychologist: Standard Bathroom Accessibility: Yes How Accessible: Accessible via walker Home Equipment: Donnelly - 2 wheels;Shower  seat;Bedside commode;Adaptive equipment Adaptive Equipment: Reacher;Sock aid        Prior Functioning/Environment Level of Independence: Independent             OT Diagnosis: Acute pain   OT Problem List: Impaired balance (sitting and/or standing);Decreased knowledge of use of DME or AE;Decreased knowledge of precautions;Pain   OT Treatment/Interventions: Self-care/ADL training;DME and/or AE instruction;Therapeutic activities;Patient/family education;Balance training    OT Goals(Current goals can be found in the care plan section) Acute Rehab OT Goals Patient Stated Goal: not stated OT Goal Formulation: With patient/family Time For Goal Achievement: 02/22/14 Potential to Achieve Goals: Good ADL Goals Pt Will Perform Lower Body Dressing: with supervision;with adaptive equipment;sit to/from stand Pt Will Transfer to Toilet: with supervision;ambulating (3n1 over toilet) Pt Will Perform Toileting - Clothing Manipulation and hygiene: with supervision;sit to/from stand  OT Frequency: Min 2X/week   Barriers to D/C:            Co-evaluation              End of Session Equipment Utilized During Treatment: Gait belt;Rolling walker CPM Left Knee CPM Left Knee:   Activity Tolerance: Patient tolerated treatment well Patient left: in chair;with call bell/phone within reach;with family/visitor present   Time: 3614-4315 OT Time Calculation (min): 22 min Charges:  OT General Charges $OT Visit: 1 Procedure OT Evaluation $Initial OT Evaluation Tier I: 1 Procedure OT Treatments $Self Care/Home Management : 8-22 mins G-Codes:    Hortencia Pilar 03-08-2014, 11:10 AM

## 2014-02-15 NOTE — Progress Notes (Signed)
Physical Therapy Treatment Note  Clinical Impression: Pt with improved gait pattern this afternoon with decreased episodes of Lt knee instability. Pt with improved L knee ROM and ability to tolerate LAQ. Pt safe to d/c home with assist of spouse once medically cleared.    02/15/14 1428  PT Visit Information  Last PT Received On 02/15/14  Assistance Needed +1  History of Present Illness Pt is a 61 male admitted 2/1 for elective L TKA. Pt had R TKA 05/2013.  PT Time Calculation  PT Start Time (ACUTE ONLY) 1428  PT Stop Time (ACUTE ONLY) 1446  PT Time Calculation (min) (ACUTE ONLY) 18 min  Precautions  Precautions Fall  Precaution Comments educated on precautions  Restrictions  Weight Bearing Restrictions Yes  LLE Weight Bearing WBAT  Pain Assessment  Pain Assessment 0-10  Pain Score 5  Pain Location Lt knee  Pain Intervention(s) Monitored during session  Cognition  Arousal/Alertness Awake/alert  Behavior During Therapy WFL for tasks assessed/performed  Overall Cognitive Status Within Functional Limits for tasks assessed  Bed Mobility  General bed mobility comments pt up in chair  Transfers  Overall transfer level Needs assistance  Equipment used Rolling walker (2 wheeled)  Transfers Sit to/from Stand  Sit to Stand Min guard  General transfer comment pt with improved transition of UEs from chair to walker  Ambulation/Gait  Ambulation/Gait assistance Min guard  Ambulation Distance (Feet) 230 Feet  Assistive device Rolling walker (2 wheeled)  Gait Pattern/deviations Step-through pattern  Gait velocity slow  General Gait Details instructed to take smaller steps and complete quad set with L LE during stance phase to prevent Lt knee instability. pt with only 2 episodes of instability compared to many in AM session  Exercises  Exercises Total Joint  Total Joint Exercises  Ankle Circles/Pumps AROM;Both;5 reps  Heel Slides AROM;Left;10 reps;Seated  Long Arc Quad AROM;Left;10  reps;Seated (with 5 sec hold)  PT - End of Session  Equipment Utilized During Treatment Gait belt  Activity Tolerance Patient tolerated treatment well  Patient left in chair;with call bell/phone within reach;with family/visitor present  Nurse Communication Mobility status  PT - Assessment/Plan  PT Plan Current plan remains appropriate  PT Frequency (ACUTE ONLY) 7X/week  Follow Up Recommendations Home health PT;Supervision/Assistance - 24 hour  PT equipment None recommended by PT  PT Goal Progression  Progress towards PT goals Progressing toward goals  PT General Charges  $$ ACUTE PT VISIT 1 Procedure  PT Treatments  $Gait Training 8-22 mins    [name], PT, DPT Pager #: [phone] Office #: [phone]

## 2014-02-15 NOTE — Progress Notes (Signed)
SPORTS MEDICINE AND JOINT REPLACEMENT  Lara Mulch, MD   Carlynn Spry, PA-C [place], [place], [place]  [address]                             [phone]   PROGRESS NOTE  Subjective:  negative for Chest Pain  negative for Shortness of Breath  negative for Nausea/Vomiting   negative for Calf Pain  negative for Bowel Movement   Tolerating Diet: yes         Patient reports pain as 5 on 0-10 scale.    Objective: Vital signs in last 24 hours:   Patient Vitals for the past 24 hrs:  BP Temp Temp src Pulse Resp SpO2  02/15/14 1331 (!) 114/46 mmHg 98.8 F (37.1 C) Oral 82 18 97 %  02/15/14 1200 - - - - 16 100 %  02/15/14 0800 - - - - 16 100 %  02/15/14 0624 (!) 130/55 mmHg 97.5 F (36.4 C) Oral 85 - 100 %  02/15/14 0400 - - - - 16 100 %  02/15/14 0140 (!) 130/50 mmHg 97.4 F (36.3 C) Oral 87 - 100 %  02/15/14 0000 - - - - 16 100 %  02/14/14 2000 - - - - 16 100 %  02/14/14 1926 (!) 125/57 mmHg 97.8 F (36.6 C) Oral 87 - 97 %  02/14/14 1419 (!) 132/58 mmHg 97.4 F (36.3 C) - 78 16 100 %  02/14/14 1341 - 97.7 F (36.5 C) - - - -    @flow {1959:LAST@   Intake/Output from previous day:   02/01 0701 - 02/02 0700 In: 1000 [I.V.:1000] Out: 1145 [Urine:375; Drains:570]   Intake/Output this shift:   02/02 0701 - 02/02 1900 In: 480 [P.O.:480] Out: 100 [Drains:100]   Intake/Output      02/01 0701 - 02/02 0700 02/02 0701 - 02/03 0700   P.O.  480   I.V. (mL/kg) 1000 (9.8)    Total Intake(mL/kg) 1000 (9.8) 480 (4.7)   Urine (mL/kg/hr) 375 0 (0)   Drains 570 100 (0.1)   Blood 200    Total Output 1145 100   Net -145 +380        Urine Occurrence 2 x 1 x      LABORATORY DATA:  Recent Labs  02/15/14 0559  WBC 7.3  HGB 9.3*  HCT 28.5*  PLT 166    Recent Labs  02/15/14 0559  NA 135  K 3.8  CL 103  CO2 27  BUN 13  CREATININE 1.13  GLUCOSE 119*  CALCIUM 7.8*   Lab Results  Component Value Date   INR 1.01 02/02/2014   INR 0.94 05/14/2013   INR  0.9 ratio 09/25/2009    Examination:  General appearance: alert, cooperative and no distress Extremities: extremities normal, atraumatic, no cyanosis or edema and Homans sign is negative, no sign of DVT  Wound Exam: clean, dry, intact   Drainage:  Scant/small amount Serosanguinous exudate  Motor Exam: EHL and FHL Intact  Sensory Exam: Deep Peroneal normal   Assessment:    1 Day Post-Op  Procedure(s) (LRB): LEFT TOTAL KNEE ARTHROPLASTY (Left)  ADDITIONAL DIAGNOSIS:  Active Problems:   S/P total knee arthroplasty  Acute Blood Loss Anemia   Plan: Physical Therapy as ordered Weight Bearing as Tolerated (WBAT)    DISCHARGE PLAN: Home  DISCHARGE NEEDS: HHPT, CPM, Walker and 3-in-1 comode seat         Angelo Prindle  02/15/2014, 1:40 PM

## 2014-02-16 NOTE — Op Note (Signed)
TOTAL KNEE REPLACEMENT OPERATIVE NOTE:  02/14/2014  2:45 PM  PATIENT:  [name] Ray  81 y.o. male  PRE-OPERATIVE DIAGNOSIS:  primary osteoarthritis left knee  POST-OPERATIVE DIAGNOSIS:  primary osteoarthritis left knee  PROCEDURE:  Procedure(s): LEFT TOTAL KNEE ARTHROPLASTY  SURGEON:  Surgeon(s): [name], MD  PHYSICIAN ASSISTANT: Carlynn Spry, [facility]  ANESTHESIA:   general  DRAINS: Hemovac  SPECIMEN: None  COUNTS:  Correct  TOURNIQUET:   Total Tourniquet Time Documented: Thigh (Left) - 72 minutes Total: Thigh (Left) - 72 minutes   DICTATION:  Indication for procedure:    The patient is a 81 y.o. male who has failed conservative treatment for primary osteoarthritis left knee.  Informed consent was obtained prior to anesthesia. The risks versus benefits of the operation were explain and in a way the patient can, and did, understand.   On the implant demand matching protocol, this patient scored 10.  Therefore, this patient was receive a polyethylene insert with vitamin E which is a high demand implant.  Description of procedure:     The patient was taken to the operating room and placed under anesthesia.  The patient was positioned in the usual fashion taking care that all body parts were adequately padded and/or protected.  I foley catheter was not placed.  A tourniquet was applied and the leg prepped and draped in the usual sterile fashion.  The extremity was exsanguinated with the esmarch and tourniquet inflated to 350 mmHg.  Pre-operative range of motion was normal.  The knee was in 5 degree of mild varus.  A midline incision approximately 6-7 inches long was made with a #10 blade.  A new blade was used to make a parapatellar arthrotomy going 2-3 cm into the quadriceps tendon, over the patella, and alongside the medial aspect of the patellar tendon.  A synovectomy was then performed with the #10 blade and forceps. I then elevated the deep MCL off the medial tibial  metaphysis subperiosteally around to the semimembranosus attachment.    I everted the patella and used calipers to measure patellar thickness.  I used the reamer to ream down to appropriate thickness to recreate the native thickness.  I then removed excess bone with the rongeur and sagittal saw.  I used the appropriately sized template and drilled the three lug holes.  I then put the trial in place and measured the thickness with the calipers to ensure recreation of the native thickness.  The trial was then removed and the patella subluxed and the knee brought into flexion.  A homan retractor was place to retract and protect the patella and lateral structures.  A Z-retractor was place medially to protect the medial structures.  The extra-medullary alignment system was used to make cut the tibial articular surface perpendicular to the anamotic axis of the tibia and in 3 degrees of posterior slope.  The cut surface and alignment jig was removed.  I then used the intramedullary alignment guide to make a 6 valgus cut on the distal femur.  I then marked out the epicondylar axis on the distal femur.  The posterior condylar axis measured 3 degrees.  I then used the anterior referencing sizer and measured the femur to be a size 3.5.  The 4-In-1 cutting block was screwed into place in external rotation matching the posterior condylar angle, making our cuts perpendicular to the epicondylar axis.  Anterior, posterior and chamfer cuts were made with the sagittal saw.  The cutting block and cut pieces were  removed.  A lamina spreader was placed in 90 degrees of flexion.  The ACL, PCL, menisci, and posterior condylar osteophytes were removed.  A 12 mm spacer blocked was found to offer good flexion and extension gap balance after moderate in degree releasing.   The scoop retractor was then placed and the femoral finishing block was pinned in place.  The small sagittal saw was used as well as the lug drill to finish the  femur.  The block and cut surfaces were removed and the medullary canal hole filled with autograft bone from the cut pieces.  The tibia was delivered forward in deep flexion and external rotation.  A size 4 tray was selected and pinned into place centered on the medial 1/3 of the tibial tubercle.  The reamer and keel was used to prepare the tibia through the tray.    I then trialed with the size 3.5 femur, size 4 tibia, a 12 mm insert and the 32 patella.  I had excellent flexion/extension gap balance, excellent patella tracking.  Flexion was full and beyond 120 degrees; extension was zero.  These components were chosen and the staff opened them to me on the back table while the knee was lavaged copiously and the cement mixed.  The soft tissue was infiltrated with 60cc of exparel 1.3% through a 21 gauge needle.  I cemented in the [facility] components and removed all excess cement.  The polyethylene tibial component was snapped into place and the knee placed in extension while cement was hardening.  The capsule was infilltrated with 30cc of .25% Marcaine with epinephrine.  A hemovac was place in the joint exiting superolaterally.  A pain pump was place superomedially superficial to the arthrotomy.  Once the cement was hard, the tourniquet was let down.  Hemostasis was obtained.  The arthrotomy was closed with figure-8 #1 vicryl sutures.  The deep soft tissues were closed with #0 vicryls and the subcuticular layer closed with a running #2-0 vicryl.  The skin was reapproximated and closed with skin staples.  The wound was dressed with xeroform, 4 x4's, 2 ABD sponges, a single layer of webril and a TED stocking.   The patient was then awakened, extubated, and taken to the recovery room in stable condition.  BLOOD LOSS:  300cc DRAINS: 1 hemovac, 1 pain catheter COMPLICATIONS:  None.  PLAN OF CARE: Admit to inpatient   PATIENT DISPOSITION:  PACU - hemodynamically stable.   Delay start of Pharmacological  VTE agent (>24hrs) due to surgical blood loss or risk of bleeding:  not applicable  Please fax a copy of this op note to my office at [phone] (please only include page 1 and 2 of the Case Information op note)

## 2014-02-17 ENCOUNTER — Encounter (HOSPITAL_COMMUNITY): Payer: Self-pay | Admitting: Orthopedic Surgery

## 2014-02-22 NOTE — Discharge Summary (Signed)
[place]   Lara Mulch, MD   Carlynn Spry, PA-C [address], [place], [place]  [address]                             [phone]  PATIENT ID: [name] Ray        MRN:  [id number]          DOB/AGE: 1933-12-23 / 81 y.o.    DISCHARGE SUMMARY  ADMISSION DATE:    02/14/2014 DISCHARGE DATE:   02/15/2014  ADMISSION DIAGNOSIS: primary osteoarthritis left knee    DISCHARGE DIAGNOSIS:  primary osteoarthritis left knee    ADDITIONAL DIAGNOSIS: Active Problems:   S/P total knee arthroplasty  Past Medical History  Diagnosis Date  . GERD (gastroesophageal reflux disease)   . IBS (irritable bowel syndrome)   . Adenomatous polyp     history  . Diverticulosis   . Fatty liver     history  . Dyslipidemia   . Atrial bigeminy     in the past  . Numbness and tingling in hands     resolved  . BPH (benign prostatic hyperplasia)   . Colon polyps   . Overweight(278.02)   . Hypertension     [facility] - 5 stents total , last cardiac cath. 2011.  Cleared for  surgery by Dr. [name]- 04/23/2013  . Carotid artery disease     a. Doppler, January, 2012, 40-59% bilateral stenoses;  b.  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 Myocardial infarction 2002  . Type II diabetes mellitus   . Arthritis     knees , shoulders, hands   . Prostate cancer     treated /w  Lupron injection x1, then radium  . Shortness of breath dyspnea   . Coronary artery disease     acute inferior MI Jan 2002 with stent / Taxus stent LAD 2004 / Cypher stent prox LAD and cypher stent mid circ May 2006 / nuclear stress Dec 2009 normal EF 63% / nuclear August 2011 EF 62% / inferior thining and very mild ischemia inferior and apex / catherization Sept 2011 EF 60%  mild to moderate diffuse RCA disease, moderate discrete LAD stenosis before the stent in the proximal LAD med tx rec    PROCEDURE: Procedure(s): LEFT TOTAL KNEE ARTHROPLASTY on  02/14/2014  CONSULTS:     HISTORY:  See H&P in chart  HOSPITAL COURSE:  [name] Ray is a 81 y.o. admitted on 02/14/2014 and found to have a diagnosis of primary osteoarthritis left knee.  After appropriate laboratory studies were obtained  they were taken to the operating room on 02/14/2014 and underwent Procedure(s): LEFT TOTAL KNEE ARTHROPLASTY.   They were given perioperative antibiotics:  Anti-infectives    Start     Dose/Rate Route Frequency Ordered Stop   02/14/14 1600  ceFAZolin (ANCEF) IVPB 2 g/50 mL premix     2 g100 mL/hr over 30 Minutes Intravenous Every 6 hours 02/14/14 1425 02/14/14 2209   02/14/14 0600  ceFAZolin (ANCEF) IVPB 2 g/50 mL premix     2 g100 mL/hr over 30 Minutes Intravenous On call to O.R. 02/13/14 1341 02/14/14 1021    .  Tolerated the procedure well.  Placed with a foley intraoperatively.  Given Ofirmev at induction and for 48 hours.    POD# 1: Vital signs were stable.  Patient denied Chest  pain, shortness of breath, or calf pain.  Patient was started on Lovenox 30 mg subcutaneously twice daily at 8am.  Consults to PT, OT, and care management were made.  The patient was weight bearing as tolerated.  CPM was placed on the operative leg 0-90 degrees for 6-8 hours a day.  Incentive spirometry was taught.  Dressing was changed.  Hemovac was discontinued.     Continued  PT for ambulation and exercise program.  IV saline locked.  O2 discontinued.    The remainder of the hospital course was dedicated to ambulation and strengthening.   The patient was discharged on post op day 1 in  Good condition.  Blood products given:none  DIAGNOSTIC STUDIES: Recent vital signs: No data found.      Recent laboratory studies: No results for input(s): WBC, HGB, HCT, PLT in the last 168 hours. No results for input(s): NA, K, CL, CO2, BUN, CREATININE, GLUCOSE, CALCIUM in the last 168 hours. Lab Results  Component Value Date   INR 1.01 02/02/2014   INR 0.94 05/14/2013   INR  0.9 ratio 09/25/2009     Recent Radiographic Studies :  No results found.  DISCHARGE INSTRUCTIONS: Discharge Instructions    CPM    Complete by:  As directed   Continuous passive motion machine (CPM):      Use the CPM from 0 to 90 for 6-8 hours per day.      You may increase by 10 per day.  You may break it up into 2 or 3 sessions per day.      Use CPM for 2 weeks or until you are told to stop.     Call MD / Call 911    Complete by:  As directed   If you experience chest pain or shortness of breath, CALL 911 and be transported to the hospital emergency room.  If you develope a fever above 101 F, pus (white drainage) or increased drainage or redness at the wound, or calf pain, call your surgeon's office.     Change dressing    Complete by:  As directed   Change dressing on wednesday, then change the dressing daily with sterile 4 x 4 inch gauze dressing and apply TED hose.     Constipation Prevention    Complete by:  As directed   Drink plenty of fluids.  Prune juice may be helpful.  You may use a stool softener, such as Colace (over the counter) 100 mg twice a day.  Use MiraLax (over the counter) for constipation as needed.     Diet - low sodium heart healthy    Complete by:  As directed      Do not put a pillow under the knee. Place it under the heel.    Complete by:  As directed      Driving restrictions    Complete by:  As directed   No driving for 6 weeks     Increase activity slowly as tolerated    Complete by:  As directed      Lifting restrictions    Complete by:  As directed   No lifting for 6 weeks     TED hose    Complete by:  As directed   Use stockings (TED hose) for 2 weeks on both leg(s).  You may remove them at night for sleeping.           DISCHARGE MEDICATIONS:     Medication List  TAKE these medications        amLODipine 5 MG tablet  Commonly known as:  NORVASC  Take 5 mg by mouth at bedtime.     aspirin 325 MG EC tablet  Take 325 mg by mouth  daily.     clopidogrel 75 MG tablet  Commonly known as:  PLAVIX  Take 75 mg by mouth daily.     KOMBIGLYZE XR 05-998 MG Tb24  Generic drug:  Saxagliptin-Metformin  Take 1 tablet by mouth 2 (two) times daily.     loratadine 10 MG tablet  Commonly known as:  CLARITIN  Take 10 mg by mouth daily as needed for allergies.     METAMUCIL PO  Take 5 mLs by mouth daily as needed. Sugar free     methocarbamol 500 MG tablet  Commonly known as:  ROBAXIN  Take 1-2 tablets (500-1,000 mg total) by mouth every 6 (six) hours as needed for muscle spasms.     multivitamin tablet  Take 1 tablet by mouth daily. Men's 50+     nitroGLYCERIN 0.4 MG SL tablet  Commonly known as:  NITROSTAT  Place 0.4 mg under the tongue every 5 (five) minutes as needed for chest pain.     omeprazole 20 MG capsule  Commonly known as:  PRILOSEC  Take 20 mg by mouth daily. Take 30 minutes before meals     oxyCODONE 5 MG immediate release tablet  Commonly known as:  Oxy IR/ROXICODONE  Take 1-2 tablets (5-10 mg total) by mouth every 3 (three) hours as needed for breakthrough pain.     OxyCODONE 10 mg T12a 12 hr tablet  Commonly known as:  OXYCONTIN  Take 1 tablet (10 mg total) by mouth every 12 (twelve) hours.     ramipril 10 MG capsule  Commonly known as:  ALTACE  Take 10 mg by mouth daily after breakfast.     simvastatin 20 MG tablet  Commonly known as:  ZOCOR  Take 20 mg by mouth at bedtime.     tamsulosin 0.4 MG Caps capsule  Commonly known as:  FLOMAX  Take 0.4 mg by mouth at bedtime.     VITAMIN B 12 PO  Take 1,000 mcg by mouth daily after breakfast.        FOLLOW UP VISIT:       Follow-up Information    Follow up with [facility].   Why:  They will contact you to schedule home therapy visits.   Contact information:   [address] [place] [address] [phone]       Follow up with Rudean Haskell, MD. Call on 03/01/2014.   Specialty:  Orthopedic Surgery    Contact information:   [place] [place] [address] [phone]       DISPOSITION: HOME   CONDITION:  Good   Davonne Baby 02/22/2014, 8:35 AM

## 2014-05-03 ENCOUNTER — Encounter: Payer: Self-pay | Admitting: Cardiology

## 2014-05-03 DIAGNOSIS — R9430 Abnormal result of cardiovascular function study, unspecified: Secondary | ICD-10-CM | POA: Insufficient documentation

## 2014-05-04 ENCOUNTER — Encounter: Payer: Self-pay | Admitting: Cardiology

## 2014-05-04 ENCOUNTER — Ambulatory Visit (INDEPENDENT_AMBULATORY_CARE_PROVIDER_SITE_OTHER): Payer: Medicare Other | Admitting: Cardiology

## 2014-05-04 VITALS — BP 120/60 | HR 59 | Ht 65.0 in | Wt 210.0 lb

## 2014-05-04 DIAGNOSIS — I2510 Atherosclerotic heart disease of native coronary artery without angina pectoris: Secondary | ICD-10-CM | POA: Diagnosis not present

## 2014-05-04 DIAGNOSIS — R0989 Other specified symptoms and signs involving the circulatory and respiratory systems: Secondary | ICD-10-CM | POA: Diagnosis not present

## 2014-05-04 DIAGNOSIS — R42 Dizziness and giddiness: Secondary | ICD-10-CM

## 2014-05-04 DIAGNOSIS — I739 Peripheral vascular disease, unspecified: Secondary | ICD-10-CM

## 2014-05-04 DIAGNOSIS — R9430 Abnormal result of cardiovascular function study, unspecified: Secondary | ICD-10-CM

## 2014-05-04 DIAGNOSIS — I779 Disorder of arteries and arterioles, unspecified: Secondary | ICD-10-CM

## 2014-05-04 NOTE — Patient Instructions (Signed)
**Note De-Identified [place] Obfuscation** Medication Instructions:  None  Labwork: None  Testing/Procedures: Your physician has requested that you have an echocardiogram. Echocardiography is a painless test that uses sound waves to create images of your heart. It provides your doctor with information about the size and shape of your heart and how well your heart's chambers and valves are working. This procedure takes approximately one hour. There are no restrictions for this procedure.    Follow-Up: Your physician recommends that you schedule a follow-up appointment in: 8 weeks (done)    Any Other Special Instructions Will Be Listed Below (If Applicable).

## 2014-05-04 NOTE — Progress Notes (Signed)
Cardiology Office Note   Date:  05/04/2014   ID:  [name] Ray, DOB 01-Dec-1933, MRN [id number]  PCP:  Haywood Pao, MD  Cardiologist:  Dola Argyle, MD   Chief Complaint  Patient presents with  . Appointment    Follow-up coronary artery disease      History of Present Illness: [name] Ray is a 81 y.o. male who presents today to follow-up coronary disease. I saw him last October, 2015. He's been relatively stable. He is not having any chest pain. His last catheterization was done in 2011. Ejection fraction was 60% at that time. Over the past year he has had 2 separate knee operations. They were both successful without any cardiovascular issues. He tells me today that sometimes he feels lightheaded with exertion. This is random. He does not have chest pain, shortness of breath, syncope or presyncope.    Past Medical History  Diagnosis Date  . GERD (gastroesophageal reflux disease)   . IBS (irritable bowel syndrome)   . Adenomatous polyp     history  . Diverticulosis   . Fatty liver     history  . Dyslipidemia   . Atrial bigeminy     in the past  . Numbness and tingling in hands     resolved  . BPH (benign prostatic hyperplasia)   . Colon polyps   . Overweight(278.02)   . Hypertension     [facility] - 5 stents total , last cardiac cath. 2011.  Cleared for  surgery by Dr. [name]- 04/23/2013  . Carotid artery disease     a. Doppler, January, 2012, 40-59% bilateral stenoses;  b.  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 Myocardial infarction 2002  . Type II diabetes mellitus   . Arthritis     knees , shoulders, hands   . Prostate cancer     treated /w  Lupron injection x1, then radium  . Shortness of breath dyspnea   . Coronary artery disease     acute inferior MI Jan 2002 with stent / Taxus stent LAD 2004 / Cypher stent prox LAD and cypher stent mid circ May 2006 / nuclear stress Dec 2009 normal EF 63% / nuclear August  2011 EF 62% / inferior thining and very mild ischemia inferior and apex / catherization Sept 2011 EF 60%  mild to moderate diffuse RCA disease, moderate discrete LAD stenosis before the stent in the proximal LAD med tx rec  . Ejection fraction     Past Surgical History  Procedure Laterality Date  . Neuroplasty / transposition median nerve at carpal tunnel Left     also surgery on thumb, elbow- nerve release   . Prostate biopsy      followed later by [facility]  . Total knee arthroplasty Right 05/24/2013  . Total knee arthroplasty Right 05/24/2013    Procedure: RIGHT TOTAL KNEE ARTHROPLASTY;  Surgeon: [name], MD;  Location: [facility];  Service: Orthopedics;  Laterality: Right;  . Cardiac catheterization      last cath- 2006  . Coronary angioplasty with stent placement      multiple stents in LAD:  "I've got 5 stents" (05/24/2013)  . Total knee arthroplasty Left 02/14/2014    Procedure: LEFT TOTAL KNEE ARTHROPLASTY;  Surgeon: [name], MD;  Location: [facility];  Service: Orthopedics;  Laterality: Left;    Patient Active Problem List   Diagnosis Date Noted  . Carotid artery disease  Priority: High  . Coronary artery disease     Priority: High  . Dyslipidemia     Priority: High  . Hypertension     Priority: High  . Numbness and tingling in hands     Priority: High  . Ejection fraction   . S/P total knee arthroplasty 05/24/2013  . GERD (gastroesophageal reflux disease)   . FECAL OCCULT BLOOD 10/19/2009  . COLONIC POLYPS, ADENOMATOUS, HX OF 10/19/2009  . OVERWEIGHT 11/08/2008  . DM 10/25/2008  . GERD 10/25/2008      Current Outpatient Prescriptions  Medication Sig Dispense Refill  . amLODipine (NORVASC) 5 MG tablet Take 5 mg by mouth at bedtime.     Marland Kitchen aspirin 325 MG EC tablet Take 325 mg by mouth daily.      . clopidogrel (PLAVIX) 75 MG tablet Take 75 mg by mouth daily.      . Cyanocobalamin (VITAMIN B 12 PO) Take 1,000 mcg by mouth daily after breakfast.     . loratadine  (CLARITIN) 10 MG tablet Take 10 mg by mouth daily as needed for allergies.     . Multiple Vitamin (MULTIVITAMIN) tablet Take 1 tablet by mouth daily. Men's 50+    . nitroGLYCERIN (NITROSTAT) 0.4 MG SL tablet Place 0.4 mg under the tongue every 5 (five) minutes as needed for chest pain.     Marland Kitchen omeprazole (PRILOSEC) 20 MG capsule Take 20 mg by mouth daily. Take 30 minutes before meals    . Psyllium (METAMUCIL PO) Take 5 mLs by mouth daily as needed. Sugar free    . ramipril (ALTACE) 10 MG capsule Take 10 mg by mouth daily after breakfast.     . Saxagliptin-Metformin (KOMBIGLYZE XR) 05-998 MG TB24 Take 1 tablet by mouth 2 (two) times daily.    . simvastatin (ZOCOR) 20 MG tablet Take 20 mg by mouth at bedtime.      . tamsulosin (FLOMAX) 0.4 MG CAPS capsule Take 0.4 mg by mouth at bedtime.      No current facility-administered medications for this visit.    Allergies:   Atorvastatin; Rosuvastatin; and Clindamycin    Social History:  The patient  reports that he has never smoked. He has never used smokeless tobacco. He reports that he does not drink alcohol or use illicit drugs.   Family History:  The patient's family history includes Heart attack in his paternal grandfather; Heart disease in his brother and father; Hypertension in his father.    ROS:  Please see the history of present illness.     Patient denies fever, chills, headache, sweats, rash, change in vision, change in hearing, chest pain, cough, nausea vomiting, urinary symptoms. All other systems are reviewed and are negative.   PHYSICAL EXAM: VS:  BP 120/60 mmHg  Pulse 59  Ht 5\' 5"  (1.651 m)  Wt 210 lb (95.255 kg)  BMI 34.95 kg/m2 , Patient is overweight. He is oriented to person time and place. Affect is normal. Head is atraumatic. Sclera and conjunctiva are normal. There is no jugular venous distention. Lungs are clear. Respiratory effort is nonlabored. Cardiac exam reveals S1 and S2. The abdomen is soft. There is no peripheral  edema. There are no musculoskeletal deformities. There are no skin rashes. Neurologic is grossly intact.  EKG:   EKG is done today and reviewed by me. There is mild sinus bradycardia. There are no significant QRS abnormalities.   Recent Labs: 02/02/2014: ALT 14 02/15/2014: BUN 13; Creatinine 1.13; Hemoglobin 9.3*; Platelets 166;  Potassium 3.8; Sodium 135    Lipid Panel No results found for: CHOL, TRIG, HDL, CHOLHDL, VLDL, LDLCALC, LDLDIRECT    Wt Readings from Last 3 Encounters:  05/04/14 210 lb (95.255 kg)  02/14/14 224 lb (101.606 kg)  10/27/13 220 lb 1.9 oz (99.846 kg)      Current medicines are reviewed  The patient understands his medications.     ASSESSMENT AND PLAN:

## 2014-05-04 NOTE — Assessment & Plan Note (Addendum)
Etiology of intermittent exertional lightheadedness is not clear. After I have echo data he and I will discuss this further in the office. We will also decide if he is to continue full dose aspirin or coated 81 mg. he mentions that I wanted him on full dose aspirin in the past. I do not see any records to indicate that we will have to continue this in the future.

## 2014-05-04 NOTE — Assessment & Plan Note (Signed)
We have no evaluation of his left ventricular function since his last cath in 2011. Two-dimensional echo will be done. I will then see him back for follow-up and we will discuss issues further.

## 2014-05-04 NOTE — Assessment & Plan Note (Signed)
The patient's last cath in 2011 revealed moderate disease. He is not having chest pain. He is having some exertional lightheadedness. I will be seeing him back to assess this further after we have echo data.

## 2014-05-04 NOTE — Assessment & Plan Note (Signed)
His carotid disease is being followed carefully.

## 2014-05-06 ENCOUNTER — Ambulatory Visit (HOSPITAL_COMMUNITY): Payer: Medicare Other | Attending: Cardiology | Admitting: Radiology

## 2014-05-06 DIAGNOSIS — I10 Essential (primary) hypertension: Secondary | ICD-10-CM | POA: Insufficient documentation

## 2014-05-06 DIAGNOSIS — E119 Type 2 diabetes mellitus without complications: Secondary | ICD-10-CM | POA: Diagnosis not present

## 2014-05-06 DIAGNOSIS — E785 Hyperlipidemia, unspecified: Secondary | ICD-10-CM | POA: Insufficient documentation

## 2014-05-06 DIAGNOSIS — I2510 Atherosclerotic heart disease of native coronary artery without angina pectoris: Secondary | ICD-10-CM

## 2014-05-06 DIAGNOSIS — E669 Obesity, unspecified: Secondary | ICD-10-CM | POA: Insufficient documentation

## 2014-05-06 NOTE — Progress Notes (Signed)
Echocardiogram performed.  

## 2014-05-24 ENCOUNTER — Other Ambulatory Visit: Payer: Self-pay | Admitting: Cardiology

## 2014-05-24 DIAGNOSIS — I6523 Occlusion and stenosis of bilateral carotid arteries: Secondary | ICD-10-CM

## 2014-05-31 ENCOUNTER — Ambulatory Visit (HOSPITAL_COMMUNITY): Payer: Medicare Other | Attending: Cardiovascular Disease

## 2014-05-31 DIAGNOSIS — I6523 Occlusion and stenosis of bilateral carotid arteries: Secondary | ICD-10-CM | POA: Insufficient documentation

## 2014-06-20 ENCOUNTER — Encounter: Payer: Self-pay | Admitting: Cardiology

## 2014-06-20 ENCOUNTER — Ambulatory Visit (INDEPENDENT_AMBULATORY_CARE_PROVIDER_SITE_OTHER): Payer: Medicare Other | Admitting: Cardiology

## 2014-06-20 VITALS — BP 142/60 | HR 61 | Ht 65.0 in | Wt 210.8 lb

## 2014-06-20 DIAGNOSIS — E785 Hyperlipidemia, unspecified: Secondary | ICD-10-CM

## 2014-06-20 DIAGNOSIS — R42 Dizziness and giddiness: Secondary | ICD-10-CM

## 2014-06-20 DIAGNOSIS — I779 Disorder of arteries and arterioles, unspecified: Secondary | ICD-10-CM

## 2014-06-20 DIAGNOSIS — I2510 Atherosclerotic heart disease of native coronary artery without angina pectoris: Secondary | ICD-10-CM | POA: Diagnosis not present

## 2014-06-20 DIAGNOSIS — I10 Essential (primary) hypertension: Secondary | ICD-10-CM | POA: Diagnosis not present

## 2014-06-20 DIAGNOSIS — I739 Peripheral vascular disease, unspecified: Secondary | ICD-10-CM

## 2014-06-20 NOTE — Progress Notes (Signed)
Cardiology Office Note   Date:  06/20/2014   ID:  [name] Ray, DOB [birth year], MRN [id number]  PCP:  Haywood Pao, MD  Cardiologist:  Dola Argyle, MD   Chief Complaint  Patient presents with  . Appointment    Follow-up coronary disease      History of Present Illness: [name] Ray is a 81 y.o. male who presents today to follow-up coronary disease. He's also had some dizziness. He has not had syncope or presyncope. I saw him last in April, 2016. We decided to proceed with a 2-D echo. The result was excellent. Ejection fraction was 65-70%. There were no significant valvular abnormalities. Since the last visit he is also had follow-up carotid Doppler. The study shows that he has stable moderate disease. Overall he is doing well.  Today in the office he has specifically requested follow-up with Dr. Burt Knack in the future. We will try to arrange this.    Past Medical History  Diagnosis Date  . GERD (gastroesophageal reflux disease)   . IBS (irritable bowel syndrome)   . Adenomatous polyp     history  . Diverticulosis   . Fatty liver     history  . Dyslipidemia   . Atrial bigeminy     in the past  . Numbness and tingling in hands     resolved  . BPH (benign prostatic hyperplasia)   . Colon polyps   . Overweight(278.02)   . Hypertension     [facility] - 5 stents total , last cardiac cath. 2011.  Cleared for  surgery by Dr. [name]- 04/23/2013  . Carotid artery disease     a. Doppler, January, 2012, 40-59% bilateral stenoses;  b.  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 Myocardial infarction 2002  . Type II diabetes mellitus   . Arthritis     knees , shoulders, hands   . Prostate cancer     treated /w  Lupron injection x1, then radium  . Shortness of breath dyspnea   . Coronary artery disease     acute inferior MI Jan 2002 with stent / Taxus stent LAD 2004 / Cypher stent prox LAD and cypher stent mid circ May 2006 / nuclear  stress Dec 2009 normal EF 63% / nuclear August 2011 EF 62% / inferior thining and very mild ischemia inferior and apex / catherization Sept 2011 EF 60%  mild to moderate diffuse RCA disease, moderate discrete LAD stenosis before the stent in the proximal LAD med tx rec  . Ejection fraction     Past Surgical History  Procedure Laterality Date  . Neuroplasty / transposition median nerve at carpal tunnel Left     also surgery on thumb, elbow- nerve release   . Prostate biopsy      followed later by [facility]  . Total knee arthroplasty Right 05/24/2013  . Total knee arthroplasty Right 05/24/2013    Procedure: RIGHT TOTAL KNEE ARTHROPLASTY;  Surgeon: [name], MD;  Location: [facility];  Service: Orthopedics;  Laterality: Right;  . Cardiac catheterization      last cath- 2006  . Coronary angioplasty with stent placement      multiple stents in LAD:  "I've got 5 stents" (05/24/2013)  . Total knee arthroplasty Left 02/14/2014    Procedure: LEFT TOTAL KNEE ARTHROPLASTY;  Surgeon: [name], MD;  Location: [facility];  Service: Orthopedics;  Laterality: Left;    Patient Active Problem List  Diagnosis Date Noted  . Carotid artery disease     Priority: High  . Coronary artery disease     Priority: High  . Dyslipidemia     Priority: High  . Hypertension     Priority: High  . Numbness and tingling in hands     Priority: High  . Episodic lightheadedness 05/04/2014  . Ejection fraction   . S/P total knee arthroplasty 05/24/2013  . GERD (gastroesophageal reflux disease)   . FECAL OCCULT BLOOD 10/19/2009  . COLONIC POLYPS, ADENOMATOUS, HX OF 10/19/2009  . OVERWEIGHT 11/08/2008  . DM 10/25/2008  . GERD 10/25/2008      Current Outpatient Prescriptions  Medication Sig Dispense Refill  . amLODipine (NORVASC) 5 MG tablet Take 5 mg by mouth at bedtime.     Marland Kitchen aspirin 325 MG EC tablet Take 325 mg by mouth daily.      . clopidogrel (PLAVIX) 75 MG tablet Take 75 mg by mouth daily.      . Cyanocobalamin  (VITAMIN B 12 PO) Take 1,000 mcg by mouth daily after breakfast.     . loratadine (CLARITIN) 10 MG tablet Take 10 mg by mouth daily as needed for allergies.     . Multiple Vitamin (MULTIVITAMIN) tablet Take 1 tablet by mouth daily. Men's 50+    . nitroGLYCERIN (NITROSTAT) 0.4 MG SL tablet Place 0.4 mg under the tongue every 5 (five) minutes as needed for chest pain.     Marland Kitchen omeprazole (PRILOSEC) 20 MG capsule Take 20 mg by mouth daily. Take 30 minutes before meals    . Psyllium (METAMUCIL PO) Take 5 mLs by mouth daily as needed. Sugar free    . ramipril (ALTACE) 10 MG capsule Take 10 mg by mouth daily after breakfast.     . Saxagliptin-Metformin (KOMBIGLYZE XR) 05-998 MG TB24 Take 1 tablet by mouth 2 (two) times daily.    . simvastatin (ZOCOR) 20 MG tablet Take 20 mg by mouth at bedtime.      . tamsulosin (FLOMAX) 0.4 MG CAPS capsule Take 0.4 mg by mouth at bedtime.      No current facility-administered medications for this visit.    Allergies:   Atorvastatin; Rosuvastatin; and Clindamycin    Social History:  The patient  reports that he has never smoked. He has never used smokeless tobacco. He reports that he does not drink alcohol or use illicit drugs.   Family History:  The patient's family history includes Heart attack in his paternal grandfather; Heart disease in his brother and father; Hypertension in his father.    ROS:  Please see the history of present illness.    Patient denies fever, chills, headache, sweats, rash, change in vision, change in hearing, chest pain, cough, nausea or vomiting, urinary symptoms. All other systems are reviewed and are negative  Physical examination:  Patient is oriented to person time and place. Affect is normal. Head is atraumatic. Sclera and conjunctiva are normal. There is no jugulovenous distention. Lungs are clear. Respiratory effort is nonlabored. The patient is overweight. Cardiac exam reveals S1 and S2. Abdomen is soft. There is no peripheral  edema. There are no musculoskeletal deformities. There are no skin rashes.    PHYSICAL EXAM: VS:  BP 142/60 mmHg  Pulse 61  Ht 5\' 5"  (1.651 m)  Wt 210 lb 12.8 oz (95.618 kg)  BMI 35.08 kg/m2  SpO2 98% ,   EKG:   EKG is not done today.   Recent Labs: 02/02/2014: ALT 14  02/15/2014: BUN 13; Creatinine 1.13; Hemoglobin 9.3*; Platelets 166; Potassium 3.8; Sodium 135    Lipid Panel No results found for: CHOL, TRIG, HDL, CHOLHDL, VLDL, LDLCALC, LDLDIRECT    Wt Readings from Last 3 Encounters:  06/20/14 210 lb 12.8 oz (95.618 kg)  05/04/14 210 lb (95.255 kg)  02/14/14 224 lb (101.606 kg)      Current medicines are reviewed  The patient understands his medications.   ASSESSMENT AND PLAN:

## 2014-06-20 NOTE — Assessment & Plan Note (Signed)
His symptoms appeared to be intermittent and positional. They are not severe. I have recommended no change in therapy at this time.

## 2014-06-20 NOTE — Assessment & Plan Note (Signed)
Historically he prefers to stay on lower dose simvastatin as opposed to guideline directed therapy.

## 2014-06-20 NOTE — Assessment & Plan Note (Signed)
Blood pressures controlled. No change in therapy. 

## 2014-06-20 NOTE — Assessment & Plan Note (Signed)
Carotid disease is stable. Most recent Doppler in May, 2016 revealed stable disease.

## 2014-06-20 NOTE — Assessment & Plan Note (Signed)
Coronary disease is stable. His last catheterization was September, 2011. He has been stented in the past. At his last cath he had mild to moderate diffuse RCA disease and moderate discrete LAD stenosis before the stent in the proximal LAD. Medical therapy was recommended at that time and he remained stable. I've chosen not to proceed with exercise testing.

## 2014-06-20 NOTE — Patient Instructions (Signed)
Medication Instructions:  Same-no change  Labwork: None  Testing/Procedures: None  Follow-Up: Your physician wants you to follow-up in: 7 months with Dr Burt Knack. You will receive a reminder letter in the mail two months in advance. If you don't receive a letter, please call our office to schedule the follow-up appointment.

## 2014-12-29 ENCOUNTER — Encounter: Payer: Self-pay | Admitting: Internal Medicine

## 2015-03-15 ENCOUNTER — Encounter: Payer: Self-pay | Admitting: Cardiovascular Disease

## 2015-03-15 ENCOUNTER — Ambulatory Visit (INDEPENDENT_AMBULATORY_CARE_PROVIDER_SITE_OTHER): Payer: Medicare Other | Admitting: Cardiovascular Disease

## 2015-03-15 VITALS — BP 150/74 | HR 57 | Ht 66.0 in | Wt 218.0 lb

## 2015-03-15 DIAGNOSIS — I10 Essential (primary) hypertension: Secondary | ICD-10-CM | POA: Diagnosis not present

## 2015-03-15 DIAGNOSIS — I2510 Atherosclerotic heart disease of native coronary artery without angina pectoris: Secondary | ICD-10-CM | POA: Diagnosis not present

## 2015-03-15 DIAGNOSIS — E785 Hyperlipidemia, unspecified: Secondary | ICD-10-CM | POA: Diagnosis not present

## 2015-03-15 DIAGNOSIS — I6523 Occlusion and stenosis of bilateral carotid arteries: Secondary | ICD-10-CM

## 2015-03-15 MED ORDER — NITROGLYCERIN 0.4 MG SL SUBL: 0.4000 mg | SUBLINGUAL_TABLET | SUBLINGUAL | Status: DC | PRN

## 2015-03-15 NOTE — Patient Instructions (Addendum)
Medication Instructions:  Your physician recommends that you continue on your current medications as directed. Please refer to the Current Medication list given to you today.  Labwork: No new orders.   Testing/Procedures: Your physician has requested that you have a carotid duplex. This test is an ultrasound of the carotid arteries in your neck. It looks at blood flow through these arteries that supply the brain with blood. Allow one hour for this exam. There are no restrictions or special instructions.  Follow-Up: Your physician wants you to follow-up in: 1 YEAR with Dr Cooper. You will receive a reminder letter in the mail two months in advance. If you don't receive a letter, please call our office to schedule the follow-up appointment.   Any Other Special Instructions Will Be Listed Below (If Applicable).     If you need a refill on your cardiac medications before your next appointment, please call your pharmacy.   

## 2015-03-15 NOTE — Progress Notes (Signed)
Cardiology Office Note Date:  03/15/2015   ID:  [name] Ray, DOB 1933/04/02, MRN [id number]  PCP:  Haywood Pao, MD  Cardiologist:  Sherren Mocha, MD    Chief Complaint  Patient presents with  . Follow-up    CAD    History of Present Illness: [name] Ray is a 82 y.o. male who presents for follow-up of CAD. He has previously been followed by Dr [name]. He has CAD and has undergone stenting of the RCA ( bare metal stents), LAD (DES in 2004 and 2006), and LCx (DES in 2006). Last cath in 2011 showed patency of stented segments with moderate ISR and medical therapy was recommended. Other problems include carotid stenosis without stroke, hyperlipidemia, and HTN.   Home BP's generally run 125-135/70's. Labs are followed by Dr Osborne Casco who sees him every 6 months. He has Type II DM and is treated with Saxagliptin-Metformin.   He doesn't engage in regular exercise, but is active with yardwork and has no chest pain or pressure with exertion. He climbs several flights of stairs without dyspnea or chest pain. No lightheadedness or syncope. No orthopnea or PND.  Past Medical History  Diagnosis Date  . GERD (gastroesophageal reflux disease)   . IBS (irritable bowel syndrome)   . Adenomatous polyp     history  . Diverticulosis   . Fatty liver     history  . Dyslipidemia   . Atrial bigeminy     in the past  . Numbness and tingling in hands     resolved  . BPH (benign prostatic hyperplasia)   . Colon polyps   . Overweight(278.02)   . Hypertension     [facility] - 5 stents total , last cardiac cath. 2011.  Cleared for  surgery by Dr. [name]- 04/23/2013  . Carotid artery disease ([place])     a. Doppler, January, 2012, 40-59% bilateral stenoses;  b.  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 Myocardial infarction ([place]) 2002  . Type II diabetes mellitus ([place])   . Arthritis     knees , shoulders, hands   . Prostate cancer ([place])     treated /w   Lupron injection x1, then radium  . Shortness of breath dyspnea   . Coronary artery disease     acute inferior MI Jan 2002 with stent / Taxus stent LAD 2004 / Cypher stent prox LAD and cypher stent mid circ May 2006 / nuclear stress Dec 2009 normal EF 63% / nuclear August 2011 EF 62% / inferior thining and very mild ischemia inferior and apex / catherization Sept 2011 EF 60%  mild to moderate diffuse RCA disease, moderate discrete LAD stenosis before the stent in the proximal LAD med tx rec  . Ejection fraction     Past Surgical History  Procedure Laterality Date  . Neuroplasty / transposition median nerve at carpal tunnel Left     also surgery on thumb, elbow- nerve release   . Prostate biopsy      followed later by [facility]  . Total knee arthroplasty Right 05/24/2013  . Total knee arthroplasty Right 05/24/2013    Procedure: RIGHT TOTAL KNEE ARTHROPLASTY;  Surgeon: [name], MD;  Location: [facility];  Service: Orthopedics;  Laterality: Right;  . Cardiac catheterization      last cath- 2006  . Coronary angioplasty with stent placement      multiple stents in LAD:  "I've got 5  stents" (05/24/2013)  . Total knee arthroplasty Left 02/14/2014    Procedure: LEFT TOTAL KNEE ARTHROPLASTY;  Surgeon: [name], MD;  Location: [facility];  Service: Orthopedics;  Laterality: Left;    Current Outpatient Prescriptions  Medication Sig Dispense Refill  . amLODipine (NORVASC) 5 MG tablet Take 5 mg by mouth at bedtime.     Marland Kitchen aspirin 325 MG EC tablet Take 325 mg by mouth daily.      . clopidogrel (PLAVIX) 75 MG tablet Take 75 mg by mouth daily.      . Cyanocobalamin (VITAMIN B 12 PO) Take 1,000 mcg by mouth daily after breakfast.     . loratadine (CLARITIN) 10 MG tablet Take 10 mg by mouth daily as needed for allergies.     . Multiple Vitamin (MULTIVITAMIN) tablet Take 1 tablet by mouth daily. Men's 50+    . nitroGLYCERIN (NITROSTAT) 0.4 MG SL tablet Place 1 tablet (0.4 mg total) under the tongue every 5 (five)  minutes as needed for chest pain. 25 tablet 2  . omeprazole (PRILOSEC) 20 MG capsule Take 20 mg by mouth daily. Take 30 minutes before meals    . Psyllium (METAMUCIL PO) Take 5 mLs by mouth daily as needed. Sugar free    . ramipril (ALTACE) 10 MG capsule Take 10 mg by mouth daily after breakfast.     . Saxagliptin-Metformin (KOMBIGLYZE XR) 05-998 MG TB24 Take 1 tablet by mouth 2 (two) times daily.    . simvastatin (ZOCOR) 20 MG tablet Take 20 mg by mouth at bedtime.      . tamsulosin (FLOMAX) 0.4 MG CAPS capsule Take 0.4 mg by mouth at bedtime.      No current facility-administered medications for this visit.    Allergies:   Atorvastatin; Rosuvastatin; and Clindamycin   Social History:  The patient  reports that he has never smoked. He has never used smokeless tobacco. He reports that he does not drink alcohol or use illicit drugs.   Family History:  The patient's  family history includes Heart attack in his paternal grandfather; Heart disease in his brother and father; Hypertension in his father.   ROS:  Please see the history of present illness.  Otherwise, review of systems is positive for heartburn.  All other systems are reviewed and negative.   PHYSICAL EXAM: VS:  BP 150/74 mmHg  Pulse 57  Ht 5\' 6"  (1.676 m)  Wt 98.884 kg (218 lb)  BMI 35.20 kg/m2 , BMI Body mass index is 35.2 kg/(m^2). GEN: Well nourished, well developed, in no acute distress HEENT: normal Neck: no JVD, no masses. Bilateral carotid bruits Cardiac: RRR without murmur or gallop                Respiratory:  clear to auscultation bilaterally, normal work of breathing GI: soft, nontender, nondistended, + BS MS: no deformity or atrophy Ext: no pretibial edema, pedal pulses 2+= bilaterally Skin: warm and dry, no rash Neuro:  Strength and sensation are intact Psych: euthymic mood, full affect  EKG:  EKG is ordered today. The ekg ordered today shows sinus bradycardia 57 bpm, otherwise within normal  limits  Recent Labs: No results found for requested labs within last 365 days.   Lipid Panel  No results found for: CHOL, TRIG, HDL, CHOLHDL, VLDL, LDLCALC, LDLDIRECT    Wt Readings from Last 3 Encounters:  03/15/15 98.884 kg (218 lb)  06/20/14 95.618 kg (210 lb 12.8 oz)  05/04/14 95.255 kg (210 lb)  Cardiac Studies Reviewed: Carotid Duplex Apr 22, 2013: 40-59% bilateral ICA stenosis  ASSESSMENT AND PLAN: 1.  CAD, native vessel: no angina at present. He does have some symptoms of indigestion, but nothing associated with physical exertion. I have recommended continuation of current Rx's. With multiple first generation DES and no bleeding problems, I would favor long-term DAPT as tolerated. If he requires temporary interruption of antiplatelet agents for an invasive procedure, this would be acceptable.   2. HTN: BP controlled on current Rx based on home readings. Office BP elevated today but he states this is very unusual for him. I have reviewed office readings available on our flowsheet and BP has been in good range over a period of 5 years.  3. Hyperlipidemia: treated with simvastatin. Lipids followed by PCP.   4. Type II DM: treated with oral hypoglycemic Rx. We discussed diet/exercise/lifestyle modification.   5. Carotid stenosis without hx of stroke: most recent duplex reviewed. Will repeat a duplex scan considering his bilateral bruits on exam.   Current medicines are reviewed with the patient today.  The patient does not have concerns regarding medicines.  Labs/ tests ordered today include:   Orders Placed This Encounter  Procedures  . EKG 12-Lead    Disposition:   FU one year unless problems arise.   Deatra James, MD  03/15/2015 10:06 AM    [address], [place], [place]  [address] Phone: [phone]; Fax: [phone]

## 2015-04-05 ENCOUNTER — Encounter (HOSPITAL_COMMUNITY): Payer: Medicare Other

## 2015-04-06 ENCOUNTER — Ambulatory Visit (HOSPITAL_COMMUNITY)
Admission: RE | Admit: 2015-04-06 | Discharge: 2015-04-06 | Disposition: A | Discharge status: Home / Self Care | Payer: Medicare Other | Source: Ambulatory Visit | Attending: Cardiovascular Disease | Admitting: Cardiovascular Disease

## 2015-04-06 DIAGNOSIS — I252 Old myocardial infarction: Secondary | ICD-10-CM | POA: Insufficient documentation

## 2015-04-06 DIAGNOSIS — I6523 Occlusion and stenosis of bilateral carotid arteries: Secondary | ICD-10-CM

## 2015-04-06 DIAGNOSIS — I10 Essential (primary) hypertension: Secondary | ICD-10-CM | POA: Diagnosis not present

## 2015-04-06 DIAGNOSIS — E119 Type 2 diabetes mellitus without complications: Secondary | ICD-10-CM | POA: Diagnosis not present

## 2015-04-06 DIAGNOSIS — I2510 Atherosclerotic heart disease of native coronary artery without angina pectoris: Secondary | ICD-10-CM | POA: Diagnosis not present

## 2015-04-06 DIAGNOSIS — K219 Gastro-esophageal reflux disease without esophagitis: Secondary | ICD-10-CM | POA: Diagnosis not present

## 2015-04-06 DIAGNOSIS — E785 Hyperlipidemia, unspecified: Secondary | ICD-10-CM | POA: Insufficient documentation

## 2015-04-14 ENCOUNTER — Other Ambulatory Visit: Payer: Self-pay | Admitting: Urology

## 2015-04-14 DIAGNOSIS — R310 Gross hematuria: Secondary | ICD-10-CM

## 2015-04-14 DIAGNOSIS — C61 Malignant neoplasm of prostate: Secondary | ICD-10-CM

## 2015-04-21 ENCOUNTER — Ambulatory Visit
Admission: RE | Admit: 2015-04-21 | Discharge: 2015-04-21 | Disposition: A | Discharge status: Home / Self Care | Payer: Medicare Other | Source: Ambulatory Visit | Attending: Urology | Admitting: Urology

## 2015-04-21 DIAGNOSIS — R310 Gross hematuria: Secondary | ICD-10-CM

## 2015-04-21 DIAGNOSIS — C61 Malignant neoplasm of prostate: Secondary | ICD-10-CM

## 2015-04-21 MED ORDER — IOPAMIDOL (ISOVUE-300) INJECTION 61%
125.0000 mL | Freq: Once | INTRAVENOUS | Status: AC | PRN
  Administered 2015-04-21: 125 mL via INTRAVENOUS

## 2015-08-28 ENCOUNTER — Encounter (HOSPITAL_COMMUNITY): Payer: Self-pay

## 2015-08-28 ENCOUNTER — Emergency Department (HOSPITAL_COMMUNITY)
Admission: EM | Admit: 2015-08-28 | Discharge: 2015-08-28 | Disposition: A | Discharge status: Home / Self Care | Payer: Medicare Other | Attending: Emergency Medicine | Admitting: Emergency Medicine

## 2015-08-28 ENCOUNTER — Emergency Department (HOSPITAL_COMMUNITY): Payer: Medicare Other

## 2015-08-28 DIAGNOSIS — I2510 Atherosclerotic heart disease of native coronary artery without angina pectoris: Secondary | ICD-10-CM | POA: Diagnosis not present

## 2015-08-28 DIAGNOSIS — Y939 Activity, unspecified: Secondary | ICD-10-CM | POA: Diagnosis not present

## 2015-08-28 DIAGNOSIS — S5002XA Contusion of left elbow, initial encounter: Secondary | ICD-10-CM | POA: Diagnosis not present

## 2015-08-28 DIAGNOSIS — Z7982 Long term (current) use of aspirin: Secondary | ICD-10-CM | POA: Diagnosis not present

## 2015-08-28 DIAGNOSIS — Y929 Unspecified place or not applicable: Secondary | ICD-10-CM | POA: Diagnosis not present

## 2015-08-28 DIAGNOSIS — W1802XA Striking against glass with subsequent fall, initial encounter: Secondary | ICD-10-CM | POA: Insufficient documentation

## 2015-08-28 DIAGNOSIS — Y999 Unspecified external cause status: Secondary | ICD-10-CM | POA: Insufficient documentation

## 2015-08-28 DIAGNOSIS — W19XXXA Unspecified fall, initial encounter: Secondary | ICD-10-CM

## 2015-08-28 DIAGNOSIS — S0990XA Unspecified injury of head, initial encounter: Secondary | ICD-10-CM | POA: Diagnosis present

## 2015-08-28 DIAGNOSIS — S40011A Contusion of right shoulder, initial encounter: Secondary | ICD-10-CM | POA: Diagnosis not present

## 2015-08-28 DIAGNOSIS — S060X1A Concussion with loss of consciousness of 30 minutes or less, initial encounter: Secondary | ICD-10-CM

## 2015-08-28 MED ORDER — ACETAMINOPHEN 500 MG PO TABS
500.0000 mg | ORAL_TABLET | Freq: Once | ORAL | Status: AC
  Administered 2015-08-28: 500 mg via ORAL
  Filled 2015-08-28: qty 1

## 2015-08-28 NOTE — ED Notes (Signed)
Patient transported to CT 

## 2015-08-28 NOTE — ED Notes (Signed)
Pt able to ambulate with a steady gait

## 2015-08-28 NOTE — ED Triage Notes (Signed)
Per pt report: pt was visiting a family member here in the hospital when he tripped and fell.  Fall was witnessed by a nurse upstairs.  Pt loss consciousiness for about 30 seconds.  Pt hit his head and has a bump on the left side of his head. Pt takes plavix.  Pt currently a/o x 4.

## 2015-08-28 NOTE — ED Provider Notes (Signed)
[facility] DEPT Provider Note   CSN: [id number] Arrival date & time: 08/28/15  1038     History   Chief Complaint Chief Complaint  Patient presents with  . Fall    HPI [name] Ray is a 82 y.o. male.  The history is provided by the patient and the spouse.  Fall  Pertinent negatives include no chest pain, no abdominal pain, no headaches and no shortness of breath.  Patient presents with a fall. He was up visiting someone in the hospital and fell striking his head and left shoulder. Reportedly was unconscious for around 30 seconds to a couple minutes. Complaining of pain in his left shoulder. Patient does have some difficulty remembering the event but remembers his feet getting tangled up and him falling. He was sent unconscious after striking his head. Patient does not think that he passed out before he fell. He's been doing well the last few days. No chest pain or trouble breathing. No confusion. His been doing well the last few days. Does have some chronic lightheadedness but this is unchanged. He is on Plavix.  Past Medical History:  Diagnosis Date  . Adenomatous polyp    history  . Arthritis    knees , shoulders, hands   . Atrial bigeminy    in the past  . BPH (benign prostatic hyperplasia)   . Carotid artery disease ([place])    a. Doppler, January, 2012, 40-59% bilateral stenoses;  b.  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 Colon polyps   . Coronary artery disease    acute inferior MI Jan 2002 with stent / Taxus stent LAD 2004 / Cypher stent prox LAD and cypher stent mid circ May 2006 / nuclear stress Dec 2009 normal EF 63% / nuclear August 2011 EF 62% / inferior thining and very mild ischemia inferior and apex / catherization Sept 2011 EF 60%  mild to moderate diffuse RCA disease, moderate discrete LAD stenosis before the stent in the proximal LAD med tx rec  . Diverticulosis   . Dyslipidemia   . Ejection fraction   . Fatty liver     history  . GERD (gastroesophageal reflux disease)   . Hypertension    [facility] - 5 stents total , last cardiac cath. 2011.  Cleared for  surgery by Dr. [name]- 04/23/2013  . IBS (irritable bowel syndrome)   . Myocardial infarction ([place]) 2002  . Numbness and tingling in hands    resolved  . Overweight(278.02)   . Prostate cancer ([place])    treated /w  Lupron injection x1, then radium  . Shortness of breath dyspnea   . Type II diabetes mellitus [facility])     Patient Active Problem List   Diagnosis Date Noted  . Episodic lightheadedness 05/04/2014  . Ejection fraction   . S/P total knee arthroplasty 05/24/2013  . Carotid artery disease ([place])   . Coronary artery disease   . Hyperlipidemia   . GERD (gastroesophageal reflux disease)   . Hypertension   . Numbness and tingling in hands   . FECAL OCCULT BLOOD 10/19/2009  . COLONIC POLYPS, ADENOMATOUS, HX OF 10/19/2009  . OVERWEIGHT 11/08/2008  . DM 10/25/2008  . GERD 10/25/2008    Past Surgical History:  Procedure Laterality Date  . CARDIAC CATHETERIZATION     last cath- 2006  . CORONARY ANGIOPLASTY WITH STENT PLACEMENT     multiple stents in LAD:  "I've got 5 stents" (05/24/2013)  .  NEUROPLASTY / TRANSPOSITION MEDIAN NERVE AT CARPAL TUNNEL Left    also surgery on thumb, elbow- nerve release   . PROSTATE BIOPSY     followed later by [facility]  . TOTAL KNEE ARTHROPLASTY Right 05/24/2013  . TOTAL KNEE ARTHROPLASTY Right 05/24/2013   Procedure: RIGHT TOTAL KNEE ARTHROPLASTY;  Surgeon: [name], MD;  Location: [facility];  Service: Orthopedics;  Laterality: Right;  . TOTAL KNEE ARTHROPLASTY Left 02/14/2014   Procedure: LEFT TOTAL KNEE ARTHROPLASTY;  Surgeon: [name], MD;  Location: [facility];  Service: Orthopedics;  Laterality: Left;       Home Medications    Prior to Admission medications   Medication Sig Start Date End Date Taking? Authorizing Provider  amLODipine (NORVASC) 5 MG tablet Take 5 mg by mouth at bedtime.     Historical  Provider, MD  aspirin 325 MG EC tablet Take 325 mg by mouth daily.      Historical Provider, MD  clopidogrel (PLAVIX) 75 MG tablet Take 75 mg by mouth daily.      Historical Provider, MD  Cyanocobalamin (VITAMIN B 12 PO) Take 1,000 mcg by mouth daily after breakfast.     Historical Provider, MD  loratadine (CLARITIN) 10 MG tablet Take 10 mg by mouth daily as needed for allergies.     Historical Provider, MD  Multiple Vitamin (MULTIVITAMIN) tablet Take 1 tablet by mouth daily. Men's 50+    Historical Provider, MD  nitroGLYCERIN (NITROSTAT) 0.4 MG SL tablet Place 1 tablet (0.4 mg total) under the tongue every 5 (five) minutes as needed for chest pain. 03/15/15   Sherren Mocha, MD  omeprazole (PRILOSEC) 20 MG capsule Take 20 mg by mouth daily. Take 30 minutes before meals    Historical Provider, MD  Psyllium (METAMUCIL PO) Take 5 mLs by mouth daily as needed. Sugar free    Historical Provider, MD  ramipril (ALTACE) 10 MG capsule Take 10 mg by mouth daily after breakfast.     Historical Provider, MD  Saxagliptin-Metformin (KOMBIGLYZE XR) 05-998 MG TB24 Take 1 tablet by mouth 2 (two) times daily.    Historical Provider, MD  simvastatin (ZOCOR) 20 MG tablet Take 20 mg by mouth at bedtime.      Historical Provider, MD  tamsulosin (FLOMAX) 0.4 MG CAPS capsule Take 0.4 mg by mouth at bedtime.  04/01/13   Historical Provider, MD    Family History Family History  Problem Relation Age of Onset  . Hypertension Father   . Heart disease Father   . Heart disease Brother   . Heart attack Paternal Grandfather     Social History Social History  Substance Use Topics  . Smoking status: Never Smoker  . Smokeless tobacco: Never Used  . Alcohol use No     Allergies   Atorvastatin; Rosuvastatin; and Clindamycin   Review of Systems Review of Systems  Constitutional: Negative for activity change and appetite change.  Eyes: Negative for pain.  Respiratory: Negative for chest tightness and shortness of  breath.   Cardiovascular: Negative for chest pain and leg swelling.  Gastrointestinal: Negative for abdominal pain, diarrhea, nausea and vomiting.  Genitourinary: Negative for flank pain.  Musculoskeletal: Negative for back pain and neck stiffness.  Skin: Negative for rash.  Neurological: Positive for light-headedness. Negative for weakness, numbness and headaches.  Hematological: Bruises/bleeds easily.  Psychiatric/Behavioral: Negative for behavioral problems.     Physical Exam Updated Vital Signs BP 154/59   Pulse 63   Temp 98 F (36.7 C) (Oral)  Resp 11   Ht 5\' 6"  (1.676 m)   Wt 210 lb (95.3 kg)   SpO2 98%   BMI 33.89 kg/m   Physical Exam  Constitutional: He appears well-developed.  HENT:  Hematoma to left temporal area. Left TM normal.  Eyes: EOM are normal.  Neck: Normal range of motion. Neck supple.  No midline tenderness.  Cardiovascular: Normal rate.   Pulmonary/Chest: Effort normal.  Abdominal: Soft. There is no tenderness.  Musculoskeletal: He exhibits tenderness.  Tenderness to left shoulder anteriorly. Good range of motion. Hematoma to left elbow. Good range of motion. Neurovascular intact in left hand.  Neurological: He is alert.  Skin: Skin is warm. Capillary refill takes less than 2 seconds.  Psychiatric: He has a normal mood and affect.     ED Treatments / Results  Labs (all labs ordered are listed, but only abnormal results are displayed) Labs Reviewed - No data to display  EKG  EKG Interpretation None       Radiology Dg Elbow Complete Left  Result Date: 08/28/2015 CLINICAL DATA:  Fall, left elbow pain EXAM: LEFT ELBOW - COMPLETE 3+ VIEW COMPARISON:  None. FINDINGS: No fracture or dislocation is seen. Mild degenerative changes. The visualized soft tissues are unremarkable. No displaced elbow joint fat pads suggest an elbow joint effusion. IMPRESSION: No fracture or dislocation is seen. Electronically Signed   By: Julian Hy M.D.   On:  08/28/2015 12:06   Ct Head Wo Contrast  Result Date: 08/28/2015 CLINICAL DATA:  Fall today with left-sided head injury and loss of consciousness. History of prostate cancer. EXAM: CT HEAD WITHOUT CONTRAST TECHNIQUE: Contiguous axial images were obtained from the base of the skull through the vertex without intravenous contrast. COMPARISON:  None. FINDINGS: Brain: There is no evidence of acute intracranial hemorrhage, mass lesion, brain edema or extra-axial fluid collection. The ventricles and subarachnoid spaces are appropriately sized for age. There is no CT evidence of acute cortical infarction. There is mild periventricular white matter disease, probably secondary to chronic small vessel ischemic changes. There is minimal asymmetry of the tentorium without definite acute abnormality. Vascular: Prominent intracranial vascular calcifications are noted. Skull: Negative for fracture or focal lesion. Sinuses/Orbits: Mucosal thickening is noted throughout the ethmoid sinuses bilaterally as well as the right frontal and left maxillary sinuses. There are no definite air-fluid levels. The mastoid air cells and middle ears are clear. No orbital abnormalities are apparent. Other: None. IMPRESSION: 1. No acute posttraumatic findings demonstrated. 2. Paranasal sinus mucosal thickening without fluid levels. 3. Mild periventricular white matter disease, probably related to chronic small vessel ischemic changes. Electronically Signed   By: Richardean Sale M.D.   On: 08/28/2015 11:49   Dg Shoulder Left  Result Date: 08/28/2015 CLINICAL DATA:  Fall, left shoulder pain EXAM: LEFT SHOULDER - 2+ VIEW COMPARISON:  None. FINDINGS: No fracture or dislocation is seen. Moderate degenerative changes of the left acromioclavicular joint. Visualized soft tissues are within normal limits. Visualized left lung is clear. IMPRESSION: No fracture or dislocation is seen. Moderate degenerative changes. Electronically Signed   By: Julian Hy M.D.   On: 08/28/2015 12:06    Procedures Procedures (including critical care time)  Medications Ordered in ED Medications  acetaminophen (TYLENOL) tablet 500 mg (500 mg Oral Given 08/28/15 1213)     Initial Impression / Assessment and Plan / ED Course  I have reviewed the triage vital signs and the nursing notes.  Pertinent labs & imaging results that were available  during my care of the patient were reviewed by me and considered in my medical decision making (see chart for details).  Clinical Course    Patient with fall on Plavix. Appears to be mechanical with loss of consciousness after fall, not before. Negative head CT and patient and family members given instructions on what to watch for delayed bleed. Also contusion to elbow and shoulder. At this time will be discharged home. Will not give stronger pain medicines since mental status needs to be monitored.  Final Clinical Impressions(s) / ED Diagnoses   Final diagnoses:  Fall, initial encounter  Concussion, with loss of consciousness of 30 minutes or less, initial encounter  Shoulder contusion, right, initial encounter  Traumatic hematoma of elbow, left, initial encounter    New Prescriptions New Prescriptions   No medications on file     Davonna Belling, MD 08/28/15 1249

## 2015-08-28 NOTE — ED Notes (Signed)
Patient transported to X-ray 

## 2016-04-26 ENCOUNTER — Encounter: Payer: Self-pay | Admitting: Cardiovascular Disease

## 2016-05-07 ENCOUNTER — Other Ambulatory Visit: Payer: Self-pay | Admitting: Cardiovascular Disease

## 2016-05-07 DIAGNOSIS — I6523 Occlusion and stenosis of bilateral carotid arteries: Secondary | ICD-10-CM

## 2016-05-14 ENCOUNTER — Ambulatory Visit (HOSPITAL_COMMUNITY)
Admission: RE | Admit: 2016-05-14 | Discharge: 2016-05-14 | Disposition: A | Discharge status: Home / Self Care | Payer: Medicare Other | Source: Ambulatory Visit | Attending: Internal Medicine | Admitting: Internal Medicine

## 2016-05-14 DIAGNOSIS — I6523 Occlusion and stenosis of bilateral carotid arteries: Secondary | ICD-10-CM | POA: Diagnosis not present

## 2016-05-16 ENCOUNTER — Encounter: Payer: Self-pay | Admitting: Cardiovascular Disease

## 2016-05-16 ENCOUNTER — Ambulatory Visit (INDEPENDENT_AMBULATORY_CARE_PROVIDER_SITE_OTHER): Payer: Medicare Other | Admitting: Cardiovascular Disease

## 2016-05-16 VITALS — BP 118/60 | HR 70 | Ht 66.0 in | Wt 212.4 lb

## 2016-05-16 DIAGNOSIS — I25810 Atherosclerosis of coronary artery bypass graft(s) without angina pectoris: Secondary | ICD-10-CM

## 2016-05-16 DIAGNOSIS — E785 Hyperlipidemia, unspecified: Secondary | ICD-10-CM

## 2016-05-16 DIAGNOSIS — I10 Essential (primary) hypertension: Secondary | ICD-10-CM | POA: Diagnosis not present

## 2016-05-16 MED ORDER — ASPIRIN 81 MG PO TBEC: 81.0000 mg | DELAYED_RELEASE_TABLET | Freq: Every day | ORAL | Status: DC

## 2016-05-16 NOTE — Progress Notes (Signed)
Cardiology Office Note Date:  05/16/2016   ID:  [name] Ray, DOB December 08, 1933, MRN [id number]  PCP:  Haywood Pao, MD  Cardiologist:  Sherren Mocha, MD    Chief Complaint  Patient presents with  . Follow-up     History of Present Illness: [name] Ray is a 83 y.o. male who presents for follow-up of CAD. He has CAD and has undergone stenting of the RCA ( bare metal stents), LAD (DES in 2004 and 2006), and LCx (DES in 2006). Last cath in 2011 showed patency of stented segments with moderate ISR and medical therapy was recommended. Other problems include carotid stenosis without stroke, hyperlipidemia, and HTN.   He is here alone today. Complains of dizziness. Feels like there is a 'pressure' in his head. States that his dizziness was worse about 4 or 5 months ago and seems to have improved since that time. He denies any postural symptoms. He has not had presyncope or syncope. He's had no exertional chest pain or pressure. He occasionally has sharp 20 is of pain in the left chest but these only last a few seconds. No other complaints today. Overall he is able to get out of work in his yard without symptoms. He feels that he is doing reasonably well with no change in symptoms over time.  Past Medical History:  Diagnosis Date  . Adenomatous polyp    history  . Arthritis    knees , shoulders, hands   . Atrial bigeminy    in the past  . BPH (benign prostatic hyperplasia)   . Carotid artery disease ([place])    a. Doppler, January, 2012, 40-59% bilateral stenoses;  b.  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 Colon polyps   . Coronary artery disease    acute inferior MI Jan 2002 with stent / Taxus stent LAD 2004 / Cypher stent prox LAD and cypher stent mid circ May 2006 / nuclear stress Dec 2009 normal EF 63% / nuclear August 2011 EF 62% / inferior thining and very mild ischemia inferior and apex / catherization Sept 2011 EF 60%  mild to moderate  diffuse RCA disease, moderate discrete LAD stenosis before the stent in the proximal LAD med tx rec  . Diverticulosis   . Dyslipidemia   . Ejection fraction   . Fatty liver    history  . GERD (gastroesophageal reflux disease)   . Hypertension    [facility] - 5 stents total , last cardiac cath. 2011.  Cleared for  surgery by Dr. [name]- 04/23/2013  . IBS (irritable bowel syndrome)   . Myocardial infarction ([place]) 2002  . Numbness and tingling in hands    resolved  . Overweight(278.02)   . Prostate cancer ([place])    treated /w  Lupron injection x1, then radium  . Shortness of breath dyspnea   . Type II diabetes mellitus ([place])     Past Surgical History:  Procedure Laterality Date  . CARDIAC CATHETERIZATION     last cath- 2006  . CORONARY ANGIOPLASTY WITH STENT PLACEMENT     multiple stents in LAD:  "I've got 5 stents" (05/24/2013)  . NEUROPLASTY / TRANSPOSITION MEDIAN NERVE AT CARPAL TUNNEL Left    also surgery on thumb, elbow- nerve release   . PROSTATE BIOPSY     followed later by HDR  . TOTAL KNEE ARTHROPLASTY Right 05/24/2013  . TOTAL KNEE ARTHROPLASTY Right 05/24/2013   Procedure: RIGHT  TOTAL KNEE ARTHROPLASTY;  Surgeon: [name], MD;  Location: [facility];  Service: Orthopedics;  Laterality: Right;  . TOTAL KNEE ARTHROPLASTY Left 02/14/2014   Procedure: LEFT TOTAL KNEE ARTHROPLASTY;  Surgeon: [name], MD;  Location: [place];  Service: Orthopedics;  Laterality: Left;    Current Outpatient Prescriptions  Medication Sig Dispense Refill  . amLODipine (NORVASC) 5 MG tablet Take 5 mg by mouth at bedtime.     Marland Kitchen aspirin 81 MG EC tablet Take 1 tablet (81 mg total) by mouth daily.    . clopidogrel (PLAVIX) 75 MG tablet Take 75 mg by mouth daily.      . Cyanocobalamin (VITAMIN B 12 PO) Take 1,000 mcg by mouth daily after breakfast.     . loratadine (CLARITIN) 10 MG tablet Take 10 mg by mouth daily as needed for allergies.     . Multiple Vitamin (MULTIVITAMIN) tablet Take 1 tablet by  mouth daily. Men's 50+    . nitroGLYCERIN (NITROSTAT) 0.4 MG SL tablet Place 1 tablet (0.4 mg total) under the tongue every 5 (five) minutes as needed for chest pain. 25 tablet 2  . omeprazole (PRILOSEC) 20 MG capsule Take 20 mg by mouth daily. Take 30 minutes before meals    . Psyllium (METAMUCIL PO) Take 5 mLs by mouth daily as needed (constipation). Sugar free     . ramipril (ALTACE) 10 MG capsule Take 10 mg by mouth daily after breakfast.     . Saxagliptin-Metformin (KOMBIGLYZE XR) 05-998 MG TB24 Take 1 tablet by mouth 2 (two) times daily.    . simvastatin (ZOCOR) 20 MG tablet Take 20 mg by mouth at bedtime.      . tamsulosin (FLOMAX) 0.4 MG CAPS capsule Take 0.4 mg by mouth at bedtime.      No current facility-administered medications for this visit.     Allergies:   Atorvastatin; Rosuvastatin; and Clindamycin   Social History:  The patient  reports that he has never smoked. He has never used smokeless tobacco. He reports that he does not drink alcohol or use drugs.   Family History:  The patient's  family history includes Heart attack in his paternal grandfather; Heart disease in his brother and father; Hypertension in his father.    ROS:  Please see the history of present illness.  Otherwise, review of systems is positive for dizziness, headaches.  All other systems are reviewed and negative.    PHYSICAL EXAM: VS:  BP 118/60   Pulse 70   Ht 5\' 6"  (1.676 m)   Wt 212 lb 6.4 oz (96.3 kg)   BMI 34.28 kg/m  , BMI Body mass index is 34.28 kg/m. GEN: Well nourished, well developed, overweight male  in no acute distress  HEENT: normal  Neck: no JVD, no masses. No carotid bruits Cardiac: RRR without murmur or gallop                Respiratory:  clear to auscultation bilaterally, normal work of breathing GI: soft, nontender, nondistended, + BS MS: no deformity or atrophy  Ext: no pretibial edema, pedal pulses 2+= bilaterally Skin: warm and dry, no rash Neuro:  Strength and  sensation are intact Psych: euthymic mood, full affect  EKG:  EKG is ordered today. The ekg ordered today shows NSR 70 bpm, frequent PVC's, otherwise normal  Recent Labs: No results found for requested labs within last 8760 hours.   Lipid Panel  No results found for: CHOL, TRIG, HDL, CHOLHDL, VLDL, LDLCALC, LDLDIRECT  Wt Readings from Last 3 Encounters:  05/16/16 212 lb 6.4 oz (96.3 kg)  08/28/15 210 lb (95.3 kg)  03/15/15 218 lb (98.9 kg)     Cardiac Studies Reviewed: Carotid ultrasound 05/14/2016: 1-39% bilateral ICA stenosis. Patent vertebrals bilaterally.  ASSESSMENT AND PLAN: 1.  Coronary artery disease, native vessel: The patient continues on long-term dual antiplatelet therapy with his history of multiple drug-eluting stents. He seems to be tolerating this well, but I recommended that he reduce his aspirin to 81 mg daily.  2. Essential hypertension: Blood pressure is well controlled. His medications are reviewed and no changes are recommended today.  3. Hyperlipidemia: Treated with simvastatin. Labs are followed regularly by his primary physician.  4. Type 2 diabetes: He notes good control of his blood sugars. He continues on oral therapy. Discussed the importance of diet, weight loss, and regular exercise.  5. Carotid stenosis without history of stroke. I reviewed his recent carotid duplex which demonstrates less than 40% bilateral ICA stenosis with heterogenous plaque bilaterally. Clinical follow-up is recommended.  Current medicines are reviewed with the patient today.  The patient does not have concerns regarding medicines.  Labs/ tests ordered today include:   Orders Placed This Encounter  Procedures  . EKG 12-Lead    Disposition:   FU one year  Signed, Sherren Mocha, MD  05/16/2016 3:26 PM    [facility] [place], [place], [place]  [address] Phone: [phone]; Fax: [phone]

## 2016-05-16 NOTE — Patient Instructions (Signed)
Medication Instructions:  Your physician has recommended you make the following change in your medication:  1. DECREASE Aspirin to 81mg  take one tablet by mouth daily  Labwork: No new orders.   Testing/Procedures: No new orders.   Follow-Up: Your physician wants you to follow-up in: 1 YEAR with Dr Burt Knack.  You will receive a reminder letter in the mail two months in advance. If you don't receive a letter, please call our office to schedule the follow-up appointment.   Any Other Special Instructions Will Be Listed Below (If Applicable).     If you need a refill on your cardiac medications before your next appointment, please call your pharmacy.

## 2016-07-19 ENCOUNTER — Other Ambulatory Visit: Payer: Self-pay | Admitting: Orthopedic Surgery

## 2016-07-19 DIAGNOSIS — M5410 Radiculopathy, site unspecified: Secondary | ICD-10-CM

## 2016-07-19 DIAGNOSIS — R531 Weakness: Secondary | ICD-10-CM

## 2016-07-31 ENCOUNTER — Ambulatory Visit
Admission: RE | Admit: 2016-07-31 | Discharge: 2016-07-31 | Disposition: A | Discharge status: Home / Self Care | Payer: Medicare Other | Source: Ambulatory Visit | Attending: Orthopedic Surgery | Admitting: Orthopedic Surgery

## 2016-07-31 DIAGNOSIS — M5410 Radiculopathy, site unspecified: Secondary | ICD-10-CM

## 2016-07-31 DIAGNOSIS — R531 Weakness: Secondary | ICD-10-CM

## 2016-09-13 ENCOUNTER — Telehealth: Payer: Self-pay

## 2016-09-13 NOTE — Telephone Encounter (Signed)
   [facility] Pre-operative Risk Assessment    Request for surgical clearance:  1. What type of surgery is being performed? ESI Lumbar   2. When is this surgery scheduled? 09/25/16   3. Are there any medications that need to be held prior to surgery and how long? ASA/Plavix for 6-7 days  4. Name of physician performing surgery? Marlaine Hind, MD   5. What is your office phone and fax number?  1. Phone: [phone] 2. Fax: [phone]   Theodoro Parma 09/13/2016, 3:47 PM  _______________________________________________________________

## 2016-09-16 NOTE — Telephone Encounter (Signed)
Patient seen in May 2018 and he was stable. That note is reviewed. He has had multivessel stenting done many years ago. He is at low risk of holding aspirin and Plavix for 7 days prior to the procedure. He should start back on these medications postoperatively as soon as possible.

## 2016-09-17 NOTE — Telephone Encounter (Signed)
Faxed to Dr. Brien Few at [phone].

## 2017-04-23 ENCOUNTER — Other Ambulatory Visit: Payer: Self-pay | Admitting: Cardiovascular Disease

## 2017-04-23 DIAGNOSIS — I10 Essential (primary) hypertension: Secondary | ICD-10-CM

## 2017-04-23 DIAGNOSIS — I2510 Atherosclerotic heart disease of native coronary artery without angina pectoris: Secondary | ICD-10-CM

## 2017-04-23 DIAGNOSIS — E785 Hyperlipidemia, unspecified: Secondary | ICD-10-CM

## 2017-04-23 MED ORDER — NITROGLYCERIN 0.4 MG SL SUBL: 0.4000 mg | SUBLINGUAL_TABLET | SUBLINGUAL | 1 refills | Status: AC | PRN

## 2017-07-07 ENCOUNTER — Ambulatory Visit: Payer: Medicare Other | Admitting: Cardiovascular Disease

## 2017-07-07 ENCOUNTER — Encounter: Payer: Self-pay | Admitting: Cardiovascular Disease

## 2017-07-07 VITALS — BP 144/58 | HR 69 | Ht 66.0 in | Wt 193.5 lb

## 2017-07-07 DIAGNOSIS — I2510 Atherosclerotic heart disease of native coronary artery without angina pectoris: Secondary | ICD-10-CM

## 2017-07-07 DIAGNOSIS — E782 Mixed hyperlipidemia: Secondary | ICD-10-CM | POA: Diagnosis not present

## 2017-07-07 DIAGNOSIS — I10 Essential (primary) hypertension: Secondary | ICD-10-CM

## 2017-07-07 NOTE — Patient Instructions (Signed)
Medication Instructions:  1) STOP ASPIRIN  Labwork: None  Testing/Procedures: None  Follow-Up: Your provider wants you to follow-up in: 1 year with Dr. Burt Knack. You will receive a reminder letter in the mail two months in advance. If you don't receive a letter, please call our office to schedule the follow-up appointment.    Any Other Special Instructions Will Be Listed Below (If Applicable). You have been cleared for your surgery! You may HOLD PLAVIX for 5 days prior to your surgery.    If you need a refill on your cardiac medications before your next appointment, please call your pharmacy.

## 2017-07-07 NOTE — Progress Notes (Signed)
Cardiology Office Note Date:  07/07/2017   ID:  [name] Ray, DOB 1933/07/05, MRN [id number]  PCP:  Haywood Pao, MD  Cardiologist:  Sherren Mocha, MD    Chief Complaint  Patient presents with  . Coronary Artery Disease  . Pre-op Exam   History of Present Illness: [name] Ray is a 84 y.o. male who presents for follow-up of CAD. He has CAD and has undergone stenting of the RCA ( bare metal stents), LAD (DES in 2004 and 2006), and LCx (DES in 2006). Last cath in 2011 showed patency of stented segments with moderate ISR and medical therapy was recommended. Other problems include carotid stenosis without stroke, hyperlipidemia, and HTN.   He is here with his wife today. His primary complaint is easy bruising. He is having extensive bruising in his arms and all over, often times without any significant trauma.  From a cardiac perspective he is doing fine with no symptoms of chest pain, shortness of breath, or leg swelling.  He denies heart palpitations, orthopnea, or PND.  His activity is primarily limited by back and leg problems.  His leg gives out occasionally.  He has low back pain.  He is failed conservative therapy and is considering lumbar back surgery with Dr. Arnoldo Morale.  Past Medical History:  Diagnosis Date  . Adenomatous polyp    history  . Arthritis    knees , shoulders, hands   . Atrial bigeminy    in the past  . BPH (benign prostatic hyperplasia)   . Carotid artery disease ([place])    a. Doppler, January, 2012, 40-59% bilateral stenoses;  b.  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 Colon polyps   . Coronary artery disease    acute inferior MI Jan 2002 with stent / Taxus stent LAD 2004 / Cypher stent prox LAD and cypher stent mid circ May 2006 / nuclear stress Dec 2009 normal EF 63% / nuclear August 2011 EF 62% / inferior thining and very mild ischemia inferior and apex / catherization Sept 2011 EF 60%  mild to moderate diffuse RCA  disease, moderate discrete LAD stenosis before the stent in the proximal LAD med tx rec  . Diverticulosis   . Dyslipidemia   . Ejection fraction   . Fatty liver    history  . GERD (gastroesophageal reflux disease)   . Hypertension    [facility] - 5 stents total , last cardiac cath. 2011.  Cleared for  surgery by Dr. [name]- 04/23/2013  . IBS (irritable bowel syndrome)   . Myocardial infarction ([place]) 2002  . Numbness and tingling in hands    resolved  . Overweight(278.02)   . Prostate cancer ([place])    treated /w  Lupron injection x1, then radium  . Shortness of breath dyspnea   . Type II diabetes mellitus ([place])     Past Surgical History:  Procedure Laterality Date  . CARDIAC CATHETERIZATION     last cath- 2006  . CORONARY ANGIOPLASTY WITH STENT PLACEMENT     multiple stents in LAD:  "I've got 5 stents" (05/24/2013)  . NEUROPLASTY / TRANSPOSITION MEDIAN NERVE AT CARPAL TUNNEL Left    also surgery on thumb, elbow- nerve release   . PROSTATE BIOPSY     followed later by HDR  . TOTAL KNEE ARTHROPLASTY Right 05/24/2013  . TOTAL KNEE ARTHROPLASTY Right 05/24/2013   Procedure: RIGHT TOTAL KNEE ARTHROPLASTY;  Surgeon: [name], MD;  Location: [facility];  Service: Orthopedics;  Laterality: Right;  . TOTAL KNEE ARTHROPLASTY Left 02/14/2014   Procedure: LEFT TOTAL KNEE ARTHROPLASTY;  Surgeon: [name], MD;  Location: [facility];  Service: Orthopedics;  Laterality: Left;    Current Outpatient Medications  Medication Sig Dispense Refill  . amLODipine (NORVASC) 5 MG tablet Take 5 mg by mouth at bedtime.     . clopidogrel (PLAVIX) 75 MG tablet Take 75 mg by mouth daily.      . Cyanocobalamin (VITAMIN B 12 PO) Take 1,000 mcg by mouth daily after breakfast.     . loratadine (CLARITIN) 10 MG tablet Take 10 mg by mouth daily as needed for allergies.     . Multiple Vitamin (MULTIVITAMIN) tablet Take 1 tablet by mouth daily. Men's 50+    . nitroGLYCERIN (NITROSTAT) 0.4 MG SL tablet Place 1 tablet (0.4  mg total) under the tongue every 5 (five) minutes as needed for chest pain. Please keep upcoming appt. Thank you 25 tablet 1  . omeprazole (PRILOSEC) 20 MG capsule Take 20 mg by mouth daily. Take 30 minutes before meals    . Psyllium (METAMUCIL PO) Take 5 mLs by mouth daily as needed (constipation). Sugar free     . ramipril (ALTACE) 10 MG capsule Take 10 mg by mouth daily after breakfast.     . Saxagliptin-Metformin (KOMBIGLYZE XR) 05-998 MG TB24 Take 1 tablet by mouth 2 (two) times daily.    . simvastatin (ZOCOR) 20 MG tablet Take 20 mg by mouth at bedtime.      . tamsulosin (FLOMAX) 0.4 MG CAPS capsule Take 0.4 mg by mouth at bedtime.      No current facility-administered medications for this visit.     Allergies:   Atorvastatin; Rosuvastatin; and Clindamycin   Social History:  The patient  reports that he has never smoked. He has never used smokeless tobacco. He reports that he does not drink alcohol or use drugs.   Family History:  The patient's family history includes Heart attack in his paternal grandfather; Heart disease in his brother and father; Hypertension in his father.    ROS:  Please see the history of present illness.  Otherwise, review of systems is positive for back pain, leg weakness.  All other systems are reviewed and negative.    PHYSICAL EXAM: VS:  BP (!) 144/58   Pulse 69   Ht 5\' 6"  (1.676 m)   Wt 193 lb 8 oz (87.8 kg)   SpO2 97%   BMI 31.23 kg/m  , BMI Body mass index is 31.23 kg/m. GEN: Well nourished, well developed, in no acute distress  HEENT: normal  Neck: no JVD, no masses. No carotid bruits Cardiac: RRR without murmur or gallop      Respiratory:  clear to auscultation bilaterally, normal work of breathing GI: soft, nontender, nondistended, + BS MS: no deformity or atrophy  Ext: no pretibial edema, pedal pulses 2+= bilaterally Skin: warm and dry, no rash Neuro:  Strength and sensation are intact Psych: euthymic mood, full affect  EKG:  EKG is  ordered today. The ekg ordered today shows sinus bradycardia 57 bpm, otherwise within normal limits.  Recent Labs: No results found for requested labs within last 8760 hours.   Lipid Panel  No results found for: CHOL, TRIG, HDL, CHOLHDL, VLDL, LDLCALC, LDLDIRECT    Wt Readings from Last 3 Encounters:  07/07/17 193 lb 8 oz (87.8 kg)  05/16/16 212 lb 6.4 oz (96.3 kg)  08/28/15  210 lb (95.3 kg)     ASSESSMENT AND PLAN: 1.  Coronary artery disease, native vessel, without angina: The patient is doing very well.  We discussed long-term issues around dual antiplatelet therapy in the context of his extensive bruising.  Considering all of the data, I recommended that he discontinue aspirin and stay on clopidogrel 75 mg daily.  2.  Hyperlipidemia: He is treated with simvastatin.  His lipids are excellent with an LDL cholesterol of 49 mg/dL.  3.  Hypertension: Blood pressure is controlled on a combination of ramipril and amlodipine.  4.  Type 2 diabetes: Hemoglobin A1c is 5.5.  He is treated with oral hypoglycemic agents and followed by his primary physician. He's done a great job with achieving 19# weight loss over the past year.   6.  Preoperative cardiovascular assessment: The patient is able to achieve a workload greater than 4 metabolic equivalents.  He has no exertional angina, signs of heart failure, or significant arrhythmia.  He can proceed with surgery at low cardiac risk.  He understands there is a small but real risk of ischemic complications related to his multiple stents.  There really is no way to mitigate his risk other than minimizing his time off of antiplatelet therapy.  He is advised it is acceptable to hold clopidogrel for 5 days before surgery and start back on this as soon as possible postoperatively.  He can proceed without further cardiac testing.  Current medicines are reviewed with the patient today.  The patient does not have concerns regarding medicines.  Labs/ tests  ordered today include:   Orders Placed This Encounter  Procedures  . EKG 12-Lead    Disposition:   FU one year  Signed, Sherren Mocha, MD  07/07/2017 1:31 PM    [facility] [place], [place], [place]  [address] Phone: [phone]; Fax: [phone]

## 2017-08-11 ENCOUNTER — Other Ambulatory Visit: Payer: Self-pay | Admitting: Neurosurgery

## 2017-08-27 ENCOUNTER — Other Ambulatory Visit: Payer: Self-pay | Admitting: Neurosurgery

## 2017-09-02 NOTE — Pre-Procedure Instructions (Signed)
[name] Ray  09/02/2017    Your procedure is scheduled on Thursday, September 11, 2017 at 7:30 AM.   Report to [facility] Entrance "A" Admitting Office at 5:30 AM.   Call this number if you have problems the morning of surgery: [phone]   Questions prior to day of surgery, please call [phone] between 8 & 4 PM.   Remember:  Do not eat or drink after midnight Wednesday, 09/10/17.  Take these medicines the morning of surgery with A SIP OF WATER: Omeprazole (Prilosec) - if needed, Loratadine (Claritin) - if needed  Do not take Kombiglyze the morning of surgery.  Stop Plavix (Clopidogrel) 5 days prior to surgery as instructed by Dr. Burt Knack. Stop Multivitamins 7 days prior to surgery. Do not use Aspirin products or NSAIDS (Ibuprofen, Aleve, etc) 7 days prior to surgery.   How to Manage Your Diabetes Before Surgery   Why is it important to control my blood sugar before and after surgery?   Improving blood sugar levels before and after surgery helps healing and can limit problems.  A way of improving blood sugar control is eating a healthy diet by:  - Eating less sugar and carbohydrates  - Increasing activity/exercise  - Talk with your doctor about reaching your blood sugar goals  High blood sugars (greater than 180 mg/dL) can raise your risk of infections and slow down your recovery so you will need to focus on controlling your diabetes during the weeks before surgery.  Make sure that the doctor who takes care of your diabetes knows about your planned surgery including the date and location.  How do I manage my blood sugars before surgery?   Check your blood sugar at least 4 times a day, 2 days before surgery to make sure that they are not too high or low.  Check your blood sugar the morning of your surgery when you wake up and every 2 hours until you get to the Short-Stay unit.  Treat a low blood sugar (less than 70 mg/dL) with 1/2 cup of clear juice  (cranberry or apple), 4 glucose tablets, OR glucose gel.  Recheck blood sugar in 15 minutes after treatment (to make sure it is greater than 70 mg/dL).  If blood sugar is not greater than 70 mg/dL on re-check, call [phone] for further instructions.   Report your blood sugar to the Short-Stay nurse when you get to Short-Stay.  References:  [facility], 2007 "How to Manage your Diabetes Before and After Surgery".   Do not wear jewelry.  Do not wear lotions, powders, cologne or deodorant.  Men may shave face and neck.  Do not bring valuables to the hospital.  [facility] is not responsible for any belongings or valuables.  Contacts, dentures or bridgework may not be worn into surgery.  Leave your suitcase in the car.  After surgery it may be brought to your room.  For patients admitted to the hospital, discharge time will be determined by your treatment team.  Patients discharged the day of surgery will not be allowed to drive home.    - Preparing for Surgery  Before surgery, you can play an important role.  Because skin is not sterile, your skin needs to be as free of germs as possible.  You can reduce the number of germs on you skin by washing with CHG (chlorahexidine gluconate) soap before surgery.  CHG is an antiseptic cleaner which kills germs and bonds with  the skin to continue killing germs even after washing.  Oral Hygiene is also important in reducing the risk of infection.  Remember to brush your teeth with your regular toothpaste the morning of surgery.  Please DO NOT use if you have an allergy to CHG or antibacterial soaps.  If your skin becomes reddened/irritated stop using the CHG and inform your nurse when you arrive at Short Stay.  Do not shave (including legs and underarms) for at least 48 hours prior to the first CHG shower.  You may shave your face.  Please follow these instructions carefully:   1.  Shower with CHG Soap the  night before surgery and the morning of Surgery.  2.  If you choose to wash your hair, wash your hair first as usual with your normal shampoo.  3.  After you shampoo, rinse your hair and body thoroughly to remove the shampoo. 4.  Use CHG as you would any other liquid soap.  You can apply chg directly to the skin and wash gently with a      scrungie or washcloth.           5.  Apply the CHG Soap to your body ONLY FROM THE NECK DOWN.   Do not use on open wounds or open sores. Avoid contact with your eyes, ears, mouth and genitals (private parts).  Wash genitals (private parts) with your normal soap.  6.  Wash thoroughly, paying special attention to the area where your surgery will be performed.  7.  Thoroughly rinse your body with warm water from the neck down.  8.  DO NOT shower/wash with your normal soap after using and rinsing off the CHG Soap.  9.  Pat yourself dry with a clean towel.            10.  Wear clean pajamas.            11.  Place clean sheets on your bed the night of your first shower and do not sleep with pets.  Day of Surgery  Do not apply any lotions/deodorants the morning of surgery.   Please wear clean clothes to the hospital. Remember to brush your teeth with toothpaste.  Please read over the fact sheets that you were given.

## 2017-09-03 ENCOUNTER — Encounter (HOSPITAL_COMMUNITY)
Admission: RE | Admit: 2017-09-03 | Discharge: 2017-09-03 | Disposition: A | Discharge status: Home / Self Care | Payer: Medicare Other | Source: Ambulatory Visit | Attending: Neurosurgery | Admitting: Neurosurgery

## 2017-09-03 ENCOUNTER — Other Ambulatory Visit: Payer: Self-pay

## 2017-09-03 ENCOUNTER — Encounter (HOSPITAL_COMMUNITY): Payer: Self-pay

## 2017-09-03 DIAGNOSIS — Z01818 Encounter for other preprocedural examination: Secondary | ICD-10-CM | POA: Diagnosis not present

## 2017-09-03 DIAGNOSIS — Z79899 Other long term (current) drug therapy: Secondary | ICD-10-CM | POA: Insufficient documentation

## 2017-09-03 DIAGNOSIS — M5416 Radiculopathy, lumbar region: Secondary | ICD-10-CM | POA: Diagnosis not present

## 2017-09-03 LAB — SURGICAL PCR SCREEN
MRSA, PCR: NEGATIVE
Staphylococcus aureus: NEGATIVE

## 2017-09-03 LAB — CBC
HCT: 41.7 % (ref 39.0–52.0)
Hemoglobin: 13.1 g/dL (ref 13.0–17.0)
MCH: 31.4 pg (ref 26.0–34.0)
MCHC: 31.4 g/dL (ref 30.0–36.0)
MCV: 100 fL (ref 78.0–100.0)
Platelets: 151 10*3/uL (ref 150–400)
RBC: 4.17 MIL/uL — ABNORMAL LOW (ref 4.22–5.81)
RDW: 12.8 % (ref 11.5–15.5)
WBC: 6 10*3/uL (ref 4.0–10.5)

## 2017-09-03 LAB — COMPREHENSIVE METABOLIC PANEL
ALT: 8 U/L (ref 0–44)
AST: 27 U/L (ref 15–41)
Albumin: 4.2 g/dL (ref 3.5–5.0)
Alkaline Phosphatase: 44 U/L (ref 38–126)
Anion gap: 10 (ref 5–15)
BUN: 21 mg/dL (ref 8–23)
CO2: 21 mmol/L — ABNORMAL LOW (ref 22–32)
Calcium: 9.2 mg/dL (ref 8.9–10.3)
Chloride: 107 mmol/L (ref 98–111)
Creatinine, Ser: 1.1 mg/dL (ref 0.61–1.24)
GFR calc Af Amer: >60 mL/min (ref >60)
GFR calc non Af Amer: >60 mL/min (ref >60)
Glucose, Bld: 99 mg/dL (ref 70–99)
Potassium: 4.8 mmol/L (ref 3.5–5.1)
Sodium: 138 mmol/L (ref 135–145)
Total Bilirubin: 1.4 mg/dL — ABNORMAL HIGH (ref 0.3–1.2)
Total Protein: 6.8 g/dL (ref 6.5–8.1)

## 2017-09-03 LAB — GLUCOSE, CAPILLARY: Glucose-Capillary: 100 mg/dL — ABNORMAL HIGH (ref 70–99)

## 2017-09-03 NOTE — Progress Notes (Signed)
Pt has hx of CAD with stents back in 2006. Dr. Burt Knack is his cardiologist. Cardiac clearance in Epic dated 07/07/17. Pt denies any recent chest pain or sob. Pt is a type 2 diabetic. Last A1C was 5.3 on 08/04/17. States his fasting blood sugar is usually between 115-125.  Pt states his last dose of Plavix with be 09/05/17.

## 2017-09-10 NOTE — Anesthesia Preprocedure Evaluation (Addendum)
Anesthesia Evaluation  Patient identified by MRN, date of birth, ID band Patient awake    Reviewed: Allergy & Precautions, NPO status , Patient's Chart, lab work & pertinent test results  Airway Mallampati: II  TM Distance: >3 FB Neck ROM: Full    Dental  (+) Dental Advisory Given, Missing, Partial Lower   Pulmonary shortness of breath and with exertion,    breath sounds clear to auscultation       Cardiovascular hypertension, Pt. on medications + CAD, + Past MI, + Cardiac Stents and + Peripheral Vascular Disease   Rhythm:Regular Rate:Normal     Neuro/Psych    GI/Hepatic GERD  Medicated,  Endo/Other  diabetes, Well Controlled, Type 2, Oral Hypoglycemic AgentsMorbid obesity  Renal/GU  Bladder dysfunction      Musculoskeletal  (+) Arthritis ,   Abdominal   Peds  Hematology   Anesthesia Other Findings   Reproductive/Obstetrics                            Anesthesia Physical  Anesthesia Plan  ASA: III  Anesthesia Plan: General   Post-op Pain Management:    Induction: Intravenous  PONV Risk Score and Plan: Ondansetron, Dexamethasone and Treatment may vary due to age or medical condition  Airway Management Planned: Oral ETT  Additional Equipment: Arterial line  Intra-op Plan:   Post-operative Plan: Extubation in OR  Informed Consent: I have reviewed the patients History and Physical, chart, labs and discussed the procedure including the risks, benefits and alternatives for the proposed anesthesia with the patient or authorized representative who has indicated his/her understanding and acceptance.   Dental advisory given  Plan Discussed with: CRNA  Anesthesia Plan Comments:         Anesthesia Quick Evaluation

## 2017-09-11 ENCOUNTER — Ambulatory Visit (HOSPITAL_COMMUNITY): Payer: Medicare Other | Admitting: Certified Registered Nurse Anesthetist

## 2017-09-11 ENCOUNTER — Inpatient Hospital Stay (HOSPITAL_COMMUNITY)
Admission: AD | Admit: 2017-09-11 | Discharge: 2017-09-12 | DRG: 520 | Disposition: A | Discharge status: Home / Self Care | Payer: Medicare Other | Attending: Neurosurgery | Admitting: Neurosurgery

## 2017-09-11 ENCOUNTER — Ambulatory Visit (HOSPITAL_COMMUNITY): Payer: Medicare Other

## 2017-09-11 ENCOUNTER — Inpatient Hospital Stay (HOSPITAL_COMMUNITY)
Admission: AD | Disposition: A | Discharge status: Home / Self Care | Payer: Self-pay | Source: Home / Self Care | Attending: Neurosurgery

## 2017-09-11 DIAGNOSIS — Z961 Presence of intraocular lens: Secondary | ICD-10-CM | POA: Diagnosis present

## 2017-09-11 DIAGNOSIS — E785 Hyperlipidemia, unspecified: Secondary | ICD-10-CM | POA: Diagnosis present

## 2017-09-11 DIAGNOSIS — Z8546 Personal history of malignant neoplasm of prostate: Secondary | ICD-10-CM | POA: Diagnosis not present

## 2017-09-11 DIAGNOSIS — Z955 Presence of coronary angioplasty implant and graft: Secondary | ICD-10-CM | POA: Diagnosis not present

## 2017-09-11 DIAGNOSIS — I2510 Atherosclerotic heart disease of native coronary artery without angina pectoris: Secondary | ICD-10-CM | POA: Diagnosis present

## 2017-09-11 DIAGNOSIS — M5416 Radiculopathy, lumbar region: Secondary | ICD-10-CM | POA: Diagnosis present

## 2017-09-11 DIAGNOSIS — Z9841 Cataract extraction status, right eye: Secondary | ICD-10-CM | POA: Diagnosis not present

## 2017-09-11 DIAGNOSIS — M48062 Spinal stenosis, lumbar region with neurogenic claudication: Secondary | ICD-10-CM | POA: Diagnosis present

## 2017-09-11 DIAGNOSIS — I10 Essential (primary) hypertension: Secondary | ICD-10-CM | POA: Diagnosis present

## 2017-09-11 DIAGNOSIS — K219 Gastro-esophageal reflux disease without esophagitis: Secondary | ICD-10-CM | POA: Diagnosis present

## 2017-09-11 DIAGNOSIS — Z79899 Other long term (current) drug therapy: Secondary | ICD-10-CM | POA: Diagnosis not present

## 2017-09-11 DIAGNOSIS — Z96653 Presence of artificial knee joint, bilateral: Secondary | ICD-10-CM | POA: Diagnosis present

## 2017-09-11 DIAGNOSIS — Z419 Encounter for procedure for purposes other than remedying health state, unspecified: Secondary | ICD-10-CM

## 2017-09-11 DIAGNOSIS — Z6831 Body mass index (BMI) 31.0-31.9, adult: Secondary | ICD-10-CM | POA: Diagnosis not present

## 2017-09-11 DIAGNOSIS — Z8249 Family history of ischemic heart disease and other diseases of the circulatory system: Secondary | ICD-10-CM

## 2017-09-11 DIAGNOSIS — Z888 Allergy status to other drugs, medicaments and biological substances status: Secondary | ICD-10-CM

## 2017-09-11 DIAGNOSIS — Z9842 Cataract extraction status, left eye: Secondary | ICD-10-CM

## 2017-09-11 DIAGNOSIS — E119 Type 2 diabetes mellitus without complications: Secondary | ICD-10-CM | POA: Diagnosis present

## 2017-09-11 DIAGNOSIS — I252 Old myocardial infarction: Secondary | ICD-10-CM | POA: Diagnosis not present

## 2017-09-11 HISTORY — PX: LUMBAR LAMINECTOMY/DECOMPRESSION MICRODISCECTOMY: SHX5026

## 2017-09-11 LAB — GLUCOSE, CAPILLARY
Glucose-Capillary: 98 mg/dL (ref 70–99)
Glucose-Capillary: 124 mg/dL — ABNORMAL HIGH (ref 70–99)
Glucose-Capillary: 122 mg/dL — ABNORMAL HIGH (ref 70–99)
Glucose-Capillary: 177 mg/dL — ABNORMAL HIGH (ref 70–99)
Glucose-Capillary: 136 mg/dL — ABNORMAL HIGH (ref 70–99)

## 2017-09-11 LAB — HEMOGLOBIN A1C
Hgb A1c MFr Bld: 5.4 % (ref 4.8–5.6)
Mean Plasma Glucose: 108.28 mg/dL

## 2017-09-11 SURGERY — LUMBAR LAMINECTOMY/DECOMPRESSION MICRODISCECTOMY 4 LEVEL
Anesthesia: General

## 2017-09-11 MED ORDER — METFORMIN HCL 500 MG PO TABS
1000.0000 mg | ORAL_TABLET | Freq: Two times a day (BID) | ORAL | Status: DC
  Administered 2017-09-11 – 2017-09-12 (×2): 1000 mg via ORAL
  Filled 2017-09-11 (×2): qty 2

## 2017-09-11 MED ORDER — SODIUM CHLORIDE 0.9% FLUSH: 3.0000 mL | INTRAVENOUS | Status: DC | PRN

## 2017-09-11 MED ORDER — MEPERIDINE HCL 50 MG/ML IJ SOLN: 6.2500 mg | INTRAMUSCULAR | Status: DC | PRN

## 2017-09-11 MED ORDER — OXYCODONE HCL 5 MG PO TABS: 10.0000 mg | ORAL_TABLET | ORAL | Status: DC | PRN

## 2017-09-11 MED ORDER — BUPIVACAINE LIPOSOME 1.3 % IJ SUSP
INTRAMUSCULAR | Status: DC | PRN
  Administered 2017-09-11: 20 mL

## 2017-09-11 MED ORDER — LACTATED RINGERS IV SOLN
INTRAVENOUS | Status: DC | PRN
  Administered 2017-09-11 (×2): via INTRAVENOUS

## 2017-09-11 MED ORDER — NITROGLYCERIN 0.4 MG SL SUBL: 0.4000 mg | SUBLINGUAL_TABLET | SUBLINGUAL | Status: DC | PRN

## 2017-09-11 MED ORDER — LIDOCAINE 2% (20 MG/ML) 5 ML SYRINGE
INTRAMUSCULAR | Status: DC | PRN
  Administered 2017-09-11: 100 mg via INTRAVENOUS

## 2017-09-11 MED ORDER — BACITRACIN ZINC 500 UNIT/GM EX OINT
TOPICAL_OINTMENT | CUTANEOUS | Status: AC
  Filled 2017-09-11: qty 28.35

## 2017-09-11 MED ORDER — MENTHOL 3 MG MT LOZG: 1.0000 | LOZENGE | OROMUCOSAL | Status: DC | PRN

## 2017-09-11 MED ORDER — LORATADINE 10 MG PO TABS: 10.0000 mg | ORAL_TABLET | Freq: Every day | ORAL | Status: DC | PRN

## 2017-09-11 MED ORDER — DOCUSATE SODIUM 100 MG PO CAPS
100.0000 mg | ORAL_CAPSULE | Freq: Two times a day (BID) | ORAL | Status: DC
  Administered 2017-09-11 – 2017-09-12 (×3): 100 mg via ORAL
  Filled 2017-09-11 (×3): qty 1

## 2017-09-11 MED ORDER — SODIUM CHLORIDE 0.9 % IV SOLN: 250.0000 mL | INTRAVENOUS | Status: DC

## 2017-09-11 MED ORDER — ACETAMINOPHEN 325 MG PO TABS: 650.0000 mg | ORAL_TABLET | ORAL | Status: DC | PRN

## 2017-09-11 MED ORDER — 0.9 % SODIUM CHLORIDE (POUR BTL) OPTIME
TOPICAL | Status: DC | PRN
  Administered 2017-09-11: 1000 mL

## 2017-09-11 MED ORDER — SODIUM CHLORIDE 0.9% FLUSH: 3.0000 mL | Freq: Two times a day (BID) | INTRAVENOUS | Status: DC

## 2017-09-11 MED ORDER — CEFAZOLIN SODIUM-DEXTROSE 2-4 GM/100ML-% IV SOLN
INTRAVENOUS | Status: AC
  Filled 2017-09-11: qty 100

## 2017-09-11 MED ORDER — BUPIVACAINE-EPINEPHRINE (PF) 0.5% -1:200000 IJ SOLN
INTRAMUSCULAR | Status: DC | PRN
  Administered 2017-09-11: 10 mL

## 2017-09-11 MED ORDER — ONDANSETRON HCL 4 MG/2ML IJ SOLN
INTRAMUSCULAR | Status: DC | PRN
  Administered 2017-09-11: 4 mg via INTRAVENOUS

## 2017-09-11 MED ORDER — CYCLOBENZAPRINE HCL 10 MG PO TABS: 10.0000 mg | ORAL_TABLET | Freq: Three times a day (TID) | ORAL | Status: DC | PRN

## 2017-09-11 MED ORDER — CEFAZOLIN SODIUM-DEXTROSE 2-4 GM/100ML-% IV SOLN: 2.0000 g | INTRAVENOUS | Status: DC

## 2017-09-11 MED ORDER — ONDANSETRON HCL 4 MG/2ML IJ SOLN: 4.0000 mg | Freq: Four times a day (QID) | INTRAMUSCULAR | Status: DC | PRN

## 2017-09-11 MED ORDER — SUGAMMADEX SODIUM 200 MG/2ML IV SOLN
INTRAVENOUS | Status: DC | PRN
  Administered 2017-09-11: 180 mg via INTRAVENOUS

## 2017-09-11 MED ORDER — SAXAGLIPTIN-METFORMIN ER 2.5-1000 MG PO TB24: 1.0000 | ORAL_TABLET | Freq: Two times a day (BID) | ORAL | Status: DC

## 2017-09-11 MED ORDER — BACITRACIN ZINC 500 UNIT/GM EX OINT
TOPICAL_OINTMENT | CUTANEOUS | Status: DC | PRN
  Administered 2017-09-11: 1 via TOPICAL

## 2017-09-11 MED ORDER — AMLODIPINE BESYLATE 5 MG PO TABS
5.0000 mg | ORAL_TABLET | Freq: Every day | ORAL | Status: DC
  Filled 2017-09-11: qty 1

## 2017-09-11 MED ORDER — BUPIVACAINE LIPOSOME 1.3 % IJ SUSP
20.0000 mL | Freq: Once | INTRAMUSCULAR | Status: DC
  Filled 2017-09-11: qty 20

## 2017-09-11 MED ORDER — SIMVASTATIN 20 MG PO TABS
20.0000 mg | ORAL_TABLET | Freq: Every day | ORAL | Status: DC
  Administered 2017-09-11: 20 mg via ORAL
  Filled 2017-09-11: qty 1

## 2017-09-11 MED ORDER — ACETAMINOPHEN 650 MG RE SUPP: 650.0000 mg | RECTAL | Status: DC | PRN

## 2017-09-11 MED ORDER — DEXAMETHASONE SODIUM PHOSPHATE 10 MG/ML IJ SOLN
INTRAMUSCULAR | Status: DC | PRN
  Administered 2017-09-11: 4 mg via INTRAVENOUS

## 2017-09-11 MED ORDER — HYDROMORPHONE HCL 1 MG/ML IJ SOLN
INTRAMUSCULAR | Status: AC
  Filled 2017-09-11: qty 1

## 2017-09-11 MED ORDER — CEFAZOLIN SODIUM-DEXTROSE 2-3 GM-%(50ML) IV SOLR
INTRAVENOUS | Status: DC | PRN
  Administered 2017-09-11: 2 g via INTRAVENOUS

## 2017-09-11 MED ORDER — PROPOFOL 10 MG/ML IV BOLUS
INTRAVENOUS | Status: AC
  Filled 2017-09-11: qty 20

## 2017-09-11 MED ORDER — ARTIFICIAL TEARS OPHTHALMIC OINT
TOPICAL_OINTMENT | OPHTHALMIC | Status: AC
  Filled 2017-09-11: qty 3.5

## 2017-09-11 MED ORDER — ONDANSETRON HCL 4 MG/2ML IJ SOLN
INTRAMUSCULAR | Status: AC
  Filled 2017-09-11: qty 2

## 2017-09-11 MED ORDER — ROCURONIUM BROMIDE 10 MG/ML (PF) SYRINGE
PREFILLED_SYRINGE | INTRAVENOUS | Status: DC | PRN
  Administered 2017-09-11: 20 mg via INTRAVENOUS
  Administered 2017-09-11: 50 mg via INTRAVENOUS
  Administered 2017-09-11: 20 mg via INTRAVENOUS

## 2017-09-11 MED ORDER — DEXAMETHASONE SODIUM PHOSPHATE 10 MG/ML IJ SOLN
INTRAMUSCULAR | Status: AC
  Filled 2017-09-11: qty 1

## 2017-09-11 MED ORDER — INSULIN ASPART 100 UNIT/ML ~~LOC~~ SOLN: 0.0000 [IU] | SUBCUTANEOUS | Status: DC

## 2017-09-11 MED ORDER — THROMBIN 20000 UNITS EX SOLR
CUTANEOUS | Status: DC | PRN
  Administered 2017-09-11: 20000 [IU] via TOPICAL

## 2017-09-11 MED ORDER — CEFAZOLIN SODIUM-DEXTROSE 2-4 GM/100ML-% IV SOLN
2.0000 g | Freq: Three times a day (TID) | INTRAVENOUS | Status: AC
  Administered 2017-09-11 (×2): 2 g via INTRAVENOUS
  Filled 2017-09-11 (×2): qty 100

## 2017-09-11 MED ORDER — TAMSULOSIN HCL 0.4 MG PO CAPS
0.4000 mg | ORAL_CAPSULE | Freq: Every day | ORAL | Status: DC
  Administered 2017-09-11: 0.4 mg via ORAL
  Filled 2017-09-11: qty 1

## 2017-09-11 MED ORDER — ONDANSETRON HCL 4 MG PO TABS: 4.0000 mg | ORAL_TABLET | Freq: Four times a day (QID) | ORAL | Status: DC | PRN

## 2017-09-11 MED ORDER — FENTANYL CITRATE (PF) 250 MCG/5ML IJ SOLN
INTRAMUSCULAR | Status: AC
  Filled 2017-09-11: qty 5

## 2017-09-11 MED ORDER — HYDROMORPHONE HCL 1 MG/ML IJ SOLN
0.2500 mg | INTRAMUSCULAR | Status: DC | PRN
  Administered 2017-09-11 (×2): 0.5 mg via INTRAVENOUS

## 2017-09-11 MED ORDER — PHENOL 1.4 % MT LIQD: 1.0000 | OROMUCOSAL | Status: DC | PRN

## 2017-09-11 MED ORDER — RAMIPRIL 10 MG PO CAPS
10.0000 mg | ORAL_CAPSULE | Freq: Every day | ORAL | Status: DC
  Administered 2017-09-12: 10 mg via ORAL
  Filled 2017-09-11: qty 1

## 2017-09-11 MED ORDER — PROMETHAZINE HCL 25 MG/ML IJ SOLN: 6.2500 mg | INTRAMUSCULAR | Status: DC | PRN

## 2017-09-11 MED ORDER — EPHEDRINE SULFATE 50 MG/ML IJ SOLN
INTRAMUSCULAR | Status: DC | PRN
  Administered 2017-09-11 (×3): 5 mg via INTRAVENOUS

## 2017-09-11 MED ORDER — CHLORHEXIDINE GLUCONATE CLOTH 2 % EX PADS: 6.0000 | MEDICATED_PAD | Freq: Once | CUTANEOUS | Status: DC

## 2017-09-11 MED ORDER — HEMOSTATIC AGENTS (NO CHARGE) OPTIME
TOPICAL | Status: DC | PRN
  Administered 2017-09-11 (×2): 1 via TOPICAL

## 2017-09-11 MED ORDER — ACETAMINOPHEN 500 MG PO TABS
1000.0000 mg | ORAL_TABLET | Freq: Four times a day (QID) | ORAL | Status: AC
  Administered 2017-09-11 – 2017-09-12 (×4): 1000 mg via ORAL
  Filled 2017-09-11 (×4): qty 2

## 2017-09-11 MED ORDER — LINAGLIPTIN 5 MG PO TABS
5.0000 mg | ORAL_TABLET | Freq: Every day | ORAL | Status: DC
  Administered 2017-09-11 – 2017-09-12 (×2): 5 mg via ORAL
  Filled 2017-09-11 (×2): qty 1

## 2017-09-11 MED ORDER — INSULIN ASPART 100 UNIT/ML ~~LOC~~ SOLN: 0.0000 [IU] | Freq: Three times a day (TID) | SUBCUTANEOUS | Status: DC

## 2017-09-11 MED ORDER — BUPIVACAINE-EPINEPHRINE (PF) 0.5% -1:200000 IJ SOLN
INTRAMUSCULAR | Status: AC
  Filled 2017-09-11: qty 30

## 2017-09-11 MED ORDER — PROPOFOL 10 MG/ML IV BOLUS
INTRAVENOUS | Status: DC | PRN
  Administered 2017-09-11: 140 mg via INTRAVENOUS

## 2017-09-11 MED ORDER — OXYCODONE HCL 5 MG PO TABS
5.0000 mg | ORAL_TABLET | ORAL | Status: DC | PRN
  Administered 2017-09-11 – 2017-09-12 (×6): 5 mg via ORAL
  Filled 2017-09-11 (×6): qty 1

## 2017-09-11 MED ORDER — ARTIFICIAL TEARS OPHTHALMIC OINT
TOPICAL_OINTMENT | OPHTHALMIC | Status: DC | PRN
  Administered 2017-09-11: 1 via OPHTHALMIC

## 2017-09-11 MED ORDER — FENTANYL CITRATE (PF) 250 MCG/5ML IJ SOLN
INTRAMUSCULAR | Status: DC | PRN
  Administered 2017-09-11 (×2): 50 ug via INTRAVENOUS
  Administered 2017-09-11: 100 ug via INTRAVENOUS
  Administered 2017-09-11: 50 ug via INTRAVENOUS

## 2017-09-11 MED ORDER — MORPHINE SULFATE (PF) 4 MG/ML IV SOLN: 4.0000 mg | INTRAVENOUS | Status: DC | PRN

## 2017-09-11 MED ORDER — SODIUM CHLORIDE 0.9 % IV SOLN
INTRAVENOUS | Status: DC | PRN
  Administered 2017-09-11: 09:00:00

## 2017-09-11 MED ORDER — PANTOPRAZOLE SODIUM 40 MG PO TBEC
40.0000 mg | DELAYED_RELEASE_TABLET | Freq: Every day | ORAL | Status: DC
  Administered 2017-09-11 – 2017-09-12 (×2): 40 mg via ORAL
  Filled 2017-09-11 (×2): qty 1

## 2017-09-11 MED ORDER — THROMBIN (RECOMBINANT) 20000 UNITS EX SOLR
CUTANEOUS | Status: AC
  Filled 2017-09-11: qty 20000

## 2017-09-11 MED ORDER — LIDOCAINE 2% (20 MG/ML) 5 ML SYRINGE
INTRAMUSCULAR | Status: AC
  Filled 2017-09-11: qty 5

## 2017-09-11 MED ORDER — BISACODYL 10 MG RE SUPP: 10.0000 mg | Freq: Every day | RECTAL | Status: DC | PRN

## 2017-09-11 SURGICAL SUPPLY — 56 items
BAG DECANTER FOR FLEXI CONT (MISCELLANEOUS) ×2 IMPLANT
BENZOIN TINCTURE PRP APPL 2/3 (GAUZE/BANDAGES/DRESSINGS) ×2 IMPLANT
BLADE CLIPPER SURG (BLADE) IMPLANT
BUR MATCHSTICK NEURO 3.0 LAGG (BURR) ×2 IMPLANT
BUR PRECISION FLUTE 6.0 (BURR) ×2 IMPLANT
CANISTER SUCT 3000ML PPV (MISCELLANEOUS) ×2 IMPLANT
CARTRIDGE OIL MAESTRO DRILL (MISCELLANEOUS) ×1 IMPLANT
DIFFUSER DRILL AIR PNEUMATIC (MISCELLANEOUS) ×2 IMPLANT
DRAPE LAPAROTOMY 100X72X124 (DRAPES) ×2 IMPLANT
DRAPE MICROSCOPE LEICA (MISCELLANEOUS) IMPLANT
DRAPE POUCH INSTRU U-SHP 10X18 (DRAPES) ×2 IMPLANT
DRAPE SURG 17X23 STRL (DRAPES) ×8 IMPLANT
DRSG OPSITE 4X5.5 SM (GAUZE/BANDAGES/DRESSINGS) ×2 IMPLANT
DRSG OPSITE POSTOP 4X8 (GAUZE/BANDAGES/DRESSINGS) ×4 IMPLANT
ELECT BLADE 4.0 EZ CLEAN MEGAD (MISCELLANEOUS) ×2
ELECT REM PT RETURN 9FT ADLT (ELECTROSURGICAL) ×2
ELECTRODE BLDE 4.0 EZ CLN MEGD (MISCELLANEOUS) ×1 IMPLANT
ELECTRODE REM PT RTRN 9FT ADLT (ELECTROSURGICAL) ×1 IMPLANT
EVACUATOR 1/8 PVC DRAIN (DRAIN) ×2 IMPLANT
GAUZE 4X4 16PLY RFD (DISPOSABLE) IMPLANT
GAUZE SPONGE 4X4 12PLY STRL (GAUZE/BANDAGES/DRESSINGS) ×2 IMPLANT
GLOVE BIO SURGEON STRL SZ 6.5 (GLOVE) ×2 IMPLANT
GLOVE BIO SURGEON STRL SZ8 (GLOVE) ×4 IMPLANT
GLOVE BIO SURGEON STRL SZ8.5 (GLOVE) ×2 IMPLANT
GLOVE BIOGEL PI IND STRL 6.5 (GLOVE) ×1 IMPLANT
GLOVE BIOGEL PI INDICATOR 6.5 (GLOVE) ×1
GLOVE EXAM NITRILE LRG STRL (GLOVE) IMPLANT
GLOVE EXAM NITRILE XL STR (GLOVE) IMPLANT
GLOVE EXAM NITRILE XS STR PU (GLOVE) IMPLANT
GLOVE SURG SS PI 7.5 STRL IVOR (GLOVE) ×4 IMPLANT
GOWN STRL REUS W/ TWL LRG LVL3 (GOWN DISPOSABLE) ×2 IMPLANT
GOWN STRL REUS W/ TWL XL LVL3 (GOWN DISPOSABLE) ×2 IMPLANT
GOWN STRL REUS W/TWL 2XL LVL3 (GOWN DISPOSABLE) IMPLANT
GOWN STRL REUS W/TWL LRG LVL3 (GOWN DISPOSABLE) ×2
GOWN STRL REUS W/TWL XL LVL3 (GOWN DISPOSABLE) ×2
KIT BASIN OR (CUSTOM PROCEDURE TRAY) ×2 IMPLANT
KIT TURNOVER KIT B (KITS) ×2 IMPLANT
NEEDLE HYPO 21X1.5 SAFETY (NEEDLE) ×2 IMPLANT
NEEDLE HYPO 22GX1.5 SAFETY (NEEDLE) ×2 IMPLANT
NS IRRIG 1000ML POUR BTL (IV SOLUTION) ×2 IMPLANT
OIL CARTRIDGE MAESTRO DRILL (MISCELLANEOUS) ×2
PACK LAMINECTOMY NEURO (CUSTOM PROCEDURE TRAY) ×2 IMPLANT
PAD ARMBOARD 7.5X6 YLW CONV (MISCELLANEOUS) ×6 IMPLANT
PATTIES SURGICAL .5 X1 (DISPOSABLE) IMPLANT
RUBBERBAND STERILE (MISCELLANEOUS) ×4 IMPLANT
SPONGE SURGIFOAM ABS GEL 100 (HEMOSTASIS) ×2 IMPLANT
STRIP CLOSURE SKIN 1/2X4 (GAUZE/BANDAGES/DRESSINGS) ×2 IMPLANT
SURGIFLO W/THROMBIN 8M KIT (HEMOSTASIS) ×2 IMPLANT
SUT VIC AB 1 CT1 18XBRD ANBCTR (SUTURE) ×2 IMPLANT
SUT VIC AB 1 CT1 8-18 (SUTURE) ×2
SUT VIC AB 2-0 CP2 18 (SUTURE) ×4 IMPLANT
SYRINGE 20CC LL (MISCELLANEOUS) ×2 IMPLANT
TOWEL GREEN STERILE (TOWEL DISPOSABLE) ×2 IMPLANT
TOWEL GREEN STERILE FF (TOWEL DISPOSABLE) ×2 IMPLANT
TRAY FOLEY CATH 16FRSI W/METER (SET/KITS/TRAYS/PACK) ×2 IMPLANT
WATER STERILE IRR 1000ML POUR (IV SOLUTION) ×2 IMPLANT

## 2017-09-11 NOTE — Progress Notes (Signed)
Subjective: The patient is alert and pleasant.  He looks well.  His wife and son are at the bedside.  Objective: Vital signs in last 24 hours: Temp:  [97.2 F (36.2 C)-98 F (36.7 C)] 97.6 F (36.4 C) (08/29 1244) Pulse Rate:  [55-67] 67 (08/29 1244) Resp:  [8-20] 20 (08/29 1244) BP: (108-139)/(44-56) 108/49 (08/29 1244) SpO2:  [96 %-100 %] 96 % (08/29 1244) Estimated body mass index is 31.85 kg/m as calculated from the following:   Height as of 09/03/17: 5\' 5"  (1.651 m).   Weight as of 09/03/17: 86.8 kg.   Intake/Output from previous day: No intake/output data recorded. Intake/Output this shift: Total I/O In: 1000 [I.V.:1000] Out: 550 [Urine:275; Drains:75; Blood:200]  Physical exam the patient is alert and pleasant.  He is moving his lower extremities well.  Lab Results: No results for input(s): WBC, HGB, HCT, PLT in the last 72 hours. BMET No results for input(s): NA, K, CL, CO2, GLUCOSE, BUN, CREATININE, CALCIUM in the last 72 hours.  Studies/Results: Dg Lumbar Spine 1 View  Result Date: 09/11/2017 CLINICAL DATA:  Intraoperative localization EXAM: LUMBAR SPINE - 1 VIEW COMPARISON:  08/07/2017 FINDINGS: Lateral view of the lumbar spine reveals surgical retractors and instruments at the L4-5 interspace. Numbering nomenclature similar to that utilized on prior MRI. IMPRESSION: Intraoperative localization at L4-5. Electronically Signed   By: Inez Catalina M.D.   On: 09/11/2017 12:53    Assessment/Plan: The patient is doing well.  LOS: 0 days     Ophelia Charter 09/11/2017, 3:09 PM

## 2017-09-11 NOTE — H&P (Signed)
Subjective: The patient is an 84 year old white male who has complained of back, buttock and leg pain consistent with neurogenic claudication.  He has failed medical management and was worked up with a lumbar MRI.  This demonstrated lumbar spinal stenosis.  I discussed the various treatment options with the patient and his wife he has decided to proceed with surgery.  Past Medical History:  Diagnosis Date  . Adenomatous polyp    history  . Arthritis    knees , shoulders, hands   . Atrial bigeminy    in the past  . BPH (benign prostatic hyperplasia)   . Carotid artery disease ([place])    a. Doppler, January, 2012, 40-59% bilateral stenoses;  b.  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 Colon polyps   . Coronary artery disease    acute inferior MI Jan 2002 with stent / Taxus stent LAD 2004 / Cypher stent prox LAD and cypher stent mid circ May 2006 / nuclear stress Dec 2009 normal EF 63% / nuclear August 2011 EF 62% / inferior thining and very mild ischemia inferior and apex / catherization Sept 2011 EF 60%  mild to moderate diffuse RCA disease, moderate discrete LAD stenosis before the stent in the proximal LAD med tx rec  . Diverticulosis   . Dyslipidemia   . Ejection fraction   . Fatty liver    history  . GERD (gastroesophageal reflux disease)   . Hypertension    [facility] - 5 stents total , last cardiac cath. 2011.  Cleared for  surgery by Dr. [name]- 04/23/2013  . IBS (irritable bowel syndrome)   . Myocardial infarction ([place]) 2002  . Numbness and tingling in hands    resolved  . Overweight(278.02)   . Prostate cancer ([place])    treated /w  Lupron injection x1, then radium  . Shortness of breath dyspnea   . Type II diabetes mellitus ([place])     Past Surgical History:  Procedure Laterality Date  . CARDIAC CATHETERIZATION     last cath- 2006  . COLONOSCOPY    . CORONARY ANGIOPLASTY WITH STENT PLACEMENT     multiple stents in LAD:  "I've got 5  stents" (05/24/2013)  . EYE SURGERY Bilateral    cataract surgery with lens implants  . NEUROPLASTY / TRANSPOSITION MEDIAN NERVE AT CARPAL TUNNEL Left    also surgery on thumb, elbow- nerve release   . PROSTATE BIOPSY     followed later by HDR  . TOTAL KNEE ARTHROPLASTY Right 05/24/2013  . TOTAL KNEE ARTHROPLASTY Right 05/24/2013   Procedure: RIGHT TOTAL KNEE ARTHROPLASTY;  Surgeon: [name], MD;  Location: [facility];  Service: Orthopedics;  Laterality: Right;  . TOTAL KNEE ARTHROPLASTY Left 02/14/2014   Procedure: LEFT TOTAL KNEE ARTHROPLASTY;  Surgeon: [name], MD;  Location: [facility];  Service: Orthopedics;  Laterality: Left;    Allergies  Allergen Reactions  . Atorvastatin Other (See Comments)    SEVERE LEG PAIN Can not walk  . Rosuvastatin Other (See Comments)    JOINT PAIN  Makes knees hurt   . Clindamycin Rash    PATIENT REFUSES    Social History   Tobacco Use  . Smoking status: Never Smoker  . Smokeless tobacco: Never Used  Substance Use Topics  . Alcohol use: No    Family History  Problem Relation Age of Onset  . Hypertension Father   . Heart disease Father   . Heart  disease Brother   . Heart attack Paternal Grandfather    Prior to Admission medications   Medication Sig Start Date End Date Taking? Authorizing Provider  acetaminophen (TYLENOL) 650 MG CR tablet Take 650 mg by mouth every 8 (eight) hours as needed for pain.   Yes [provider]  amLODipine (NORVASC) 5 MG tablet Take 5 mg by mouth at bedtime.    Yes [provider]  clopidogrel (PLAVIX) 75 MG tablet Take 75 mg by mouth daily.     Yes [provider]  KOMBIGLYZE XR 2.05-998 MG TB24 Take 1 tablet by mouth 2 (two) times daily. 08/04/17  Yes [provider]  loperamide (IMODIUM A-D) 2 MG tablet Take 2 mg by mouth 4 (four) times daily as needed for diarrhea or loose stools.   Yes [provider]  loratadine (CLARITIN) 10 MG tablet Take 10 mg by mouth daily as  needed for allergies.    Yes [provider]  Multiple Vitamin (MULTIVITAMIN WITH MINERALS) TABS tablet Take 1 tablet by mouth daily. Men's One-A-Day 50+   Yes [provider]  ramipril (ALTACE) 10 MG capsule Take 10 mg by mouth daily after breakfast.    Yes [provider]  simvastatin (ZOCOR) 20 MG tablet Take 20 mg by mouth at bedtime.     Yes [provider]  tamsulosin (FLOMAX) 0.4 MG CAPS capsule Take 0.4 mg by mouth at bedtime.  04/01/13  Yes [provider]  vitamin B-12 (CYANOCOBALAMIN) 1000 MCG tablet Take 1,000 mcg by mouth daily.   Yes [provider]  amoxicillin (AMOXIL) 500 MG capsule Take 2,000 mg by mouth See admin instructions. Take 4 capsules (2000 mg) by mouth 1 prior to dental appointments.    [provider]  nitroGLYCERIN (NITROSTAT) 0.4 MG SL tablet Place 1 tablet (0.4 mg total) under the tongue every 5 (five) minutes as needed for chest pain. Please keep upcoming appt. Thank you 04/23/17   Sherren Mocha, MD  omeprazole (PRILOSEC) 20 MG capsule Take 20 mg by mouth daily as needed (for acid reflux or indigestion).     [provider]     Review of Systems  Positive ROS: As above  All other systems have been reviewed and were otherwise negative with the exception of those mentioned in the HPI and as above.  Objective: Vital signs in last 24 hours: Temp:  [98 F (36.7 C)] 98 F (36.7 C) (08/29 2376) Pulse Rate:  [63] 63 (08/29 0614) Resp:  [20] 20 (08/29 0614) BP: (139)/(55) 139/55 (08/29 0614) SpO2:  [99 %] 99 % (08/29 0614) Estimated body mass index is 31.85 kg/m as calculated from the following:   Height as of 09/03/17: 5\' 5"  (1.651 m).   Weight as of 09/03/17: 86.8 kg.   General Appearance: Alert Head: Normocephalic, without obvious abnormality, atraumatic Eyes: PERRL, conjunctiva/corneas clear, EOM's intact,    Ears: Normal  Throat: Normal  Neck: Supple, Back: unremarkable Lungs:  Clear to auscultation bilaterally, respirations unlabored Heart: Regular rate and rhythm, no murmur, rub or gallop Abdomen: Soft, non-tender Extremities: Extremities normal, atraumatic, no cyanosis or edema Skin: unremarkable  NEUROLOGIC:   Mental status: alert and oriented,Motor Exam - grossly normal Sensory Exam - grossly normal Reflexes:  Coordination - grossly normal Gait - grossly normal Balance - grossly normal Cranial Nerves: I: smell Not tested  II: visual acuity  OS: Normal  OD: Normal   II: visual fields Full to confrontation  II: pupils Equal, round,  reactive to light  III,VII: ptosis None  III,IV,VI: extraocular muscles  Full ROM  V: mastication Normal  V: facial light touch sensation  Normal  V,VII: corneal reflex  Present  VII: facial muscle function - upper  Normal  VII: facial muscle function - lower Normal  VIII: hearing Not tested  IX: soft palate elevation  Normal  IX,X: gag reflex Present  XI: trapezius strength  5/5  XI: sternocleidomastoid strength 5/5  XI: neck flexion strength  5/5  XII: tongue strength  Normal    Data Review Lab Results  Component Value Date   WBC 6.0 09/03/2017   HGB 13.1 09/03/2017   HCT 41.7 09/03/2017   MCV 100.0 09/03/2017   PLT 151 09/03/2017   Lab Results  Component Value Date   NA 138 09/03/2017   K 4.8 09/03/2017   CL 107 09/03/2017   CO2 21 (L) 09/03/2017   BUN 21 09/03/2017   CREATININE 1.10 09/03/2017   GLUCOSE 99 09/03/2017   Lab Results  Component Value Date   INR 1.01 02/02/2014    Assessment/Plan: Lumbar spinal stenosis, lumbar radiculopathy, neurogenic claudication, lumbago: I have discussed the situation with the patient and his wife.  I have reviewed his imaging studies with him and pointed out the abnormalities.  We have discussed the various treatment options including surgery.  I have described the surgical treatment option of an L1-2, L2-3, L3-4, L4-5 and L5-S1  laminectomy/laminotomy/foraminotomy.  I have shown him surgical models.  I have given him a surgical pamphlet.  We have discussed the risks, benefits, alternatives, expected postoperative course, and likelihood of achieving our goals with surgery.  I have answered all the patient's questions.  He has decided to proceed with surgery.   Ophelia Charter 09/11/2017 7:17 AM

## 2017-09-11 NOTE — Anesthesia Postprocedure Evaluation (Signed)
Anesthesia Post Note  Patient: [name] Ray  Procedure(s) Performed: LAMINECTOMY AND FORAMINOTOMY LUMBAR One-Two, Two-Three, Three-Four, Four-Five, Five-Sacral One (N/A )     Patient location during evaluation: PACU Anesthesia Type: General Level of consciousness: sedated and patient cooperative Pain management: pain level controlled Vital Signs Assessment: post-procedure vital signs reviewed and stable Respiratory status: spontaneous breathing Cardiovascular status: stable Anesthetic complications: no    Last Vitals:  Vitals:   09/11/17 1244 09/11/17 1637  BP: (!) 108/49 (!) 126/59  Pulse: 67 75  Resp: 20 18  Temp: 36.4 C 36.6 C  SpO2: 96% 97%    Last Pain:  Vitals:   09/11/17 1637  TempSrc: Oral  PainSc:                  Nolon Nations

## 2017-09-11 NOTE — Transfer of Care (Signed)
Immediate Anesthesia Transfer of Care Note  Patient: [name] Ray  Procedure(s) Performed: LAMINECTOMY AND FORAMINOTOMY LUMBAR One-Two, Two-Three, Three-Four, Four-Five, Five-Sacral One (N/A )  Patient Location: PACU  Anesthesia Type:General  Level of Consciousness: awake and alert   Airway & Oxygen Therapy: Patient Spontanous Breathing and Patient connected to nasal cannula oxygen  Post-op Assessment: Report given to RN and Post -op Vital signs reviewed and stable  Post vital signs: Reviewed and stable  Last Vitals:  Vitals Value Taken Time  BP 110/54 09/11/2017 10:42 AM  Temp    Pulse 62 09/11/2017 10:44 AM  Resp 13 09/11/2017 10:44 AM  SpO2 100 % 09/11/2017 10:44 AM  Vitals shown include unvalidated device data.  Last Pain:  Vitals:   09/11/17 0614  TempSrc: Oral  PainSc:       Patients Stated Pain Goal: 1 (81/18/86 7737)  Complications: No apparent anesthesia complications

## 2017-09-11 NOTE — Evaluation (Signed)
Occupational Therapy Evaluation Patient Details Name: [name] Ray MRN: [id number] DOB: 1933-07-06 Today's Date: 09/11/2017    History of Present Illness Pt is a 84 y/o male c/o back, buttock and leg pain consistent with neurogenic claudication.  Admitted and s/p lumbar laminectomy L2-3, L3-4, L4-5 and L5-S1 and L2 laminotomies.  PMH signficant for but not limited to: arthritis, CAD, HTN, IBS, MI, prostate CA, B TKA.    Clinical Impression   PTA patient independent with mobility using SPC, ADLs using sock aide, and limited IADLs.  Currently requires setup assist for UB ADLs, min assist for LB ADLs, min assist for toilet transfers and min assist for toileting. Limited by generalized weakness in LEs, impaired balance, and back precautions. He was educated on precautions, safety, ADL compensatory techniques and AE, recommendations and mobility.  He plans on dcing home with spouse who can provide support as needed.  Will benefit from continued OT services while admitted in order to maximize safety and independence with ADLs/mobility.     Follow Up Recommendations  No OT follow up;Supervision/Assistance - 24 hour    Equipment Recommendations  None recommended by OT    Recommendations for Other Services PT consult     Precautions / Restrictions Precautions Precautions: Back Precaution Booklet Issued: Yes (comment) Precaution Comments: reviewed precautions  Restrictions Weight Bearing Restrictions: No      Mobility Bed Mobility Overal bed mobility: Needs Assistance Bed Mobility: Rolling;Sidelying to Sit;Sit to Sidelying Rolling: Min guard Sidelying to sit: Min assist     Sit to sidelying: Min assist General bed mobility comments: to ensure log rolling technique, min assist to transition trunk to sitting and min assist to bring legs into bed returning to sidelying  Transfers Overall transfer level: Needs assistance Equipment used: Straight cane Transfers: Sit to/from Stand Sit  to Stand: Min assist         General transfer comment: cueing for hand placement and body mechanics    Balance Overall balance assessment: Needs assistance Sitting-balance support: Feet supported;No upper extremity supported Sitting balance-Leahy Scale: Good     Standing balance support: Single extremity supported;During functional activity Standing balance-Leahy Scale: Poor Standing balance comment: relaint on UE support                           ADL either performed or assessed with clinical judgement   ADL Overall ADL's : Needs assistance/impaired     Grooming: Set up;Sitting   Upper Body Bathing: Set up;Sitting   Lower Body Bathing: Minimal assistance;Sit to/from stand Lower Body Bathing Details (indicate cue type and reason): will benefit from AE education with long handled sponge, limited reach to B feet and unable to complete figure 4 technique Upper Body Dressing : Set up;Sitting   Lower Body Dressing: Minimal assistance;Sit to/from stand;Cueing for back precautions Lower Body Dressing Details (indicate cue type and reason): educated on back precautions and techniques with AE, usually uses sock aide but will need reacher as unable to complete figure 4 technique Toilet Transfer: Minimal assistance;Ambulation(SPC, simulated at EOB ) Toilet Transfer Details (indicate cue type and reason): min A for safety and balance, cueing for technqiues and hand placement Toileting- Clothing Manipulation and Hygiene: Minimal assistance;Sit to/from stand       Functional mobility during ADLs: Minimal assistance;Cane General ADL Comments: Patient educated on compensatory techniques with ADLs due to back precautions, highly motivated.     Vision Patient Visual Report: No change from baseline Vision Assessment?:  No apparent visual deficits     Perception     Praxis      Pertinent Vitals/Pain Pain Assessment: Faces Faces Pain Scale: Hurts little more Pain Location:  incision back Pain Descriptors / Indicators: Discomfort;Operative site guarding Pain Intervention(s): Limited activity within patient's tolerance;Repositioned     Hand Dominance Right   Extremity/Trunk Assessment Upper Extremity Assessment Upper Extremity Assessment: Overall WFL for tasks assessed   Lower Extremity Assessment Lower Extremity Assessment: Defer to PT evaluation(reports numbness in R LE )   Cervical / Trunk Assessment Cervical / Trunk Assessment: Other exceptions Cervical / Trunk Exceptions: s/p lumbar sx   Communication Communication Communication: No difficulties   Cognition Arousal/Alertness: Awake/alert Behavior During Therapy: WFL for tasks assessed/performed Overall Cognitive Status: Within Functional Limits for tasks assessed                                     General Comments  spouse present and supportive    Exercises     Shoulder Instructions      Home Living Family/patient expects to be discharged to:: Private residence Living Arrangements: Spouse/significant other Available Help at Discharge: Family;Available 24 hours/day Type of Home: House Home Access: Ramped entrance(+1 step)     Home Layout: Two level;Able to live on main level with bedroom/bathroom Alternate Level Stairs-Number of Steps: 12 Alternate Level Stairs-Rails: Can reach both Bathroom Shower/Tub: Occupational psychologist: Handicapped height     Home Equipment: Environmental consultant - 2 wheels;Cane - single point;Bedside commode;Shower seat;Grab bars - toilet;Adaptive equipment Adaptive Equipment: Reacher;Sock aid        Prior Functioning/Environment Level of Independence: Independent with assistive device(s)        Comments: reports used SPC for mobility, independent ADLs (using sock aide), limited iADLs, driving         OT Problem List: Decreased strength;Decreased activity tolerance;Impaired balance (sitting and/or standing);Decreased safety  awareness;Decreased knowledge of precautions;Pain;Decreased knowledge of use of DME or AE      OT Treatment/Interventions: Self-care/ADL training;DME and/or AE instruction;Energy conservation;Therapeutic activities;Patient/family education;Balance training    OT Goals(Current goals can be found in the care plan section) Acute Rehab OT Goals Patient Stated Goal: to feel more steady on my feet OT Goal Formulation: With patient Time For Goal Achievement: 09/18/17 Potential to Achieve Goals: Good  OT Frequency: Min 2X/week   Barriers to D/C:            Co-evaluation              AM-PAC PT "6 Clicks" Daily Activity     Outcome Measure Help from another person eating meals?: None Help from another person taking care of personal grooming?: A Little Help from another person toileting, which includes using toliet, bedpan, or urinal?: A Little Help from another person bathing (including washing, rinsing, drying)?: A Little Help from another person to put on and taking off regular upper body clothing?: None Help from another person to put on and taking off regular lower body clothing?: A Little 6 Click Score: 20   End of Session Equipment Utilized During Treatment: Gait belt(cane) Nurse Communication: Mobility status  Activity Tolerance: Patient tolerated treatment well Patient left: in bed;with call bell/phone within reach;with family/visitor present;with nursing/sitter in room  OT Visit Diagnosis: Unsteadiness on feet (R26.81);Muscle weakness (generalized) (M62.81);Pain Pain - part of body: (back incision)  Time: 9357-0177 OT Time Calculation (min): 28 min Charges:  OT General Charges $OT Visit: 1 Visit OT Evaluation $OT Eval Moderate Complexity: 1 Mod OT Treatments $Self Care/Home Management : 8-22 mins  Delight Stare, OTR/L  Pager [place] 09/11/2017, 4:43 PM

## 2017-09-11 NOTE — Op Note (Signed)
Brief history: The patient is an 84 year old white male who has complained of back and left great and right leg pain consistent with neurogenic claudication.  He has failed medical management and was worked up with a lumbar MRI.  This demonstrated the patient had multilevel degenerative changes with spinal stenosis.  I discussed the various treatment options with the patient and his wife.  He has weighed the risks, benefits, and alternatives of surgery and decided proceed with a lumbar laminectomy.  Preoperative diagnosis: Lumbar spinal stenosis with neurogenic claudication, lumbago, lumbar radiculopathy  Postoperative diagnosis: The same  Procedure: L2-3, L3-4, L4-5 and L5-S1 laminectomy L2 laminotomies using microdissection  Surgeon: Dr. Earle Gell  Asst.: Dr. Sherley Bounds  Anesthesia: Gen. endotracheal  Estimated blood loss: 200 cc  Drains: Medium Hemovac drain in the epidural space  Complications: None  Description of procedure: The patient was brought to the operating room by the anesthesia team. General endotracheal anesthesia was induced. The patient was turned to the prone position on the Wilson frame. The patient's lumbosacral region was then prepared with Betadine scrub and Betadine solution. Sterile drapes were applied.  I then injected the area to be incised with Marcaine with epinephrine solution. I then used a scalpel to make a linear midline incision over the 1 2, L2-3, L3-4 and L4-5 intervertebral disc space. I then used electrocautery to perform a lateral subperiosteal dissection exposing the spinous process and lamina of 1, L2, L3, L4, L5 and the upper sacrum. We obtained intraoperative radiograph to confirm our location. I then inserted the Versatrac retractor for exposure.  We began the decompression by incising the inner spinous ligament at L1-2, L2-3, L3-4, L4-5 and L5-S1.  I used the Leksell rongeur to remove the spinous process of L2, L3, L4 and L5.  I then used the  high-speed drill to create bilateral L1-2, L2-3, L3-4, L4-5 and L5-S1 laminotomies.  I used the Kerrison punches to complete the laminectomy at L2-3, L3-4, L4-5 and L5-S1 and to widen the laminotomies at L1-2.  We removed the ligamentum flavum bilaterally at L1-2, L2-3, L3-4, L4-5 and L5-S1.  We then brought the operative microscope into the field. Under its magnification and illumination we completed the microdissection. We then used microdissection to free up the thecal sac and the bilateral L2, L3, L4, L5 and S1 nerve root from the epidural tissue. I then used a Kerrison punch to perform a foraminotomy at about the bilateral L2, L3, L4, L5 and S1 nerve root.  SPECT of the intervertebral discs bilaterally at these levels and did not encounter any significant excoriations.  I then palpated along the ventral surface of the thecal sac and along exit route of the right lateral L2, L3, L4, L5 and S1 nerve root and noted that the neural structures were well decompressed. This completed the decompression.  We then obtained hemostasis using bipolar electrocautery. We irrigated the wound out with bacitracin solution. We then removed the retractor.  We injected Exparel.  We placed a medium Hemovac back drain in the epidural space and tunneled it out through a separate stab wound.  We then reapproximated the patient's thoracolumbar fascia with interrupted #1 Vicryl suture. We then reapproximated the patient's subcutaneous tissue with interrupted 2-0 Vicryl suture. We then reapproximated patient's skin with Steri-Strips and benzoin. The was then coated with bacitracin ointment. The drapes were removed. The patient was subsequently returned to the supine position where they were extubated by the anesthesia team. The patient was then transported to the  postanesthesia care unit in stable condition. All sponge instrument and needle counts were reportedly correct at the end of this case.

## 2017-09-11 NOTE — Evaluation (Signed)
Physical Therapy Evaluation Patient Details Name: [name] Ray MRN: [id number] DOB: 03/13/1933 Today's Date: 09/11/2017   History of Present Illness  Pt is a 84 y/o male c/o back, buttock and leg pain consistent with neurogenic claudication.  Admitted and s/p lumbar laminectomy L2-3, L3-4, L4-5 and L5-S1 and L2 laminotomies.  PMH signficant for but not limited to: arthritis, CAD, HTN, IBS, MI, prostate CA, B TKA.   Clinical Impression  Patient is s/p above surgery resulting in the deficits listed below (see PT Problem List). Pt unsteady initially and loosing balance to the R, requiring min A for balance. Improved steadiness noted with increased distance with use of RW. Educated about back precautions and generalized walking program. Patient will benefit from skilled PT to increase their independence and safety with mobility (while adhering to their precautions) to allow discharge to the venue listed below.     Follow Up Recommendations No PT follow up;Supervision for mobility/OOB    Equipment Recommendations  Rolling walker with 5" wheels    Recommendations for Other Services       Precautions / Restrictions Precautions Precautions: Back Precaution Booklet Issued: Yes (comment) Precaution Comments: Pt able to recall 3/3 back precautions when asked.  Restrictions Weight Bearing Restrictions: No      Mobility  Bed Mobility Overal bed mobility: Needs Assistance Bed Mobility: Rolling;Sidelying to Sit;Sit to Sidelying Rolling: Supervision Sidelying to sit: Supervision     Sit to sidelying: Min assist General bed mobility comments: Supervision to come up to sitting. Verbal cues for log roll. Min A for LE lift assist.   Transfers Overall transfer level: Needs assistance Equipment used: Rolling walker (2 wheeled) Transfers: Sit to/from Stand Sit to Stand: Min assist         General transfer comment: cueing for hand placement and body mechanics. Min A for lift assist and  steadying.   Ambulation/Gait Ambulation/Gait assistance: Min assist;Min guard Gait Distance (Feet): 150 Feet Assistive device: Rolling walker (2 wheeled) Gait Pattern/deviations: Step-through pattern;Decreased stride length;Staggering right Gait velocity: Decreased    General Gait Details: Pt with unsteady gait initially and loosing balance to the R, however, with cues for upright posture and even UE support distribution on RW, pt with improved steadiness. Verbal cues for sequencing. Educated about use of RW at home to increase safety and educated about generalized walking program.   Stairs            Wheelchair Mobility    Modified Rankin (Stroke Patients Only)       Balance Overall balance assessment: Needs assistance Sitting-balance support: Feet supported;No upper extremity supported Sitting balance-Leahy Scale: Good     Standing balance support: During functional activity;Bilateral upper extremity supported Standing balance-Leahy Scale: Poor Standing balance comment: relaint on UE support                             Pertinent Vitals/Pain Pain Assessment: 0-10 Pain Score: 1  Faces Pain Scale: Hurts little more Pain Location: incision back Pain Descriptors / Indicators: Discomfort;Operative site guarding Pain Intervention(s): Limited activity within patient's tolerance;Monitored during session;Repositioned    Home Living Family/patient expects to be discharged to:: Private residence Living Arrangements: Spouse/significant other Available Help at Discharge: Family;Available 24 hours/day Type of Home: House Home Access: Ramped entrance(+1 step)     Home Layout: Two level;Able to live on main level with bedroom/bathroom Home Equipment: Gilford Rile - 2 wheels;Cane - single point;Bedside commode;Shower seat;Grab bars - toilet;Adaptive equipment  Prior Function Level of Independence: Independent with assistive device(s)         Comments: reports  used SPC for mobility, independent ADLs (using sock aide), limited iADLs, driving      Hand Dominance   Dominant Hand: Right    Extremity/Trunk Assessment   Upper Extremity Assessment Upper Extremity Assessment: Defer to OT evaluation    Lower Extremity Assessment Lower Extremity Assessment: RLE deficits/detail RLE Deficits / Details: Reports numbness in RLE     Cervical / Trunk Assessment Cervical / Trunk Assessment: Other exceptions Cervical / Trunk Exceptions: s/p lumbar sx  Communication   Communication: No difficulties  Cognition Arousal/Alertness: Awake/alert Behavior During Therapy: WFL for tasks assessed/performed Overall Cognitive Status: Within Functional Limits for tasks assessed                                        General Comments General comments (skin integrity, edema, etc.): spouse present and supportive    Exercises     Assessment/Plan    PT Assessment Patient needs continued PT services  PT Problem List Decreased strength;Decreased balance;Decreased mobility;Decreased knowledge of use of DME;Pain       PT Treatment Interventions DME instruction;Gait training;Functional mobility training;Therapeutic activities;Therapeutic exercise;Balance training;Patient/family education    PT Goals (Current goals can be found in the Care Plan section)  Acute Rehab PT Goals Patient Stated Goal: to feel more steady on my feet PT Goal Formulation: With patient Time For Goal Achievement: 10/02/17 Potential to Achieve Goals: Good    Frequency Min 5X/week   Barriers to discharge        Co-evaluation               AM-PAC PT "6 Clicks" Daily Activity  Outcome Measure Difficulty turning over in bed (including adjusting bedclothes, sheets and blankets)?: A Little Difficulty moving from lying on back to sitting on the side of the bed? : A Little Difficulty sitting down on and standing up from a chair with arms (e.g., wheelchair, bedside  commode, etc,.)?: Unable Help needed moving to and from a bed to chair (including a wheelchair)?: A Little Help needed walking in hospital room?: A Little Help needed climbing 3-5 steps with a railing? : A Lot 6 Click Score: 15    End of Session Equipment Utilized During Treatment: Gait belt Activity Tolerance: Patient tolerated treatment well Patient left: in bed;with call bell/phone within reach;with family/visitor present Nurse Communication: Mobility status PT Visit Diagnosis: Unsteadiness on feet (R26.81);Muscle weakness (generalized) (M62.81);Pain Pain - part of body: (back )    Time: 9892-1194 PT Time Calculation (min) (ACUTE ONLY): 25 min   Charges:   PT Evaluation $PT Eval Low Complexity: 1 Low PT Treatments $Gait Training: 8-22 mins        Leighton Ruff, PT, DPT  Acute Rehabilitation Services  Pager: [phone]   [name] Ray 09/11/2017, 6:33 PM

## 2017-09-11 NOTE — Anesthesia Procedure Notes (Signed)
Procedure Name: Intubation Date/Time: 09/11/2017 7:40 AM Performed by: Bryson Corona, CRNA Pre-anesthesia Checklist: Patient identified, Emergency Drugs available, Suction available and Patient being monitored Patient Re-evaluated:Patient Re-evaluated prior to induction Oxygen Delivery Method: Circle System Utilized Preoxygenation: Pre-oxygenation with 100% oxygen Induction Type: IV induction Ventilation: Oral airway inserted - appropriate to patient size Laryngoscope Size: Mac and 3 Grade View: Grade I Tube type: Oral Tube size: 7.0 mm Number of attempts: 1 Airway Equipment and Method: Stylet and Oral airway Placement Confirmation: ETT inserted through vocal cords under direct vision,  positive ETCO2 and breath sounds checked- equal and bilateral Secured at: 21 cm Tube secured with: Tape Dental Injury: Teeth and Oropharynx as per pre-operative assessment

## 2017-09-12 ENCOUNTER — Encounter (HOSPITAL_COMMUNITY): Payer: Self-pay | Admitting: Neurosurgery

## 2017-09-12 LAB — BASIC METABOLIC PANEL
Anion gap: 6 (ref 5–15)
BUN: 23 mg/dL (ref 8–23)
CO2: 25 mmol/L (ref 22–32)
Calcium: 8.4 mg/dL — ABNORMAL LOW (ref 8.9–10.3)
Chloride: 107 mmol/L (ref 98–111)
Creatinine, Ser: 1.14 mg/dL (ref 0.61–1.24)
GFR calc Af Amer: >60 mL/min (ref >60)
GFR calc non Af Amer: 58 mL/min — ABNORMAL LOW (ref >60)
Glucose, Bld: 130 mg/dL — ABNORMAL HIGH (ref 70–99)
Potassium: 4.7 mmol/L (ref 3.5–5.1)
Sodium: 138 mmol/L (ref 135–145)

## 2017-09-12 LAB — GLUCOSE, CAPILLARY
Glucose-Capillary: 112 mg/dL — ABNORMAL HIGH (ref 70–99)
Glucose-Capillary: 102 mg/dL — ABNORMAL HIGH (ref 70–99)

## 2017-09-12 LAB — CBC
HCT: 28.6 % — ABNORMAL LOW (ref 39.0–52.0)
Hemoglobin: 9.4 g/dL — ABNORMAL LOW (ref 13.0–17.0)
MCH: 31.9 pg (ref 26.0–34.0)
MCHC: 32.9 g/dL (ref 30.0–36.0)
MCV: 96.9 fL (ref 78.0–100.0)
Platelets: 142 10*3/uL — ABNORMAL LOW (ref 150–400)
RBC: 2.95 MIL/uL — ABNORMAL LOW (ref 4.22–5.81)
RDW: 12.7 % (ref 11.5–15.5)
WBC: 9.4 10*3/uL (ref 4.0–10.5)

## 2017-09-12 MED ORDER — DOCUSATE SODIUM 100 MG PO CAPS: 100.0000 mg | ORAL_CAPSULE | Freq: Two times a day (BID) | ORAL | 0 refills | Status: DC

## 2017-09-12 MED ORDER — OXYCODONE HCL 5 MG PO TABS: 5.0000 mg | ORAL_TABLET | ORAL | 0 refills | Status: DC | PRN

## 2017-09-12 NOTE — Progress Notes (Signed)
Occupational Therapy Treatment Patient Details Name: [name] Ray MRN: [id number] DOB: Dec 10, 1933 Today's Date: 09/12/2017    History of present illness Pt is a 84 y/o male c/o back, buttock and leg pain consistent with neurogenic claudication.  Admitted and s/p lumbar laminectomy L2-3, L3-4, L4-5 and L5-S1 and L2 laminotomies.  PMH signficant for but not limited to: arthritis, CAD, HTN, IBS, MI, prostate CA, B TKA.    OT comments  Patient progressing well.  Demonstrates good recall and adherence to back precautions during session, completing bed mobility with supervision, toilet transfers with supervision, LB dressing with min assist and shower transfers simulated with min guard assist.  He was educated on LB AE and compensatory techniques to increase independence, continued education on body mechanics, precautions and safety.  Plans to dc home with spouses support and anticipate will do well.  Will continue to follow while admitted.    Follow Up Recommendations  No OT follow up;Supervision/Assistance - 24 hour    Equipment Recommendations  None recommended by OT    Recommendations for Other Services PT consult    Precautions / Restrictions Precautions Precautions: Back Precaution Booklet Issued: Yes (comment) Precaution Comments: Pt able to recall 3/3 back precautions when asked.  Restrictions Weight Bearing Restrictions: No       Mobility Bed Mobility Overal bed mobility: Needs Assistance Bed Mobility: Rolling;Sidelying to Sit Rolling: Supervision Sidelying to sit: Supervision       General bed mobility comments: good recall of log roll technique, supervision for safety  Transfers Overall transfer level: Needs assistance Equipment used: Rolling walker (2 wheeled) Transfers: Sit to/from Stand Sit to Stand: Supervision         General transfer comment: good recall of technique, close supervision for safety     Balance Overall balance assessment: Needs  assistance Sitting-balance support: Feet supported;No upper extremity supported Sitting balance-Leahy Scale: Good     Standing balance support: During functional activity;Bilateral upper extremity supported Standing balance-Leahy Scale: Poor Standing balance comment: relaint on UE support                           ADL either performed or assessed with clinical judgement   ADL Overall ADL's : Needs assistance/impaired             Lower Body Bathing: Supervison/ safety;Sit to/from stand;Adhering to back precautions Lower Body Bathing Details (indicate cue type and reason): reviewed precautions for back and use of long handled sponge, completing seated with spouse support     Lower Body Dressing: Min guard;Sit to/from stand;Adhering to back precautions;With adaptive equipment Lower Body Dressing Details (indicate cue type and reason): reviewed back precautions and use of AE, patient verbalized understanding with reacher and already uses sock aide PTA Toilet Transfer: Supervision/safety;Ambulation;RW(simulated to recliner ) Armed forces technical officer Details (indicate cue type and reason): supervision for safety, using RW, good recall of technique     Tub/ Shower Transfer: Min guard;Cueing for safety;Adhering to back precautions;Ambulation;Shower Technical sales engineer Details (indicate cue type and reason): demonstrated technique with reverse step with min guard for safety, will have spouses support at dc Functional mobility during ADLs: Supervision/safety;Rolling walker General ADL Comments: good recall of precautions and techniques for ADL compensation     Vision       Perception     Praxis      Cognition Arousal/Alertness: Awake/alert Behavior During Therapy: WFL for tasks assessed/performed Overall Cognitive Status: Within Functional Limits for tasks assessed  Exercises     Shoulder  Instructions       General Comments      Pertinent Vitals/ Pain       Pain Assessment: Faces Faces Pain Scale: Hurts little more Pain Location: incision back Pain Descriptors / Indicators: Discomfort;Operative site guarding Pain Intervention(s): Limited activity within patient's tolerance;Repositioned  Home Living                                          Prior Functioning/Environment              Frequency  Min 2X/week        Progress Toward Goals  OT Goals(current goals can now be found in the care plan section)  Progress towards OT goals: Progressing toward goals  Acute Rehab OT Goals Patient Stated Goal: to feel more steady on my feet OT Goal Formulation: With patient Time For Goal Achievement: 09/18/17 Potential to Achieve Goals: Good  Plan Discharge plan remains appropriate;Frequency remains appropriate    Co-evaluation                 AM-PAC PT "6 Clicks" Daily Activity     Outcome Measure   Help from another person eating meals?: None Help from another person taking care of personal grooming?: None Help from another person toileting, which includes using toliet, bedpan, or urinal?: A Little Help from another person bathing (including washing, rinsing, drying)?: A Little Help from another person to put on and taking off regular upper body clothing?: None Help from another person to put on and taking off regular lower body clothing?: A Little 6 Click Score: 21    End of Session Equipment Utilized During Treatment: Rolling walker  OT Visit Diagnosis: Unsteadiness on feet (R26.81);Muscle weakness (generalized) (M62.81);Pain Pain - part of body: (back)   Activity Tolerance Patient tolerated treatment well   Patient Left in chair;with call bell/phone within reach   Nurse Communication Mobility status        Time: 9675-9163 OT Time Calculation (min): 16 min  Charges: OT General Charges $OT Visit: 1 Visit OT  Treatments $Self Care/Home Management : 8-22 mins  Delight Stare, OTR/L  Pager [place] 09/12/2017, 9:15 AM

## 2017-09-12 NOTE — Discharge Instructions (Signed)
Wound Care Keep incision covered and dry for 2 days and take a shower   You may remove outer bandage after 2 days. Do not put any creams, lotions, or ointments on incision. Leave steri-strips on back  They will fall off by themselves. Activity Walk each and every day, increasing distance each day. No lifting greater than 8 lbs.  Avoid excessive back movement. No driving for 2 weeks; may ride as a passenger locally.  Diet Resume your normal soft diet.  Return to Work Will be discussed at you follow up appointment. Call Your Doctor If Any of These Occur Redness, drainage, or swelling at the wound.  Temperature greater than 101 degrees. Severe pain not relieved by pain medication. Increased difficulty swallowing.  Incision starts to come apart. Follow Up Appt Call today for appointment in 3 weeks ([phone]) or for problems.  If you have any hardware placed in your spine, you will need an x-ray before your appointment.

## 2017-09-12 NOTE — Discharge Summary (Signed)
Physician Discharge Summary  Patient ID: [name] Ray MRN: [id number] DOB/AGE: November 26, 1933 84 y.o.  Admit date: 09/11/2017 Discharge date: 09/12/2017  Admission Diagnoses: Lumbar spinal stenosis, neurogenic claudication, lumbago, lumbar radiculopathy  Discharge Diagnoses: The same Active Problems:   Spinal stenosis of lumbar region with neurogenic claudication   Discharged Condition: good  Hospital Course: I performed an L1-2, L2-3, L3-4 L4-5 and L5-S1 laminectomy/laminotomy/foraminotomy on the patient on 09/11/2017.  The surgery went well.  On postoperative day #1 the patient requested discharge home.  If he urinates adequately status post removal of the Foley he will be discharged home later today.  He was given written and oral discharge instructions.  All his questions were answered.  Consults: Physical therapy Significant Diagnostic Studies: None Treatments: L1-2, L2-3, L3-4, L4-5 and L5-S1 laminectomy/laminotomy/foraminotomies using microdissection. Discharge Exam: Blood pressure (!) 114/50, pulse 63, temperature 98.2 F (36.8 C), temperature source Oral, resp. rate 18, SpO2 97 %. Patient is alert and pleasant.  His dressing is clean and dry.  His drain has put out 125 cc.  We will discontinue it.  Is moving his lower extremities well.  Disposition: Home   Allergies as of 09/12/2017      Reactions   Atorvastatin Other (See Comments)   SEVERE LEG PAIN Can not walk   Rosuvastatin Other (See Comments)   JOINT PAIN  Makes knees hurt    Clindamycin Rash   PATIENT REFUSES      Medication List    TAKE these medications   acetaminophen 650 MG CR tablet Commonly known as:  TYLENOL Take 650 mg by mouth every 8 (eight) hours as needed for pain.   amLODipine 5 MG tablet Commonly known as:  NORVASC Take 5 mg by mouth at bedtime.   amoxicillin 500 MG capsule Commonly known as:  AMOXIL Take 2,000 mg by mouth See admin instructions. Take 4 capsules (2000 mg) by mouth 1  prior to dental appointments.   clopidogrel 75 MG tablet Commonly known as:  PLAVIX Take 75 mg by mouth daily.   docusate sodium 100 MG capsule Commonly known as:  COLACE Take 1 capsule (100 mg total) by mouth 2 (two) times daily.   KOMBIGLYZE XR 2.05-998 MG Tb24 Generic drug:  Saxagliptin-Metformin Take 1 tablet by mouth 2 (two) times daily.   loperamide 2 MG tablet Commonly known as:  IMODIUM A-D Take 2 mg by mouth 4 (four) times daily as needed for diarrhea or loose stools.   loratadine 10 MG tablet Commonly known as:  CLARITIN Take 10 mg by mouth daily as needed for allergies.   multivitamin with minerals Tabs tablet Take 1 tablet by mouth daily. Men's One-A-Day 50+   nitroGLYCERIN 0.4 MG SL tablet Commonly known as:  NITROSTAT Place 1 tablet (0.4 mg total) under the tongue every 5 (five) minutes as needed for chest pain. Please keep upcoming appt. Thank you   omeprazole 20 MG capsule Commonly known as:  PRILOSEC Take 20 mg by mouth daily as needed (for acid reflux or indigestion).   oxyCODONE 5 MG immediate release tablet Commonly known as:  Oxy IR/ROXICODONE Take 1 tablet (5 mg total) by mouth every 4 (four) hours as needed for moderate pain ((score 4 to 6)).   ramipril 10 MG capsule Commonly known as:  ALTACE Take 10 mg by mouth daily after breakfast.   simvastatin 20 MG tablet Commonly known as:  ZOCOR Take 20 mg by mouth at bedtime.   tamsulosin 0.4 MG Caps capsule Commonly known  as:  FLOMAX Take 0.4 mg by mouth at bedtime.   vitamin B-12 1000 MCG tablet Commonly known as:  CYANOCOBALAMIN Take 1,000 mcg by mouth daily.        Signed: Ophelia Charter 09/12/2017, 6:38 AM

## 2017-09-12 NOTE — Progress Notes (Signed)
Patient alert and oriented, mae's well, voiding adequate amount of urine, swallowing without difficulty, no c/o pain at time of discharge. Patient discharged home with family. Script and discharged instructions given to patient. Patient and family stated understanding of instructions given. Patient has an appointment with Dr. Jenkins   

## 2017-09-12 NOTE — Progress Notes (Signed)
Physical Therapy Treatment Patient Details Name: [name] Ray MRN: [id number] DOB: [birth year] Today's Date: 09/12/2017    History of Present Illness Pt is a 84 y/o male c/o back, buttock and leg pain consistent with neurogenic claudication.  Admitted and s/p lumbar laminectomy L2-3, L3-4, L4-5 and L5-S1 and L2 laminotomies.  PMH signficant for but not limited to: arthritis, CAD, HTN, IBS, MI, prostate CA, B TKA.     PT Comments    Pt progressing well with post-op mobility, performing ambulation with gross min g, with RW for safety. Pt only experienced LOB x1 during session, but was able to self-recover without assist. Pt educated on generalized walking program, car transfers, and overall safety with mobility. Pt's spouse reports she was able to get a part for the back leg of their RW and no longer needs one ordered. Will continue to follow and progress acutely, per POC.    Follow Up Recommendations  No PT follow up;Supervision/Assistance - 24 hour     Equipment Recommendations  None recommended by PT    Recommendations for Other Services       Precautions / Restrictions Precautions Precautions: Back Precaution Comments: Reviewed precautions with pt. Recalled 3/3. Restrictions Weight Bearing Restrictions: No    Mobility  Bed Mobility Overal bed mobility: Needs Assistance Bed Mobility: Rolling;Sidelying to Sit;Sit to Sidelying Rolling: Supervision Sidelying to sit: Supervision     Sit to sidelying: Min assist General bed mobility comments: Pt required min cues for log roll technique with sit>sidelying at the end of session as pt attempted to descend to bed straight back before cueing to transfer onto side first. Able to elevate bil LE 3/4 of the way onto the bed but required min A to complete LE elevation. Pt's spouse entered at end of session and educated on appropriate assist for LE elevation and she reported she will be able to assist in and out of bed 24 hours at d/c. HOB  lowered at beginning of session.  Transfers Overall transfer level: Needs assistance Equipment used: Rolling walker (2 wheeled) Transfers: Sit to/from Stand Sit to Stand: Supervision         General transfer comment: Min cues for hand placement. Supervision for safety.  Ambulation/Gait Ambulation/Gait assistance: Min guard Gait Distance (Feet): 250 Feet Assistive device: Rolling walker (2 wheeled) Gait Pattern/deviations: Step-through pattern;Decreased stance time - right;Decreased weight shift to right;Antalgic Gait velocity: Decreased  Gait velocity interpretation: <1.31 ft/sec, indicative of household ambulator General Gait Details: Pt with improved balance overall at time of session with LOB X1 when pt attempted to hit handicap door opener without first stopping. Pt able to regain balance with RW. VCs for upright posture and to remain within RW.   Stairs             Wheelchair Mobility    Modified Rankin (Stroke Patients Only)       Balance Overall balance assessment: Needs assistance Sitting-balance support: Feet supported;No upper extremity supported Sitting balance-Leahy Scale: Good     Standing balance support: During functional activity;Bilateral upper extremity supported Standing balance-Leahy Scale: Poor Standing balance comment: At least one UE support for static standing. Bil UE support for dynamic standing balance.                             Cognition Arousal/Alertness: Awake/alert Behavior During Therapy: WFL for tasks assessed/performed Overall Cognitive Status: Within Functional Limits for tasks assessed  Exercises      General Comments General comments (skin integrity, edema, etc.): Pt's spouse present at end of session.       Pertinent Vitals/Pain Pain Assessment: 0-10 Pain Score: 5  Pain Location: incision on back Pain Descriptors / Indicators:  Discomfort;Grimacing;Operative site guarding;Sore Pain Intervention(s): Limited activity within patient's tolerance;Monitored during session    Home Living                      Prior Function            PT Goals (current goals can now be found in the care plan section) Acute Rehab PT Goals Patient Stated Goal: to feel more steady on my feet PT Goal Formulation: With patient Time For Goal Achievement: 10/02/17 Potential to Achieve Goals: Good Progress towards PT goals: Progressing toward goals    Frequency    Min 5X/week      PT Plan Current plan remains appropriate    Co-evaluation              AM-PAC PT "6 Clicks" Daily Activity  Outcome Measure  Difficulty turning over in bed (including adjusting bedclothes, sheets and blankets)?: A Little Difficulty moving from lying on back to sitting on the side of the bed? : Unable Difficulty sitting down on and standing up from a chair with arms (e.g., wheelchair, bedside commode, etc,.)?: Unable Help needed moving to and from a bed to chair (including a wheelchair)?: A Little Help needed walking in hospital room?: A Little Help needed climbing 3-5 steps with a railing? : A Lot 6 Click Score: 13    End of Session Equipment Utilized During Treatment: Gait belt Activity Tolerance: Patient tolerated treatment well Patient left: in bed;with call bell/phone within reach;with family/visitor present Nurse Communication: Mobility status PT Visit Diagnosis: Unsteadiness on feet (R26.81);Muscle weakness (generalized) (M62.81);Pain;Other abnormalities of gait and mobility (R26.89) Pain - part of body: (back)     Time: 0263-7858 PT Time Calculation (min) (ACUTE ONLY): 19 min  Charges:  $Gait Training: 8-22 mins                     Einar Crow, [place]  Student Physical Therapist Acute Rehab [phone]    Einar Crow 09/12/2017, 2:37 PM

## 2017-09-17 MED FILL — Thrombin (Recombinant) For Soln 20000 Unit: CUTANEOUS | Qty: 1 | Status: AC

## 2017-12-22 ENCOUNTER — Telehealth: Payer: Self-pay | Admitting: *Deleted

## 2017-12-22 NOTE — Telephone Encounter (Signed)
   [facility] Pre-operative Risk Assessment    Request for surgical clearance:  1. What type of surgery is being performed? LEFT TOTAL HIP   2. When is this surgery scheduled? 03/04/18   3. Are there any medications that need to be held prior to surgery and how long? PLAVIX    4. Practice name and name of physician performing surgery? [facility]; DR. Wynelle Link   5. What is your office phone and fax number? PH# [phone]; FAX# [phone]   6. Anesthesia type (None, local, MAC, general) ? CHOICE   [name] Ray 12/22/2017, 4:58 PM  _________________________________________________________________   (provider comments below)

## 2017-12-29 NOTE — Telephone Encounter (Signed)
Pt is stable and can walk up 2 flights of steps though with hip plin.  Will check with Dr. Burt Knack for holding plavix, he did hold in August for back surgery.

## 2017-12-31 NOTE — Telephone Encounter (Signed)
Fine to hold plavix x 5 days prior to hip surgery. thanks

## 2018-01-19 NOTE — Progress Notes (Signed)
EKG 07-07-17 Epic   TELE NOT CARDIOLOGY 12-22-17 Epic   LOV CARDS 07-07-17 Epic

## 2018-01-19 NOTE — Patient Instructions (Signed)
[name] Ray  01/19/2018   Your procedure is scheduled on: 01-26-18    Report to [facility] Main  Entrance     Report to admitting at 6:00AM    Call this number if you have problems the morning of surgery [phone]      Remember: Do not eat food or drink liquids :After Midnight. BRUSH YOUR TEETH MORNING OF SURGERY AND RINSE YOUR MOUTH OUT, NO CHEWING GUM CANDY OR MINTS.     Take these medicines the morning of surgery with A SIP OF WATER: CLARITIN IF NEEDED, PRILOSEC  DO NOT TAKE ANY DIABETIC MEDICATIONS DAY OF YOUR SURGERY PLEASE CHECK YOUR BLOOD SUGAR THE MORNING OF SURGERY; REPORT TO NURSE WHEN YOU ARRIVE                                You may not have any metal on your body including hair pins and              piercings  Do not wear jewelry, make-up, lotions, powders or perfumes, deodorant                          Men may shave face and neck.   Do not bring valuables to the hospital. [place].  Contacts, dentures or bridgework may not be worn into surgery.  Leave suitcase in the car. After surgery it may be brought to your room.                   Please read over the following fact sheets you were given: _____________________________________________________________________             [facility] - Preparing for Surgery Before surgery, you can play an important role.  Because skin is not sterile, your skin needs to be as free of germs as possible.  You can reduce the number of germs on your skin by washing with CHG (chlorahexidine gluconate) soap before surgery.  CHG is an antiseptic cleaner which kills germs and bonds with the skin to continue killing germs even after washing. Please DO NOT use if you have an allergy to CHG or antibacterial soaps.  If your skin becomes reddened/irritated stop using the CHG and inform your nurse when you arrive at Short Stay. Do not shave (including legs and  underarms) for at least 48 hours prior to the first CHG shower.  You may shave your face/neck. Please follow these instructions carefully:  1.  Shower with CHG Soap the night before surgery and the  morning of Surgery.  2.  If you choose to wash your hair, wash your hair first as usual with your  normal  shampoo.  3.  After you shampoo, rinse your hair and body thoroughly to remove the  shampoo.                           4.  Use CHG as you would any other liquid soap.  You can apply chg directly  to the skin and wash  Gently with a scrungie or clean washcloth.  5.  Apply the CHG Soap to your body ONLY FROM THE NECK DOWN.   Do not use on face/ open                           Wound or open sores. Avoid contact with eyes, ears mouth and genitals (private parts).                       Wash face,  Genitals (private parts) with your normal soap.             6.  Wash thoroughly, paying special attention to the area where your surgery  will be performed.  7.  Thoroughly rinse your body with warm water from the neck down.  8.  DO NOT shower/wash with your normal soap after using and rinsing off  the CHG Soap.                9.  Pat yourself dry with a clean towel.            10.  Wear clean pajamas.            11.  Place clean sheets on your bed the night of your first shower and do not  sleep with pets. Day of Surgery : Do not apply any lotions/deodorants the morning of surgery.  Please wear clean clothes to the hospital/surgery center.  FAILURE TO FOLLOW THESE INSTRUCTIONS MAY RESULT IN THE CANCELLATION OF YOUR SURGERY PATIENT SIGNATURE_________________________________  NURSE SIGNATURE__________________________________  ________________________________________________________________________   [name] Ray  An incentive spirometer is a tool that can help keep your lungs clear and active. This tool measures how well you are filling your lungs with each breath. Taking  long deep breaths may help reverse or decrease the chance of developing breathing (pulmonary) problems (especially infection) following:  A long period of time when you are unable to move or be active. BEFORE THE PROCEDURE   If the spirometer includes an indicator to show your best effort, your nurse or respiratory therapist will set it to a desired goal.  If possible, sit up straight or lean slightly forward. Try not to slouch.  Hold the incentive spirometer in an upright position. INSTRUCTIONS FOR USE  1. Sit on the edge of your bed if possible, or sit up as far as you can in bed or on a chair. 2. Hold the incentive spirometer in an upright position. 3. Breathe out normally. 4. Place the mouthpiece in your mouth and seal your lips tightly around it. 5. Breathe in slowly and as deeply as possible, raising the piston or the ball toward the top of the column. 6. Hold your breath for 3-5 seconds or for as long as possible. Allow the piston or ball to fall to the bottom of the column. 7. Remove the mouthpiece from your mouth and breathe out normally. 8. Rest for a few seconds and repeat Steps 1 through 7 at least 10 times every 1-2 hours when you are awake. Take your time and take a few normal breaths between deep breaths. 9. The spirometer may include an indicator to show your best effort. Use the indicator as a goal to work toward during each repetition. 10. After each set of 10 deep breaths, practice coughing to be sure your lungs are clear. If you have an incision (the cut made at the time of surgery),  support your incision when coughing by placing a pillow or rolled up towels firmly against it. Once you are able to get out of bed, walk around indoors and cough well. You may stop using the incentive spirometer when instructed by your caregiver.  RISKS AND COMPLICATIONS  Take your time so you do not get dizzy or light-headed.  If you are in pain, you may need to take or ask for pain  medication before doing incentive spirometry. It is harder to take a deep breath if you are having pain. AFTER USE  Rest and breathe slowly and easily.  It can be helpful to keep track of a log of your progress. Your caregiver can provide you with a simple table to help with this. If you are using the spirometer at home, follow these instructions: Ray IF:   You are having difficultly using the spirometer.  You have trouble using the spirometer as often as instructed.  Your pain medication is not giving enough relief while using the spirometer.  You develop fever of 100.5 F (38.1 C) or higher. SEEK IMMEDIATE MEDICAL CARE IF:   You cough up bloody sputum that had not been present before.  You develop fever of 102 F (38.9 C) or greater.  You develop worsening pain at or near the incision site. MAKE SURE YOU:   Understand these instructions.  Will watch your condition.  Will get help right away if you are not doing well or get worse. Document Released: 05/13/2006 Document Revised: 03/25/2011 Document Reviewed: 07/14/2006 Exit[employer] Patient Information 2014 [employer], [place].   ________________________________________________________________________  WHAT IS A BLOOD TRANSFUSION? Blood Transfusion Information  A transfusion is the replacement of blood or some of its parts. Blood is made up of multiple cells which provide different functions.  Red blood cells carry oxygen and are used for blood loss replacement.  White blood cells fight against infection.  Platelets control bleeding.  Plasma helps clot blood.  Other blood products are available for specialized needs, such as hemophilia or other clotting disorders. BEFORE THE TRANSFUSION  Who gives blood for transfusions?   Healthy volunteers who are fully evaluated to make sure their blood is safe. This is blood bank blood. Transfusion therapy is the safest it has ever been in the practice of medicine.  Before blood is taken from a donor, a complete history is taken to make sure that person has no history of diseases nor engages in risky social behavior (examples are intravenous drug use or sexual activity with multiple partners). The donor's travel history is screened to minimize risk of transmitting infections, such as malaria. The donated blood is tested for signs of infectious diseases, such as HIV and hepatitis. The blood is then tested to be sure it is compatible with you in order to minimize the chance of a transfusion reaction. If you or a relative donates blood, this is often done in anticipation of surgery and is not appropriate for emergency situations. It takes many days to process the donated blood. RISKS AND COMPLICATIONS Although transfusion therapy is very safe and saves many lives, the main dangers of transfusion include:   Getting an infectious disease.  Developing a transfusion reaction. This is an allergic reaction to something in the blood you were given. Every precaution is taken to prevent this. The decision to have a blood transfusion has been considered carefully by your caregiver before blood is given. Blood is not given unless the benefits outweigh the risks. AFTER THE TRANSFUSION  Right after receiving a blood transfusion, you will usually feel much better and more energetic. This is especially true if your red blood cells have gotten low (anemic). The transfusion raises the level of the red blood cells which carry oxygen, and this usually causes an energy increase.  The nurse administering the transfusion will monitor you carefully for complications. HOME CARE INSTRUCTIONS  No special instructions are needed after a transfusion. You may find your energy is better. Speak with your caregiver about any limitations on activity for underlying diseases you may have. SEEK MEDICAL CARE IF:   Your condition is not improving after your transfusion.  You develop redness or  irritation at the intravenous (IV) site. SEEK IMMEDIATE MEDICAL CARE IF:  Any of the following symptoms occur over the next 12 hours:  Shaking chills.  You have a temperature by mouth above 102 F (38.9 C), not controlled by medicine.  Chest, back, or muscle pain.  People around you feel you are not acting correctly or are confused.  Shortness of breath or difficulty breathing.  Dizziness and fainting.  You get a rash or develop hives.  You have a decrease in urine output.  Your urine turns a dark color or changes to pink, red, or brown. Any of the following symptoms occur over the next 10 days:  You have a temperature by mouth above 102 F (38.9 C), not controlled by medicine.  Shortness of breath.  Weakness after normal activity.  The white part of the eye turns yellow (jaundice).  You have a decrease in the amount of urine or are urinating less often.  Your urine turns a dark color or changes to pink, red, or brown. Document Released: 12/29/1999 Document Revised: 03/25/2011 Document Reviewed: 08/17/2007 [facility] Patient Information 2014 [place], [place].  _______________________________________________________________________

## 2018-01-20 NOTE — H&P (Signed)
TOTAL HIP ADMISSION H&P  Patient is admitted for left total hip arthroplasty.  Subjective:  Chief Complaint: left hip pain  HPI: [name] Ray, 85 y.o. male, has a history of pain and functional disability in the left hip(s) due to arthritis and patient has failed non-surgical conservative treatments for greater than 12 weeks to include corticosteriod injections, use of assistive devices and activity modification.  Onset of symptoms was gradual starting 2 years ago with gradually worsening course since that time.The patient noted no past surgery on the left hip(s).  Patient currently rates pain in the left hip at 6 out of 10 with activity. Patient has night pain, worsening of pain with activity and weight bearing and pain that interfers with activities of daily living. Patient has evidence of bone-on-bone arthritis with collapse of the femoral head by imaging studies. This condition presents safety issues increasing the risk of falls. There is no current active infection.  Patient Active Problem List   Diagnosis Date Noted  . Spinal stenosis of lumbar region with neurogenic claudication 09/11/2017  . Episodic lightheadedness 05/04/2014  . Ejection fraction   . S/P total knee arthroplasty 05/24/2013  . Carotid artery disease ([place])   . Coronary artery disease   . Hyperlipidemia   . GERD (gastroesophageal reflux disease)   . Hypertension   . Numbness and tingling in hands   . FECAL OCCULT BLOOD 10/19/2009  . COLONIC POLYPS, ADENOMATOUS, HX OF 10/19/2009  . OVERWEIGHT 11/08/2008  . DM 10/25/2008  . GERD 10/25/2008   Past Medical History:  Diagnosis Date  . Adenomatous polyp    history  . Arthritis    knees , shoulders, hands   . Atrial bigeminy    in the past  . BPH (benign prostatic hyperplasia)   . Carotid artery disease ([place])    a. Doppler, January, 2012, 40-59% bilateral stenoses;  b.  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 Colon polyps     . Coronary artery disease    acute inferior MI Jan 2002 with stent / Taxus stent LAD 2004 / Cypher stent prox LAD and cypher stent mid circ May 2006 / nuclear stress Dec 2009 normal EF 63% / nuclear August 2011 EF 62% / inferior thining and very mild ischemia inferior and apex / catherization Sept 2011 EF 60%  mild to moderate diffuse RCA disease, moderate discrete LAD stenosis before the stent in the proximal LAD med tx rec  . Diverticulosis   . Dyslipidemia   . Ejection fraction   . Fatty liver    history  . GERD (gastroesophageal reflux disease)   . Hypertension    [facility] - 5 stents total , last cardiac cath. 2011.  Cleared for  surgery by Dr. [name]- 04/23/2013  . IBS (irritable bowel syndrome)   . Myocardial infarction ([place]) 2002  . Numbness and tingling in hands    resolved  . Overweight(278.02)   . Prostate cancer ([place])    treated /w  Lupron injection x1, then radium  . Shortness of breath dyspnea   . Type II diabetes mellitus ([place])     Past Surgical History:  Procedure Laterality Date  . CARDIAC CATHETERIZATION     last cath- 2006  . COLONOSCOPY    . CORONARY ANGIOPLASTY WITH STENT PLACEMENT     multiple stents in LAD:  "I've got 5 stents" (05/24/2013)  . EYE SURGERY Bilateral    cataract surgery with lens  implants  . LUMBAR LAMINECTOMY/DECOMPRESSION MICRODISCECTOMY N/A 09/11/2017   Procedure: LAMINECTOMY AND FORAMINOTOMY LUMBAR One-Two, Two-Three, Three-Four, Four-Five, Five-Sacral One;  Surgeon: Newman Pies, MD;  Location: [facility];  Service: Neurosurgery;  Laterality: N/A;  LAMINECTOMY AND FORAMINOTOMY LUMBAR One-Two,Two-Three, Three-Four, Four-Five, Five-Sacral One  . NEUROPLASTY / TRANSPOSITION MEDIAN NERVE AT CARPAL TUNNEL Left    also surgery on thumb, elbow- nerve release   . PROSTATE BIOPSY     followed later by HDR  . TOTAL KNEE ARTHROPLASTY Right 05/24/2013  . TOTAL KNEE ARTHROPLASTY Right 05/24/2013   Procedure: RIGHT TOTAL KNEE ARTHROPLASTY;  Surgeon:  [name], MD;  Location: [facility];  Service: Orthopedics;  Laterality: Right;  . TOTAL KNEE ARTHROPLASTY Left 02/14/2014   Procedure: LEFT TOTAL KNEE ARTHROPLASTY;  Surgeon: [name], MD;  Location: [facility];  Service: Orthopedics;  Laterality: Left;    No current facility-administered medications for this encounter.    Current Outpatient Medications  Medication Sig Dispense Refill Last Dose  . acetaminophen (TYLENOL) 650 MG CR tablet Take 650 mg by mouth every 8 (eight) hours as needed for pain.   09/10/2017 at Unknown time  . amLODipine (NORVASC) 5 MG tablet Take 5 mg by mouth at bedtime.    09/10/2017 at Unknown time  . amoxicillin (AMOXIL) 500 MG capsule Take 2,000 mg by mouth See admin instructions. Take 4 capsules (2000 mg) by mouth 1 prior to dental appointments.   More than a month at Unknown time  . clopidogrel (PLAVIX) 75 MG tablet Take 75 mg by mouth daily.    09/05/2017  . guaiFENesin (MUCINEX) 600 MG 12 hr tablet Take 1,200 mg by mouth daily as needed for cough or to loosen phlegm.     Marland Kitchen KOMBIGLYZE XR 2.05-998 MG TB24 Take 1 tablet by mouth 2 (two) times daily.  3 09/10/2017 at Unknown time  . loperamide (IMODIUM A-D) 2 MG tablet Take 2 mg by mouth 4 (four) times daily as needed for diarrhea or loose stools.   Past Week at Unknown time  . loratadine (CLARITIN) 10 MG tablet Take 10 mg by mouth daily as needed for allergies.    Past Week at Unknown time  . nitroGLYCERIN (NITROSTAT) 0.4 MG SL tablet Place 1 tablet (0.4 mg total) under the tongue every 5 (five) minutes as needed for chest pain. Please keep upcoming appt. Thank you 25 tablet 1 More than a month at Unknown time  . omeprazole (PRILOSEC) 20 MG capsule Take 20 mg by mouth daily.    More than a month at Unknown time  . ramipril (ALTACE) 10 MG capsule Take 10 mg by mouth daily after breakfast.    09/10/2017 at Unknown time  . simvastatin (ZOCOR) 20 MG tablet Take 20 mg by mouth at bedtime.     09/10/2017 at Unknown time  . tamsulosin  (FLOMAX) 0.4 MG CAPS capsule Take 0.4 mg by mouth at bedtime.    09/10/2017 at Unknown time  . docusate sodium (COLACE) 100 MG capsule Take 1 capsule (100 mg total) by mouth 2 (two) times daily. (Patient not taking: Reported on 01/12/2018) 60 capsule 0 Not Taking at Unknown time  . oxyCODONE (OXY IR/ROXICODONE) 5 MG immediate release tablet Take 1 tablet (5 mg total) by mouth every 4 (four) hours as needed for moderate pain ((score 4 to 6)). (Patient not taking: Reported on 01/12/2018) 30 tablet 0 Not Taking at Unknown time   Allergies  Allergen Reactions  . Atorvastatin Other (See Comments)  SEVERE LEG PAIN Can not walk  . Rosuvastatin Other (See Comments)    JOINT PAIN  Makes knees hurt   . Clindamycin Rash    PATIENT REFUSES    Social History   Tobacco Use  . Smoking status: Never Smoker  . Smokeless tobacco: Never Used  Substance Use Topics  . Alcohol use: No    Family History  Problem Relation Age of Onset  . Hypertension Father   . Heart disease Father   . Heart disease Brother   . Heart attack Paternal Grandfather      Review of Systems  Constitutional: Negative for chills and fever.  HENT: Negative for congestion, sore throat and tinnitus.   Eyes: Negative for double vision, photophobia and pain.  Respiratory: Negative for cough, shortness of breath and wheezing.   Cardiovascular: Negative for chest pain, palpitations and orthopnea.  Gastrointestinal: Negative for heartburn, nausea and vomiting.  Genitourinary: Negative for dysuria, frequency and urgency.  Musculoskeletal: Positive for joint pain.  Neurological: Negative for dizziness, weakness and headaches.    Objective:  Physical Exam  Well nourished and well developed.  General: Alert and oriented x3, cooperative and pleasant, no acute distress.  Head: normocephalic, atraumatic, neck supple.  Eyes: EOMI.  Respiratory: breath sounds clear in all fields, no wheezing, rales, or rhonchi. Cardiovascular:  Regular rate and rhythm, no murmurs, gallops or rubs.  Abdomen: non-tender to palpation and soft, normoactive bowel sounds. Musculoskeletal: Left Hip Exam: ROM: Flexion to 90, Internal Rotation 0, External Rotation 20, and Abduction 20 degrees. There is no tenderness over the greater trochanter bursa.  Calves soft and nontender. Motor function intact in LE. Strength 5/5 LE bilaterally. Neuro: Distal pulses 2+. Sensation to light touch intact in LE.  Vital signs in last 24 hours: Blood pressure: 148/60 mmHg Pulse: 68 bpm  Labs:   Estimated body mass index is 31.85 kg/m as calculated from the following:   Height as of 09/03/17: 5\' 5"  (1.651 m).   Weight as of 09/03/17: 86.8 kg.   Imaging Review Plain radiographs demonstrate severe degenerative joint disease of the left hip(s). The bone quality appears to be adequate for age and reported activity level.    Preoperative templating of the joint replacement has been completed, documented, and submitted to the Operating Room personnel in order to optimize intra-operative equipment management.     Assessment/Plan:  End stage arthritis, left hip(s)  The patient history, physical examination, clinical judgement of the provider and imaging studies are consistent with end stage degenerative joint disease of the left hip(s) and total hip arthroplasty is deemed medically necessary. The treatment options including medical management, injection therapy, arthroscopy and arthroplasty were discussed at length. The risks and benefits of total hip arthroplasty were presented and reviewed. The risks due to aseptic loosening, infection, stiffness, dislocation/subluxation,  thromboembolic complications and other imponderables were discussed.  The patient acknowledged the explanation, agreed to proceed with the plan and consent was signed. Patient is being admitted for inpatient treatment for surgery, pain control, PT, OT, prophylactic antibiotics, VTE  prophylaxis, progressive ambulation and ADL's and discharge planning.The patient is planning to be discharged home.   Therapy Plans: HHPT versus HEP Disposition: Home with wife Planned DVT Prophylaxis: Plavix and Aspirin (cardiac hx) DME needed: None PCP: Domenick Gong, MD Cardiologist: Sherren Mocha, MD TXA: IV Allergies: Clindamycin Anesthesia Concerns: None BMI: 32 Last HgbA1c: 5.5% Other: Per cardiology, hold Plavix for 5 days prior to operation.  - Patient was instructed on what medications  to stop prior to surgery. - Follow-up visit in 2 weeks with Dr. Wynelle Link - Begin physical therapy following surgery - Pre-operative lab work as pre-surgical testing - Prescriptions will be provided in hospital at time of discharge  Theresa Duty, PA-C Orthopedic Surgery [facility] [place]

## 2018-01-21 ENCOUNTER — Other Ambulatory Visit: Payer: Self-pay

## 2018-01-21 ENCOUNTER — Encounter (HOSPITAL_COMMUNITY): Payer: Self-pay

## 2018-01-21 ENCOUNTER — Encounter (HOSPITAL_COMMUNITY)
Admission: RE | Admit: 2018-01-21 | Discharge: 2018-01-21 | Disposition: A | Discharge status: Home / Self Care | Payer: Medicare Other | Source: Ambulatory Visit | Attending: Orthopedic Surgery | Admitting: Orthopedic Surgery

## 2018-01-21 DIAGNOSIS — E785 Hyperlipidemia, unspecified: Secondary | ICD-10-CM | POA: Insufficient documentation

## 2018-01-21 DIAGNOSIS — E119 Type 2 diabetes mellitus without complications: Secondary | ICD-10-CM | POA: Insufficient documentation

## 2018-01-21 DIAGNOSIS — Z01812 Encounter for preprocedural laboratory examination: Secondary | ICD-10-CM | POA: Insufficient documentation

## 2018-01-21 DIAGNOSIS — M1612 Unilateral primary osteoarthritis, left hip: Secondary | ICD-10-CM | POA: Diagnosis not present

## 2018-01-21 LAB — COMPREHENSIVE METABOLIC PANEL
ALT: 10 U/L (ref 0–44)
AST: 16 U/L (ref 15–41)
Albumin: 4.3 g/dL (ref 3.5–5.0)
Alkaline Phosphatase: 53 U/L (ref 38–126)
Anion gap: 7 (ref 5–15)
BUN: 20 mg/dL (ref 8–23)
CO2: 28 mmol/L (ref 22–32)
Calcium: 9.2 mg/dL (ref 8.9–10.3)
Chloride: 106 mmol/L (ref 98–111)
Creatinine, Ser: 1.02 mg/dL (ref 0.61–1.24)
GFR calc Af Amer: >60 mL/min (ref >60)
GFR calc non Af Amer: >60 mL/min (ref >60)
Glucose, Bld: 123 mg/dL — ABNORMAL HIGH (ref 70–99)
Potassium: 4.8 mmol/L (ref 3.5–5.1)
Sodium: 141 mmol/L (ref 135–145)
Total Bilirubin: 0.8 mg/dL (ref 0.3–1.2)
Total Protein: 6.9 g/dL (ref 6.5–8.1)

## 2018-01-21 LAB — CBC
HCT: 36.9 % — ABNORMAL LOW (ref 39.0–52.0)
Hemoglobin: 11.3 g/dL — ABNORMAL LOW (ref 13.0–17.0)
MCH: 28.8 pg (ref 26.0–34.0)
MCHC: 30.6 g/dL (ref 30.0–36.0)
MCV: 93.9 fL (ref 80.0–100.0)
Platelets: 177 10*3/uL (ref 150–400)
RBC: 3.93 MIL/uL — ABNORMAL LOW (ref 4.22–5.81)
RDW: 13.6 % (ref 11.5–15.5)
WBC: 5.3 10*3/uL (ref 4.0–10.5)
nRBC: 0 % (ref 0.0–0.2)

## 2018-01-21 LAB — GLUCOSE, CAPILLARY: Glucose-Capillary: 86 mg/dL (ref 70–99)

## 2018-01-21 LAB — PROTIME-INR
INR: 1
Prothrombin Time: 13.1 seconds (ref 11.4–15.2)

## 2018-01-21 LAB — ABO/RH: ABO/RH(D): A POS

## 2018-01-21 LAB — SURGICAL PCR SCREEN
MRSA, PCR: NEGATIVE
Staphylococcus aureus: NEGATIVE

## 2018-01-21 LAB — HEMOGLOBIN A1C
Hgb A1c MFr Bld: 5.7 % — ABNORMAL HIGH (ref 4.8–5.6)
Mean Plasma Glucose: 116.89 mg/dL

## 2018-01-21 LAB — APTT: aPTT: 26 seconds (ref 24–36)

## 2018-01-21 NOTE — Anesthesia Preprocedure Evaluation (Addendum)
Anesthesia Evaluation  Patient identified by MRN, date of birth, ID band Patient awake    Reviewed: Allergy & Precautions, NPO status , Patient's Chart, lab work & pertinent test results  Airway Mallampati: II  TM Distance: >3 FB     Dental   Pulmonary shortness of breath,    breath sounds clear to auscultation       Cardiovascular hypertension, + CAD and + Past MI   Rhythm:Regular Rate:Normal     Neuro/Psych    GI/Hepatic Neg liver ROS, GERD  ,  Endo/Other  diabetes  Renal/GU negative Renal ROS     Musculoskeletal  (+) Arthritis ,   Abdominal   Peds  Hematology   Anesthesia Other Findings   Reproductive/Obstetrics                            Anesthesia Physical Anesthesia Plan  ASA: III  Anesthesia Plan: General   Post-op Pain Management:    Induction: Intravenous  PONV Risk Score and Plan: 2 and Ondansetron, Dexamethasone, Midazolam and Treatment may vary due to age or medical condition  Airway Management Planned: Oral ETT  Additional Equipment:   Intra-op Plan:   Post-operative Plan: Extubation in OR  Informed Consent: I have reviewed the patients History and Physical, chart, labs and discussed the procedure including the risks, benefits and alternatives for the proposed anesthesia with the patient or authorized representative who has indicated his/her understanding and acceptance.   Dental advisory given  Plan Discussed with: CRNA and Anesthesiologist  Anesthesia Plan Comments: (See PST note 01/21/18, Konrad Felix, PA-C)      Anesthesia Quick Evaluation

## 2018-01-21 NOTE — Progress Notes (Signed)
Anesthesia Chart Review   Case:  [id number] Date/Time:  01/26/18 0840   Procedure:  TOTAL HIP ARTHROPLASTY ANTERIOR APPROACH (Left ) - 164min   Anesthesia type:  Choice   Pre-op diagnosis:  left hip osteoarthritis   Location:  [facility] ROOM 10 / [facility]   Surgeon:  [name], MD      DISCUSSION: 85 yo never smoker with h/o GERD, BPH, CAD, DM II, HTN, h/o MI, prostate cancer (treated with Lupron and radium) scheduled for above surgery on 01/26/18 with Dr. [name].    He was last seen by cardiology 06/2417, seen by Dr. Sherren Mocha.  He has CAD and has undergone stenting of the RCA ( bare metal stents), LAD (DES in 2004 and 2006), and LCx (DES in 2006). Last cath in 2011 showed patency of stented segments with moderate ISR and medical therapy was recommended.  At that time he was asymptomatic from cardiac perspective. At this time he was cleared for lumbar surgery and advised to f/u in 1 year.  Dr. Burt Knack is aware of upcoming surgery on 01/26/18.  Per telephone encounter on 12/29/17 and 12/31/17 pt is stable and can walk up 2 flights of stairs, ok to hold Plavix for 5 days prior to surgery.    Pt can proceed with planned procedure barring acute status change.  VS: BP 132/68   Pulse 65   Temp 36.6 C (Oral)   Resp 18   SpO2 100%   PROVIDERS: Tisovec, Fransico Him, MD is PCP  Sherren Mocha is cardiologist  LABS: Labs reviewed: Acceptable for surgery. (all labs ordered are listed, but only abnormal results are displayed)  Labs Reviewed  CBC - Abnormal; Notable for the following components:      Result Value   RBC 3.93 (*)    Hemoglobin 11.3 (*)    HCT 36.9 (*)    All other components within normal limits  COMPREHENSIVE METABOLIC PANEL - Abnormal; Notable for the following components:   Glucose, Bld 123 (*)    All other components within normal limits  SURGICAL PCR SCREEN  GLUCOSE, CAPILLARY  APTT  PROTIME-INR  HEMOGLOBIN A1C  TYPE AND SCREEN  ABO/RH      IMAGES:   EKG: 07/07/17  Rate 57bpm  Sinus bradycardia with sinus arrhythmia Otherwise normal ECG  CV: Echo 05/06/14 Study Conclusions  - Left ventricle: The cavity size was normal. There was mild concentric hypertrophy. Systolic function was vigorous. The estimated ejection fraction was in the range of 65% to 70%. Wall motion was normal; there were no regional wall motion abnormalities. Doppler parameters are consistent with abnormal left ventricular relaxation (grade 1 diastolic dysfunction). There was no evidence of elevated ventricular filling pressure by Doppler parameters. - Aortic valve: Trileaflet; normal thickness leaflets. - Aortic root: The aortic root was normal in size. - Mitral valve: Structurally normal valve. There was no regurgitation. - Left atrium: The atrium was normal in size. - Right ventricle: Systolic function was normal. - Right atrium: The atrium was normal in size. - Tricuspid valve: There was trivial regurgitation. - Pulmonic valve: There was no regurgitation. - Pulmonary arteries: Systolic pressure was within the normal range. - Inferior vena cava: The vessel was normal in size. - Pericardium, extracardiac: There was no pericardial effusion.  Carotid US 05/14/16 Less than 40% bilateral ICA stenosis with heterogenous plaque bilaterally.  Patent carotid arteries without significant stenosis  Past Medical History:  Diagnosis Date  . Adenomatous polyp    history  .  Arthritis    knees , shoulders, hands   . Atrial bigeminy    in the past  . BPH (benign prostatic hyperplasia)   . Carotid artery disease ([place])    a. Doppler, January, 2012, 40-59% bilateral stenoses;  b.  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 Colon polyps   . Coronary artery disease    acute inferior MI Jan 2002 with stent / Taxus stent LAD 2004 / Cypher stent prox LAD and cypher stent mid circ May 2006 / nuclear stress Dec 2009 normal EF 63% /  nuclear August 2011 EF 62% / inferior thining and very mild ischemia inferior and apex / catherization Sept 2011 EF 60%  mild to moderate diffuse RCA disease, moderate discrete LAD stenosis before the stent in the proximal LAD med tx rec  . Diverticulosis   . Dyslipidemia   . Ejection fraction   . Fatty liver    history  . GERD (gastroesophageal reflux disease)   . Hypertension    [facility] - 5 stents total , last cardiac cath. 2011.  Cleared for  surgery by Dr. [name]- 04/23/2013  . IBS (irritable bowel syndrome)   . Myocardial infarction ([place]) 2002  . Numbness and tingling in hands    resolved  . Overweight(278.02)   . Prostate cancer ([place]) approx 2016   treated /w  Lupron injection x1, then radium  . Shortness of breath dyspnea   . Type II diabetes mellitus ([place])     Past Surgical History:  Procedure Laterality Date  . CARDIAC CATHETERIZATION     last cath- 2006  . COLONOSCOPY    . CORONARY ANGIOPLASTY WITH STENT PLACEMENT     multiple stents in LAD:  "I've got 5 stents" (05/24/2013)  . EYE SURGERY Bilateral    cataract surgery with lens implants  . LUMBAR LAMINECTOMY/DECOMPRESSION MICRODISCECTOMY N/A 09/11/2017   Procedure: LAMINECTOMY AND FORAMINOTOMY LUMBAR One-Two, Two-Three, Three-Four, Four-Five, Five-Sacral One;  Surgeon: Newman Pies, MD;  Location: [facility];  Service: Neurosurgery;  Laterality: N/A;  LAMINECTOMY AND FORAMINOTOMY LUMBAR One-Two,Two-Three, Three-Four, Four-Five, Five-Sacral One  . NEUROPLASTY / TRANSPOSITION MEDIAN NERVE AT CARPAL TUNNEL Left    also surgery on thumb, elbow- nerve release   . PROSTATE BIOPSY     followed later by HDR  . TOTAL KNEE ARTHROPLASTY Right 05/24/2013  . TOTAL KNEE ARTHROPLASTY Right 05/24/2013   Procedure: RIGHT TOTAL KNEE ARTHROPLASTY;  Surgeon: [name], MD;  Location: [facility];  Service: Orthopedics;  Laterality: Right;  . TOTAL KNEE ARTHROPLASTY Left 02/14/2014   Procedure: LEFT TOTAL KNEE ARTHROPLASTY;  Surgeon: [name], MD;  Location: [facility];  Service: Orthopedics;  Laterality: Left;    MEDICATIONS: . acetaminophen (TYLENOL) 650 MG CR tablet  . amLODipine (NORVASC) 5 MG tablet  . amoxicillin (AMOXIL) 500 MG capsule  . clopidogrel (PLAVIX) 75 MG tablet  . docusate sodium (COLACE) 100 MG capsule  . guaiFENesin (MUCINEX) 600 MG 12 hr tablet  . KOMBIGLYZE XR 2.05-998 MG TB24  . loperamide (IMODIUM A-D) 2 MG tablet  . loratadine (CLARITIN) 10 MG tablet  . nitroGLYCERIN (NITROSTAT) 0.4 MG SL tablet  . omeprazole (PRILOSEC) 20 MG capsule  . oxyCODONE (OXY IR/ROXICODONE) 5 MG immediate release tablet  . ramipril (ALTACE) 10 MG capsule  . simvastatin (ZOCOR) 20 MG tablet  . tamsulosin (FLOMAX) 0.4 MG CAPS capsule   No current facility-administered medications for this encounter.     Konrad Felix,  PA-C [facility] Pre-Surgical Testing [phone] 01/21/18 3:30 PM

## 2018-01-26 ENCOUNTER — Inpatient Hospital Stay (HOSPITAL_COMMUNITY): Payer: Medicare Other

## 2018-01-26 ENCOUNTER — Inpatient Hospital Stay (HOSPITAL_COMMUNITY)
Admission: RE | Admit: 2018-01-26 | Discharge: 2018-01-28 | DRG: 470 | Disposition: A | Discharge status: Home / Self Care | Payer: Medicare Other | Attending: Orthopedic Surgery | Admitting: Orthopedic Surgery

## 2018-01-26 ENCOUNTER — Inpatient Hospital Stay (HOSPITAL_COMMUNITY): Payer: Medicare Other | Admitting: Physician Assistant

## 2018-01-26 ENCOUNTER — Encounter (HOSPITAL_COMMUNITY)
Admission: RE | Disposition: A | Discharge status: Home / Self Care | Payer: Self-pay | Source: Home / Self Care | Attending: Orthopedic Surgery

## 2018-01-26 ENCOUNTER — Inpatient Hospital Stay (HOSPITAL_COMMUNITY): Payer: Medicare Other | Admitting: Certified Registered"

## 2018-01-26 ENCOUNTER — Encounter (HOSPITAL_COMMUNITY): Payer: Self-pay | Admitting: *Deleted

## 2018-01-26 ENCOUNTER — Other Ambulatory Visit: Payer: Self-pay

## 2018-01-26 DIAGNOSIS — K760 Fatty (change of) liver, not elsewhere classified: Secondary | ICD-10-CM | POA: Diagnosis present

## 2018-01-26 DIAGNOSIS — M25559 Pain in unspecified hip: Secondary | ICD-10-CM

## 2018-01-26 DIAGNOSIS — Z86010 Personal history of colonic polyps: Secondary | ICD-10-CM

## 2018-01-26 DIAGNOSIS — Z7902 Long term (current) use of antithrombotics/antiplatelets: Secondary | ICD-10-CM

## 2018-01-26 DIAGNOSIS — Z888 Allergy status to other drugs, medicaments and biological substances status: Secondary | ICD-10-CM | POA: Diagnosis not present

## 2018-01-26 DIAGNOSIS — Z8249 Family history of ischemic heart disease and other diseases of the circulatory system: Secondary | ICD-10-CM | POA: Diagnosis not present

## 2018-01-26 DIAGNOSIS — Z8546 Personal history of malignant neoplasm of prostate: Secondary | ICD-10-CM

## 2018-01-26 DIAGNOSIS — I10 Essential (primary) hypertension: Secondary | ICD-10-CM | POA: Diagnosis present

## 2018-01-26 DIAGNOSIS — Z881 Allergy status to other antibiotic agents status: Secondary | ICD-10-CM | POA: Diagnosis not present

## 2018-01-26 DIAGNOSIS — K589 Irritable bowel syndrome without diarrhea: Secondary | ICD-10-CM | POA: Diagnosis present

## 2018-01-26 DIAGNOSIS — I2510 Atherosclerotic heart disease of native coronary artery without angina pectoris: Secondary | ICD-10-CM | POA: Diagnosis present

## 2018-01-26 DIAGNOSIS — N400 Enlarged prostate without lower urinary tract symptoms: Secondary | ICD-10-CM | POA: Diagnosis present

## 2018-01-26 DIAGNOSIS — M25752 Osteophyte, left hip: Secondary | ICD-10-CM | POA: Diagnosis present

## 2018-01-26 DIAGNOSIS — M1612 Unilateral primary osteoarthritis, left hip: Principal | ICD-10-CM | POA: Diagnosis present

## 2018-01-26 DIAGNOSIS — M169 Osteoarthritis of hip, unspecified: Secondary | ICD-10-CM

## 2018-01-26 DIAGNOSIS — K219 Gastro-esophageal reflux disease without esophagitis: Secondary | ICD-10-CM | POA: Diagnosis present

## 2018-01-26 DIAGNOSIS — E785 Hyperlipidemia, unspecified: Secondary | ICD-10-CM | POA: Diagnosis present

## 2018-01-26 DIAGNOSIS — Z7984 Long term (current) use of oral hypoglycemic drugs: Secondary | ICD-10-CM | POA: Diagnosis not present

## 2018-01-26 DIAGNOSIS — Z96653 Presence of artificial knee joint, bilateral: Secondary | ICD-10-CM | POA: Diagnosis present

## 2018-01-26 DIAGNOSIS — Z79899 Other long term (current) drug therapy: Secondary | ICD-10-CM | POA: Diagnosis not present

## 2018-01-26 DIAGNOSIS — Z955 Presence of coronary angioplasty implant and graft: Secondary | ICD-10-CM | POA: Diagnosis not present

## 2018-01-26 DIAGNOSIS — I252 Old myocardial infarction: Secondary | ICD-10-CM

## 2018-01-26 DIAGNOSIS — E119 Type 2 diabetes mellitus without complications: Secondary | ICD-10-CM | POA: Diagnosis present

## 2018-01-26 DIAGNOSIS — Z96649 Presence of unspecified artificial hip joint: Secondary | ICD-10-CM

## 2018-01-26 DIAGNOSIS — M25552 Pain in left hip: Secondary | ICD-10-CM | POA: Diagnosis present

## 2018-01-26 HISTORY — PX: TOTAL HIP ARTHROPLASTY: SHX124

## 2018-01-26 LAB — GLUCOSE, CAPILLARY
Glucose-Capillary: 93 mg/dL (ref 70–99)
Glucose-Capillary: 117 mg/dL — ABNORMAL HIGH (ref 70–99)
Glucose-Capillary: 133 mg/dL — ABNORMAL HIGH (ref 70–99)
Glucose-Capillary: 227 mg/dL — ABNORMAL HIGH (ref 70–99)
Glucose-Capillary: 117 mg/dL — ABNORMAL HIGH (ref 70–99)

## 2018-01-26 LAB — TYPE AND SCREEN
ABO/RH(D): A POS
Antibody Screen: NEGATIVE

## 2018-01-26 SURGERY — ARTHROPLASTY, HIP, TOTAL, ANTERIOR APPROACH
Anesthesia: General | Site: Hip | Laterality: Left

## 2018-01-26 MED ORDER — METHOCARBAMOL 500 MG IVPB - SIMPLE MED
500.0000 mg | Freq: Four times a day (QID) | INTRAVENOUS | Status: DC | PRN
  Administered 2018-01-26: 500 mg via INTRAVENOUS
  Filled 2018-01-26: qty 50

## 2018-01-26 MED ORDER — PHENOL 1.4 % MT LIQD: 1.0000 | OROMUCOSAL | Status: DC | PRN

## 2018-01-26 MED ORDER — CLOPIDOGREL BISULFATE 75 MG PO TABS
75.0000 mg | ORAL_TABLET | Freq: Every day | ORAL | Status: DC
  Administered 2018-01-27 – 2018-01-28 (×2): 75 mg via ORAL
  Filled 2018-01-26 (×2): qty 1

## 2018-01-26 MED ORDER — SIMVASTATIN 20 MG PO TABS
20.0000 mg | ORAL_TABLET | Freq: Every day | ORAL | Status: DC
  Administered 2018-01-26 – 2018-01-27 (×2): 20 mg via ORAL
  Filled 2018-01-26 (×2): qty 1

## 2018-01-26 MED ORDER — DEXAMETHASONE SODIUM PHOSPHATE 10 MG/ML IJ SOLN
8.0000 mg | Freq: Once | INTRAMUSCULAR | Status: AC
  Administered 2018-01-26: 8 mg via INTRAVENOUS

## 2018-01-26 MED ORDER — GUAIFENESIN ER 600 MG PO TB12: 1200.0000 mg | ORAL_TABLET | Freq: Every day | ORAL | Status: DC | PRN

## 2018-01-26 MED ORDER — ONDANSETRON HCL 4 MG/2ML IJ SOLN
INTRAMUSCULAR | Status: DC | PRN
  Administered 2018-01-26: 4 mg via INTRAVENOUS

## 2018-01-26 MED ORDER — LACTATED RINGERS IV SOLN
INTRAVENOUS | Status: DC
  Administered 2018-01-26 (×2): via INTRAVENOUS

## 2018-01-26 MED ORDER — FLEET ENEMA 7-19 GM/118ML RE ENEM: 1.0000 | ENEMA | Freq: Once | RECTAL | Status: DC | PRN

## 2018-01-26 MED ORDER — SAXAGLIPTIN-METFORMIN ER 2.5-1000 MG PO TB24: 1.0000 | ORAL_TABLET | Freq: Two times a day (BID) | ORAL | Status: DC

## 2018-01-26 MED ORDER — HYDROCODONE-ACETAMINOPHEN 5-325 MG PO TABS
1.0000 | ORAL_TABLET | ORAL | Status: DC | PRN
  Administered 2018-01-26 (×3): 1 via ORAL
  Administered 2018-01-28: 2 via ORAL
  Administered 2018-01-28: 1 via ORAL
  Filled 2018-01-26: qty 1
  Filled 2018-01-26: qty 2
  Filled 2018-01-26 (×3): qty 1
  Filled 2018-01-26: qty 2

## 2018-01-26 MED ORDER — NITROGLYCERIN 0.4 MG SL SUBL: 0.4000 mg | SUBLINGUAL_TABLET | SUBLINGUAL | Status: DC | PRN

## 2018-01-26 MED ORDER — CEFAZOLIN SODIUM-DEXTROSE 2-4 GM/100ML-% IV SOLN
2.0000 g | INTRAVENOUS | Status: AC
  Administered 2018-01-26: 2 g via INTRAVENOUS
  Filled 2018-01-26: qty 100

## 2018-01-26 MED ORDER — BUPIVACAINE-EPINEPHRINE (PF) 0.25% -1:200000 IJ SOLN
INTRAMUSCULAR | Status: DC | PRN
  Administered 2018-01-26: 30 mL

## 2018-01-26 MED ORDER — LORATADINE 10 MG PO TABS: 10.0000 mg | ORAL_TABLET | Freq: Every day | ORAL | Status: DC | PRN

## 2018-01-26 MED ORDER — METOCLOPRAMIDE HCL 5 MG PO TABS: 5.0000 mg | ORAL_TABLET | Freq: Three times a day (TID) | ORAL | Status: DC | PRN

## 2018-01-26 MED ORDER — PROPOFOL 10 MG/ML IV BOLUS
INTRAVENOUS | Status: AC
  Filled 2018-01-26: qty 40

## 2018-01-26 MED ORDER — METOCLOPRAMIDE HCL 5 MG/ML IJ SOLN: 5.0000 mg | Freq: Three times a day (TID) | INTRAMUSCULAR | Status: DC | PRN

## 2018-01-26 MED ORDER — SUGAMMADEX SODIUM 200 MG/2ML IV SOLN
INTRAVENOUS | Status: AC
  Filled 2018-01-26: qty 2

## 2018-01-26 MED ORDER — CHLORHEXIDINE GLUCONATE 4 % EX LIQD: 60.0000 mL | Freq: Once | CUTANEOUS | Status: DC

## 2018-01-26 MED ORDER — ROCURONIUM BROMIDE 10 MG/ML (PF) SYRINGE
PREFILLED_SYRINGE | INTRAVENOUS | Status: AC
  Filled 2018-01-26: qty 10

## 2018-01-26 MED ORDER — RAMIPRIL 10 MG PO CAPS
10.0000 mg | ORAL_CAPSULE | Freq: Every day | ORAL | Status: DC
  Administered 2018-01-27 – 2018-01-28 (×2): 10 mg via ORAL
  Filled 2018-01-26 (×2): qty 1

## 2018-01-26 MED ORDER — ASPIRIN EC 325 MG PO TBEC
325.0000 mg | DELAYED_RELEASE_TABLET | Freq: Every day | ORAL | Status: DC
  Administered 2018-01-27 – 2018-01-28 (×2): 325 mg via ORAL
  Filled 2018-01-26 (×2): qty 1

## 2018-01-26 MED ORDER — METHOCARBAMOL 500 MG IVPB - SIMPLE MED
INTRAVENOUS | Status: AC
  Filled 2018-01-26: qty 50

## 2018-01-26 MED ORDER — METFORMIN HCL 500 MG PO TABS
1000.0000 mg | ORAL_TABLET | Freq: Two times a day (BID) | ORAL | Status: DC
  Administered 2018-01-26 – 2018-01-28 (×4): 1000 mg via ORAL
  Filled 2018-01-26 (×4): qty 2

## 2018-01-26 MED ORDER — TAMSULOSIN HCL 0.4 MG PO CAPS
0.4000 mg | ORAL_CAPSULE | Freq: Every day | ORAL | Status: DC
  Administered 2018-01-26 – 2018-01-27 (×2): 0.4 mg via ORAL
  Filled 2018-01-26 (×2): qty 1

## 2018-01-26 MED ORDER — ONDANSETRON HCL 4 MG PO TABS: 4.0000 mg | ORAL_TABLET | Freq: Four times a day (QID) | ORAL | Status: DC | PRN

## 2018-01-26 MED ORDER — FENTANYL CITRATE (PF) 100 MCG/2ML IJ SOLN
INTRAMUSCULAR | Status: AC
  Filled 2018-01-26: qty 2

## 2018-01-26 MED ORDER — HYDROMORPHONE HCL 2 MG/ML IJ SOLN
INTRAMUSCULAR | Status: AC
  Filled 2018-01-26: qty 1

## 2018-01-26 MED ORDER — FENTANYL CITRATE (PF) 100 MCG/2ML IJ SOLN
INTRAMUSCULAR | Status: AC
  Administered 2018-01-26: 50 ug via INTRAVENOUS
  Filled 2018-01-26: qty 2

## 2018-01-26 MED ORDER — DEXAMETHASONE SODIUM PHOSPHATE 10 MG/ML IJ SOLN
10.0000 mg | Freq: Once | INTRAMUSCULAR | Status: AC
  Administered 2018-01-27: 10 mg via INTRAVENOUS
  Filled 2018-01-26: qty 1

## 2018-01-26 MED ORDER — HYDROMORPHONE HCL 1 MG/ML IJ SOLN
INTRAMUSCULAR | Status: DC | PRN
  Administered 2018-01-26: 0.5 mg via INTRAVENOUS
  Administered 2018-01-26: 1 mg via INTRAVENOUS
  Administered 2018-01-26: 0.5 mg via INTRAVENOUS

## 2018-01-26 MED ORDER — ACETAMINOPHEN 500 MG PO TABS
500.0000 mg | ORAL_TABLET | Freq: Four times a day (QID) | ORAL | Status: AC
  Administered 2018-01-26 – 2018-01-27 (×4): 500 mg via ORAL
  Filled 2018-01-26 (×4): qty 1

## 2018-01-26 MED ORDER — MENTHOL 3 MG MT LOZG: 1.0000 | LOZENGE | OROMUCOSAL | Status: DC | PRN

## 2018-01-26 MED ORDER — TRANEXAMIC ACID-NACL 1000-0.7 MG/100ML-% IV SOLN
1000.0000 mg | INTRAVENOUS | Status: AC
  Administered 2018-01-26: 1000 mg via INTRAVENOUS
  Filled 2018-01-26: qty 100

## 2018-01-26 MED ORDER — ONDANSETRON HCL 4 MG/2ML IJ SOLN: 4.0000 mg | Freq: Four times a day (QID) | INTRAMUSCULAR | Status: DC | PRN

## 2018-01-26 MED ORDER — DIPHENHYDRAMINE HCL 12.5 MG/5ML PO ELIX: 12.5000 mg | ORAL_SOLUTION | ORAL | Status: DC | PRN

## 2018-01-26 MED ORDER — FENTANYL CITRATE (PF) 100 MCG/2ML IJ SOLN
25.0000 ug | INTRAMUSCULAR | Status: DC | PRN
  Administered 2018-01-26 (×3): 50 ug via INTRAVENOUS

## 2018-01-26 MED ORDER — POLYETHYLENE GLYCOL 3350 17 G PO PACK: 17.0000 g | PACK | Freq: Every day | ORAL | Status: DC | PRN

## 2018-01-26 MED ORDER — TRAMADOL HCL 50 MG PO TABS
50.0000 mg | ORAL_TABLET | Freq: Four times a day (QID) | ORAL | Status: DC | PRN
  Administered 2018-01-27 (×2): 100 mg via ORAL
  Filled 2018-01-26 (×2): qty 2

## 2018-01-26 MED ORDER — STERILE WATER FOR IRRIGATION IR SOLN
Status: DC | PRN
  Administered 2018-01-26: 2000 mL

## 2018-01-26 MED ORDER — LINAGLIPTIN 5 MG PO TABS
5.0000 mg | ORAL_TABLET | Freq: Every day | ORAL | Status: DC
  Administered 2018-01-27 – 2018-01-28 (×2): 5 mg via ORAL
  Filled 2018-01-26 (×2): qty 1

## 2018-01-26 MED ORDER — INSULIN ASPART 100 UNIT/ML ~~LOC~~ SOLN
0.0000 [IU] | Freq: Three times a day (TID) | SUBCUTANEOUS | Status: DC
  Administered 2018-01-26: 5 [IU] via SUBCUTANEOUS
  Administered 2018-01-26: 2 [IU] via SUBCUTANEOUS
  Administered 2018-01-27: 3 [IU] via SUBCUTANEOUS
  Administered 2018-01-27: 2 [IU] via SUBCUTANEOUS

## 2018-01-26 MED ORDER — DEXAMETHASONE SODIUM PHOSPHATE 10 MG/ML IJ SOLN
INTRAMUSCULAR | Status: AC
  Filled 2018-01-26: qty 1

## 2018-01-26 MED ORDER — PANTOPRAZOLE SODIUM 40 MG PO TBEC
40.0000 mg | DELAYED_RELEASE_TABLET | Freq: Every day | ORAL | Status: DC
  Administered 2018-01-27 – 2018-01-28 (×2): 40 mg via ORAL
  Filled 2018-01-26 (×2): qty 1

## 2018-01-26 MED ORDER — CEFAZOLIN SODIUM-DEXTROSE 2-4 GM/100ML-% IV SOLN
2.0000 g | Freq: Four times a day (QID) | INTRAVENOUS | Status: AC
  Administered 2018-01-26 (×2): 2 g via INTRAVENOUS
  Filled 2018-01-26 (×2): qty 100

## 2018-01-26 MED ORDER — LIDOCAINE 2% (20 MG/ML) 5 ML SYRINGE
INTRAMUSCULAR | Status: AC
  Filled 2018-01-26: qty 5

## 2018-01-26 MED ORDER — BUPIVACAINE-EPINEPHRINE (PF) 0.25% -1:200000 IJ SOLN
INTRAMUSCULAR | Status: AC
  Filled 2018-01-26: qty 30

## 2018-01-26 MED ORDER — MORPHINE SULFATE (PF) 2 MG/ML IV SOLN: 0.5000 mg | INTRAVENOUS | Status: DC | PRN

## 2018-01-26 MED ORDER — SODIUM CHLORIDE 0.9 % IV SOLN
INTRAVENOUS | Status: DC
  Administered 2018-01-26 (×2): via INTRAVENOUS

## 2018-01-26 MED ORDER — 0.9 % SODIUM CHLORIDE (POUR BTL) OPTIME
TOPICAL | Status: DC | PRN
  Administered 2018-01-26: 1000 mL

## 2018-01-26 MED ORDER — SUGAMMADEX SODIUM 200 MG/2ML IV SOLN
INTRAVENOUS | Status: DC | PRN
  Administered 2018-01-26: 200 mg via INTRAVENOUS

## 2018-01-26 MED ORDER — METHOCARBAMOL 500 MG PO TABS
500.0000 mg | ORAL_TABLET | Freq: Four times a day (QID) | ORAL | Status: DC | PRN
  Administered 2018-01-27: 500 mg via ORAL
  Filled 2018-01-26: qty 1

## 2018-01-26 MED ORDER — AMLODIPINE BESYLATE 5 MG PO TABS
5.0000 mg | ORAL_TABLET | Freq: Every day | ORAL | Status: DC
  Administered 2018-01-26 – 2018-01-27 (×2): 5 mg via ORAL
  Filled 2018-01-26 (×2): qty 1

## 2018-01-26 MED ORDER — ACETAMINOPHEN 10 MG/ML IV SOLN
1000.0000 mg | Freq: Four times a day (QID) | INTRAVENOUS | Status: DC
  Administered 2018-01-26: 1000 mg via INTRAVENOUS
  Filled 2018-01-26: qty 100

## 2018-01-26 MED ORDER — ONDANSETRON HCL 4 MG/2ML IJ SOLN
INTRAMUSCULAR | Status: AC
  Filled 2018-01-26: qty 2

## 2018-01-26 MED ORDER — DOCUSATE SODIUM 100 MG PO CAPS
100.0000 mg | ORAL_CAPSULE | Freq: Two times a day (BID) | ORAL | Status: DC
  Administered 2018-01-26 – 2018-01-28 (×4): 100 mg via ORAL
  Filled 2018-01-26 (×4): qty 1

## 2018-01-26 MED ORDER — BISACODYL 10 MG RE SUPP: 10.0000 mg | Freq: Every day | RECTAL | Status: DC | PRN

## 2018-01-26 SURGICAL SUPPLY — 42 items
BAG DECANTER FOR FLEXI CONT (MISCELLANEOUS) ×2 IMPLANT
BAG ZIPLOCK 12X15 (MISCELLANEOUS) IMPLANT
BLADE SAG 18X100X1.27 (BLADE) ×2 IMPLANT
BLADE SURG SZ10 CARB STEEL (BLADE) ×4 IMPLANT
COVER PERINEAL POST (MISCELLANEOUS) ×2 IMPLANT
COVER SURGICAL LIGHT HANDLE (MISCELLANEOUS) ×2 IMPLANT
COVER WAND RF STERILE (DRAPES) IMPLANT
CUP ACET PINNACLE SECTR 50MM (Hips) ×1 IMPLANT
DECANTER SPIKE VIAL GLASS SM (MISCELLANEOUS) ×2 IMPLANT
DRAPE STERI IOBAN 125X83 (DRAPES) ×2 IMPLANT
DRAPE U-SHAPE 47X51 STRL (DRAPES) ×4 IMPLANT
DRSG ADAPTIC 3X8 NADH LF (GAUZE/BANDAGES/DRESSINGS) ×2 IMPLANT
DRSG MEPILEX BORDER 4X4 (GAUZE/BANDAGES/DRESSINGS) ×2 IMPLANT
DRSG MEPILEX BORDER 4X8 (GAUZE/BANDAGES/DRESSINGS) ×2 IMPLANT
DURAPREP 26ML APPLICATOR (WOUND CARE) ×2 IMPLANT
ELECT REM PT RETURN 15FT ADLT (MISCELLANEOUS) ×2 IMPLANT
EVACUATOR 1/8 PVC DRAIN (DRAIN) ×2 IMPLANT
GLOVE BIO SURGEON STRL SZ8 (GLOVE) ×2 IMPLANT
GLOVE BIOGEL PI IND STRL 6.5 (GLOVE) ×1 IMPLANT
GLOVE BIOGEL PI IND STRL 8 (GLOVE) ×1 IMPLANT
GLOVE BIOGEL PI INDICATOR 6.5 (GLOVE) ×1
GLOVE BIOGEL PI INDICATOR 8 (GLOVE) ×1
GLOVE SURG SS PI 6.5 STRL IVOR (GLOVE) ×4 IMPLANT
GOWN STRL REUS W/TWL LRG LVL3 (GOWN DISPOSABLE) ×4 IMPLANT
GOWN STRL REUS W/TWL XL LVL3 (GOWN DISPOSABLE) ×2 IMPLANT
HEAD FEM STD 32X+5 STRL (Hips) ×2 IMPLANT
HOLDER FOLEY CATH W/STRAP (MISCELLANEOUS) ×2 IMPLANT
LINER MARATHON 32 50 (Hips) ×2 IMPLANT
MANIFOLD NEPTUNE II (INSTRUMENTS) ×2 IMPLANT
PACK ANTERIOR HIP CUSTOM (KITS) ×2 IMPLANT
PINNACLE SECTOR CUP 50MM (Hips) ×2 IMPLANT
STEM FEMORAL SZ5 HIGH ACTIS (Nail) ×2 IMPLANT
STRIP CLOSURE SKIN 1/2X4 (GAUZE/BANDAGES/DRESSINGS) ×4 IMPLANT
SUT ETHIBOND NAB CT1 #1 30IN (SUTURE) ×2 IMPLANT
SUT MNCRL AB 4-0 PS2 18 (SUTURE) ×2 IMPLANT
SUT STRATAFIX 0 PDS 27 VIOLET (SUTURE) ×2
SUT VIC AB 2-0 CT1 27 (SUTURE) ×2
SUT VIC AB 2-0 CT1 TAPERPNT 27 (SUTURE) ×2 IMPLANT
SUTURE STRATFX 0 PDS 27 VIOLET (SUTURE) ×1 IMPLANT
SYR 50ML LL SCALE MARK (SYRINGE) IMPLANT
TRAY FOLEY MTR SLVR 16FR STAT (SET/KITS/TRAYS/PACK) ×2 IMPLANT
YANKAUER SUCT BULB TIP 10FT TU (MISCELLANEOUS) ×2 IMPLANT

## 2018-01-26 NOTE — Anesthesia Postprocedure Evaluation (Signed)
Anesthesia Post Note  Patient: ADAEL CULBREATH  Procedure(s) Performed: TOTAL HIP ARTHROPLASTY ANTERIOR APPROACH (Left Hip)     Patient location during evaluation: PACU Anesthesia Type: General Level of consciousness: awake Pain management: pain level controlled Vital Signs Assessment: post-procedure vital signs reviewed and stable Respiratory status: spontaneous breathing Cardiovascular status: stable Postop Assessment: no apparent nausea or vomiting Anesthetic complications: no    Last Vitals:  Vitals:   01/26/18 1045 01/26/18 1100  BP: (!) 147/60 (!) 146/60  Pulse: 65 72  Resp: 15 16  Temp:  (!) 36.3 C  SpO2: 100% 100%    Last Pain:  Vitals:   01/26/18 1100  PainSc: 4     LLE Motor Response: Purposeful movement (01/26/18 1100) LLE Sensation: Full sensation (01/26/18 1100) RLE Motor Response: Purposeful movement (01/26/18 1100) RLE Sensation: Full sensation (01/26/18 1100) L Sensory Level: S1-Sole of foot, small toes (01/26/18 1100) R Sensory Level: S1-Sole of foot, small toes (01/26/18 1100)  Varina Hulon

## 2018-01-26 NOTE — Interval H&P Note (Signed)
History and Physical Interval Note:  01/26/2018 6:32 AM  [name] Ray  has presented today for surgery, with the diagnosis of left hip osteoarthritis  The various methods of treatment have been discussed with the patient and family. After consideration of risks, benefits and other options for treatment, the patient has consented to  Procedure(s) with comments: [place] (Left) - 139min as a surgical intervention .  The patient's history has been reviewed, patient examined, no change in status, stable for surgery.  I have reviewed the patient's chart and labs.  Questions were answered to the patient's satisfaction.     Pilar Plate Shontell Prosser

## 2018-01-26 NOTE — Transfer of Care (Signed)
Immediate Anesthesia Transfer of Care Note  Patient: DEVERY MURGIA  Procedure(s) Performed: TOTAL HIP ARTHROPLASTY ANTERIOR APPROACH (Left Hip)  Patient Location: PACU  Anesthesia Type:General  Level of Consciousness: awake, alert  and oriented  Airway & Oxygen Therapy: Patient Spontanous Breathing and Patient connected to face mask oxygen  Post-op Assessment: Report given to RN and Post -op Vital signs reviewed and stable  Post vital signs: Reviewed and stable  Last Vitals:  Vitals Value Taken Time  BP    Temp    Pulse 63 01/26/2018 10:02 AM  Resp    SpO2 100 % 01/26/2018 10:02 AM  Vitals shown include unvalidated device data.  Last Pain:  Vitals:   01/26/18 0703  PainSc: 0-No pain         Complications: No apparent anesthesia complications

## 2018-01-26 NOTE — Op Note (Signed)
OPERATIVE REPORT- TOTAL HIP ARTHROPLASTY   PREOPERATIVE DIAGNOSIS: Osteoarthritis of the Left hip.   POSTOPERATIVE DIAGNOSIS: Osteoarthritis of the Left  hip.   PROCEDURE: Left total hip arthroplasty, anterior approach.   SURGEON: [name], MD   ASSISTANT: Ardeen Jourdain, PA-C  ANESTHESIA:  General  ESTIMATED BLOOD LOSS:-150 mL    DRAINS: Hemovac x1.   COMPLICATIONS: None   CONDITION: PACU - hemodynamically stable.   BRIEF CLINICAL NOTE: [name] Ray is a 85 y.o. male who has advanced end-  stage arthritis of their Left  hip with progressively worsening pain and  dysfunction.The patient has failed nonoperative management and presents for  total hip arthroplasty.   PROCEDURE IN DETAIL: After successful administration of spinal  anesthetic, the traction boots for the [facility] bed were placed on both  feet and the patient was placed onto the [facility] bed, boots placed into the leg  holders. The Left hip was then isolated from the perineum with plastic  drapes and prepped and draped in the usual sterile fashion. ASIS and  greater trochanter were marked and a oblique incision was made, starting  at about 1 cm lateral and 2 cm distal to the ASIS and coursing towards  the anterior cortex of the femur. The skin was cut with a 10 blade  through subcutaneous tissue to the level of the fascia overlying the  tensor fascia lata muscle. The fascia was then incised in line with the  incision at the junction of the anterior third and posterior 2/3rd. The  muscle was teased off the fascia and then the interval between the TFL  and the rectus was developed. The Hohmann retractor was then placed at  the top of the femoral neck over the capsule. The vessels overlying the  capsule were cauterized and the fat on top of the capsule was removed.  A Hohmann retractor was then placed anterior underneath the rectus  femoris to give exposure to the entire anterior capsule. A T-shaped   capsulotomy was performed. The edges were tagged and the femoral head  was identified.       Osteophytes are removed off the superior acetabulum.  The femoral neck was then cut in situ with an oscillating saw. Traction  was then applied to the left lower extremity utilizing the [facility]  traction. The femoral head was then removed. Retractors were placed  around the acetabulum and then circumferential removal of the labrum was  performed. Osteophytes were also removed. Reaming starts at 47 mm to  medialize and  Increased in 2 mm increments to 49 mm. We reamed in  approximately 40 degrees of abduction, 20 degrees anteversion. A 50 mm  pinnacle acetabular shell was then impacted in anatomic position under  fluoroscopic guidance with excellent purchase. We did not need to place  any additional dome screws. A 32 mm neutral + 4 marathon liner was then  placed into the acetabular shell.       The femoral lift was then placed along the lateral aspect of the femur  just distal to the vastus ridge. The leg was  externally rotated and capsule  was stripped off the inferior aspect of the femoral neck down to the  level of the lesser trochanter, this was done with electrocautery. The femur was lifted after this was performed. The  leg was then placed in an extended and adducted position essentially delivering the femur. We also removed the capsule superiorly and the piriformis from the piriformis  fossa to gain excellent exposure of the  proximal femur. Rongeur was used to remove some cancellous bone to get  into the lateral portion of the proximal femur for placement of the  initial starter reamer. The starter broaches was placed  the starter broach  and was shown to go down the center of the canal. Broaching  with the Actis system was then performed starting at size 0  coursing  Up to size 5. A size 5 had excellent torsional and rotational  and axial stability. The trial high offset neck was then placed   with a 32 + 5 trial head. The hip was then reduced. We confirmed that  the stem was in the canal both on AP and lateral x-rays. It also has excellent sizing. The hip was reduced with outstanding stability through full extension and full external rotation.. AP pelvis was taken and the leg lengths were measured and found to be equal. Hip was then dislocated again and the femoral head and neck removed. The  femoral broach was removed. Size 5 Actis stem with a high offset  neck was then impacted into the femur following native anteversion. Has  excellent purchase in the canal. Excellent torsional and rotational and  axial stability. It is confirmed to be in the canal on AP and lateral  fluoroscopic views. The 32 + 5 metal head was placed and the hip  reduced with outstanding stability. Again AP pelvis was taken and it  confirmed that the leg lengths were equal. The wound was then copiously  irrigated with saline solution and the capsule reattached and repaired  with Ethibond suture. 30 ml of .25% Bupivicaine was  injected into the capsule and into the edge of the tensor fascia lata as well as subcutaneous tissue. The fascia overlying the tensor fascia lata was then closed with a running #1 V-Loc. Subcu was closed with interrupted 2-0 Vicryl and subcuticular running 4-0 Monocryl. Incision was cleaned  and dried. Steri-Strips and a bulky sterile dressing applied. Hemovac  drain was hooked to suction and then the patient was awakened and transported to  recovery in stable condition.        Please note that a surgical assistant was a medical necessity for this procedure to perform it in a safe and expeditious manner. Assistant was necessary to provide appropriate retraction of vital neurovascular structures and to prevent femoral fracture and allow for anatomic placement of the prosthesis.  [name], M.D.

## 2018-01-26 NOTE — Discharge Instructions (Signed)
°Dr. Frank Aluisio °Total Joint Specialist °[facility] °[address] °[place], [place] [address] °[phone] ° °ANTERIOR APPROACH TOTAL HIP REPLACEMENT POSTOPERATIVE DIRECTIONS ° ° °Hip Rehabilitation, Guidelines Following Surgery  °The results of a hip operation are greatly improved after range of motion and muscle strengthening exercises. Follow all safety measures which are given to protect your hip. If any of these exercises cause increased pain or swelling in your joint, decrease the amount until you are comfortable again. Then slowly increase the exercises. Call your caregiver if you have problems or questions.  ° °HOME CARE INSTRUCTIONS  °• Remove items at home which could result in a fall. This includes throw rugs or furniture in walking pathways.  °· ICE to the affected hip every three hours for 30 minutes at a time and then as needed for pain and swelling.  Continue to use ice on the hip for pain and swelling from surgery. You may notice swelling that will progress down to the foot and ankle.  This is normal after surgery.  Elevate the leg when you are not up walking on it.   °· Continue to use the breathing machine which will help keep your temperature down.  It is common for your temperature to cycle up and down following surgery, especially at night when you are not up moving around and exerting yourself.  The breathing machine keeps your lungs expanded and your temperature down. ° °DIET °You may resume your previous home diet once your are discharged from the hospital. ° °DRESSING / WOUND CARE / SHOWERING °You may shower 3 days after surgery, but keep the wounds dry during showering.  You may use an occlusive plastic wrap (Press'n Seal for example), NO SOAKING/SUBMERGING IN THE BATHTUB.  If the bandage gets wet, change with a clean dry gauze.  If the incision gets wet, pat the wound dry with a clean towel. °You may start showering once you are discharged home but do not submerge the  incision under water. Just pat the incision dry and apply a dry gauze dressing on daily. °Change the surgical dressing daily and reapply a dry dressing each time. ° °ACTIVITY °Walk with your walker as instructed. °Use walker as long as suggested by your caregivers. °Avoid periods of inactivity such as sitting longer than an hour when not asleep. This helps prevent blood clots.  °You may resume a sexual relationship in one month or when given the OK by your doctor.  °You may return to work once you are cleared by your doctor.  °Do not drive a car for 6 weeks or until released by you surgeon.  °Do not drive while taking narcotics. ° °WEIGHT BEARING °Weight bearing as tolerated with assist device (walker, cane, etc) as directed, use it as long as suggested by your surgeon or therapist, typically at least 4-6 weeks. ° °POSTOPERATIVE CONSTIPATION PROTOCOL °Constipation - defined medically as fewer than three stools per week and severe constipation as less than one stool per week. ° °One of the most common issues patients have following surgery is constipation.  Even if you have a regular bowel pattern at home, your normal regimen is likely to be disrupted due to multiple reasons following surgery.  Combination of anesthesia, postoperative narcotics, change in appetite and fluid intake all can affect your bowels.  In order to avoid complications following surgery, here are some recommendations in order to help you during your recovery period. ° °Colace (docusate) - Pick up an over-the-counter form   of Colace or another stool softener and take twice a day as long as you are requiring postoperative pain medications.  Take with a full glass of water daily.  If you experience loose stools or diarrhea, hold the colace until you stool forms back up.  If your symptoms do not get better within 1 week or if they get worse, check with your doctor. ° °Dulcolax (bisacodyl) - Pick up over-the-counter and take as directed by the product  packaging as needed to assist with the movement of your bowels.  Take with a full glass of water.  Use this product as needed if not relieved by Colace only.  ° °MiraLax (polyethylene glycol) - Pick up over-the-counter to have on hand.  MiraLax is a solution that will increase the amount of water in your bowels to assist with bowel movements.  Take as directed and can mix with a glass of water, juice, soda, coffee, or tea.  Take if you go more than two days without a movement. °Do not use MiraLax more than once per day. Call your doctor if you are still constipated or irregular after using this medication for 7 days in a row. ° °If you continue to have problems with postoperative constipation, please contact the office for further assistance and recommendations.  If you experience "the worst abdominal pain ever" or develop nausea or vomiting, please contact the office immediatly for further recommendations for treatment. ° °ITCHING ° If you experience itching with your medications, try taking only a single pain pill, or even half a pain pill at a time.  You can also use Benadryl over the counter for itching or also to help with sleep.  ° °TED HOSE STOCKINGS °Wear the elastic stockings on both legs for three weeks following surgery during the day but you may remove then at night for sleeping. ° °MEDICATIONS °See your medication summary on the “After Visit Summary” that the nursing staff will review with you prior to discharge.  You may have some home medications which will be placed on hold until you complete the course of blood thinner medication.  It is important for you to complete the blood thinner medication as prescribed by your surgeon.  Continue your approved medications as instructed at time of discharge. ° °PRECAUTIONS °If you experience chest pain or shortness of breath - call 911 immediately for transfer to the hospital emergency department.  °If you develop a fever greater that 101 F, purulent drainage  from wound, increased redness or drainage from wound, foul odor from the wound/dressing, or calf pain - CONTACT YOUR SURGEON.   °                                                °FOLLOW-UP APPOINTMENTS °Make sure you keep all of your appointments after your operation with your surgeon and caregivers. You should call the office at the above phone number and make an appointment for approximately two weeks after the date of your surgery or on the date instructed by your surgeon outlined in the "After Visit Summary". ° °RANGE OF MOTION AND STRENGTHENING EXERCISES  °These exercises are designed to help you keep full movement of your hip joint. Follow your caregiver's or physical therapist's instructions. Perform all exercises about fifteen times, three times per day or as directed. Exercise both hips, even if you have   had only one joint replacement. These exercises can be done on a training (exercise) mat, on the floor, on a table or on a bed. Use whatever works the best and is most comfortable for you. Use music or television while you are exercising so that the exercises are a pleasant break in your day. This will make your life better with the exercises acting as a break in routine you can look forward to.  °• Lying on your back, slowly slide your foot toward your buttocks, raising your knee up off the floor. Then slowly slide your foot back down until your leg is straight again.  °• Lying on your back spread your legs as far apart as you can without causing discomfort.  °• Lying on your side, raise your upper leg and foot straight up from the floor as far as is comfortable. Slowly lower the leg and repeat.  °• Lying on your back, tighten up the muscle in the front of your thigh (quadriceps muscles). You can do this by keeping your leg straight and trying to raise your heel off the floor. This helps strengthen the largest muscle supporting your knee.  °• Lying on your back, tighten up the muscles of your buttocks both  with the legs straight and with the knee bent at a comfortable angle while keeping your heel on the floor.  ° °IF YOU ARE TRANSFERRED TO A SKILLED REHAB FACILITY °If the patient is transferred to a skilled rehab facility following release from the hospital, a list of the current medications will be sent to the facility for the patient to continue.  When discharged from the skilled rehab facility, please have the facility set up the patient's Home Health Physical Therapy prior to being released. Also, the skilled facility will be responsible for providing the patient with their medications at time of release from the facility to include their pain medication, the muscle relaxants, and their blood thinner medication. If the patient is still at the rehab facility at time of the two week follow up appointment, the skilled rehab facility will also need to assist the patient in arranging follow up appointment in our office and any transportation needs. ° °MAKE SURE YOU:  °• Understand these instructions.  °• Get help right away if you are not doing well or get worse.  ° ° °Pick up stool softner and laxative for home use following surgery while on pain medications. °Do not submerge incision under water. °Please use good hand washing techniques while changing dressing each day. °May shower starting three days after surgery. °Please use a clean towel to pat the incision dry following showers. °Continue to use ice for pain and swelling after surgery. °Do not use any lotions or creams on the incision until instructed by your surgeon. ° °

## 2018-01-26 NOTE — Anesthesia Procedure Notes (Signed)
Procedure Name: Intubation Date/Time: 01/26/2018 8:33 AM Performed by: Devante Capano D, CRNA Pre-anesthesia Checklist: Patient identified, Emergency Drugs available, Suction available and Patient being monitored Patient Re-evaluated:Patient Re-evaluated prior to induction Oxygen Delivery Method: Circle system utilized Preoxygenation: Pre-oxygenation with 100% oxygen Induction Type: IV induction Ventilation: Mask ventilation without difficulty Laryngoscope Size: Mac and 3 Grade View: Grade I Tube type: Oral Tube size: 7.5 mm Number of attempts: 1 Airway Equipment and Method: Stylet and Oral airway Placement Confirmation: ETT inserted through vocal cords under direct vision,  positive ETCO2 and breath sounds checked- equal and bilateral Secured at: 22 cm Tube secured with: Tape Dental Injury: Teeth and Oropharynx as per pre-operative assessment

## 2018-01-26 NOTE — Evaluation (Signed)
Physical Therapy Evaluation Patient Details Name: [name] Ray MRN: [id number] DOB: July 22, 1933 Today's Date: 01/26/2018   History of Present Illness  L DA-THA, h/o spinal surgery August 2019 with residual numbness R medial foot  Clinical Impression  Pt is s/p THA resulting in the deficits listed below (see PT Problem List). Pt ambulated 44' with RW, no loss of balance. Initiated HEP. Good progress expected.  Pt will benefit from skilled PT to increase their independence and safety with mobility to allow discharge to the venue listed below.      Follow Up Recommendations Follow surgeon's recommendation for DC plan and follow-up therapies    Equipment Recommendations  None recommended by PT    Recommendations for Other Services       Precautions / Restrictions Precautions Precautions: Fall Precaution Comments: reports several episodes of tripping but no falls in past 1 year Restrictions Weight Bearing Restrictions: No LLE Weight Bearing: Weight bearing as tolerated      Mobility  Bed Mobility Overal bed mobility: Needs Assistance Bed Mobility: Supine to Sit     Supine to sit: Min assist     General bed mobility comments: min A to support LLE to edge of bed  Transfers Overall transfer level: Needs assistance Equipment used: Rolling walker (2 wheeled) Transfers: Sit to/from Stand Sit to Stand: Min assist;From elevated surface         General transfer comment: min A to rise, VCs hand placement  Ambulation/Gait Ambulation/Gait assistance: Min guard Gait Distance (Feet): 80 Feet Assistive device: Rolling walker (2 wheeled) Gait Pattern/deviations: Step-to pattern;Decreased step length - left;Decreased step length - right Gait velocity: WFL   General Gait Details: VCs sequencing, no loss of balance  Stairs            Wheelchair Mobility    Modified Rankin (Stroke Patients Only)       Balance Overall balance assessment: Modified Independent                                            Pertinent Vitals/Pain Pain Assessment: 0-10 Pain Score: 2  Pain Location: L hip Pain Descriptors / Indicators: Sore Pain Intervention(s): Limited activity within patient's tolerance;Monitored during session;Premedicated before session;Ice applied    Home Living Family/patient expects to be discharged to:: Private residence Living Arrangements: Spouse/significant other Available Help at Discharge: Family;Available 24 hours/day Type of Home: House Home Access: Ramped entrance     Home Layout: Two level;Able to live on main level with bedroom/bathroom Home Equipment: Gilford Rile - 2 wheels;Cane - single point;Bedside commode;Shower seat;Grab bars - toilet;Adaptive equipment      Prior Function Level of Independence: Independent with assistive device(s)         Comments: used SPC     Hand Dominance   Dominant Hand: Right    Extremity/Trunk Assessment   Upper Extremity Assessment Upper Extremity Assessment: Overall WFL for tasks assessed    Lower Extremity Assessment Lower Extremity Assessment: LLE deficits/detail;RLE deficits/detail RLE Deficits / Details: chronic numbness medial foot 2* back surgery in August 2019 RLE Sensation: decreased light touch LLE Deficits / Details: hip AAROM decreased ~50% 2* pain, knee ext +3/5 LLE Sensation: WNL    Cervical / Trunk Assessment Cervical / Trunk Assessment: Normal  Communication   Communication: No difficulties  Cognition Arousal/Alertness: Awake/alert Behavior During Therapy: WFL for tasks assessed/performed Overall Cognitive Status: Within Functional Limits  for tasks assessed                                        General Comments      Exercises Total Joint Exercises Ankle Circles/Pumps: AROM;Both;10 reps Heel Slides: AAROM;Left;10 reps;Supine Hip ABduction/ADduction: AAROM;Left;10 reps;Supine Long Arc Quad: AROM;Left;10 reps;Seated    Assessment/Plan    PT Assessment Patient needs continued PT services  PT Problem List Decreased strength;Decreased range of motion;Decreased activity tolerance;Decreased mobility;Decreased balance;Decreased knowledge of use of DME;Pain       PT Treatment Interventions DME instruction;Gait training;Therapeutic exercise;Therapeutic activities;Patient/family education    PT Goals (Current goals can be found in the Care Plan section)  Acute Rehab PT Goals Patient Stated Goal: walk farther PT Goal Formulation: With patient Time For Goal Achievement: 02/02/18 Potential to Achieve Goals: Good    Frequency 7X/week   Barriers to discharge        Co-evaluation               AM-PAC PT "6 Clicks" Mobility  Outcome Measure Help needed turning from your back to your side while in a flat bed without using bedrails?: A Little Help needed moving from lying on your back to sitting on the side of a flat bed without using bedrails?: A Little Help needed moving to and from a bed to a chair (including a wheelchair)?: A Little Help needed standing up from a chair using your arms (e.g., wheelchair or bedside chair)?: A Little Help needed to walk in hospital room?: A Little Help needed climbing 3-5 steps with a railing? : A Little 6 Click Score: 18    End of Session Equipment Utilized During Treatment: Gait belt Activity Tolerance: Patient tolerated treatment well Patient left: in chair;with call bell/phone within reach;with chair alarm set;with family/visitor present Nurse Communication: Mobility status PT Visit Diagnosis: Muscle weakness (generalized) (M62.81);Difficulty in walking, not elsewhere classified (R26.2);Pain Pain - Right/Left: Left Pain - part of body: Hip    Time: 5035-4656 PT Time Calculation (min) (ACUTE ONLY): 21 min   Charges:   PT Evaluation $PT Eval Low Complexity: 1 Low         Blondell Reveal Kistler PT 01/26/2018  Acute Rehabilitation Services Pager  [phone] Office [phone]

## 2018-01-27 LAB — CBC
HCT: 29.9 % — ABNORMAL LOW (ref 39.0–52.0)
Hemoglobin: 9.6 g/dL — ABNORMAL LOW (ref 13.0–17.0)
MCH: 30.1 pg (ref 26.0–34.0)
MCHC: 32.1 g/dL (ref 30.0–36.0)
MCV: 93.7 fL (ref 80.0–100.0)
Platelets: 156 10*3/uL (ref 150–400)
RBC: 3.19 MIL/uL — ABNORMAL LOW (ref 4.22–5.81)
RDW: 13.4 % (ref 11.5–15.5)
WBC: 11.8 10*3/uL — ABNORMAL HIGH (ref 4.0–10.5)
nRBC: 0 % (ref 0.0–0.2)

## 2018-01-27 LAB — BASIC METABOLIC PANEL
Anion gap: 9 (ref 5–15)
BUN: 18 mg/dL (ref 8–23)
CO2: 25 mmol/L (ref 22–32)
Calcium: 8.2 mg/dL — ABNORMAL LOW (ref 8.9–10.3)
Chloride: 106 mmol/L (ref 98–111)
Creatinine, Ser: 0.92 mg/dL (ref 0.61–1.24)
GFR calc Af Amer: >60 mL/min (ref >60)
GFR calc non Af Amer: >60 mL/min (ref >60)
Glucose, Bld: 137 mg/dL — ABNORMAL HIGH (ref 70–99)
Potassium: 4.5 mmol/L (ref 3.5–5.1)
Sodium: 140 mmol/L (ref 135–145)

## 2018-01-27 LAB — GLUCOSE, CAPILLARY
Glucose-Capillary: 107 mg/dL — ABNORMAL HIGH (ref 70–99)
Glucose-Capillary: 136 mg/dL — ABNORMAL HIGH (ref 70–99)
Glucose-Capillary: 154 mg/dL — ABNORMAL HIGH (ref 70–99)
Glucose-Capillary: 144 mg/dL — ABNORMAL HIGH (ref 70–99)

## 2018-01-27 NOTE — Progress Notes (Signed)
Physical Therapy Treatment Patient Details Name: [name] Ray MRN: [id number] DOB: May 07, 1933 Today's Date: 01/27/2018    History of Present Illness L DA-THA, h/o spinal surgery August 2019 with residual numbness R medial foot    PT Comments    Pt is progressing well with mobility, he ambulated 120' with RW, no loss of balance. Expect pt will be ready to DC home tomorrow, from PT standpoint.   Follow Up Recommendations  Follow surgeon's recommendation for DC plan and follow-up therapies     Equipment Recommendations  None recommended by PT    Recommendations for Other Services       Precautions / Restrictions Precautions Precautions: Fall Precaution Comments: reports several episodes of tripping but no falls in past 1 year Restrictions Weight Bearing Restrictions: No LLE Weight Bearing: Weight bearing as tolerated    Mobility  Bed Mobility Overal bed mobility: Needs Assistance Bed Mobility: Sit to Supine       Sit to supine: Supervision   General bed mobility comments: pt used gait belt strap to support LLE  Transfers Overall transfer level: Needs assistance Equipment used: Rolling walker (2 wheeled) Transfers: Sit to/from Stand Sit to Stand: Supervision         General transfer comment:  VCs hand placement  Ambulation/Gait Ambulation/Gait assistance: Min guard Gait Distance (Feet): 120 Feet Assistive device: Rolling walker (2 wheeled) Gait Pattern/deviations: Step-to pattern;Decreased step length - left;Decreased step length - right Gait velocity: WFL   General Gait Details: VCs sequencing, no loss of balance   Stairs             Wheelchair Mobility    Modified Rankin (Stroke Patients Only)       Balance Overall balance assessment: Modified Independent                                          Cognition Arousal/Alertness: Awake/alert Behavior During Therapy: WFL for tasks assessed/performed Overall Cognitive  Status: Within Functional Limits for tasks assessed                                        Exercises      General Comments        Pertinent Vitals/Pain Pain Score: 4  Pain Location: L hip Pain Descriptors / Indicators: Sore Pain Intervention(s): Limited activity within patient's tolerance;Monitored during session;Patient requesting pain meds-RN notified;Ice applied    Home Living                      Prior Function            PT Goals (current goals can now be found in the care plan section) Acute Rehab PT Goals Patient Stated Goal: walk farther without cane, travel PT Goal Formulation: With patient/family Time For Goal Achievement: 02/02/18 Potential to Achieve Goals: Good Progress towards PT goals: Progressing toward goals    Frequency    7X/week      PT Plan Current plan remains appropriate    Co-evaluation              AM-PAC PT "6 Clicks" Mobility   Outcome Measure  Help needed turning from your back to your side while in a flat bed without using bedrails?: None Help needed moving from lying on your  back to sitting on the side of a flat bed without using bedrails?: A Little Help needed moving to and from a bed to a chair (including a wheelchair)?: A Little Help needed standing up from a chair using your arms (e.g., wheelchair or bedside chair)?: A Little Help needed to walk in hospital room?: A Little Help needed climbing 3-5 steps with a railing? : A Little 6 Click Score: 19    End of Session Equipment Utilized During Treatment: Gait belt Activity Tolerance: Patient tolerated treatment well Patient left: in chair;with family/visitor present;in bed Nurse Communication: Mobility status PT Visit Diagnosis: Muscle weakness (generalized) (M62.81);Difficulty in walking, not elsewhere classified (R26.2);Pain Pain - Right/Left: Left Pain - part of body: Hip     Time: 4287-6811 PT Time Calculation (min) (ACUTE ONLY): 20  min  Charges:  $Gait Training: 8-22 mins                    Blondell Reveal Kistler PT 01/27/2018  Acute Rehabilitation Services Pager [phone] Office [phone]

## 2018-01-27 NOTE — Progress Notes (Signed)
Subjective: 1 Day Post-Op Procedure(s) (LRB): TOTAL HIP ARTHROPLASTY ANTERIOR APPROACH (Left) Patient reports pain as mild.   Patient seen in rounds by Dr. Wynelle Link. Patient is well, and has had no acute complaints or problems other than pain in the left hip. No issues overnight. Foley catheter to be removed this AM. Denies chest pain or SOB. We will continue therapy today.   Objective: Vital signs in last 24 hours: Temp:  [97.4 F (36.3 C)-98.7 F (37.1 C)] 98.5 F (36.9 C) (01/14 9935) Pulse Rate:  [60-80] 66 (01/14 0632) Resp:  [12-18] 12 (01/14 0632) BP: (116-160)/(48-81) 133/58 (01/14 0632) SpO2:  [96 %-100 %] 96 % (01/14 7017) Weight:  [90.2 kg] 90.2 kg (01/14 0344)  Intake/Output from previous day:  Intake/Output Summary (Last 24 hours) at 01/27/2018 0727 Last data filed at 01/27/2018 0559 Gross per 24 hour  Intake 4066.96 ml  Output 3520 ml  Net 546.96 ml    Labs: Recent Labs    01/27/18 0458  HGB 9.6*   Recent Labs    01/27/18 0458  WBC 11.8*  RBC 3.19*  HCT 29.9*  PLT 156   Recent Labs    01/27/18 0458  NA 140  K 4.5  CL 106  CO2 25  BUN 18  CREATININE 0.92  GLUCOSE 137*  CALCIUM 8.2*   Exam: General - Patient is Alert and Oriented Extremity - Neurologically intact Neurovascular intact Sensation intact distally Dorsiflexion/Plantar flexion intact Dressing - dressing C/D/I Motor Function - intact, moving foot and toes well on exam.   Past Medical History:  Diagnosis Date  . Adenomatous polyp    history  . Arthritis    knees , shoulders, hands   . Atrial bigeminy    in the past  . BPH (benign prostatic hyperplasia)   . Carotid artery disease ([place])    a. Doppler, January, 2012, 40-59% bilateral stenoses;  b.  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 Colon polyps   . Coronary artery disease    acute inferior MI Jan 2002 with stent / Taxus stent LAD 2004 / Cypher stent prox LAD and cypher stent mid  circ May 2006 / nuclear stress Dec 2009 normal EF 63% / nuclear August 2011 EF 62% / inferior thining and very mild ischemia inferior and apex / catherization Sept 2011 EF 60%  mild to moderate diffuse RCA disease, moderate discrete LAD stenosis before the stent in the proximal LAD med tx rec  . Diverticulosis   . Dyslipidemia   . Ejection fraction   . Fatty liver    history  . GERD (gastroesophageal reflux disease)   . Hypertension    [facility]. - 5 stents total , last cardiac cath. 2011.  Cleared for  surgery by Dr. [name]- 04/23/2013  . IBS (irritable bowel syndrome)   . Myocardial infarction ([place]) 2002  . Numbness and tingling in hands    resolved  . Overweight(278.02)   . Prostate cancer ([place]) approx 2016   treated /w  Lupron injection x1, then radium  . Shortness of breath dyspnea   . Type II diabetes mellitus ([facility])     Assessment/Plan: 1 Day Post-Op Procedure(s) (LRB): TOTAL HIP ARTHROPLASTY ANTERIOR APPROACH (Left) Principal Problem:   OA (osteoarthritis) of hip  Estimated body mass index is 33.09 kg/m as calculated from the following:   Height as of this encounter: 5\' 5"  (1.651 m).   Weight as of this encounter: 90.2  kg. Advance diet Up with therapy  DVT Prophylaxis - Aspirin and Plavix Weight bearing as tolerated. D/C O2 and pulse ox and try on room air. Hemovac pulled without difficulty, will continue therapy.  Plan is to go Home after hospital stay. Plan for discharge tomorrow if meeting goals with therapy.  Theresa Duty, PA-C Orthopedic Surgery 01/27/2018, 7:27 AM

## 2018-01-27 NOTE — Progress Notes (Signed)
Pain level 2 throughout night. Patient ambulated to bathroom and had a BM. Vitals stable.

## 2018-01-27 NOTE — Progress Notes (Signed)
Physical Therapy Treatment Patient Details Name: [name] Ray MRN: [id number] DOB: 11/05/1933 Today's Date: 01/27/2018    History of Present Illness L DA-THA, h/o spinal surgery August 2019 with residual numbness R medial foot    PT Comments    Pt is progressing well with mobility, he ambulated 120' with RW and performed THA HEP with good technique.    Follow Up Recommendations  Follow surgeon's recommendation for DC plan and follow-up therapies     Equipment Recommendations  None recommended by PT    Recommendations for Other Services       Precautions / Restrictions Precautions Precautions: Fall Precaution Comments: reports several episodes of tripping but no falls in past 1 year Restrictions Weight Bearing Restrictions: No LLE Weight Bearing: Weight bearing as tolerated    Mobility  Bed Mobility Overal bed mobility: Needs Assistance Bed Mobility: Supine to Sit     Supine to sit: Supervision     General bed mobility comments: pt used gait belt strap to support LLE  Transfers Overall transfer level: Needs assistance Equipment used: Rolling walker (2 wheeled) Transfers: Sit to/from Stand Sit to Stand: Supervision         General transfer comment:  VCs hand placement  Ambulation/Gait Ambulation/Gait assistance: Min guard Gait Distance (Feet): 120 Feet Assistive device: Rolling walker (2 wheeled) Gait Pattern/deviations: Step-to pattern;Decreased step length - left;Decreased step length - right Gait velocity: WFL   General Gait Details: VCs sequencing, no loss of balance   Stairs             Wheelchair Mobility    Modified Rankin (Stroke Patients Only)       Balance Overall balance assessment: Modified Independent                                          Cognition Arousal/Alertness: Awake/alert Behavior During Therapy: WFL for tasks assessed/performed Overall Cognitive Status: Within Functional Limits for tasks  assessed                                        Exercises Total Joint Exercises Ankle Circles/Pumps: AROM;Both;10 reps Short Arc Quad: AROM;15 reps;Left;Supine Heel Slides: AAROM;Left;10 reps;Supine Hip ABduction/ADduction: AAROM;Left;10 reps;Supine    General Comments        Pertinent Vitals/Pain Pain Score: 7  Pain Location: L hip Pain Descriptors / Indicators: Sore Pain Intervention(s): Limited activity within patient's tolerance;Monitored during session;Premedicated before session;Ice applied    Home Living                      Prior Function            PT Goals (current goals can now be found in the care plan section) Acute Rehab PT Goals Patient Stated Goal: walk farther without cane, travel PT Goal Formulation: With patient/family Time For Goal Achievement: 02/02/18 Potential to Achieve Goals: Good Progress towards PT goals: Progressing toward goals    Frequency    7X/week      PT Plan Current plan remains appropriate    Co-evaluation              AM-PAC PT "6 Clicks" Mobility   Outcome Measure  Help needed turning from your back to your side while in a flat bed without using bedrails?:  None Help needed moving from lying on your back to sitting on the side of a flat bed without using bedrails?: A Little Help needed moving to and from a bed to a chair (including a wheelchair)?: A Little Help needed standing up from a chair using your arms (e.g., wheelchair or bedside chair)?: A Little Help needed to walk in hospital room?: A Little Help needed climbing 3-5 steps with a railing? : A Little 6 Click Score: 19    End of Session Equipment Utilized During Treatment: Gait belt Activity Tolerance: Patient tolerated treatment well Patient left: in chair;with call bell/phone within reach;with chair alarm set;with family/visitor present Nurse Communication: Mobility status PT Visit Diagnosis: Muscle weakness (generalized)  (M62.81);Difficulty in walking, not elsewhere classified (R26.2);Pain Pain - Right/Left: Left Pain - part of body: Hip     Time: 6579-0383 PT Time Calculation (min) (ACUTE ONLY): 23 min  Charges:  $Gait Training: 8-22 mins $Therapeutic Exercise: 8-22 mins                     Blondell Reveal Kistler PT 01/27/2018  Acute Rehabilitation Services Pager [phone] Office [phone]

## 2018-01-28 ENCOUNTER — Encounter (HOSPITAL_COMMUNITY): Payer: Self-pay | Admitting: Orthopedic Surgery

## 2018-01-28 LAB — CBC
HCT: 30.8 % — ABNORMAL LOW (ref 39.0–52.0)
Hemoglobin: 9.8 g/dL — ABNORMAL LOW (ref 13.0–17.0)
MCH: 29.2 pg (ref 26.0–34.0)
MCHC: 31.8 g/dL (ref 30.0–36.0)
MCV: 91.7 fL (ref 80.0–100.0)
Platelets: 149 10*3/uL — ABNORMAL LOW (ref 150–400)
RBC: 3.36 MIL/uL — ABNORMAL LOW (ref 4.22–5.81)
RDW: 13.6 % (ref 11.5–15.5)
WBC: 12.7 10*3/uL — ABNORMAL HIGH (ref 4.0–10.5)
nRBC: 0 % (ref 0.0–0.2)

## 2018-01-28 LAB — GLUCOSE, CAPILLARY
Glucose-Capillary: 108 mg/dL — ABNORMAL HIGH (ref 70–99)
Glucose-Capillary: 103 mg/dL — ABNORMAL HIGH (ref 70–99)

## 2018-01-28 LAB — BASIC METABOLIC PANEL
Anion gap: 9 (ref 5–15)
BUN: 21 mg/dL (ref 8–23)
CO2: 24 mmol/L (ref 22–32)
Calcium: 8.3 mg/dL — ABNORMAL LOW (ref 8.9–10.3)
Chloride: 104 mmol/L (ref 98–111)
Creatinine, Ser: 0.78 mg/dL (ref 0.61–1.24)
GFR calc Af Amer: >60 mL/min (ref >60)
GFR calc non Af Amer: >60 mL/min (ref >60)
Glucose, Bld: 125 mg/dL — ABNORMAL HIGH (ref 70–99)
Potassium: 4.4 mmol/L (ref 3.5–5.1)
Sodium: 137 mmol/L (ref 135–145)

## 2018-01-28 MED ORDER — HYDROCODONE-ACETAMINOPHEN 5-325 MG PO TABS: 1.0000 | ORAL_TABLET | Freq: Four times a day (QID) | ORAL | 0 refills | Status: DC | PRN

## 2018-01-28 MED ORDER — ASPIRIN 325 MG PO TBEC: 325.0000 mg | DELAYED_RELEASE_TABLET | Freq: Every day | ORAL | 0 refills | Status: AC

## 2018-01-28 MED ORDER — METHOCARBAMOL 500 MG PO TABS: 500.0000 mg | ORAL_TABLET | Freq: Four times a day (QID) | ORAL | 0 refills | Status: DC | PRN

## 2018-01-28 MED ORDER — TRAMADOL HCL 50 MG PO TABS: 50.0000 mg | ORAL_TABLET | Freq: Four times a day (QID) | ORAL | 0 refills | Status: DC | PRN

## 2018-01-28 NOTE — Progress Notes (Signed)
Subjective: 2 Days Post-Op Procedure(s) (LRB): TOTAL HIP ARTHROPLASTY ANTERIOR APPROACH (Left) Patient reports pain as mild.   Patient seen in rounds for Dr. Wynelle Link. Patient is well, and has had no acute complaints or problems other than mild pain in the left hip. No issues overnight. Positive flatus, urinating without difficulty. Denies CP, SHOB, calf pain. We will continue therapy today.   Objective: Vital signs in last 24 hours: Temp:  [98.1 F (36.7 C)-98.7 F (37.1 C)] 98.2 F (36.8 C) (01/15 0549) Pulse Rate:  [56-69] 67 (01/15 0549) Resp:  [12-16] 12 (01/15 0549) BP: (129-162)/(53-61) 133/55 (01/15 0549) SpO2:  [97 %-100 %] 97 % (01/15 0549)  Intake/Output from previous day:  Intake/Output Summary (Last 24 hours) at 01/28/2018 0754 Last data filed at 01/28/2018 0549 Gross per 24 hour  Intake 1318.76 ml  Output 2300 ml  Net -981.24 ml     Intake/Output this shift: No intake/output data recorded.  Labs: Recent Labs    01/27/18 0458 01/28/18 0320  HGB 9.6* 9.8*   Recent Labs    01/27/18 0458 01/28/18 0320  WBC 11.8* 12.7*  RBC 3.19* 3.36*  HCT 29.9* 30.8*  PLT 156 149*   Recent Labs    01/27/18 0458 01/28/18 0320  NA 140 137  K 4.5 4.4  CL 106 104  CO2 25 24  BUN 18 21  CREATININE 0.92 0.78  GLUCOSE 137* 125*  CALCIUM 8.2* 8.3*   No results for input(s): LABPT, INR in the last 72 hours.  Exam: General - Patient is Alert and Oriented Extremity - Neurologically intact Sensation intact distally Intact pulses distally Dorsiflexion/Plantar flexion intact Dressing - dressing C/D/I Motor Function - intact, moving foot and toes well on exam.   Past Medical History:  Diagnosis Date  . Adenomatous polyp    history  . Arthritis    knees , shoulders, hands   . Atrial bigeminy    in the past  . BPH (benign prostatic hyperplasia)   . Carotid artery disease ([place])    a. Doppler, January, 2012, 40-59% bilateral stenoses;  b.  Carotids 4/14:   40-59% bilat ICA, occluded R vertebral artery; f/u 1 year  . Colon polyps   . Coronary artery disease    acute inferior MI Jan 2002 with stent / Taxus stent LAD 2004 / Cypher stent prox LAD and cypher stent mid circ May 2006 / nuclear stress Dec 2009 normal EF 63% / nuclear August 2011 EF 62% / inferior thining and very mild ischemia inferior and apex / catherization Sept 2011 EF 60%  mild to moderate diffuse RCA disease, moderate discrete LAD stenosis before the stent in the proximal LAD med tx rec  . Diverticulosis   . Dyslipidemia   . Ejection fraction   . Fatty liver    history  . GERD (gastroesophageal reflux disease)   . Hypertension    [facility] - 5 stents total , last cardiac cath. 2011.  Cleared for  surgery by Dr. [name]- 04/23/2013  . IBS (irritable bowel syndrome)   . Myocardial infarction ([place]) 2002  . Numbness and tingling in hands    resolved  . Overweight(278.02)   . Prostate cancer ([place]) approx 2016   treated /w  Lupron injection x1, then radium  . Shortness of breath dyspnea   . Type II diabetes mellitus ([facility])     Assessment/Plan: 2 Days Post-Op Procedure(s) (LRB): TOTAL HIP ARTHROPLASTY ANTERIOR APPROACH (Left) Principal Problem:   OA (osteoarthritis) of hip  Estimated body mass index is 33.09 kg/m as calculated from the following:   Height as of this encounter: 5\' 5"  (1.651 m).   Weight as of this encounter: 90.2 kg. Advance diet Up with therapy D/C IV fluids  DVT Prophylaxis - Aspirin and Plavix Weight bearing as tolerated. D/C O2 and pulse ox and try on room air. Hemovac pulled without difficulty, will begin therapy.  Plan is to go Home after hospital stay with HEP. Plan for discharge today after one session of therapy as long as he continues to meet goals. Follow up with Dr. Wynelle Link in clinic in two weeks.   Griffith Citron, PA-C Orthopedic Surgery 01/28/2018, 7:54 AM

## 2018-01-28 NOTE — Plan of Care (Signed)

## 2018-01-28 NOTE — Progress Notes (Signed)
Physical Therapy Treatment Patient Details Name: [name] Ray MRN: [id number] DOB: 1933-06-01 Today's Date: 01/28/2018    History of Present Illness L DA-THA, h/o spinal surgery August 2019 with residual numbness R medial foot    PT Comments    Pt has met PT goals and is ready to DC home from PT standpoint. He ambulated 180' with RW, no loss of balance. He demonstrates good understanding of HEP.    Follow Up Recommendations  Follow surgeon's recommendation for DC plan and follow-up therapies     Equipment Recommendations  None recommended by PT    Recommendations for Other Services       Precautions / Restrictions Precautions Precautions: Fall Precaution Comments: reports several episodes of tripping but no falls in past 1 year Restrictions Weight Bearing Restrictions: No LLE Weight Bearing: Weight bearing as tolerated    Mobility  Bed Mobility Overal bed mobility: Needs Assistance Bed Mobility: Supine to Sit     Supine to sit: Min assist     General bed mobility comments: pt used gait belt strap to support LLE, min A to raise trunk  Transfers Overall transfer level: Needs assistance Equipment used: Rolling walker (2 wheeled) Transfers: Sit to/from Stand Sit to Stand: Supervision         General transfer comment:  VCs hand placement  Ambulation/Gait Ambulation/Gait assistance: Min guard Gait Distance (Feet): 180 Feet Assistive device: Rolling walker (2 wheeled) Gait Pattern/deviations: Step-to pattern;Decreased step length - left;Decreased step length - right;Step-through pattern Gait velocity: WFL   General Gait Details:  no loss of balance   Stairs             Wheelchair Mobility    Modified Rankin (Stroke Patients Only)       Balance Overall balance assessment: Modified Independent                                          Cognition Arousal/Alertness: Awake/alert Behavior During Therapy: WFL for tasks  assessed/performed Overall Cognitive Status: Within Functional Limits for tasks assessed                                        Exercises Total Joint Exercises Ankle Circles/Pumps: AROM;Both;10 reps Quad Sets: AROM;Both;10 reps;Supine Short Arc Quad: AROM;Left;Supine;10 reps Heel Slides: AAROM;Left;10 reps;Supine Hip ABduction/ADduction: AAROM;Left;10 reps;Supine    General Comments        Pertinent Vitals/Pain Pain Score: 5  Pain Location: L hip Pain Descriptors / Indicators: Sore Pain Intervention(s): Limited activity within patient's tolerance;Monitored during session;Premedicated before session;Ice applied    Home Living                      Prior Function            PT Goals (current goals can now be found in the care plan section) Acute Rehab PT Goals Patient Stated Goal: walk farther without cane, travel PT Goal Formulation: With patient Time For Goal Achievement: 02/02/18 Potential to Achieve Goals: Good Progress towards PT goals: Goals met/education completed, patient discharged from PT    Frequency    7X/week      PT Plan Current plan remains appropriate    Co-evaluation              AM-PAC PT "  6 Clicks" Mobility   Outcome Measure  Help needed turning from your back to your side while in a flat bed without using bedrails?: None Help needed moving from lying on your back to sitting on the side of a flat bed without using bedrails?: A Little Help needed moving to and from a bed to a chair (including a wheelchair)?: None Help needed standing up from a chair using your arms (e.g., wheelchair or bedside chair)?: None Help needed to walk in hospital room?: None Help needed climbing 3-5 steps with a railing? : A Little 6 Click Score: 22    End of Session Equipment Utilized During Treatment: Gait belt Activity Tolerance: Patient tolerated treatment well Patient left: in chair;with chair alarm set;with call bell/phone  within reach Nurse Communication: Mobility status PT Visit Diagnosis: Muscle weakness (generalized) (M62.81);Difficulty in walking, not elsewhere classified (R26.2);Pain Pain - Right/Left: Left Pain - part of body: Hip     Time: 3912-2583 PT Time Calculation (min) (ACUTE ONLY): 25 min  Charges:  $Gait Training: 8-22 mins $Therapeutic Exercise: 8-22 mins                    Blondell Reveal Kistler PT 01/28/2018  Acute Rehabilitation Services Pager [phone] Office [phone]

## 2018-01-28 NOTE — Progress Notes (Signed)
Patient left via wheelchair by NT with spouse. Discharge education provided and all questions answered. All belongings with patient. Vital signs stable, pain controlled.

## 2018-01-29 NOTE — Discharge Summary (Signed)
Physician Discharge Summary   Patient ID: [name] Ray MRN: [id number] DOB/AGE: [id number] 83 y.o.  Admit date: 01/26/2018 Discharge date: 01/28/2018  Primary Diagnosis: Osteoarthritis of the left hip  Admission Diagnoses:  Past Medical History:  Diagnosis Date  . Adenomatous polyp    history  . Arthritis    knees , shoulders, hands   . Atrial bigeminy    in the past  . BPH (benign prostatic hyperplasia)   . Carotid artery disease ([place])    a. Doppler, January, 2012, 40-59% bilateral stenoses;  b.  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 Colon polyps   . Coronary artery disease    acute inferior MI Jan 2002 with stent / Taxus stent LAD 2004 / Cypher stent prox LAD and cypher stent mid circ May 2006 / nuclear stress Dec 2009 normal EF 63% / nuclear August 2011 EF 62% / inferior thining and very mild ischemia inferior and apex / catherization Sept 2011 EF 60%  mild to moderate diffuse RCA disease, moderate discrete LAD stenosis before the stent in the proximal LAD med tx rec  . Diverticulosis   . Dyslipidemia   . Ejection fraction   . Fatty liver    history  . GERD (gastroesophageal reflux disease)   . Hypertension    [facility] - 5 stents total , last cardiac cath. 2011.  Cleared for  surgery by Dr. [name]- 04/23/2013  . IBS (irritable bowel syndrome)   . Myocardial infarction ([place]) 2002  . Numbness and tingling in hands    resolved  . Overweight(278.02)   . Prostate cancer ([place]) approx 2016   treated /w  Lupron injection x1, then radium  . Shortness of breath dyspnea   . Type II diabetes mellitus ([place])    Discharge Diagnoses:   Principal Problem:   OA (osteoarthritis) of hip  Estimated body mass index is 33.09 kg/m as calculated from the following:   Height as of this encounter: 5\' 5"  (1.651 m).   Weight as of this encounter: 90.2 kg.  Procedure:  Procedure(s) (LRB): TOTAL HIP ARTHROPLASTY ANTERIOR APPROACH (Left)    Consults: None  HPI: [name] Ray is a 85 y.o. male who has advanced end-  stage arthritis of their Left  hip with progressively worsening pain and  dysfunction.The patient has failed nonoperative management and presents for  total hip arthroplasty.   Laboratory Data: Admission on 01/26/2018, Discharged on 01/28/2018  Component Date Value Ref Range Status  . Glucose-Capillary 01/26/2018 93  70 - 99 mg/dL Final  . Glucose-Capillary 01/26/2018 117* 70 - 99 mg/dL Final  . Glucose-Capillary 01/26/2018 133* 70 - 99 mg/dL Final  . Glucose-Capillary 01/26/2018 227* 70 - 99 mg/dL Final  . WBC 01/27/2018 11.8* 4.0 - 10.5 K/uL Final  . RBC 01/27/2018 3.19* 4.22 - 5.81 MIL/uL Final  . Hemoglobin 01/27/2018 9.6* 13.0 - 17.0 g/dL Final  . HCT 01/27/2018 29.9* 39.0 - 52.0 % Final  . MCV 01/27/2018 93.7  80.0 - 100.0 fL Final  . MCH 01/27/2018 30.1  26.0 - 34.0 pg Final  . MCHC 01/27/2018 32.1  30.0 - 36.0 g/dL Final  . RDW 01/27/2018 13.4  11.5 - 15.5 % Final  . Platelets 01/27/2018 156  150 - 400 K/uL Final  . nRBC 01/27/2018 0.0  0.0 - 0.2 % Final   Performed at [facility], [place] [address], [place], [place] [address]  . Sodium 01/27/2018 140  135 - 145 mmol/L Final  . Potassium 01/27/2018 4.5  3.5 - 5.1 mmol/L Final  . Chloride 01/27/2018 106  98 - 111 mmol/L Final  . CO2 01/27/2018 25  22 - 32 mmol/L Final  . Glucose, Bld 01/27/2018 137* 70 - 99 mg/dL Final  . BUN 01/27/2018 18  8 - 23 mg/dL Final  . Creatinine, Ser 01/27/2018 0.92  0.61 - 1.24 mg/dL Final  . Calcium 01/27/2018 8.2* 8.9 - 10.3 mg/dL Final  . GFR calc non Af Amer 01/27/2018 >60  >60 mL/min Final  . GFR calc Af Amer 01/27/2018 >60  >60 mL/min Final  . Anion gap 01/27/2018 9  5 - 15 Final   Performed at [facility], [place] [address] [place], [place] [address]  . Glucose-Capillary 01/26/2018 117* 70 - 99 mg/dL Final  . Glucose-Capillary 01/27/2018 107* 70 - 99 mg/dL Final  .  Glucose-Capillary 01/27/2018 136* 70 - 99 mg/dL Final  . Glucose-Capillary 01/27/2018 154* 70 - 99 mg/dL Final  . WBC 01/28/2018 12.7* 4.0 - 10.5 K/uL Final  . RBC 01/28/2018 3.36* 4.22 - 5.81 MIL/uL Final  . Hemoglobin 01/28/2018 9.8* 13.0 - 17.0 g/dL Final  . HCT 01/28/2018 30.8* 39.0 - 52.0 % Final  . MCV 01/28/2018 91.7  80.0 - 100.0 fL Final  . MCH 01/28/2018 29.2  26.0 - 34.0 pg Final  . MCHC 01/28/2018 31.8  30.0 - 36.0 g/dL Final  . RDW 01/28/2018 13.6  11.5 - 15.5 % Final  . Platelets 01/28/2018 149* 150 - 400 K/uL Final  . nRBC 01/28/2018 0.0  0.0 - 0.2 % Final   Performed at [facility], [place] [address], [place], [place] [address]  . Sodium 01/28/2018 137  135 - 145 mmol/L Final  . Potassium 01/28/2018 4.4  3.5 - 5.1 mmol/L Final  . Chloride 01/28/2018 104  98 - 111 mmol/L Final  . CO2 01/28/2018 24  22 - 32 mmol/L Final  . Glucose, Bld 01/28/2018 125* 70 - 99 mg/dL Final  . BUN 01/28/2018 21  8 - 23 mg/dL Final  . Creatinine, Ser 01/28/2018 0.78  0.61 - 1.24 mg/dL Final  . Calcium 01/28/2018 8.3* 8.9 - 10.3 mg/dL Final  . GFR calc non Af Amer 01/28/2018 >60  >60 mL/min Final  . GFR calc Af Amer 01/28/2018 >60  >60 mL/min Final  . Anion gap 01/28/2018 9  5 - 15 Final   Performed at [facility], [place] [address], [place], [place] [address]  . Glucose-Capillary 01/27/2018 144* 70 - 99 mg/dL Final  . Glucose-Capillary 01/28/2018 108* 70 - 99 mg/dL Final  . Glucose-Capillary 01/28/2018 103* 70 - 99 mg/dL Final  Hospital Outpatient Visit on 01/21/2018  Component Date Value Ref Range Status  . Glucose-Capillary 01/21/2018 86  70 - 99 mg/dL Final  . Hgb A1c MFr Bld 01/21/2018 5.7* 4.8 - 5.6 % Final   Comment: (NOTE) Pre diabetes:          5.7%-6.4% Diabetes:              >6.4% Glycemic control for   <7.0% adults with diabetes   . Mean Plasma Glucose 01/21/2018 116.89  mg/dL Final   Performed at [place] [address],  [place], [place] [address]  . aPTT 01/21/2018 26  24 - 36 seconds Final   Performed at [facility], [place] [address] [place], [place] [address]  . WBC 01/21/2018 5.3  4.0 -  10.5 K/uL Final  . RBC 01/21/2018 3.93* 4.22 - 5.81 MIL/uL Final  . Hemoglobin 01/21/2018 11.3* 13.0 - 17.0 g/dL Final  . HCT 01/21/2018 36.9* 39.0 - 52.0 % Final  . MCV 01/21/2018 93.9  80.0 - 100.0 fL Final  . MCH 01/21/2018 28.8  26.0 - 34.0 pg Final  . MCHC 01/21/2018 30.6  30.0 - 36.0 g/dL Final  . RDW 01/21/2018 13.6  11.5 - 15.5 % Final  . Platelets 01/21/2018 177  150 - 400 K/uL Final  . nRBC 01/21/2018 0.0  0.0 - 0.2 % Final   Performed at [facility], [place] [address], [place], [place] [address]  . Sodium 01/21/2018 141  135 - 145 mmol/L Final  . Potassium 01/21/2018 4.8  3.5 - 5.1 mmol/L Final  . Chloride 01/21/2018 106  98 - 111 mmol/L Final  . CO2 01/21/2018 28  22 - 32 mmol/L Final  . Glucose, Bld 01/21/2018 123* 70 - 99 mg/dL Final  . BUN 01/21/2018 20  8 - 23 mg/dL Final  . Creatinine, Ser 01/21/2018 1.02  0.61 - 1.24 mg/dL Final  . Calcium 01/21/2018 9.2  8.9 - 10.3 mg/dL Final  . Total Protein 01/21/2018 6.9  6.5 - 8.1 g/dL Final  . Albumin 01/21/2018 4.3  3.5 - 5.0 g/dL Final  . AST 01/21/2018 16  15 - 41 U/L Final  . ALT 01/21/2018 10  0 - 44 U/L Final  . Alkaline Phosphatase 01/21/2018 53  38 - 126 U/L Final  . Total Bilirubin 01/21/2018 0.8  0.3 - 1.2 mg/dL Final  . GFR calc non Af Amer 01/21/2018 >60  >60 mL/min Final  . GFR calc Af Amer 01/21/2018 >60  >60 mL/min Final  . Anion gap 01/21/2018 7  5 - 15 Final   Performed at [facility], [place] [address], [place], [place] [address]  . Prothrombin Time 01/21/2018 13.1  11.4 - 15.2 seconds Final  . INR 01/21/2018 1.00   Final   Performed at [facility], [place] [address], [place], [place] [address]  . ABO/RH(D) 01/21/2018 A POS   Final  . Antibody Screen 01/21/2018 NEG   Final   . Sample Expiration 01/21/2018 01/29/2018   Final  . Extend sample reason 01/21/2018    Final                   Value:NO TRANSFUSIONS OR PREGNANCY IN THE PAST 3 MONTHS Performed at [facility], [place] [address], [place], [place] [address]   . MRSA, PCR 01/21/2018 NEGATIVE  NEGATIVE Final  . Staphylococcus aureus 01/21/2018 NEGATIVE  NEGATIVE Final   Comment: (NOTE) The Xpert SA Assay (FDA approved for NASAL specimens in patients 68 years of age and older), is one component of a comprehensive surveillance program. It is not intended to diagnose infection nor to guide or monitor treatment. Performed at [facility], [place] [address], [place], [place] [address]   . ABO/RH(D) 01/21/2018    Final                   Value:A POS Performed at [facility], [place] [address], [place], [place] [address]      X-Rays:Dg Pelvis Portable  Result Date: 01/26/2018 CLINICAL DATA:  85 year old male post left hip replacement. Subsequent encounter. EXAM: PORTABLE PELVIS 1-2 VIEWS COMPARISON:  Intraoperative C-arm views.  10/21/2017 MR. FINDINGS: Post total left hip replacement which appears in satisfactory position on  this frontal projection. Drain is in place. Vascular calcifications. IMPRESSION: Post total left hip replacement. Electronically Signed   By: Genia Del M.D.   On: 01/26/2018 10:25   Dg C-arm 1-60 Min-no Report  Result Date: 01/26/2018 Fluoroscopy was utilized by the requesting physician.  No radiographic interpretation.   Dg Hip Operative Unilat W Or W/o Pelvis Left  Result Date: 01/26/2018 CLINICAL DATA:  Left hip replacement. EXAM: OPERATIVE left HIP (WITH PELVIS IF PERFORMED) 2 VIEWS TECHNIQUE: Fluoroscopic spot image(s) were submitted for interpretation post-operatively. Radiation exposure index: 1.1269 mGy. COMPARISON:  Radiographs of August 15, 2016. FINDINGS: Two intraoperative fluoroscopic images were obtained of the left hip.  The femoral and acetabular components appear to be well situated. Surgical drain in other expected postoperative changes are seen in the surrounding soft tissues. IMPRESSION: Status post left total hip arthroplasty. Electronically Signed   By: Marijo Conception, M.D.   On: 01/26/2018 09:54    EKG: Orders placed or performed in visit on 07/07/17  . EKG 12-Lead     Hospital Course: [name] Ray is a 85 y.o. who was admitted to [facility]. They were brought to the operating room on 01/26/2018 and underwent Procedure(s): [place].  Patient tolerated the procedure well and was later transferred to the recovery room and then to the orthopaedic floor for postoperative care. They were given PO and IV analgesics for pain control following their surgery. They were given 24 hours of postoperative antibiotics of  Anti-infectives (From admission, onward)   Start     Dose/Rate Route Frequency Ordered Stop   01/26/18 1430  ceFAZolin (ANCEF) IVPB 2g/100 mL premix     2 g 200 mL/hr over 30 Minutes Intravenous Every 6 hours 01/26/18 1127 01/26/18 2309   01/26/18 0630  ceFAZolin (ANCEF) IVPB 2g/100 mL premix     2 g 200 mL/hr over 30 Minutes Intravenous On call to O.R. 01/26/18 1308 01/26/18 0835     and started on DVT prophylaxis in the form of Aspirin and Plavix.   PT and OT were ordered for total joint protocol. Discharge planning consulted to help with postop disposition and equipment needs. Patient had a good night on the evening of surgery. They started to get up OOB with therapy on POD #0. Continued working with therapy on POD #1. Hemovac drain was pulled without difficulty on day one. Continued to work with therapy into POD #2. Pt was seen during rounds on day two and was ready to go home pending progress with therapy. Dressing was changed and the incision was . Pt worked with therapy for one additional session and was meeting their goals. He was discharged to home  later that day in stable condition.  Diet: Cardiac diet Activity: WBAT Follow-up: in 2 weeks Disposition: Home Discharged Condition: good   Discharge Instructions    Call MD / Call 911   Complete by:  As directed    If you experience chest pain or shortness of breath, CALL 911 and be transported to the hospital emergency room.  If you develope a fever above 101 F, pus (white drainage) or increased drainage or redness at the wound, or calf pain, call your surgeon's office.   Change dressing   Complete by:  As directed    Change the dressing daily with sterile 4 x 4 inch gauze dressing and paper tape.   Constipation Prevention   Complete by:  As directed    Drink plenty of  fluids.  Prune juice may be helpful.  You may use a stool softener, such as Colace (over the counter) 100 mg twice a day.  Use MiraLax (over the counter) for constipation as needed.   Diet - low sodium heart healthy   Complete by:  As directed    Discharge instructions   Complete by:  As directed    Dr. [name] Total Joint Specialist [facility] [address], [place], [place] [address] [phone]  ANTERIOR APPROACH TOTAL HIP REPLACEMENT POSTOPERATIVE DIRECTIONS   Hip Rehabilitation, Guidelines Following Surgery  The results of a hip operation are greatly improved after range of motion and muscle strengthening exercises. Follow all safety measures which are given to protect your hip. If any of these exercises cause increased pain or swelling in your joint, decrease the amount until you are comfortable again. Then slowly increase the exercises. Call your caregiver if you have problems or questions.   HOME CARE INSTRUCTIONS  Remove items at home which could result in a fall. This includes throw rugs or furniture in walking pathways.  ICE to the affected hip every three hours for 30 minutes at a time and then as needed for pain and swelling.  Continue to use ice on the hip for pain and swelling  from surgery. You may notice swelling that will progress down to the foot and ankle.  This is normal after surgery.  Elevate the leg when you are not up walking on it.   Continue to use the breathing machine which will help keep your temperature down.  It is common for your temperature to cycle up and down following surgery, especially at night when you are not up moving around and exerting yourself.  The breathing machine keeps your lungs expanded and your temperature down.  DIET You may resume your previous home diet once your are discharged from the hospital.  DRESSING / WOUND CARE / SHOWERING You may shower 3 days after surgery, but keep the wounds dry during showering.  You may use an occlusive plastic wrap (Press'n Seal for example), NO SOAKING/SUBMERGING IN THE BATHTUB.  If the bandage gets wet, change with a clean dry gauze.  If the incision gets wet, pat the wound dry with a clean towel. You may start showering once you are discharged home but do not submerge the incision under water. Just pat the incision dry and apply a dry gauze dressing on daily. Change the surgical dressing daily and reapply a dry dressing each time.  ACTIVITY Walk with your walker as instructed. Use walker as long as suggested by your caregivers. Avoid periods of inactivity such as sitting longer than an hour when not asleep. This helps prevent blood clots.  You may resume a sexual relationship in one month or when given the OK by your doctor.  You may return to work once you are cleared by your doctor.  Do not drive a car for 6 weeks or until released by you surgeon.  Do not drive while taking narcotics.  WEIGHT BEARING Weight bearing as tolerated with assist device (walker, cane, etc) as directed, use it as long as suggested by your surgeon or therapist, typically at least 4-6 weeks.  POSTOPERATIVE CONSTIPATION PROTOCOL Constipation - defined medically as fewer than three stools per week and severe  constipation as less than one stool per week.  One of the most common issues patients have following surgery is constipation.  Even if you have a regular bowel pattern  at home, your normal regimen is likely to be disrupted due to multiple reasons following surgery.  Combination of anesthesia, postoperative narcotics, change in appetite and fluid intake all can affect your bowels.  In order to avoid complications following surgery, here are some recommendations in order to help you during your recovery period.  Colace (docusate) - Pick up an over-the-counter form of Colace or another stool softener and take twice a day as long as you are requiring postoperative pain medications.  Take with a full glass of water daily.  If you experience loose stools or diarrhea, hold the colace until you stool forms back up.  If your symptoms do not get better within 1 week or if they get worse, check with your doctor.  Dulcolax (bisacodyl) - Pick up over-the-counter and take as directed by the product packaging as needed to assist with the movement of your bowels.  Take with a full glass of water.  Use this product as needed if not relieved by Colace only.   MiraLax (polyethylene glycol) - Pick up over-the-counter to have on hand.  MiraLax is a solution that will increase the amount of water in your bowels to assist with bowel movements.  Take as directed and can mix with a glass of water, juice, soda, coffee, or tea.  Take if you go more than two days without a movement. Do not use MiraLax more than once per day. Call your doctor if you are still constipated or irregular after using this medication for 7 days in a row.  If you continue to have problems with postoperative constipation, please contact the office for further assistance and recommendations.  If you experience "the worst abdominal pain ever" or develop nausea or vomiting, please contact the office immediatly for further recommendations for  treatment.  ITCHING  If you experience itching with your medications, try taking only a single pain pill, or even half a pain pill at a time.  You can also use Benadryl over the counter for itching or also to help with sleep.   TED HOSE STOCKINGS Wear the elastic stockings on both legs for three weeks following surgery during the day but you may remove then at night for sleeping.  MEDICATIONS See your medication summary on the "After Visit Summary" that the nursing staff will review with you prior to discharge.  You may have some home medications which will be placed on hold until you complete the course of blood thinner medication.  It is important for you to complete the blood thinner medication as prescribed by your surgeon.  Continue your approved medications as instructed at time of discharge.  PRECAUTIONS If you experience chest pain or shortness of breath - call 911 immediately for transfer to the hospital emergency department.  If you develop a fever greater that 101 F, purulent drainage from wound, increased redness or drainage from wound, foul odor from the wound/dressing, or calf pain - CONTACT YOUR SURGEON.                                                   FOLLOW-UP APPOINTMENTS Make sure you keep all of your appointments after your operation with your surgeon and caregivers. You should call the office at the above phone number and make an appointment for approximately two weeks after the date of your surgery or  on the date instructed by your surgeon outlined in the "After Visit Summary".  RANGE OF MOTION AND STRENGTHENING EXERCISES  These exercises are designed to help you keep full movement of your hip joint. Follow your caregiver's or physical therapist's instructions. Perform all exercises about fifteen times, three times per day or as directed. Exercise both hips, even if you have had only one joint replacement. These exercises can be done on a training (exercise) mat, on the  floor, on a table or on a bed. Use whatever works the best and is most comfortable for you. Use music or television while you are exercising so that the exercises are a pleasant break in your day. This will make your life better with the exercises acting as a break in routine you can look forward to.  Lying on your back, slowly slide your foot toward your buttocks, raising your knee up off the floor. Then slowly slide your foot back down until your leg is straight again.  Lying on your back spread your legs as far apart as you can without causing discomfort.  Lying on your side, raise your upper leg and foot straight up from the floor as far as is comfortable. Slowly lower the leg and repeat.  Lying on your back, tighten up the muscle in the front of your thigh (quadriceps muscles). You can do this by keeping your leg straight and trying to raise your heel off the floor. This helps strengthen the largest muscle supporting your knee.  Lying on your back, tighten up the muscles of your buttocks both with the legs straight and with the knee bent at a comfortable angle while keeping your heel on the floor.   IF YOU ARE TRANSFERRED TO A SKILLED REHAB FACILITY If the patient is transferred to a skilled rehab facility following release from the hospital, a list of the current medications will be sent to the facility for the patient to continue.  When discharged from the skilled rehab facility, please have the facility set up the patient's [place] prior to being released. Also, the skilled facility will be responsible for providing the patient with their medications at time of release from the facility to include their pain medication, the muscle relaxants, and their blood thinner medication. If the patient is still at the rehab facility at time of the two week follow up appointment, the skilled rehab facility will also need to assist the patient in arranging follow up appointment in our office  and any transportation needs.  MAKE SURE YOU:  Understand these instructions.  Get help right away if you are not doing well or get worse.    Pick up stool softner and laxative for home use following surgery while on pain medications. Do not submerge incision under water. Please use good hand washing techniques while changing dressing each day. May shower starting three days after surgery. Please use a clean towel to pat the incision dry following showers. Continue to use ice for pain and swelling after surgery. Do not use any lotions or creams on the incision until instructed by your surgeon.   Do not sit on low chairs, stoools or toilet seats, as it may be difficult to get up from low surfaces   Complete by:  As directed    Driving restrictions   Complete by:  As directed    No driving for two weeks   TED hose   Complete by:  As directed    Use  stockings (TED hose) for three weeks on both leg(s).  You may remove them at night for sleeping.   Weight bearing as tolerated   Complete by:  As directed      Allergies as of 01/28/2018      Reactions   Atorvastatin Other (See Comments)   SEVERE LEG PAIN Can not walk   Rosuvastatin Other (See Comments)   JOINT PAIN  Makes knees hurt    Clindamycin Rash   PATIENT REFUSES      Medication List    STOP taking these medications   oxyCODONE 5 MG immediate release tablet Commonly known as:  Oxy IR/ROXICODONE     TAKE these medications   acetaminophen 650 MG CR tablet Commonly known as:  TYLENOL Take 650 mg by mouth every 8 (eight) hours as needed for pain.   amLODipine 5 MG tablet Commonly known as:  NORVASC Take 5 mg by mouth at bedtime.   amoxicillin 500 MG capsule Commonly known as:  AMOXIL Take 2,000 mg by mouth See admin instructions. Take 4 capsules (2000 mg) by mouth 1 prior to dental appointments.   aspirin 325 MG EC tablet Take 1 tablet (325 mg total) by mouth daily with breakfast for 19 doses. Take one tablet  daily for three weeks, then discontinue.   clopidogrel 75 MG tablet Commonly known as:  PLAVIX Take 75 mg by mouth daily.   docusate sodium 100 MG capsule Commonly known as:  COLACE Take 1 capsule (100 mg total) by mouth 2 (two) times daily.   guaiFENesin 600 MG 12 hr tablet Commonly known as:  MUCINEX Take 1,200 mg by mouth daily as needed for cough or to loosen phlegm.   HYDROcodone-acetaminophen 5-325 MG tablet Commonly known as:  NORCO/VICODIN Take 1-2 tablets by mouth every 6 (six) hours as needed for severe pain.   KOMBIGLYZE XR 2.05-998 MG Tb24 Generic drug:  Saxagliptin-Metformin Take 1 tablet by mouth 2 (two) times daily.   loperamide 2 MG tablet Commonly known as:  IMODIUM A-D Take 2 mg by mouth 4 (four) times daily as needed for diarrhea or loose stools.   loratadine 10 MG tablet Commonly known as:  CLARITIN Take 10 mg by mouth daily as needed for allergies.   methocarbamol 500 MG tablet Commonly known as:  ROBAXIN Take 1 tablet (500 mg total) by mouth every 6 (six) hours as needed for muscle spasms.   nitroGLYCERIN 0.4 MG SL tablet Commonly known as:  NITROSTAT Place 1 tablet (0.4 mg total) under the tongue every 5 (five) minutes as needed for chest pain. Please keep upcoming appt. Thank you   omeprazole 20 MG capsule Commonly known as:  PRILOSEC Take 20 mg by mouth daily.   ramipril 10 MG capsule Commonly known as:  ALTACE Take 10 mg by mouth daily after breakfast.   simvastatin 20 MG tablet Commonly known as:  ZOCOR Take 20 mg by mouth at bedtime.   tamsulosin 0.4 MG Caps capsule Commonly known as:  FLOMAX Take 0.4 mg by mouth at bedtime.   traMADol 50 MG tablet Commonly known as:  ULTRAM Take 1-2 tablets (50-100 mg total) by mouth every 6 (six) hours as needed for moderate pain.            Discharge Care Instructions  (From admission, onward)         Start     Ordered   01/27/18 0000  Weight bearing as tolerated     01/27/18 0729  01/27/18 0000  Change dressing    Comments:  Change the dressing daily with sterile 4 x 4 inch gauze dressing and paper tape.   01/27/18 0729         Follow-up Information    [name], MD. Schedule an appointment as soon as possible for a visit on 02/10/2018.   Specialty:  Orthopedic Surgery Contact information: [address] [place] [address] [phone]           Signed: Griffith Citron, PA-C Orthopedic Surgery 01/29/2018, 12:24 PM

## 2018-04-09 ENCOUNTER — Telehealth: Payer: Self-pay

## 2018-04-09 NOTE — Telephone Encounter (Signed)
Due to COVID-19 pandemic, the patient wishes to cancel 4/1 visit. He denies cardiac symptoms. He understands he will be called to arrange appointment once restrictions are lifted. He was grateful for assistance.

## 2018-04-15 ENCOUNTER — Ambulatory Visit: Payer: Medicare Other | Admitting: Cardiovascular Disease

## 2018-04-16 ENCOUNTER — Telehealth: Payer: Self-pay | Admitting: Cardiovascular Disease

## 2018-04-16 NOTE — Telephone Encounter (Signed)
° ° °  Virtual Visit Pre-Appointment Phone Call  Steps For Call:  1. Confirm consent - "In the setting of the current Covid19 crisis, you are scheduled for a (phone or video) visit with your provider on (date) at (time).  Just as we do with many in-office visits, in order for you to participate in this visit, we must obtain consent.  If you'd like, I can send this to your mychart (if signed up) or email for you to review.  Otherwise, I can obtain your verbal consent now.  All virtual visits are billed to your insurance company just like a normal visit would be.  By agreeing to a virtual visit, we'd like you to understand that the technology does not allow for your provider to perform an examination, and thus may limit your provider's ability to fully assess your condition.  Finally, though the technology is pretty good, we cannot assure that it will always work on either your or our end, and in the setting of a video visit, we may have to convert it to a phone-only visit.  In either situation, we cannot ensure that we have a secure connection.  Are you willing to proceed?"  2. Give patient instructions for WebEx download to smartphone as below if video visit  3. Advise patient to be prepared with any vital sign or heart rhythm information, their current medicines, and a piece of paper and pen handy for any instructions they may receive the day of their visit  4. Inform patient they will receive a phone call 15 minutes prior to their appointment time (may be from unknown caller ID) so they should be prepared to answer  5. Confirm that appointment type is correct in Epic appointment notes (video vs telephone)    TELEPHONE CALL NOTE  [name] Ray has been deemed a candidate for a follow-up tele-health visit to limit community exposure during the Covid-19 pandemic. I spoke with the patient via phone to ensure availability of phone/video source, confirm preferred email & phone number, and discuss  instructions and expectations.  I reminded [name] Ray to be prepared with any vital sign and/or heart rhythm information that could potentially be obtained via home monitoring, at the time of his visit. I reminded [name] Ray to expect a phone call at the time of his visit if his visit.  Did the patient verbally acknowledge consent to treatment? Yes  Howie Ill 04/16/2018 8:17 AM

## 2018-04-17 ENCOUNTER — Other Ambulatory Visit: Payer: Self-pay

## 2018-04-17 ENCOUNTER — Encounter: Payer: Self-pay | Admitting: Cardiovascular Disease

## 2018-04-17 ENCOUNTER — Telehealth (INDEPENDENT_AMBULATORY_CARE_PROVIDER_SITE_OTHER): Payer: Medicare Other | Admitting: Cardiovascular Disease

## 2018-04-17 VITALS — BP 119/62 | HR 66 | Ht 66.0 in | Wt 191.0 lb

## 2018-04-17 DIAGNOSIS — I2510 Atherosclerotic heart disease of native coronary artery without angina pectoris: Secondary | ICD-10-CM | POA: Diagnosis not present

## 2018-04-17 DIAGNOSIS — I10 Essential (primary) hypertension: Secondary | ICD-10-CM | POA: Diagnosis not present

## 2018-04-17 DIAGNOSIS — E782 Mixed hyperlipidemia: Secondary | ICD-10-CM | POA: Diagnosis not present

## 2018-04-17 DIAGNOSIS — I6523 Occlusion and stenosis of bilateral carotid arteries: Secondary | ICD-10-CM

## 2018-04-17 NOTE — Progress Notes (Signed)
Virtual Visit via Telephone Note    Evaluation Performed:  Follow-up visit  This visit type was conducted due to national recommendations for restrictions regarding the COVID-19 Pandemic (e.g. social distancing).  This format is felt to be most appropriate for this patient at this time.  All issues noted in this document were discussed and addressed.  No physical exam was performed (except for noted visual exam findings with Video Visits).  Please refer to the patient's chart (MyChart message for video visits and phone note for telephone visits) for the patient's consent to telehealth for [facility].  Date:  04/17/2018   ID:  [name] Ray, DOB 30-Jul-1933, MRN [id number]  Patient Location:  home  Provider location:   home  PCP:  Tisovec, Fransico Him, MD  Cardiologist:  Sherren Mocha, MD  Electrophysiologist:  None   Chief Complaint:  CAD, follow-up evaluation  History of Present Illness:    [name] Ray is a 85 y.o. male who presents via audio/video conferencing for a telehealth visit today.  Audio conferencing is performed because the patient is unable to do a video conference.  The patient does not have symptoms concerning for COVID-19 infection (fever, chills, cough, or new shortness of breath).   The patient has CAD and has undergone stenting of the RCA ( bare metal stents), LAD (DES in 2004 and 2006), and LCx (DES in 2006). Last cath in 2011 showed patency of stented segments with moderate ISR and medical therapy was recommended. Other problems include carotid stenosis without stroke, hyperlipidemia, and HTN.  He is interviewed over the telephone today. He's staying active doing regular work in his yard and has no exertional symptoms. Today, he denies symptoms of palpitations, chest pain, shortness of breath, orthopnea, PND, lower extremity edema, dizziness, or syncope.  Prior CV studies:   The following studies were reviewed today:  2D Echo 05/06/2014: Study  Conclusions  - Left ventricle: The cavity size was normal. There was mild   concentric hypertrophy. Systolic function was vigorous. The   estimated ejection fraction was in the range of 65% to 70%. Wall   motion was normal; there were no regional wall motion   abnormalities. Doppler parameters are consistent with abnormal   left ventricular relaxation (grade 1 diastolic dysfunction).   There was no evidence of elevated ventricular filling pressure by   Doppler parameters. - Aortic valve: Trileaflet; normal thickness leaflets. - Aortic root: The aortic root was normal in size. - Mitral valve: Structurally normal valve. There was no   regurgitation. - Left atrium: The atrium was normal in size. - Right ventricle: Systolic function was normal. - Right atrium: The atrium was normal in size. - Tricuspid valve: There was trivial regurgitation. - Pulmonic valve: There was no regurgitation. - Pulmonary arteries: Systolic pressure was within the normal   range. - Inferior vena cava: The vessel was normal in size. - Pericardium, extracardiac: There was no pericardial effusion.  Carotid US 2018: <40% BL ICA stenosis  Past Medical History:  Diagnosis Date  . Adenomatous polyp    history  . Arthritis    knees , shoulders, hands   . Atrial bigeminy    in the past  . BPH (benign prostatic hyperplasia)   . Carotid artery disease ([place])    a. Doppler, January, 2012, 40-59% bilateral stenoses;  b.  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 Colon polyps   . Coronary artery disease  acute inferior MI Jan 2002 with stent / Taxus stent LAD 2004 / Cypher stent prox LAD and cypher stent mid circ May 2006 / nuclear stress Dec 2009 normal EF 63% / nuclear August 2011 EF 62% / inferior thining and very mild ischemia inferior and apex / catherization Sept 2011 EF 60%  mild to moderate diffuse RCA disease, moderate discrete LAD stenosis before the stent in the  proximal LAD med tx rec  . Diverticulosis   . Dyslipidemia   . Ejection fraction   . Fatty liver    history  . GERD (gastroesophageal reflux disease)   . Hypertension    [facility] - 5 stents total , last cardiac cath. 2011.  Cleared for  surgery by Dr. [name]- 04/23/2013  . IBS (irritable bowel syndrome)   . Myocardial infarction ([place]) 2002  . Numbness and tingling in hands    resolved  . Overweight(278.02)   . Prostate cancer ([place]) approx 2016   treated /w  Lupron injection x1, then radium  . Shortness of breath dyspnea   . Type II diabetes mellitus ([place])    Past Surgical History:  Procedure Laterality Date  . CARDIAC CATHETERIZATION     last cath- 2006  . COLONOSCOPY    . CORONARY ANGIOPLASTY WITH STENT PLACEMENT     multiple stents in LAD:  "I've got 5 stents" (05/24/2013)  . EYE SURGERY Bilateral    cataract surgery with lens implants  . LUMBAR LAMINECTOMY/DECOMPRESSION MICRODISCECTOMY N/A 09/11/2017   Procedure: LAMINECTOMY AND FORAMINOTOMY LUMBAR One-Two, Two-Three, Three-Four, Four-Five, Five-Sacral One;  Surgeon: Newman Pies, MD;  Location: [facility];  Service: Neurosurgery;  Laterality: N/A;  LAMINECTOMY AND FORAMINOTOMY LUMBAR One-Two,Two-Three, Three-Four, Four-Five, Five-Sacral One  . NEUROPLASTY / TRANSPOSITION MEDIAN NERVE AT CARPAL TUNNEL Left    also surgery on thumb, elbow- nerve release   . PROSTATE BIOPSY     followed later by [facility]  . TOTAL HIP ARTHROPLASTY Left 01/26/2018   Procedure: TOTAL HIP ARTHROPLASTY ANTERIOR APPROACH;  Surgeon: [name], MD;  Location: [facility];  Service: Orthopedics;  Laterality: Left;  179min  . TOTAL KNEE ARTHROPLASTY Right 05/24/2013  . TOTAL KNEE ARTHROPLASTY Right 05/24/2013   Procedure: RIGHT TOTAL KNEE ARTHROPLASTY;  Surgeon: [name], MD;  Location: [facility];  Service: Orthopedics;  Laterality: Right;  . TOTAL KNEE ARTHROPLASTY Left 02/14/2014   Procedure: LEFT TOTAL KNEE ARTHROPLASTY;  Surgeon: [name], MD;  Location:  [facility];  Service: Orthopedics;  Laterality: Left;     Current Meds  Medication Sig  . acetaminophen (TYLENOL) 650 MG CR tablet Take 650 mg by mouth every 8 (eight) hours as needed for pain.  Marland Kitchen amLODipine (NORVASC) 5 MG tablet Take 5 mg by mouth at bedtime.   Marland Kitchen amoxicillin (AMOXIL) 500 MG capsule Take 2,000 mg by mouth See admin instructions. Take 4 capsules (2000 mg) by mouth 1 prior to dental appointments.  . clopidogrel (PLAVIX) 75 MG tablet Take 75 mg by mouth daily.   Marland Kitchen guaiFENesin (MUCINEX) 600 MG 12 hr tablet Take 1,200 mg by mouth daily as needed for cough or to loosen phlegm.  Marland Kitchen HYDROcodone-acetaminophen (NORCO/VICODIN) 5-325 MG tablet Take 1-2 tablets by mouth every 6 (six) hours as needed for severe pain.  Marland Kitchen JENTADUETO XR 2.05-998 MG TB24 Take 1 tablet by mouth 2 (two) times daily.  Marland Kitchen loperamide (IMODIUM A-D) 2 MG tablet Take 2 mg by mouth 4 (four) times daily as needed for diarrhea or loose stools.  Marland Kitchen loratadine (  CLARITIN) 10 MG tablet Take 10 mg by mouth daily as needed for allergies.   . nitroGLYCERIN (NITROSTAT) 0.4 MG SL tablet Place 1 tablet (0.4 mg total) under the tongue every 5 (five) minutes as needed for chest pain. Please keep upcoming appt. Thank you  . omeprazole (PRILOSEC) 20 MG capsule Take 20 mg by mouth daily.   . ramipril (ALTACE) 10 MG capsule Take 10 mg by mouth daily after breakfast.   . simvastatin (ZOCOR) 20 MG tablet Take 20 mg by mouth at bedtime.    . tamsulosin (FLOMAX) 0.4 MG CAPS capsule Take 0.4 mg by mouth at bedtime.   . traMADol (ULTRAM) 50 MG tablet Take 1-2 tablets (50-100 mg total) by mouth every 6 (six) hours as needed for moderate pain.  . [DISCONTINUED] KOMBIGLYZE XR 2.05-998 MG TB24 Take 1 tablet by mouth 2 (two) times daily.     Allergies:   Atorvastatin; Rosuvastatin; and Clindamycin   Social History   Tobacco Use  . Smoking status: Never Smoker  . Smokeless tobacco: Never Used  Substance Use Topics  . Alcohol use: No  . Drug use: No      Family Hx: The patient's family history includes Heart attack in his paternal grandfather; Heart disease in his brother and father; Hypertension in his father.  ROS:   Please see the history of present illness.    All other systems reviewed and are negative.  Labs/Other Tests and Data Reviewed:    Recent Labs: 01/21/2018: ALT 10 01/28/2018: BUN 21; Creatinine, Ser 0.78; Hemoglobin 9.8; Platelets 149; Potassium 4.4; Sodium 137   Recent Lipid Panel No results found for: CHOL, TRIG, HDL, CHOLHDL, LDLCALC, LDLDIRECT  Wt Readings from Last 3 Encounters:  04/17/18 191 lb (86.6 kg)  01/27/18 198 lb 13.7 oz (90.2 kg)  09/03/17 191 lb 6.4 oz (86.8 kg)     Objective:    Vital Signs:  BP 119/62 (BP Location: Left Arm, Patient Position: Sitting, Cuff Size: Normal)   Pulse 66   Ht 5\' 6"  (1.676 m)   Wt 191 lb (86.6 kg)   BMI 30.83 kg/m    Exam not performed as this is a telephone interview. The patient is breathing comfortably during conversation and is in NAD.  ASSESSMENT & PLAN:    1.  CAD, native vessel, without angina: doing well on clopidogrel alone. Bruising improved since discontinuation of ASA last year. Continue same Rx.   2. HTN: Blood pressure well controlled on current medications.  Most recent laboratory data reviewed.  Renal function and electrolytes in January 2020 are within normal limits.  3.  Mixed hyperlipidemia: The patient is treated with a statin drug.  He is followed by his primary care physician.  Liver function test from January 2020 are normal.  Overall the patient appears to be doing well from a cardiovascular perspective.  He will contact me if any problems, otherwise I will plan to see him back in 1 year for follow-up evaluation.  COVID-19 Education: The signs and symptoms of COVID-19 were discussed with the patient and how to seek care for testing (follow up with PCP or arrange E-visit). The importance of social distancing was discussed today.   Patient Risk:   After full review of this patient's clinical status, I feel that they are at least moderate risk at this time.  Time:   Today, I have spent 16 minutes with the patient with telehealth technology discussing issues above.     Medication Adjustments/Labs and Tests  Ordered: Current medicines are reviewed at length with the patient today.  Concerns regarding medicines are outlined above.  Tests Ordered: No orders of the defined types were placed in this encounter.  Medication Changes: No orders of the defined types were placed in this encounter.   Disposition:  Follow up in 1 year(s)  Signed, Sherren Mocha, MD  04/17/2018 8:59 AM    [place]

## 2018-07-01 ENCOUNTER — Encounter: Payer: Self-pay | Admitting: Cardiovascular Disease

## 2018-07-01 NOTE — Telephone Encounter (Signed)
error 

## 2018-07-13 ENCOUNTER — Telehealth: Payer: Self-pay | Admitting: Physician Assistant

## 2018-07-13 NOTE — Progress Notes (Signed)
Cardiology Office Note:    Date:  07/14/2018   ID:  [name] Ray, DOB 04/26/1933, MRN [id number]  PCP:  Haywood Pao, MD  Cardiologist:  Sherren Mocha, MD   Electrophysiologist:  None   Referring MD: Haywood Pao, MD   Chief Complaint  Patient presents with  . Dizziness  . Chest Pain     History of Present Illness:    [name] Ray is a 85 y.o. male with  Coronary artery disease   S/p Inf MI in 2002 tx with PCI  S/p Taxus DES to LAD in 2004  S/p Cypher DES to pLAD and Cypher DES to [facility] in 05/2004  LHC in 2011 - stents patent  Diabetes Mellitus  Hypertension   Hyperlipidemia  Carotid artery disease  BPH  Prostate CA s/p radiation   [name] Ray was last seen by Dr. Burt Knack via Telemedicine 04/17/2018.  He saw his urologist recently and noted dizziness.  His Flomax was changed to every other day.  He is here alone today.  He has had occasional chest pains over the years since his initial PCI.  Recently, they seem to be more frequent.  He started on omeprazole without significant improvement.  They are not really related to exertion.  He does have dyspnea with exertion.  This is chronic without change.  He has not had orthopnea, paroxysmal nocturnal dyspnea, edema.  He has not had syncope.  But he does have a lot of dizziness.  He feels lightheaded all of the time.  He talked to his primary care doctor and his amlodipine was stopped.  This has not really made much of a difference in his dizziness.  He does note that his blood sugar is in the 80s sometimes when his dizziness is worse.  He does have some orthostatic intolerance.  He feels that this has improved somewhat since cutting back on his Flomax.  He sometimes feels like he can hear his heartbeat in his ears.  He also has an occasional pain that radiates up the left side of his head.  Prior CV studies:   The following studies were reviewed today:  Carotid US 05/14/16 Bilat ICA 1-39  Echo 05/06/14 Mild  conc LVH, EF 65-70, no RWMA, Gr 1 DD, trivial TR  Cardiac Catheterization 9/11:  EF 60% RCA proximal stent patent with 30-40% ISR, mid 30-40%, 40% prior to bifurcation into PLV and PDA RI ostial 30% LCx 30%, mid to distal stents patent with minimal ISR LAD proximal stent patent with mild ISR, proximal LAD before the stent 50-60% (not flow-limiting).  Medical therapy was continued.  Past Medical History:  Diagnosis Date  . Adenomatous polyp    history  . Arthritis    knees , shoulders, hands   . Atrial bigeminy    in the past  . BPH (benign prostatic hyperplasia)   . Carotid artery disease ([place])    a. Doppler, January, 2012, 40-59% bilateral stenoses;  b.  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 Colon polyps   . Coronary artery disease    acute inferior MI Jan 2002 with stent / Taxus stent LAD 2004 / Cypher stent prox LAD and cypher stent mid circ May 2006 / nuclear stress Dec 2009 normal EF 63% / nuclear August 2011 EF 62% / inferior thining and very mild ischemia inferior and apex / catherization Sept 2011 EF 60%  mild to moderate diffuse RCA disease, moderate discrete  LAD stenosis before the stent in the proximal LAD med tx rec  . Diverticulosis   . Dyslipidemia   . Ejection fraction   . Fatty liver    history  . GERD (gastroesophageal reflux disease)   . Hypertension    [facility] - 5 stents total , last cardiac cath. 2011.  Cleared for  surgery by Dr. [name]- 04/23/2013  . IBS (irritable bowel syndrome)   . Myocardial infarction ([place]) 2002  . Numbness and tingling in hands    resolved  . Overweight(278.02)   . Prostate cancer ([place]) approx 2016   treated /w  Lupron injection x1, then radium  . Shortness of breath dyspnea   . Type II diabetes mellitus ([place])    Surgical Hx: The patient  has a past surgical history that includes Neuroplasty / transposition median nerve at carpal tunnel (Left); Prostate biopsy; Total knee arthroplasty  (Right, 05/24/2013); Total knee arthroplasty (Right, 05/24/2013); Cardiac catheterization; Coronary angioplasty with stent; Total knee arthroplasty (Left, 02/14/2014); Colonoscopy; Eye surgery (Bilateral); Lumbar laminectomy/decompression microdiscectomy (N/A, 09/11/2017); and Total hip arthroplasty (Left, 01/26/2018).   Current Medications: Current Meds  Medication Sig  . acetaminophen (TYLENOL) 650 MG CR tablet Take 650 mg by mouth every 8 (eight) hours as needed for pain.  Marland Kitchen amoxicillin (AMOXIL) 500 MG capsule Take 2,000 mg by mouth See admin instructions. Take 4 capsules (2000 mg) by mouth 1 prior to dental appointments.  . clopidogrel (PLAVIX) 75 MG tablet Take 75 mg by mouth daily.   . Guaifenesin (MUCINEX MAXIMUM STRENGTH) 1200 MG TB12 Take by mouth daily.  . JENTADUETO XR 2.05-998 MG TB24 Take 1 tablet by mouth 2 (two) times daily.  Marland Kitchen loperamide (IMODIUM A-D) 2 MG tablet Take 2 mg by mouth 4 (four) times daily as needed for diarrhea or loose stools.  Marland Kitchen loratadine (CLARITIN) 10 MG tablet Take 10 mg by mouth daily as needed for allergies.   . nitroGLYCERIN (NITROSTAT) 0.4 MG SL tablet Place 1 tablet (0.4 mg total) under the tongue every 5 (five) minutes as needed for chest pain. Please keep upcoming appt. Thank you  . omeprazole (PRILOSEC) 20 MG capsule Take 20 mg by mouth daily.   . ramipril (ALTACE) 10 MG capsule Take 10 mg by mouth daily after breakfast.   . simvastatin (ZOCOR) 20 MG tablet Take 20 mg by mouth at bedtime.    . tamsulosin (FLOMAX) 0.4 MG CAPS capsule Take 0.4 mg by mouth at bedtime.   . TRAMADOL HCL PO Take by mouth as directed.     Allergies:   Atorvastatin, Rosuvastatin, and Clindamycin   Social History   Tobacco Use  . Smoking status: Never Smoker  . Smokeless tobacco: Never Used  Substance Use Topics  . Alcohol use: No  . Drug use: No     Family Hx: The patient's family history includes Heart attack in his paternal grandfather; Heart disease in his brother and  father; Hypertension in his father.  ROS:   Please see the history of present illness.    ROS All other systems reviewed and are negative.   EKGs/Labs/Other Test Reviewed:    EKG:  EKG is  ordered today.  The ekg ordered today demonstrates normal sinus rhythm, heart rate 65, normal axis, nonspecific ST-T wave changes, QTC 405, frequent unifocal PVCs  Recent Labs: 01/21/2018: ALT 10 07/14/2018: BUN 25; Creatinine, Ser 1.14; Hemoglobin 11.1; Magnesium 1.7; Platelets 185; Potassium 5.6; Sodium 141   Recent Lipid Panel No results found for:  CHOL, TRIG, HDL, CHOLHDL, LDLCALC, LDLDIRECT     Physical Exam:    VS:  BP 118/60   Pulse 72   Ht 5\' 6"  (1.676 m)   Wt 193 lb 12.8 oz (87.9 kg)   BMI 31.28 kg/m     Wt Readings from Last 3 Encounters:  07/14/18 193 lb 12.8 oz (87.9 kg)  04/17/18 191 lb (86.6 kg)  01/27/18 198 lb 13.7 oz (90.2 kg)     Physical Exam  Constitutional: He is oriented to person, place, and time. He appears well-developed and well-nourished. No distress.  HENT:  Head: Normocephalic and atraumatic.  Eyes: No scleral icterus.  Neck: No JVD present. Carotid bruit is not present. No thyromegaly present.  Cardiovascular: Normal rate and normal heart sounds. An irregular rhythm present.  No murmur heard. Pulmonary/Chest: Effort normal and breath sounds normal. He has no rales.  Abdominal: Soft. There is no hepatomegaly.  Musculoskeletal:        General: No edema.  Lymphadenopathy:    He has no cervical adenopathy.  Neurological: He is alert and oriented to person, place, and time.  Skin: Skin is warm and dry.  Psychiatric: He has a normal mood and affect.    ASSESSMENT & PLAN:    1. Dizziness Etiology of his dizziness is not entirely clear.  He has multiple etiologies.  Question of his low blood sugars may be contributing somewhat.  I have asked him to follow-up with primary care if his diabetic medications could be adjusted.  He does have some orthostatic  intolerance and this may be exacerbated by Flomax.  I have asked him to follow-up with his urologist to see if his Flomax can be discontinued.  I doubt that worsening carotid artery disease would contribute to his symptoms.  However, he does hear his heartbeat in his ears.  He has not had carotid ultrasound in 2 years.  His hemoglobin was question of worsening anemia may be contributing.  Have a lot of PVCs on his EKG today.  -Labs today: BMET, magnesium, CBC  -Zio 3-14-day monitor  -Arrange follow-up carotid US  -Wear compression stockings daily  -Follow-up with PCP and urologist as noted  2. PVC's (premature ventricular contractions) As noted, he has frequent PVCs on his ECG today.  Obtain long-term 3-14-day monitor to rule out significant arrhythmias and assess PVC burden.  3. Coronary artery disease involving native coronary artery of native heart without angina pectoris 4. Other chest pain History of inferior MI in 2002 treated with PCI.  Since then, he has had a Taxus drug-eluting stent to the LAD in 2004 and Cypher drug-eluting stent to the LAD and LCx in 2006.  Stents were patent by cardiac catheterization 2011.  He has had.  These are somewhat atypical for ischemia.  As it has been many years since his last assessment for ischemia, I have recommended proceeding with a Lexiscan Myoview.  If his EF is not obtained on his Myoview, he will need an echocardiogram given the frequent PVCs noted on his ECG.  5. Bilateral carotid artery stenosis Obtain follow up carotid US as noted.   6. Essential hypertension The patient's blood pressure is controlled on his current regimen.  Continue current therapy.     Dispo:  Return in about 3 weeks (around 08/04/2018) for Follow up after testing, w/ Dr. Burt Knack, or Richardson Dopp, PA-C.   Medication Adjustments/Labs and Tests Ordered: Current medicines are reviewed at length with the patient today.  Concerns regarding medicines  are outlined above.  Tests  Ordered: Orders Placed This Encounter  Procedures  . Basic metabolic panel  . CBC  . Magnesium  . MYOCARDIAL PERFUSION IMAGING  . LONG TERM MONITOR-LIVE TELEMETRY (3-14 DAYS)  . EKG 12-Lead   Medication Changes: No orders of the defined types were placed in this encounter.   Signed, Richardson Dopp, PA-C  07/14/2018 5:41 PM    [facility] [place], [place], [place]  [address] Phone: [phone]; Fax: [phone]

## 2018-07-13 NOTE — Telephone Encounter (Signed)
New Message          COVID-19 Pre-Screening Questions:   In the past 7 to 10 days have you had a cough,  shortness of breath, headache, congestion, fever (100 or greater) body aches, chills, sore throat, or sudden loss of taste or sense of smell? NO, pt has cough, but he said he has it all the time  Have you been around anyone with known Covid 19. NO  Have you been around anyone who is awaiting Covid 19 test results in the past 7 to 10 days? NO  Have you been around anyone who has been exposed to Covid 19, or has mentioned symptoms of Covid 19 within the past 7 to 10 days? NO  If you have any concerns/questions about symptoms patients report during screening (either on the phone or at threshold). Contact the provider seeing the patient or DOD for further guidance.  If neither are available contact a member of the leadership team.

## 2018-07-14 ENCOUNTER — Telehealth: Payer: Self-pay | Admitting: Radiology

## 2018-07-14 ENCOUNTER — Ambulatory Visit (INDEPENDENT_AMBULATORY_CARE_PROVIDER_SITE_OTHER): Payer: Medicare Other | Admitting: Physician Assistant

## 2018-07-14 ENCOUNTER — Encounter: Payer: Self-pay | Admitting: *Deleted

## 2018-07-14 ENCOUNTER — Encounter: Payer: Self-pay | Admitting: Physician Assistant

## 2018-07-14 ENCOUNTER — Other Ambulatory Visit: Payer: Self-pay

## 2018-07-14 VITALS — BP 118/60 | HR 72 | Ht 66.0 in | Wt 193.8 lb

## 2018-07-14 DIAGNOSIS — R0789 Other chest pain: Secondary | ICD-10-CM | POA: Diagnosis not present

## 2018-07-14 DIAGNOSIS — I493 Ventricular premature depolarization: Secondary | ICD-10-CM | POA: Diagnosis not present

## 2018-07-14 DIAGNOSIS — R42 Dizziness and giddiness: Secondary | ICD-10-CM | POA: Diagnosis not present

## 2018-07-14 DIAGNOSIS — I2510 Atherosclerotic heart disease of native coronary artery without angina pectoris: Secondary | ICD-10-CM

## 2018-07-14 DIAGNOSIS — I6523 Occlusion and stenosis of bilateral carotid arteries: Secondary | ICD-10-CM

## 2018-07-14 DIAGNOSIS — I10 Essential (primary) hypertension: Secondary | ICD-10-CM

## 2018-07-14 LAB — BASIC METABOLIC PANEL
BUN/Creatinine Ratio: 22 (ref 10–24)
BUN: 25 mg/dL (ref 8–27)
CO2: 22 mmol/L (ref 20–29)
Calcium: 9 mg/dL (ref 8.6–10.2)
Chloride: 104 mmol/L (ref 96–106)
Creatinine, Ser: 1.14 mg/dL (ref 0.76–1.27)
GFR calc Af Amer: 69 mL/min/{1.73_m2} (ref >59)
GFR calc non Af Amer: 60 mL/min/{1.73_m2} (ref >59)
Glucose: 93 mg/dL (ref 65–99)
Potassium: 5.6 mmol/L — ABNORMAL HIGH (ref 3.5–5.2)
Sodium: 141 mmol/L (ref 134–144)

## 2018-07-14 LAB — CBC
Hematocrit: 34.8 % — ABNORMAL LOW (ref 37.5–51.0)
Hemoglobin: 11.1 g/dL — ABNORMAL LOW (ref 13.0–17.7)
MCH: 28.9 pg (ref 26.6–33.0)
MCHC: 31.9 g/dL (ref 31.5–35.7)
MCV: 91 fL (ref 79–97)
Platelets: 185 10*3/uL (ref 150–450)
RBC: 3.84 x10E6/uL — ABNORMAL LOW (ref 4.14–5.80)
RDW: 14.4 % (ref 11.6–15.4)
WBC: 6.5 10*3/uL (ref 3.4–10.8)

## 2018-07-14 LAB — MAGNESIUM: Magnesium: 1.7 mg/dL (ref 1.6–2.3)

## 2018-07-14 NOTE — Patient Instructions (Addendum)
Medication Instructions:  Your physician recommends that you continue on your current medications as directed. Please refer to the Current Medication list given to you today.  If you need a refill on your cardiac medications before your next appointment, please call your pharmacy.   Lab work:  BMET MAG AND CBC TODAY   If you have labs (blood work) drawn today and your tests are completely normal, you will receive your results only by: Marland Kitchen MyChart Message (if you have MyChart) OR . A paper copy in the mail If you have any lab test that is abnormal or we need to change your treatment, we will call you to review the results.  Testing/Procedures: Your physician has requested that you have a lexiscan myoview. For further information please visit [email or web address]. Please follow instruction sheet, as given.  Your physician has requested that you have a carotid duplex. This test is an ultrasound of the carotid arteries in your neck. It looks at blood flow through these arteries that supply the brain with blood. Allow one hour for this exam. There are no restrictions or special instructions..  Your physician has recommended that you wear an event monitor. Event monitors are medical devices that record the heart's electrical activity. Doctors most often [place] these monitors to diagnose arrhythmias. Arrhythmias are problems with the speed or rhythm of the heartbeat. The monitor is a small, portable device. You can wear one while you do your normal daily activities. This is usually used to diagnose what is causing palpitations/syncope (passing out).   Follow-Up: At [facility], you and your health needs are our priority.  As part of our continuing mission to provide you with exceptional heart care, we have created designated Provider Care Teams.  These Care Teams include your primary Cardiologist (physician) and Advanced Practice Providers (APPs -  Physician Assistants and Nurse Practitioners) who all work  together to provide you with the care you need, when you need it. You will need a follow up appointment in:  3 weeks. IN OFFICE  Please call our office 2 months in advance to schedule this appointment.  You may see Sherren Mocha, MD  or one of the following Advanced Practice Providers on your designated Care Team: Richardson Dopp, PA-C  Any Other Special Instructions Will Be Listed Below (If Applicable).  Make sure you get some compression stockings Contact Urologist for  Stopping Flomax  Contact Primary Care Dr.

## 2018-07-14 NOTE — Telephone Encounter (Signed)
Enrolled patient for a 14 day Zio Telemetry monitor to be mailed. Brief instructions were gone over with the patient and he knows to expect the monitor to arrive in 3-4 days

## 2018-07-16 ENCOUNTER — Other Ambulatory Visit: Payer: Self-pay | Admitting: Physician Assistant

## 2018-07-16 ENCOUNTER — Telehealth (HOSPITAL_COMMUNITY): Payer: Self-pay | Admitting: *Deleted

## 2018-07-16 ENCOUNTER — Other Ambulatory Visit: Payer: Self-pay | Admitting: *Deleted

## 2018-07-16 ENCOUNTER — Telehealth: Payer: Self-pay | Admitting: Emergency Medicine

## 2018-07-16 DIAGNOSIS — R42 Dizziness and giddiness: Secondary | ICD-10-CM

## 2018-07-16 DIAGNOSIS — I2510 Atherosclerotic heart disease of native coronary artery without angina pectoris: Secondary | ICD-10-CM

## 2018-07-16 DIAGNOSIS — H938X9 Other specified disorders of ear, unspecified ear: Secondary | ICD-10-CM

## 2018-07-16 DIAGNOSIS — I493 Ventricular premature depolarization: Secondary | ICD-10-CM

## 2018-07-16 NOTE — Telephone Encounter (Signed)
New message   Pt c/o medication issue:  1. Name of Medication: amLODipine (NORVASC) 5 MG tablet  2. How are you currently taking this medication (dosage and times per day)? 1 time daily  3. Are you having a reaction (difficulty breathing--STAT)?no   4. What is your medication issue?Patient wants to know if he should still take this medication his PCP advised him not to take this medication. Please advise.

## 2018-07-16 NOTE — Telephone Encounter (Signed)
BPs look good. I agreed with him remaining off of Amlodipine. I'm not sure how it ended up back on his medication list. I have removed it from his list. He should remain OFF of Amlodipine. Richardson Dopp, PA-C    07/16/2018 3:44 PM

## 2018-07-16 NOTE — Telephone Encounter (Signed)
Pt contacted PCP prior to seeing Richardson Dopp, PA-C on 6/30 because he was having a lot of dizzy spells and GBS was in 80s, so he thought possibly related.  PCP advised pt to d/c Amlodipine for 2 weeks, monitor BP BID and call with those readings.  Pt called PCP the day he seen Scott and was advised to not restart Amlodipine.  Pt's paperwork from our office said TO take Amlodipine so pt was unsure of what to do.  Most recent BPs are: 7/1- 128/70, 123/60, 108/69, 121/61 6/30 at [facility]- 118/60 6/30- 130/79, 126/71 6/29- 115/58 6/28- 129/66, 125/61 All of these readings are with no Amlodipine and HR usually in the 60s. Pt states dizziness has improved since change was made to Flomax by Urologist.  Pt takes it QOD now.  Takes at night and says he feels better the day after when he doesn't take the Flomax the night before.  Advised I will send to [facility] to review and we will call back with any recommendations.

## 2018-07-16 NOTE — Telephone Encounter (Signed)
Patient given detailed instructions per Myocardial Perfusion Study Information Sheet for the test on 07/22/18 at 10:45. Patient notified to arrive 15 minutes early and that it is imperative to arrive on time for appointment to keep from having the test rescheduled.  If you need to cancel or reschedule your appointment, please call the office within 24 hours of your appointment. . Patient verbalized understanding.Veronia Beets

## 2018-07-18 ENCOUNTER — Ambulatory Visit (INDEPENDENT_AMBULATORY_CARE_PROVIDER_SITE_OTHER): Payer: Medicare Other

## 2018-07-18 DIAGNOSIS — R42 Dizziness and giddiness: Secondary | ICD-10-CM

## 2018-07-18 DIAGNOSIS — I493 Ventricular premature depolarization: Secondary | ICD-10-CM | POA: Diagnosis not present

## 2018-07-20 NOTE — Telephone Encounter (Signed)
Pt advised and will call if he has any problems.

## 2018-07-22 ENCOUNTER — Encounter (HOSPITAL_COMMUNITY): Payer: Self-pay

## 2018-07-22 ENCOUNTER — Other Ambulatory Visit: Payer: Medicare Other

## 2018-07-22 ENCOUNTER — Other Ambulatory Visit: Payer: Self-pay

## 2018-07-22 ENCOUNTER — Ambulatory Visit (HOSPITAL_COMMUNITY): Payer: Medicare Other | Attending: Physician Assistant

## 2018-07-22 DIAGNOSIS — R0789 Other chest pain: Secondary | ICD-10-CM

## 2018-07-22 DIAGNOSIS — I2510 Atherosclerotic heart disease of native coronary artery without angina pectoris: Secondary | ICD-10-CM | POA: Diagnosis not present

## 2018-07-22 DIAGNOSIS — I493 Ventricular premature depolarization: Secondary | ICD-10-CM

## 2018-07-22 LAB — MYOCARDIAL PERFUSION IMAGING
LV dias vol: 94 mL (ref 62–150)
LV sys vol: 34 mL
Peak HR: 68 {beats}/min
Rest HR: 54 {beats}/min
SDS: 1
SRS: 2
SSS: 3
TID: 1

## 2018-07-22 LAB — BASIC METABOLIC PANEL
BUN/Creatinine Ratio: 24 (ref 10–24)
BUN: 26 mg/dL (ref 8–27)
CO2: 21 mmol/L (ref 20–29)
Calcium: 8.8 mg/dL (ref 8.6–10.2)
Chloride: 104 mmol/L (ref 96–106)
Creatinine, Ser: 1.07 mg/dL (ref 0.76–1.27)
GFR calc Af Amer: 74 mL/min/{1.73_m2} (ref >59)
GFR calc non Af Amer: 64 mL/min/{1.73_m2} (ref >59)
Glucose: 88 mg/dL (ref 65–99)
Potassium: 4.9 mmol/L (ref 3.5–5.2)
Sodium: 139 mmol/L (ref 134–144)

## 2018-07-22 MED ORDER — TECHNETIUM TC 99M TETROFOSMIN IV KIT
10.8000 | PACK | Freq: Once | INTRAVENOUS | Status: AC | PRN
  Administered 2018-07-22: 10.8 via INTRAVENOUS
  Filled 2018-07-22: qty 11

## 2018-07-22 MED ORDER — TECHNETIUM TC 99M TETROFOSMIN IV KIT
31.9000 | PACK | Freq: Once | INTRAVENOUS | Status: AC | PRN
  Administered 2018-07-22: 31.9 via INTRAVENOUS
  Filled 2018-07-22: qty 32

## 2018-07-22 MED ORDER — REGADENOSON 0.4 MG/5ML IV SOLN
0.4000 mg | Freq: Once | INTRAVENOUS | Status: AC
  Administered 2018-07-22: 0.4 mg via INTRAVENOUS

## 2018-07-23 ENCOUNTER — Ambulatory Visit (HOSPITAL_COMMUNITY)
Admission: RE | Admit: 2018-07-23 | Discharge: 2018-07-23 | Disposition: A | Discharge status: Home / Self Care | Payer: Medicare Other | Source: Ambulatory Visit | Attending: Cardiology | Admitting: Cardiology

## 2018-07-23 ENCOUNTER — Telehealth: Payer: Self-pay | Admitting: *Deleted

## 2018-07-23 DIAGNOSIS — H938X9 Other specified disorders of ear, unspecified ear: Secondary | ICD-10-CM | POA: Insufficient documentation

## 2018-07-23 DIAGNOSIS — R42 Dizziness and giddiness: Secondary | ICD-10-CM | POA: Diagnosis present

## 2018-07-23 MED ORDER — ACETAMINOPHEN 500 MG PO TABS: 500.0000 mg | ORAL_TABLET | Freq: Four times a day (QID) | ORAL | Status: DC | PRN

## 2018-07-23 NOTE — Telephone Encounter (Signed)
-----   Message from Nuala Alpha, LPN sent at 04/16/1425  8:21 AM EDT -----  ----- Message ----- From: Sharmon Revere Sent: 07/22/2018   5:51 PM EDT To: [address] Triage  Please call the patient. His stress test is normal.  Recommendations:  - Continue current medications and follow up as planned.  Richardson Dopp, PA-C    07/22/2018 5:50 PM

## 2018-07-23 NOTE — Telephone Encounter (Signed)
Pt has been notified of both lab and Myoview results by phone with verbal understanding. Pt states he saw on his AVS med list that Tramadol was there and Tylenol 650 mg. Pt states he no longer takes Tramadol and asked for it to be removed from med list, as well as Tylenol he states he is uses the 500 mg PRN not the 650. I have corrected med list per pt request. He just wanted to make sure the doctor had the correct medications that he is on. Pt thanked me for the call. Pt has appt today for carotid at 3 pm.

## 2018-07-24 ENCOUNTER — Telehealth: Payer: Self-pay | Admitting: Physician Assistant

## 2018-07-24 ENCOUNTER — Encounter: Payer: Self-pay | Admitting: Physician Assistant

## 2018-07-24 DIAGNOSIS — I6523 Occlusion and stenosis of bilateral carotid arteries: Secondary | ICD-10-CM

## 2018-07-24 NOTE — Telephone Encounter (Signed)
New Message   Patient is returning call in reference to doppler study results. Please call.

## 2018-07-24 NOTE — Telephone Encounter (Signed)
-----   Message from Nuala Alpha, LPN sent at 2/57/5051  2:25 PM EDT -----  ----- Message ----- From: Liliane Shi, PA-C Sent: 07/24/2018   2:17 PM EDT To: [address] Triage  Please call the patient.   The ultrasound shows mild plaque in bilateral carotid arteries but no significant narrowing.  There is some plaque in the artery going to the right arm as well.   Recommendations:  - Continue current medications and follow up as planned.   - Send copy to PCP  - Repeat US in 1 year.  Richardson Dopp, PA-C    07/24/2018 2:14 PM

## 2018-07-24 NOTE — Telephone Encounter (Signed)
Pt has been notified of carotid u/s results and recommendations. Pt is agreeable to repeat carotids in 1 yr. Order placed in Epic today. Pt thanked me for the call. Patient notified of result.  I will forward results to PCP Dr. Domenick Gong. Please refer to phone note from today for complete details.   Julaine Hua, [facility] 07/24/2018 3:34 PM

## 2018-08-05 ENCOUNTER — Ambulatory Visit: Payer: Medicare Other | Admitting: Cardiovascular Disease

## 2018-08-05 ENCOUNTER — Encounter: Payer: Self-pay | Admitting: Physician Assistant

## 2018-08-05 ENCOUNTER — Other Ambulatory Visit: Payer: Self-pay

## 2018-08-05 ENCOUNTER — Ambulatory Visit (INDEPENDENT_AMBULATORY_CARE_PROVIDER_SITE_OTHER): Payer: Medicare Other | Admitting: Physician Assistant

## 2018-08-05 VITALS — BP 130/60 | HR 55 | Ht 66.0 in | Wt 193.0 lb

## 2018-08-05 DIAGNOSIS — I2510 Atherosclerotic heart disease of native coronary artery without angina pectoris: Secondary | ICD-10-CM

## 2018-08-05 DIAGNOSIS — I493 Ventricular premature depolarization: Secondary | ICD-10-CM

## 2018-08-05 DIAGNOSIS — R42 Dizziness and giddiness: Secondary | ICD-10-CM

## 2018-08-05 DIAGNOSIS — I10 Essential (primary) hypertension: Secondary | ICD-10-CM

## 2018-08-05 DIAGNOSIS — I771 Stricture of artery: Secondary | ICD-10-CM

## 2018-08-05 NOTE — Patient Instructions (Signed)
Medication Instructions:   Your physician recommends that you continue on your current medications as directed. Please refer to the Current Medication list given to you today.   If you need a refill on your cardiac medications before your next appointment, please call your pharmacy.   Lab work: NONE ORDERED  TODAY    If you have labs (blood work) drawn today and your tests are completely normal, you will receive your results only by: . MyChart Message (if you have MyChart) OR . A paper copy in the mail If you have any lab test that is abnormal or we need to change your treatment, we will call you to review the results.  Testing/Procedures: NONE ORDERED  TODAY     Follow-Up: At [facility], you and your health needs are our priority.  As part of our continuing mission to provide you with exceptional heart care, we have created designated Provider Care Teams.  These Care Teams include your primary Cardiologist (physician) and Advanced Practice Providers (APPs -  Physician Assistants and Nurse Practitioners) who all work together to provide you with the care you need, when you need it. You will need a follow up appointment in:  6 months.  Please call our office 2 months in advance to schedule this appointment.  You may see Michael Cooper, MD or one of the following Advanced Practice Providers on your designated Care Team: Scott Weaver, PA-C Vin Bhagat, PA-C . Janine Hammond, NP  Any Other Special Instructions Will Be Listed Below (If Applicable).    

## 2018-08-05 NOTE — Progress Notes (Signed)
Cardiology Office Note:    Date:  08/05/2018   ID:  [name] Ray, DOB 05-14-1933, MRN [id number]  PCP:  Haywood Pao, MD  Cardiologist:  Sherren Mocha, MD   Electrophysiologist:  None   Referring MD: Haywood Pao, MD   Chief Complaint  Patient presents with  . Follow-up    dizziness, CAD     History of Present Illness:    [name] Ray is a 85 y.o. male with:  Coronary artery disease  ? S/p Inf MI in 2002 tx with PCI ? S/p Taxus DES to LAD in 2004 ? S/p Cypher DES to pLAD and Cypher DES to mLCx in 05/2004 ? LHC in 2011 - stents patent  Diabetes Mellitus  Hypertension   Hyperlipidemia  Carotid artery disease  BPH  Prostate CA s/p radiation  [name] Ray was last seen 07/14/2018 with complaints of dizziness.  Etiology for his dizziness was not entirely clear.  I asked him to follow-up with primary care for possible hypoglycemia.  I also asked him to follow-up with urology for possible relation to Flomax therapy.  An event monitor was obtained.  Results are still pending.  [name] Ray complained of some chest pain.  Stress testing was normal.  Carotid US demonstrated mild bilat ICA stenosis and evidence of R subclavian stenosis.  [name] Ray returns for follow up.  [name] Ray is here alone.  [name] Ray has occasional chest pain that is brief.  This is a chronic symptom without change.  [name] Ray notes indigestion as well and is actually having some indigestion symptoms today.  [name] Ray has shortness of breath with some activities, but this is not worsening.  [name] Ray has not had paroxysmal nocturnal dyspnea.  [name] Ray wear compression stockings.  [name] Ray has not had syncope.  [name] Ray has cut back on Flomax to 3 days a week now.  [name] Ray feels like this may be helping.    Prior CV studies:   The following studies were reviewed today:  Myoview 07/22/2018 EF 63, no ischemia or infarction, low risk study  Carotid US 07/23/2018 Bilateral ICA 1-39; right subclavian stenosis  Carotid US 05/14/16 Bilat ICA 1-39  Echo 05/06/14 Mild conc  LVH, EF 65-70, no RWMA, Gr 1 DD, trivial TR  Cardiac Catheterization 9/11:  EF 60% RCA proximal stent patent with 30-40% ISR, mid 30-40%, 40% prior to bifurcation into PLV and PDA RI ostial 30% LCx 30%, mid to distal stents patent with minimal ISR LAD proximal stent patent with mild ISR, proximal LAD before the stent 50-60% (not flow-limiting).  Medical therapy was continued.  Past Medical History:  Diagnosis Date  . Adenomatous polyp    history  . Arthritis    knees , shoulders, hands   . Atrial bigeminy    in the past  . BPH (benign prostatic hyperplasia)   . Carotid artery disease ([place])    a. Doppler, January, 2012, 40-59% bilateral stenoses;  b.  Carotids 4/14:  40-59% bilat ICA, occluded R vertebral artery; f/u 1 year // Carotid US 07/2018:  Bilat ICA 1-39; R subclavian stenosis >> rpt in 1 year   . Colon polyps   . Coronary artery disease    acute inferior MI Jan 2002 with stent / Taxus stent LAD 2004 / Cypher stent prox LAD and cypher stent mid circ May 2006 / nuclear stress Dec 2009 normal EF 63% / nuclear August 2011 EF 62% / inferior thining and very mild ischemia inferior and apex / catherization Sept 2011 EF 60%  mild  to moderate diffuse RCA disease, moderate discrete LAD stenosis before the stent in the proximal LAD med tx rec  . Diverticulosis   . Dyslipidemia   . Ejection fraction   . Fatty liver    history  . GERD (gastroesophageal reflux disease)   . Hypertension    [facility] - 5 stents total , last cardiac cath. 2011.  Cleared for  surgery by Dr. [name]- 04/23/2013  . IBS (irritable bowel syndrome)   . Myocardial infarction ([place]) 2002  . Myoview    Myoview 07/2018: EF 63, inferior artifact, no ischemia or scar; Low Risk  . Numbness and tingling in hands    resolved  . Overweight(278.02)   . Prostate cancer ([place]) approx 2016   treated /w  Lupron injection x1, then radium  . Shortness of breath dyspnea   . Type II diabetes mellitus ([place])    Surgical Hx:  The patient  has a past surgical history that includes Neuroplasty / transposition median nerve at carpal tunnel (Left); Prostate biopsy; Total knee arthroplasty (Right, 05/24/2013); Total knee arthroplasty (Right, 05/24/2013); Cardiac catheterization; Coronary angioplasty with stent; Total knee arthroplasty (Left, 02/14/2014); Colonoscopy; Eye surgery (Bilateral); Lumbar laminectomy/decompression microdiscectomy (N/A, 09/11/2017); and Total hip arthroplasty (Left, 01/26/2018).   Current Medications: Current Meds  Medication Sig  . acetaminophen (TYLENOL) 500 MG tablet Take 1 tablet (500 mg total) by mouth every 6 (six) hours as needed for mild pain.  Marland Kitchen amoxicillin (AMOXIL) 500 MG capsule Take 2,000 mg by mouth See admin instructions. Take 4 capsules (2000 mg) by mouth 1 prior to dental appointments.  . clopidogrel (PLAVIX) 75 MG tablet Take 75 mg by mouth daily.   . Guaifenesin (MUCINEX MAXIMUM STRENGTH) 1200 MG TB12 Take by mouth daily.  . JENTADUETO XR 2.05-998 MG TB24 Take 1 tablet by mouth 2 (two) times daily.  Marland Kitchen loperamide (IMODIUM A-D) 2 MG tablet Take 2 mg by mouth 4 (four) times daily as needed for diarrhea or loose stools.  Marland Kitchen loratadine (CLARITIN) 10 MG tablet Take 10 mg by mouth daily as needed for allergies.   . nitroGLYCERIN (NITROSTAT) 0.4 MG SL tablet Place 1 tablet (0.4 mg total) under the tongue every 5 (five) minutes as needed for chest pain. Please keep upcoming appt. Thank you  . omeprazole (PRILOSEC) 20 MG capsule Take 20 mg by mouth daily.   . ramipril (ALTACE) 10 MG capsule Take 10 mg by mouth daily after breakfast.   . simvastatin (ZOCOR) 20 MG tablet Take 20 mg by mouth at bedtime.    . tamsulosin (FLOMAX) 0.4 MG CAPS capsule Take 0.4 mg by mouth every other day.      Allergies:   Atorvastatin, Rosuvastatin, and Clindamycin   Social History   Tobacco Use  . Smoking status: Never Smoker  . Smokeless tobacco: Never Used  Substance Use Topics  . Alcohol use: No  . Drug  use: No     Family Hx: The patient's family history includes Heart attack in his paternal grandfather; Heart disease in his brother and father; Hypertension in his father.  ROS:   Please see the history of present illness.    ROS All other systems reviewed and are negative.   EKGs/Labs/Other Test Reviewed:    EKG:  EKG is not ordered today.  The ekg ordered today demonstrates n/a  Recent Labs: 01/21/2018: ALT 10 07/14/2018: Hemoglobin 11.1; Magnesium 1.7; Platelets 185 07/22/2018: BUN 26; Creatinine, Ser 1.07; Potassium 4.9; Sodium 139   Recent Lipid Panel  No results found for: CHOL, TRIG, HDL, CHOLHDL, LDLCALC, LDLDIRECT  Physical Exam:    VS:  BP 130/60 (BP Location: Left Arm, Cuff Size: Normal)   Pulse (!) 55   Ht 5\' 6"  (1.676 m)   Wt 193 lb (87.5 kg)   SpO2 97%   BMI 31.15 kg/m     Vitals:   08/05/18 1505 08/05/18 1551 08/05/18 1552  BP: 128/60 130/60 (R arm) 130/60 (L arm)  Pulse: (!) 55    SpO2: 97%    Weight: 193 lb (87.5 kg)    Height: 5\' 6"  (1.676 m)      Wt Readings from Last 3 Encounters:  08/05/18 193 lb (87.5 kg)  07/22/18 193 lb (87.5 kg)  07/14/18 193 lb 12.8 oz (87.9 kg)     Physical Exam  Constitutional: [name] Ray is oriented to person, place, and time. [name] Ray appears well-developed and well-nourished. No distress.  HENT:  Head: Normocephalic and atraumatic.  Eyes: No scleral icterus.  Neck: No JVD present. No thyromegaly present.  Cardiovascular: Normal rate, regular rhythm and normal heart sounds.  No murmur heard. No supraclavicular bruits bilaterally  Pulmonary/Chest: Effort normal and breath sounds normal. [name] Ray has no rales.  Abdominal: Soft. There is no hepatomegaly.  Musculoskeletal:        General: Edema (trace bilat LE edema) present.  Lymphadenopathy:    [name] Ray has no cervical adenopathy.  Neurological: [name] Ray is alert and oriented to person, place, and time.  Skin: Skin is warm and dry.  Psychiatric: [name] Ray has a normal mood and affect.    ASSESSMENT  & PLAN:    1. Dizziness Etiology not clear.  [name] Ray has some orthostatic intolerance.  [name] Ray also has a sensation of being dizzy all the time.  His BP, for the most part, looks optimal.  I would not change his antihypertensive regimen any.  [name] Ray is not having any spinning sensation.  Event monitor pending.  If this is unrevealing, I have suggested [name] Ray follow up with his PCP to determine if referral to neurology would be helpful.    2. Coronary artery disease involving native coronary artery of native heart without angina pectoris History of inferior MI in 2002 treated with PCI.  Since then, [name] Ray has had a Taxus drug-eluting stent to the LAD in 2004 and Cypher drug-eluting stent to the LAD and LCx in 2006.  Stents were patent by cardiac catheterization 2011.  Recent Myoview is low risk.  [name] Ray has chest pain at times that, overall, seems to be stable.  [name] Ray also has a lot of indigestion. If this continues, [name] Ray may need to follow up with primary care to determine if [name] Ray needs referral to GI.  Continue Plavix, statin.    3. PVC's (premature ventricular contractions) Event monitor pending to determine PVC burden.    4. Essential hypertension The patient's blood pressure is controlled on his current regimen.  Continue current therapy.    5. Stenosis of right subclavian artery (HCC) His BPs are equal in both arms.  [name] Ray has no steal symptoms.  I do not think this is contributing to his dizziness at all.  It seems to be an incidental finding.  Repeat dopplers will be obtained next year.     Dispo:  Return in about 6 months (around 02/05/2019) for Routine Follow Up, w/ Dr. Burt Knack, or Richardson Dopp, PA-C.   Medication Adjustments/Labs and Tests Ordered: Current medicines are reviewed at length with the patient today.  Concerns regarding medicines are outlined above.  Tests Ordered: No orders of the defined types were placed in this encounter.  Medication Changes: No orders of the defined types were placed in this  encounter.   Signed, Richardson Dopp, PA-C  08/05/2018 3:55 PM    [facility] [place], [place],   [address] Phone: [phone]; Fax: [phone]

## 2018-08-07 ENCOUNTER — Other Ambulatory Visit: Payer: Self-pay

## 2018-08-07 ENCOUNTER — Other Ambulatory Visit: Payer: Self-pay | Admitting: Physician Assistant

## 2018-08-07 DIAGNOSIS — R42 Dizziness and giddiness: Secondary | ICD-10-CM

## 2018-08-07 DIAGNOSIS — I493 Ventricular premature depolarization: Secondary | ICD-10-CM

## 2018-08-14 ENCOUNTER — Telehealth: Payer: Self-pay

## 2018-09-03 NOTE — Telephone Encounter (Signed)
Opened in error

## 2019-01-06 ENCOUNTER — Telehealth: Payer: Self-pay | Admitting: Cardiovascular Disease

## 2019-01-06 MED ORDER — AMLODIPINE BESYLATE 2.5 MG PO TABS: 2.5000 mg | ORAL_TABLET | Freq: Every day | ORAL | 3 refills | Status: DC

## 2019-01-06 NOTE — Telephone Encounter (Signed)
Spoke with patient and advised him to restart amlodipine 2.5 mg daily. I have sent in the order to his preferred pharmacy. The patient verbalized understanding and will send [place] BP readings after 2 weeks.

## 2019-01-06 NOTE — Telephone Encounter (Signed)
Restart Amlodipine at 2.5 mg once daily  Continue to monitor blood pressure. Call/send readings after 2 weeks. Richardson Dopp, PA-C    01/06/2019 1:56 PM

## 2019-01-06 NOTE — Telephone Encounter (Signed)
Pt c/o BP issue: STAT if pt c/o blurred vision, one-sided weakness or slurred speech  1. What are your last 5 BP readings? 140/77, 167/71, 153/72, 161/72, 162/83,  2. Are you having any other symptoms (ex. Dizziness, headache, blurred vision, passed out)? no  3. What is your BP issue? Patient calling requesting to speak with a nurse about his BP increasing. He wants to know if he needs to go back on his BP medication amLODipine.

## 2019-01-06 NOTE — Telephone Encounter (Signed)
Patient states that his blood pressure has been rising since September and has recently gotten higher with readings as high as 160s/70s. He denies any dizziness but reports he does get light headed every once in a while and does get headaches.   He was taken off of his amlodipine 5 mg daily in 06/2018 by his PCP due to dizziness. He saw Richardson Dopp, PA-C shortly after this who agreed that the patient could remain off of amlodipine. His urologist also advised him to reduce his Flomax at this time. Dizziness resolved after these changes were made. Patient states that his blood pressure was stable after he stopped amlodipine until a few months ago.   Patient would like to know if he should go back on his amlodipine.

## 2019-01-07 NOTE — Telephone Encounter (Signed)
Agree. thanks

## 2019-01-22 ENCOUNTER — Telehealth: Payer: Self-pay | Admitting: Physician Assistant

## 2019-01-22 MED ORDER — AMLODIPINE BESYLATE 5 MG PO TABS: 5.0000 mg | ORAL_TABLET | Freq: Every day | ORAL | 3 refills | Status: DC

## 2019-01-22 NOTE — Addendum Note (Signed)
Addended by: Mady Haagensen on: 01/22/2019 02:30 PM   Modules accepted: Orders

## 2019-01-22 NOTE — Telephone Encounter (Signed)
New Message  Pt c/o BP issue: STAT if pt c/o blurred vision, one-sided weakness or slurred speech  1. What are your last 5 BP readings? 154/75; 61; 155/82; 56; 163/78; 61; 138/74; 61; 160/82; 58  2. Are you having any other symptoms (ex. Dizziness, headache, blurred vision, passed out)? Headache, dizziness  3. What is your BP issue? Blood pressure readings was requested by Richardson Dopp

## 2019-01-22 NOTE — Telephone Encounter (Signed)
BP above goal. Increase Amlodipine to 5 mg once daily. Continue to monitor and call if BP remains > 130/80. Richardson Dopp, PA-C    01/22/2019 1:19 PM

## 2019-01-22 NOTE — Telephone Encounter (Signed)
I called and spoke with patient, he is aware per Scott to increase Amlodipine to 5MG  once a day. Patient will continue to monitor blood pressure and call if blood pressure remains over 130/80. Patient did not need a new prescription for this yet, he has several 2.5MG  tablets left; advised to take 2 of these until he runs out and we can call in the 5MG  tablet.

## 2019-02-02 ENCOUNTER — Telehealth: Payer: Self-pay | Admitting: Cardiovascular Disease

## 2019-02-02 NOTE — Telephone Encounter (Signed)
New Message  We are recommending the COVID-19 vaccine to all of our patients. Cardiac medications (including blood thinners) should not deter anyone from being vaccinated and there is no need to hold any of those medications prior to vaccine administration.     Currently, there is a hotline to call (active 01/22/19) to schedule vaccination appointments as no walk-ins will be accepted.   Number: [phone].    If an appointment is not available please go to [email or web address] to sign up for notification when additional vaccine appointments are available.   If you have further questions or concerns about the vaccine process, please visit [email or web address] or contact your primary care physician.   

## 2019-05-10 ENCOUNTER — Encounter: Payer: Self-pay | Admitting: Cardiovascular Disease

## 2019-05-10 ENCOUNTER — Other Ambulatory Visit: Payer: Self-pay

## 2019-05-10 ENCOUNTER — Ambulatory Visit: Payer: Medicare PPO | Admitting: Cardiovascular Disease

## 2019-05-10 VITALS — BP 130/62 | HR 52 | Ht 66.0 in | Wt 198.8 lb

## 2019-05-10 DIAGNOSIS — E782 Mixed hyperlipidemia: Secondary | ICD-10-CM

## 2019-05-10 DIAGNOSIS — I25119 Atherosclerotic heart disease of native coronary artery with unspecified angina pectoris: Secondary | ICD-10-CM

## 2019-05-10 DIAGNOSIS — I10 Essential (primary) hypertension: Secondary | ICD-10-CM | POA: Diagnosis not present

## 2019-05-10 NOTE — Progress Notes (Signed)
Cardiology Office Note:    Date:  05/10/2019   ID:  [name] Ray, DOB 07-29-1933, MRN [id number]  PCP:  [name] Pao, MD  Cardiologist:  [name] Mocha, MD  Electrophysiologist:  None   Referring MD: [name] Pao, MD   Chief Complaint  Patient presents with  . Coronary Artery Disease    History of Present Illness:    [name] Ray is a 86 y.o. male with a hx of:  Coronary artery disease  ? S/p Inf MI in 2002 tx with PCI ? S/p Taxus DES to LAD in 2004 ? S/p Cypher DES to pLAD and Cypher DES to mLCx in 05/2004 ? LHC in 2011 - stents patent  Diabetes Mellitus  Hypertension   Hyperlipidemia  Carotid artery disease  BPH  Prostate CA s/p radiation  The patient is here alone today. He's had carpal tunnel problems in the past, and has developed progressive right hand pain and weakness. He wants to make sure he is healthy enough to have carpal tunnel surgery. He has rare chest discomfort, but he hasn't taken NTG in a long time. He can't remember the last time he took it, but still carries it with him. He mows his 1.5 acre lawn with a zero turn mower. He has some lightheadedness at times but no falls in the past year. Today, he denies symptoms of palpitations, shortness of breath, orthopnea, PND, lower extremity edema.   Past Medical History:  Diagnosis Date  . Adenomatous polyp    history  . Arthritis    knees , shoulders, hands   . Atrial bigeminy    in the past  . BPH (benign prostatic hyperplasia)   . Carotid artery disease ([place])    a. Doppler, January, 2012, 40-59% bilateral stenoses;  b.  Carotids 4/14:  40-59% bilat ICA, occluded R vertebral artery; f/u 1 year // Carotid US 07/2018:  Bilat ICA 1-39; R subclavian stenosis >> rpt in 1 year   . Colon polyps   . Coronary artery disease    acute inferior MI Jan 2002 with stent / Taxus stent LAD 2004 / Cypher stent prox LAD and cypher stent mid circ May 2006 / nuclear stress Dec 2009 normal EF 63% / nuclear  August 2011 EF 62% / inferior thining and very mild ischemia inferior and apex / catherization Sept 2011 EF 60%  mild to moderate diffuse RCA disease, moderate discrete LAD stenosis before the stent in the proximal LAD med tx rec  . Diverticulosis   . Dyslipidemia   . Ejection fraction   . Fatty liver    history  . GERD (gastroesophageal reflux disease)   . Hypertension    [facility] - 5 stents total , last cardiac cath. 2011.  Cleared for  surgery by Dr. Ron Ray- 04/23/2013  . IBS (irritable bowel syndrome)   . Myocardial infarction ([place]) 2002  . Myoview    Myoview 07/2018: EF 63, inferior artifact, no ischemia or scar; Low Risk  . Numbness and tingling in hands    resolved  . Overweight(278.02)   . Prostate cancer ([name] Ray) approx 2016   treated /w  Lupron injection x1, then radium  . Shortness of breath dyspnea   . Type II diabetes mellitus ([place])     Past Surgical History:  Procedure Laterality Date  . CARDIAC CATHETERIZATION     last cath- 2006  . COLONOSCOPY    . CORONARY ANGIOPLASTY WITH STENT PLACEMENT     multiple stents  in LAD:  "I've got 5 stents" (05/24/2013)  . EYE SURGERY Bilateral    cataract surgery with lens implants  . LUMBAR LAMINECTOMY/DECOMPRESSION MICRODISCECTOMY N/A 09/11/2017   Procedure: LAMINECTOMY AND FORAMINOTOMY LUMBAR One-Two, Two-Three, Three-Four, Four-Five, Five-Sacral One;  Surgeon: Newman Pies, MD;  Location: [place];  Service: Neurosurgery;  Laterality: N/A;  LAMINECTOMY AND FORAMINOTOMY LUMBAR One-Two,Two-Three, Three-Four, Four-Five, Five-Sacral One  . NEUROPLASTY / TRANSPOSITION MEDIAN NERVE AT CARPAL TUNNEL Left    also surgery on thumb, elbow- nerve release   . PROSTATE BIOPSY     followed later by HDR  . TOTAL HIP ARTHROPLASTY Left 01/26/2018   Procedure: TOTAL HIP ARTHROPLASTY ANTERIOR APPROACH;  Surgeon: [name] Arabian, MD;  Location: [facility];  Service: Orthopedics;  Laterality: Left;  171min  . TOTAL KNEE ARTHROPLASTY Right 05/24/2013  .  TOTAL KNEE ARTHROPLASTY Right 05/24/2013   Procedure: RIGHT TOTAL KNEE ARTHROPLASTY;  Surgeon: [name] Huger, MD;  Location: [place];  Service: Orthopedics;  Laterality: Right;  . TOTAL KNEE ARTHROPLASTY Left 02/14/2014   Procedure: LEFT TOTAL KNEE ARTHROPLASTY;  Surgeon: [name] Huger, MD;  Location: [place];  Service: Orthopedics;  Laterality: Left;    Current Medications: Current Meds  Medication Sig  . acetaminophen (TYLENOL) 500 MG tablet Take 1 tablet (500 mg total) by mouth every 6 (six) hours as needed for mild pain.  [name] Ray amLODipine (NORVASC) 5 MG tablet Take 1 tablet (5 mg total) by mouth daily.  [name] Ray amoxicillin (AMOXIL) 500 MG capsule Take 2,000 mg by mouth See admin instructions. Take 4 capsules (2000 mg) by mouth 1 prior to dental appointments.  . clopidogrel (PLAVIX) 75 MG tablet Take 75 mg by mouth daily.   . Guaifenesin (MUCINEX MAXIMUM STRENGTH) 1200 MG TB12 Take by mouth daily.  . JENTADUETO XR 2.05-998 MG TB24 Take 1 tablet by mouth 2 (two) times daily.  [name] Ray loperamide (IMODIUM A-D) 2 MG tablet Take 2 mg by mouth 4 (four) times daily as needed for diarrhea or loose stools.  [name] Ray loratadine (CLARITIN) 10 MG tablet Take 10 mg by mouth daily as needed for allergies.   . nitroGLYCERIN (NITROSTAT) 0.4 MG SL tablet Place 1 tablet (0.4 mg total) under the tongue every 5 (five) minutes as needed for chest pain. Please keep upcoming appt. Thank you  . omeprazole (PRILOSEC) 20 MG capsule Take 20 mg by mouth daily.   . ramipril (ALTACE) 10 MG capsule Take 10 mg by mouth daily after breakfast.   . simvastatin (ZOCOR) 20 MG tablet Take 20 mg by mouth at bedtime.    . tamsulosin (FLOMAX) 0.4 MG CAPS capsule Take 0.4 mg by mouth every other day.      Allergies:   Atorvastatin, Rosuvastatin, and Clindamycin   Social History   Socioeconomic History  . Marital status: Married    Spouse name: Not on file  . Number of children: Not on file  . Years of education: Not on file  . Highest education level: Not  on file  Occupational History  . Not on file  Tobacco Use  . Smoking status: Never Smoker  . Smokeless tobacco: Never Used  Substance and Sexual Activity  . Alcohol use: No  . Drug use: No  . Sexual activity: Not Currently  Other Topics Concern  . Not on file  Social History Narrative  . Not on file   Social Determinants of Health   Financial Resource Strain:   . Difficulty of Paying Living Expenses:   Food Insecurity:   .  Worried About Charity fundraiser in the Last Year:   . Arboriculturist in the Last Year:   Transportation Needs:   . Film/video editor (Medical):   [name] Ray Lack of Transportation (Non-Medical):   Physical Activity:   . Days of Exercise per Week:   . Minutes of Exercise per Session:   Stress:   . Feeling of Stress :   Social Connections:   . Frequency of Communication with Friends and Family:   . Frequency of Social Gatherings with Friends and Family:   . Attends Religious Services:   . Active Member of Clubs or Organizations:   . Attends Archivist Meetings:   [name] Ray Marital Status:      Family History: The patient's family history includes Heart attack in his paternal grandfather; Heart disease in his brother and father; Hypertension in his father.  ROS:   Please see the history of present illness.    All other systems reviewed and are negative.  EKGs/Labs/Other Studies Reviewed:    The following studies were reviewed today: Stress Myoview 07/22/2018: Study Highlights   Nuclear stress EF: 63%. The left ventricular ejection fraction is normal (55-65%).  There was no ST segment deviation noted during stress.  Defect 1: There is a small defect of mild severity present in the basal inferior and mid inferior location. This is consistent with artifart.  This is a low risk study. No evidence of ischemia or infarction  The study is normal.   Outpatient Monitor 08/07/2018: Study Highlights  The basic rhythm is normal sinus with an average  heart rate of 63 bpm Thre is no atrial fibrillation or flutter There are no pathologic pauses > 3 seconds There are rare PVC's and a 7 beat run of NSVT is present There are rare PAC's and short supraventricular runs of up to 10 beats No sustained arrhythmias    EKG:  EKG is ordered today.  The ekg ordered today demonstrates sinus bradycardia 50 bpm, within normal limits  Recent Labs: 07/14/2018: Hemoglobin 11.1; Magnesium 1.7; Platelets 185 07/22/2018: BUN 26; Creatinine, Ser 1.07; Potassium 4.9; Sodium 139  Recent Lipid Panel No results found for: CHOL, TRIG, HDL, CHOLHDL, VLDL, LDLCALC, LDLDIRECT  Physical Exam:    VS:  BP 130/62   Pulse (!) 52   Ht 5\' 6"  (1.676 m)   Wt 198 lb 12.8 oz (90.2 kg)   BMI 32.09 kg/m     Wt Readings from Last 3 Encounters:  05/10/19 198 lb 12.8 oz (90.2 kg)  08/05/18 193 lb (87.5 kg)  07/22/18 193 lb (87.5 kg)     GEN:  Well nourished, well developed in no acute distress HEENT: Normal NECK: No JVD; No carotid bruits LYMPHATICS: No lymphadenopathy CARDIAC: bradycardic and regular, no murmurs, rubs, gallops RESPIRATORY:  Clear to auscultation without rales, wheezing or rhonchi  ABDOMEN: Soft, non-tender, non-distended MUSCULOSKELETAL:  No edema; No deformity  SKIN: Warm and dry NEUROLOGIC:  Alert and oriented x 3 PSYCHIATRIC:  Normal affect   ASSESSMENT:    1. Coronary artery disease involving native coronary artery of native heart with angina pectoris ([place])   2. Essential hypertension   3. Mixed hyperlipidemia    PLAN:    In order of problems listed above:  1. The patient seems to be doing well on his current medical regimen with no angina. He is treated with clopidogrel for antiplatelet Rx, amlodipine for anti-anginal Rx, and simvastatin for lipid lowering. 2. BP controlled on amlodipine  3. Lipids excellent - couldn't tolerate a high intensity statin. LDL 54 mg/dL, ALT 9 mg/dL.   Medication Adjustments/Labs and Tests  Ordered: Current medicines are reviewed at length with the patient today.  Concerns regarding medicines are outlined above.  Orders Placed This Encounter  Procedures  . EKG 12-Lead   No orders of the defined types were placed in this encounter.   Patient Instructions  Medication Instructions:  Your provider recommends that you continue on your current medications as directed. Please refer to the Current Medication list given to you today.   *If you need a refill on your cardiac medications before your next appointment, please call your pharmacy*   Follow-Up: At [facility], you and your health needs are our priority.  As part of our continuing mission to provide you with exceptional heart care, we have created designated Provider Care Teams.  These Care Teams include your primary Cardiologist (physician) and Advanced Practice Providers (APPs -  Physician Assistants and Nurse Practitioners) who all work together to provide you with the care you need, when you need it. Your next appointment:   12 month(s) The format for your next appointment:   In Person Provider:   You may see [name] Mocha, MD or one of the following Advanced Practice Providers on your designated Care Team:    Richardson Dopp, PA-C  Vin [place], PA-C  Daune Perch, [place]      Signed, [name] Mocha, MD  05/10/2019 2:11 PM    [place]

## 2019-05-10 NOTE — Patient Instructions (Signed)

## 2019-05-18 DIAGNOSIS — H9191 Unspecified hearing loss, right ear: Secondary | ICD-10-CM | POA: Diagnosis not present

## 2019-05-18 DIAGNOSIS — H6121 Impacted cerumen, right ear: Secondary | ICD-10-CM | POA: Diagnosis not present

## 2019-07-27 ENCOUNTER — Ambulatory Visit (HOSPITAL_COMMUNITY)
Admission: RE | Admit: 2019-07-27 | Discharge: 2019-07-27 | Disposition: A | Discharge status: Home / Self Care | Payer: Medicare PPO | Source: Ambulatory Visit | Attending: Cardiology | Admitting: Cardiology

## 2019-07-27 ENCOUNTER — Encounter: Payer: Self-pay | Admitting: Physician Assistant

## 2019-07-27 ENCOUNTER — Other Ambulatory Visit (HOSPITAL_COMMUNITY): Payer: Self-pay | Admitting: Physician Assistant

## 2019-07-27 ENCOUNTER — Other Ambulatory Visit: Payer: Self-pay

## 2019-07-27 DIAGNOSIS — I6523 Occlusion and stenosis of bilateral carotid arteries: Secondary | ICD-10-CM

## 2019-07-28 ENCOUNTER — Telehealth: Payer: Self-pay | Admitting: Physician Assistant

## 2019-07-28 NOTE — Telephone Encounter (Signed)
Transferred call to [facility]

## 2019-08-16 DIAGNOSIS — E538 Deficiency of other specified B group vitamins: Secondary | ICD-10-CM | POA: Diagnosis not present

## 2019-08-16 DIAGNOSIS — Z0000 Encounter for general adult medical examination without abnormal findings: Secondary | ICD-10-CM | POA: Diagnosis not present

## 2019-08-16 DIAGNOSIS — E7800 Pure hypercholesterolemia, unspecified: Secondary | ICD-10-CM | POA: Diagnosis not present

## 2019-08-16 DIAGNOSIS — E1151 Type 2 diabetes mellitus with diabetic peripheral angiopathy without gangrene: Secondary | ICD-10-CM | POA: Diagnosis not present

## 2019-08-23 DIAGNOSIS — E538 Deficiency of other specified B group vitamins: Secondary | ICD-10-CM | POA: Diagnosis not present

## 2019-08-23 DIAGNOSIS — Z0000 Encounter for general adult medical examination without abnormal findings: Secondary | ICD-10-CM | POA: Diagnosis not present

## 2019-08-23 DIAGNOSIS — Z9861 Coronary angioplasty status: Secondary | ICD-10-CM | POA: Diagnosis not present

## 2019-08-23 DIAGNOSIS — R82998 Other abnormal findings in urine: Secondary | ICD-10-CM | POA: Diagnosis not present

## 2019-08-23 DIAGNOSIS — D692 Other nonthrombocytopenic purpura: Secondary | ICD-10-CM | POA: Diagnosis not present

## 2019-08-23 DIAGNOSIS — E7800 Pure hypercholesterolemia, unspecified: Secondary | ICD-10-CM | POA: Diagnosis not present

## 2019-08-23 DIAGNOSIS — I25118 Atherosclerotic heart disease of native coronary artery with other forms of angina pectoris: Secondary | ICD-10-CM | POA: Diagnosis not present

## 2019-08-23 DIAGNOSIS — I119 Hypertensive heart disease without heart failure: Secondary | ICD-10-CM | POA: Diagnosis not present

## 2019-08-23 DIAGNOSIS — E1151 Type 2 diabetes mellitus with diabetic peripheral angiopathy without gangrene: Secondary | ICD-10-CM | POA: Diagnosis not present

## 2019-08-23 DIAGNOSIS — I6529 Occlusion and stenosis of unspecified carotid artery: Secondary | ICD-10-CM | POA: Diagnosis not present

## 2019-08-23 DIAGNOSIS — I1310 Hypertensive heart and chronic kidney disease without heart failure, with stage 1 through stage 4 chronic kidney disease, or unspecified chronic kidney disease: Secondary | ICD-10-CM | POA: Diagnosis not present

## 2020-01-26 DIAGNOSIS — Z08 Encounter for follow-up examination after completed treatment for malignant neoplasm: Secondary | ICD-10-CM | POA: Diagnosis not present

## 2020-01-26 DIAGNOSIS — Z8546 Personal history of malignant neoplasm of prostate: Secondary | ICD-10-CM | POA: Diagnosis not present

## 2020-01-26 DIAGNOSIS — C61 Malignant neoplasm of prostate: Secondary | ICD-10-CM | POA: Diagnosis not present

## 2020-01-26 DIAGNOSIS — Z923 Personal history of irradiation: Secondary | ICD-10-CM | POA: Diagnosis not present

## 2020-02-25 DIAGNOSIS — E538 Deficiency of other specified B group vitamins: Secondary | ICD-10-CM | POA: Diagnosis not present

## 2020-02-25 DIAGNOSIS — K219 Gastro-esophageal reflux disease without esophagitis: Secondary | ICD-10-CM | POA: Diagnosis not present

## 2020-02-25 DIAGNOSIS — E1151 Type 2 diabetes mellitus with diabetic peripheral angiopathy without gangrene: Secondary | ICD-10-CM | POA: Diagnosis not present

## 2020-02-25 DIAGNOSIS — I119 Hypertensive heart disease without heart failure: Secondary | ICD-10-CM | POA: Diagnosis not present

## 2020-02-25 DIAGNOSIS — G5601 Carpal tunnel syndrome, right upper limb: Secondary | ICD-10-CM | POA: Diagnosis not present

## 2020-02-25 DIAGNOSIS — I6529 Occlusion and stenosis of unspecified carotid artery: Secondary | ICD-10-CM | POA: Diagnosis not present

## 2020-02-25 DIAGNOSIS — I25118 Atherosclerotic heart disease of native coronary artery with other forms of angina pectoris: Secondary | ICD-10-CM | POA: Diagnosis not present

## 2020-02-25 DIAGNOSIS — C61 Malignant neoplasm of prostate: Secondary | ICD-10-CM | POA: Diagnosis not present

## 2020-02-25 DIAGNOSIS — Z9861 Coronary angioplasty status: Secondary | ICD-10-CM | POA: Diagnosis not present

## 2020-02-25 DIAGNOSIS — E7800 Pure hypercholesterolemia, unspecified: Secondary | ICD-10-CM | POA: Diagnosis not present

## 2020-03-07 DIAGNOSIS — H26493 Other secondary cataract, bilateral: Secondary | ICD-10-CM | POA: Diagnosis not present

## 2020-03-07 DIAGNOSIS — H04123 Dry eye syndrome of bilateral lacrimal glands: Secondary | ICD-10-CM | POA: Diagnosis not present

## 2020-03-07 DIAGNOSIS — Z961 Presence of intraocular lens: Secondary | ICD-10-CM | POA: Diagnosis not present

## 2020-03-07 DIAGNOSIS — H40013 Open angle with borderline findings, low risk, bilateral: Secondary | ICD-10-CM | POA: Diagnosis not present

## 2020-03-14 ENCOUNTER — Telehealth: Payer: Self-pay | Admitting: *Deleted

## 2020-03-14 DIAGNOSIS — G5603 Carpal tunnel syndrome, bilateral upper limbs: Secondary | ICD-10-CM | POA: Diagnosis not present

## 2020-03-14 DIAGNOSIS — G5621 Lesion of ulnar nerve, right upper limb: Secondary | ICD-10-CM | POA: Diagnosis not present

## 2020-03-14 DIAGNOSIS — M1812 Unilateral primary osteoarthritis of first carpometacarpal joint, left hand: Secondary | ICD-10-CM | POA: Diagnosis not present

## 2020-03-14 NOTE — Telephone Encounter (Signed)
Left message for the pt to call the office to set up sooner appt for pre op clearance per pre op provider. I did not see anything available at the [facility] location. I did review [facility] schedule as well and at this point I believe we will have to take a 72 hour time slot with and APP so that the pt's surgery is not delayed. Left message to call and schedule appt.

## 2020-03-14 NOTE — Telephone Encounter (Signed)
   Primary Cardiologist: Sherren Mocha, MD  Chart reviewed as part of pre-operative protocol coverage.  Last OV 04/2019.  Because of [name] Ray's past medical history and time since last visit, he will require a follow-up visit in order to better assess preoperative cardiovascular risk.  Pre-op covering staff: - Please schedule appointment and call patient to inform them. If patient already had an upcoming appointment within acceptable timeframe, please add "pre-op clearance" to the appointment notes so provider is aware. - Please contact requesting surgeon's office via preferred method (i.e, phone, fax) to inform them of need for appointment prior to surgery.   Richardson Dopp, PA-C  03/14/2020, 3:11 PM

## 2020-03-14 NOTE — Telephone Encounter (Signed)
   [facility] Pre-operative Risk Assessment    HEARTCARE STAFF: - Please ensure there is not already an duplicate clearance open for this procedure. - Under Visit Info/Reason for Call, type in Other and utilize the format Clearance MM/DD/YY or Clearance TBD. Do not use dashes or single digits. - If request is for dental extraction, please clarify the # of teeth to be extracted.  Request for surgical clearance:  1. What type of surgery is being performed? RIGHT CARPAL TUNNEL RELEASE, RIGHT CUBITAL TUNNEL RELEASE   2. When is this surgery scheduled? 04/05/20   3. What type of clearance is required (medical clearance vs. Pharmacy clearance to hold med vs. Both)? MEDICAL  4. Are there any medications that need to be held prior to surgery and how long? PLAVIX    5. Practice name and name of physician performing surgery? [facility]; DR. Charlotte Crumb   6. What is the office phone number? [phone]   7.   What is the office fax number? [phone] ATTN: RHONDA  8.   Anesthesia type (None, local, MAC, general) ? AXILLARY BLOCK/MAC   Julaine Hua 03/14/2020, 2:58 PM  _________________________________________________________________   (provider comments below)

## 2020-03-14 NOTE — Telephone Encounter (Signed)
Pt has been scheduled to see Coletta Memos, FNP 03/21/20 for pre op clearance. I will forward notes to FNP for upcoming appt. Will send FYI to requesting office pt has appt 03/21/20.

## 2020-03-19 NOTE — Progress Notes (Unsigned)
Cardiology Clinic Note   Patient Name: [name] Ray Date of Encounter: 03/21/2020  Primary Care Provider:  Haywood Pao, MD Primary Cardiologist:  Sherren Mocha, MD  Patient Profile    [name] Ray 87 year old male presents to the clinic today for follow-up evaluation of his coronary artery disease and preoperative cardiac evaluation.  Past Medical History    Past Medical History:  Diagnosis Date  . Adenomatous polyp    history  . Arthritis    knees , shoulders, hands   . Atrial bigeminy    in the past  . BPH (benign prostatic hyperplasia)   . Carotid artery disease ([place])    a. Doppler, January, 2012, 40-59% bilateral stenoses;  b.  Carotids 4/14:  40-59% bilat ICA, occluded R vertebral artery; f/u 1 year // Carotid US 07/2018:  Bilat ICA 1-39; R subclavian stenosis >> rpt in 1 year  // Carotid US 07/2019: Bilateral ICA 40-59; right subclavian stenosis; repeat 1 year   . Colon polyps   . Coronary artery disease    acute inferior MI Jan 2002 with stent / Taxus stent LAD 2004 / Cypher stent prox LAD and cypher stent mid circ May 2006 / nuclear stress Dec 2009 normal EF 63% / nuclear August 2011 EF 62% / inferior thining and very mild ischemia inferior and apex / catherization Sept 2011 EF 60%  mild to moderate diffuse RCA disease, moderate discrete LAD stenosis before the stent in the proximal LAD med tx rec  . Diverticulosis   . Dyslipidemia   . Ejection fraction   . Fatty liver    history  . GERD (gastroesophageal reflux disease)   . Hypertension    [facility] - 5 stents total , last cardiac cath. 2011.  Cleared for  surgery by Dr. [name]- 04/23/2013  . IBS (irritable bowel syndrome)   . Myocardial infarction ([place]) 2002  . Myoview    Myoview 07/2018: EF 63, inferior artifact, no ischemia or scar; Low Risk  . Numbness and tingling in hands    resolved  . Overweight(278.02)   . Prostate cancer ([place]) approx 2016   treated /w  Lupron injection x1, then radium   . Shortness of breath dyspnea   . Type II diabetes mellitus ([place])    Past Surgical History:  Procedure Laterality Date  . CARDIAC CATHETERIZATION     last cath- 2006  . COLONOSCOPY    . CORONARY ANGIOPLASTY WITH STENT PLACEMENT     multiple stents in LAD:  "I've got 5 stents" (05/24/2013)  . EYE SURGERY Bilateral    cataract surgery with lens implants  . LUMBAR LAMINECTOMY/DECOMPRESSION MICRODISCECTOMY N/A 09/11/2017   Procedure: LAMINECTOMY AND FORAMINOTOMY LUMBAR One-Two, Two-Three, Three-Four, Four-Five, Five-Sacral One;  Surgeon: Newman Pies, MD;  Location: [place];  Service: Neurosurgery;  Laterality: N/A;  LAMINECTOMY AND FORAMINOTOMY LUMBAR One-Two,Two-Three, Three-Four, Four-Five, Five-Sacral One  . NEUROPLASTY / TRANSPOSITION MEDIAN NERVE AT CARPAL TUNNEL Left    also surgery on thumb, elbow- nerve release   . PROSTATE BIOPSY     followed later by HDR  . TOTAL HIP ARTHROPLASTY Left 01/26/2018   Procedure: TOTAL HIP ARTHROPLASTY ANTERIOR APPROACH;  Surgeon: [name], MD;  Location: [facility];  Service: Orthopedics;  Laterality: Left;  166min  . TOTAL KNEE ARTHROPLASTY Right 05/24/2013  . TOTAL KNEE ARTHROPLASTY Right 05/24/2013   Procedure: RIGHT TOTAL KNEE ARTHROPLASTY;  Surgeon: [name], MD;  Location: [place];  Service: Orthopedics;  Laterality: Right;  .  TOTAL KNEE ARTHROPLASTY Left 02/14/2014   Procedure: LEFT TOTAL KNEE ARTHROPLASTY;  Surgeon: [name], MD;  Location: [place];  Service: Orthopedics;  Laterality: Left;    Allergies  Allergies  Allergen Reactions  . Atorvastatin Other (See Comments)    SEVERE LEG PAIN Can not walk Sever leg pain Can not walk  . Rosuvastatin Other (See Comments)    JOINT PAIN  Makes knees hurt  REACTION: unknown Makes knees hurt   . Clindamycin Rash    PATIENT REFUSES    History of Present Illness    [name] Ray is a PMH of coronary artery disease (status post inferior MI 2002 with PCI, status post DES to LAD in 2004,  status post DES to proximal LAD and Cypher DES to [facility] 5/06, and LHC in 2011 with patent stents.)  Diabetes mellitus, HTN, HLD, carotid artery disease, BPH, and prostate cancer status post radiation.  He was seen by Dr. Burt Knack on 05/10/2019.  He was noted to have carpal tunnel probably superimposed.  Who developed progressive right hand weakness and pain.  He requested carpal tunnel surgery.  He reported rare chest discomfort and indicated that he had not taken nitroglycerin in a long time.  He could not remember the last time he took it.  He was staying active mowing his 1.5 acre lawn.  He reported he would occasionally have lightheadedness but had not had any falls in the last year.  He denied palpitations, shortness of breath, PND, orthopnea, and lower extremity swelling.  He presents to the clinic today for follow-up evaluation and preoperative cardiac evaluation.  He states he would like to go ahead with his right carpal tunnel repair.  He remains physically active managing his property, mowing, and working on his wife's raised garden beds.  He reports he continues to follow a low sugar low-salt diet.  I will give him the salty 6 diet sheet, have him maintain his physical activity, and follow-up in 12 months.  Today he denies chest pain, shortness of breath, increased lower extremity edema, fatigue, and palpitations.   Home Medications    Prior to Admission medications   Medication Sig Start Date End Date Taking? Authorizing Provider  acetaminophen (TYLENOL) 500 MG tablet Take 1 tablet (500 mg total) by mouth every 6 (six) hours as needed for mild pain. 07/23/18   Richardson Dopp T, PA-C  amLODipine (NORVASC) 5 MG tablet Take 1 tablet (5 mg total) by mouth daily. 01/22/19   Richardson Dopp T, PA-C  amoxicillin (AMOXIL) 500 MG capsule Take 2,000 mg by mouth See admin instructions. Take 4 capsules (2000 mg) by mouth 1 prior to dental appointments.    [provider]  clopidogrel (PLAVIX) 75 MG  tablet Take 75 mg by mouth daily.     [provider]  Guaifenesin (MUCINEX MAXIMUM STRENGTH) 1200 MG TB12 Take by mouth daily.    [provider]  JENTADUETO XR 2.05-998 MG TB24 Take 1 tablet by mouth 2 (two) times daily. 02/12/18   [provider]  loperamide (IMODIUM A-D) 2 MG tablet Take 2 mg by mouth 4 (four) times daily as needed for diarrhea or loose stools.    [provider]  loratadine (CLARITIN) 10 MG tablet Take 10 mg by mouth daily as needed for allergies.     [provider]  nitroGLYCERIN (NITROSTAT) 0.4 MG SL tablet Place 1 tablet (0.4 mg total) under the tongue every 5 (five) minutes as needed for chest pain. Please keep upcoming  appt. Thank you 04/23/17   Sherren Mocha, MD  omeprazole (PRILOSEC) 20 MG capsule Take 20 mg by mouth daily.     [provider]  ramipril (ALTACE) 10 MG capsule Take 10 mg by mouth daily after breakfast.     [provider]  simvastatin (ZOCOR) 20 MG tablet Take 20 mg by mouth at bedtime.      [provider]  tamsulosin (FLOMAX) 0.4 MG CAPS capsule Take 0.4 mg by mouth every other day.  04/01/13   [provider]    Family History    Family History  Problem Relation Age of Onset  . Hypertension Father   . Heart disease Father   . Heart disease Brother   . Heart attack Paternal Grandfather    He indicated that his mother is deceased. He indicated that his father is deceased. He indicated that his brother is deceased. He indicated that his maternal grandmother is deceased. He indicated that his maternal grandfather is deceased. He indicated that his paternal grandmother is deceased. He indicated that his paternal grandfather is deceased.  Social History    Social History   Socioeconomic History  . Marital status: Married    Spouse name: Not on file  . Number of children: Not on file  . Years of education: Not on file  . Highest education level: Not on file   Occupational History  . Not on file  Tobacco Use  . Smoking status: Never Smoker  . Smokeless tobacco: Never Used  Vaping Use  . Vaping Use: Never used  Substance and Sexual Activity  . Alcohol use: No  . Drug use: No  . Sexual activity: Not Currently  Other Topics Concern  . Not on file  Social History Narrative  . Not on file   Social Determinants of Health   Financial Resource Strain: Not on file  Food Insecurity: Not on file  Transportation Needs: Not on file  Physical Activity: Not on file  Stress: Not on file  Social Connections: Not on file  Intimate Partner Violence: Not on file     Review of Systems    General:  No chills, fever, night sweats or weight changes.  Cardiovascular:  No chest pain, dyspnea on exertion, edema, orthopnea, palpitations, paroxysmal nocturnal dyspnea. Dermatological: No rash, lesions/masses Respiratory: No cough, dyspnea Urologic: No hematuria, dysuria Abdominal:   No nausea, vomiting, diarrhea, bright red blood per rectum, melena, or hematemesis Neurologic:  No visual changes, wkns, changes in mental status. All other systems reviewed and are otherwise negative except as noted above.  Physical Exam    VS:  BP (!) 132/59 (BP Location: Left Arm, Patient Position: Sitting, Cuff Size: Normal)   Pulse 67   Ht 5\' 5"  (1.651 m)   Wt 204 lb (92.5 kg)   BMI 33.95 kg/m  , BMI Body mass index is 33.95 kg/m. GEN: Well nourished, well developed, in no acute distress. HEENT: normal. Neck: Supple, no JVD, carotid bruits, or masses. Cardiac: RRR, no murmurs, rubs, or gallops. No clubbing, cyanosis, generalized nonpitting bilateral lower extremity edema right greater than left.  Radials/DP/PT 2+ and equal bilaterally.  Respiratory:  Respirations regular and unlabored, clear to auscultation bilaterally. GI: Soft, nontender, nondistended, BS + x 4. MS: no deformity or atrophy. Skin: warm and dry, no rash. Neuro:  Strength and sensation are  intact. Psych: Normal affect.  Accessory Clinical Findings    Recent Labs: No results found for requested labs within last 8760 hours.  Recent Lipid Panel No results found for: CHOL, TRIG, HDL, CHOLHDL, VLDL, LDLCALC, LDLDIRECT  ECG personally reviewed by me today-sinus rhythm with premature atrial complexes 66 bpm  Nuclear stress test 07/22/18  Nuclear stress EF: 63%. The left ventricular ejection fraction is normal (55-65%).  There was no ST segment deviation noted during stress.  Defect 1: There is a small defect of mild severity present in the basal inferior and mid inferior location. This is consistent with artifart.  This is a low risk study. No evidence of ischemia or infarction  The study is normal.    Assessment & Plan   1.  Coronary artery disease-no chest pain today.  EKG shows sinus rhythm with premature atrial complexes 66 bpm.  Underwent LHC 2011 which showed patent stents.  Continues to remain physically active. Continue amlodipine, Plavix, nitroglycerin Heart healthy low-sodium diet-salty 6 given Increase physical activity as tolerated  Essential hypertension-BP today 132/59.  Well-controlled at home. Continue amlodipine Heart healthy low-sodium diet-salty 6 given Increase physical activity as tolerated  Hyperlipidemia-51 on 8/2/21on statin therapy Continue simvastatin Heart healthy low-sodium high-fiber diet  Preoperative cardiac evaluation-carpal tunnel release right 04/05/2020 Dr. Rodman Key [phone]     Primary Cardiologist: Sherren Mocha, MD  Chart reviewed as part of pre-operative protocol coverage. Given past medical history and time since last visit, based on ACC/AHA guidelines, BRAHIM Ray would be at acceptable risk for the planned procedure without further cardiovascular testing.   His RCRI is a class I risk, 0.4% risk of major cardiac event.  He is able to complete greater than 4 METS of physical activity.  Patient was  advised that if he develops new symptoms prior to surgery to contact our office to arrange a follow-up appointment.  He verbalized understanding.  I will route this recommendation to the requesting party via Epic fax function and remove from pre-op pool.  Please call with questions.   Disposition: Follow-up with Dr. Burt Knack in 12 months.   [name] Ng. Cleaver NP-C    03/21/2020, 9:21 AM [address] Office [phone] Fax [phone]  Notice: This dictation was prepared with Dragon dictation along with smaller phrase technology. Any transcriptional errors that result from this process are unintentional and may not be corrected upon review.  I spent 15 minutes examining this patient, reviewing medications, and using patient centered shared decision making involving her cardiac care.  Prior to her visit I spent greater than 20 minutes reviewing her past medical history,  medications, and prior cardiac tests.

## 2020-03-21 ENCOUNTER — Encounter: Payer: Self-pay | Admitting: General Practice

## 2020-03-21 ENCOUNTER — Other Ambulatory Visit: Payer: Self-pay

## 2020-03-21 ENCOUNTER — Ambulatory Visit: Payer: Medicare PPO | Admitting: General Practice

## 2020-03-21 VITALS — BP 132/59 | HR 67 | Ht 65.0 in | Wt 204.0 lb

## 2020-03-21 DIAGNOSIS — Z01810 Encounter for preprocedural cardiovascular examination: Secondary | ICD-10-CM | POA: Diagnosis not present

## 2020-03-21 DIAGNOSIS — E782 Mixed hyperlipidemia: Secondary | ICD-10-CM | POA: Diagnosis not present

## 2020-03-21 DIAGNOSIS — I25119 Atherosclerotic heart disease of native coronary artery with unspecified angina pectoris: Secondary | ICD-10-CM | POA: Diagnosis not present

## 2020-03-21 DIAGNOSIS — I10 Essential (primary) hypertension: Secondary | ICD-10-CM

## 2020-03-21 NOTE — Patient Instructions (Signed)
Medication Instructions:  The current medical regimen is effective;  continue present plan and medications as directed. Please refer to the Current Medication list given to you today.  *If you need a refill on your cardiac medications before your next appointment, please call your pharmacy*  Lab Work:   Testing/Procedures:  NONE    NONE  Special Instructions CLEARED FOR SURGERY-WE HAVE NOTIFIED YOUR SURGEON   PLEASE READ AND FOLLOW SALTY 6-ATTACHED-1,800mg  daily  PLEASE MAINTAIN PHYSICAL ACTIVITY AS TOLERATED  Follow-Up: Your next appointment:  12 month(s) In Person with You may see Sherren Mocha, MD or one of the following Advanced Practice Providers on your designated Care Team:  Richardson Dopp, PA-C  Robbie Lis, PA-C  Please call our office 2 months in advance to schedule this appointment   At [facility], you and your health needs are our priority.  As part of our continuing mission to provide you with exceptional heart care, we have created designated Provider Care Teams.  These Care Teams include your primary Cardiologist (physician) and Advanced Practice Providers (APPs -  Physician Assistants and Nurse Practitioners) who all work together to provide you with the care you need, when you need it.            6 SALTY THINGS TO AVOID     1,800MG  DAILY

## 2020-04-05 DIAGNOSIS — G5621 Lesion of ulnar nerve, right upper limb: Secondary | ICD-10-CM | POA: Diagnosis not present

## 2020-04-05 DIAGNOSIS — G8918 Other acute postprocedural pain: Secondary | ICD-10-CM | POA: Diagnosis not present

## 2020-04-05 DIAGNOSIS — G5601 Carpal tunnel syndrome, right upper limb: Secondary | ICD-10-CM | POA: Diagnosis not present

## 2020-05-09 ENCOUNTER — Ambulatory Visit: Payer: Medicare PPO | Admitting: Physician Assistant

## 2020-07-03 ENCOUNTER — Emergency Department (HOSPITAL_COMMUNITY): Payer: Medicare PPO

## 2020-07-03 ENCOUNTER — Other Ambulatory Visit: Payer: Self-pay

## 2020-07-03 ENCOUNTER — Inpatient Hospital Stay (HOSPITAL_COMMUNITY)
Admission: EM | Admit: 2020-07-03 | Discharge: 2020-07-06 | DRG: 303 | Disposition: A | Discharge status: Home / Self Care | Payer: Medicare PPO | Attending: Internal Medicine | Admitting: Internal Medicine

## 2020-07-03 ENCOUNTER — Encounter (HOSPITAL_COMMUNITY): Payer: Self-pay | Admitting: Emergency Medicine

## 2020-07-03 DIAGNOSIS — R7989 Other specified abnormal findings of blood chemistry: Secondary | ICD-10-CM | POA: Diagnosis not present

## 2020-07-03 DIAGNOSIS — Z961 Presence of intraocular lens: Secondary | ICD-10-CM | POA: Diagnosis present

## 2020-07-03 DIAGNOSIS — Z20822 Contact with and (suspected) exposure to covid-19: Secondary | ICD-10-CM | POA: Diagnosis present

## 2020-07-03 DIAGNOSIS — Z9842 Cataract extraction status, left eye: Secondary | ICD-10-CM | POA: Diagnosis not present

## 2020-07-03 DIAGNOSIS — Z6831 Body mass index (BMI) 31.0-31.9, adult: Secondary | ICD-10-CM

## 2020-07-03 DIAGNOSIS — R6510 Systemic inflammatory response syndrome (SIRS) of non-infectious origin without acute organ dysfunction: Secondary | ICD-10-CM | POA: Diagnosis not present

## 2020-07-03 DIAGNOSIS — N401 Enlarged prostate with lower urinary tract symptoms: Secondary | ICD-10-CM | POA: Diagnosis not present

## 2020-07-03 DIAGNOSIS — N289 Disorder of kidney and ureter, unspecified: Secondary | ICD-10-CM

## 2020-07-03 DIAGNOSIS — E1122 Type 2 diabetes mellitus with diabetic chronic kidney disease: Secondary | ICD-10-CM | POA: Diagnosis present

## 2020-07-03 DIAGNOSIS — M47814 Spondylosis without myelopathy or radiculopathy, thoracic region: Secondary | ICD-10-CM | POA: Diagnosis not present

## 2020-07-03 DIAGNOSIS — E1169 Type 2 diabetes mellitus with other specified complication: Secondary | ICD-10-CM | POA: Diagnosis present

## 2020-07-03 DIAGNOSIS — K589 Irritable bowel syndrome without diarrhea: Secondary | ICD-10-CM | POA: Diagnosis present

## 2020-07-03 DIAGNOSIS — I700 Atherosclerosis of aorta: Secondary | ICD-10-CM | POA: Diagnosis not present

## 2020-07-03 DIAGNOSIS — E871 Hypo-osmolality and hyponatremia: Secondary | ICD-10-CM | POA: Diagnosis present

## 2020-07-03 DIAGNOSIS — K30 Functional dyspepsia: Secondary | ICD-10-CM | POA: Diagnosis present

## 2020-07-03 DIAGNOSIS — I2510 Atherosclerotic heart disease of native coronary artery without angina pectoris: Secondary | ICD-10-CM | POA: Diagnosis not present

## 2020-07-03 DIAGNOSIS — Z8546 Personal history of malignant neoplasm of prostate: Secondary | ICD-10-CM | POA: Diagnosis not present

## 2020-07-03 DIAGNOSIS — Z955 Presence of coronary angioplasty implant and graft: Secondary | ICD-10-CM

## 2020-07-03 DIAGNOSIS — Z86010 Personal history of colonic polyps: Secondary | ICD-10-CM

## 2020-07-03 DIAGNOSIS — E785 Hyperlipidemia, unspecified: Secondary | ICD-10-CM | POA: Diagnosis present

## 2020-07-03 DIAGNOSIS — Z888 Allergy status to other drugs, medicaments and biological substances status: Secondary | ICD-10-CM

## 2020-07-03 DIAGNOSIS — I252 Old myocardial infarction: Secondary | ICD-10-CM | POA: Diagnosis not present

## 2020-07-03 DIAGNOSIS — R42 Dizziness and giddiness: Secondary | ICD-10-CM

## 2020-07-03 DIAGNOSIS — N400 Enlarged prostate without lower urinary tract symptoms: Secondary | ICD-10-CM | POA: Diagnosis present

## 2020-07-03 DIAGNOSIS — Z9841 Cataract extraction status, right eye: Secondary | ICD-10-CM

## 2020-07-03 DIAGNOSIS — R0602 Shortness of breath: Secondary | ICD-10-CM | POA: Diagnosis not present

## 2020-07-03 DIAGNOSIS — I10 Essential (primary) hypertension: Secondary | ICD-10-CM | POA: Diagnosis present

## 2020-07-03 DIAGNOSIS — R0789 Other chest pain: Secondary | ICD-10-CM | POA: Diagnosis not present

## 2020-07-03 DIAGNOSIS — Z96642 Presence of left artificial hip joint: Secondary | ICD-10-CM | POA: Diagnosis present

## 2020-07-03 DIAGNOSIS — N182 Chronic kidney disease, stage 2 (mild): Secondary | ICD-10-CM | POA: Diagnosis present

## 2020-07-03 DIAGNOSIS — D61818 Other pancytopenia: Secondary | ICD-10-CM

## 2020-07-03 DIAGNOSIS — I129 Hypertensive chronic kidney disease with stage 1 through stage 4 chronic kidney disease, or unspecified chronic kidney disease: Secondary | ICD-10-CM | POA: Diagnosis present

## 2020-07-03 DIAGNOSIS — W57XXXA Bitten or stung by nonvenomous insect and other nonvenomous arthropods, initial encounter: Secondary | ICD-10-CM

## 2020-07-03 DIAGNOSIS — Z96653 Presence of artificial knee joint, bilateral: Secondary | ICD-10-CM | POA: Diagnosis present

## 2020-07-03 DIAGNOSIS — E669 Obesity, unspecified: Secondary | ICD-10-CM | POA: Diagnosis present

## 2020-07-03 DIAGNOSIS — K219 Gastro-esophageal reflux disease without esophagitis: Secondary | ICD-10-CM | POA: Diagnosis present

## 2020-07-03 DIAGNOSIS — R079 Chest pain, unspecified: Secondary | ICD-10-CM | POA: Diagnosis present

## 2020-07-03 DIAGNOSIS — Z881 Allergy status to other antibiotic agents status: Secondary | ICD-10-CM

## 2020-07-03 DIAGNOSIS — Z8249 Family history of ischemic heart disease and other diseases of the circulatory system: Secondary | ICD-10-CM

## 2020-07-03 DIAGNOSIS — E119 Type 2 diabetes mellitus without complications: Secondary | ICD-10-CM | POA: Diagnosis present

## 2020-07-03 DIAGNOSIS — Z79899 Other long term (current) drug therapy: Secondary | ICD-10-CM

## 2020-07-03 DIAGNOSIS — Z7902 Long term (current) use of antithrombotics/antiplatelets: Secondary | ICD-10-CM

## 2020-07-03 DIAGNOSIS — W57XXXD Bitten or stung by nonvenomous insect and other nonvenomous arthropods, subsequent encounter: Secondary | ICD-10-CM | POA: Diagnosis not present

## 2020-07-03 DIAGNOSIS — R197 Diarrhea, unspecified: Secondary | ICD-10-CM | POA: Diagnosis not present

## 2020-07-03 LAB — COMPREHENSIVE METABOLIC PANEL
ALT: 30 U/L (ref 0–44)
AST: 36 U/L (ref 15–41)
Albumin: 3.4 g/dL — ABNORMAL LOW (ref 3.5–5.0)
Alkaline Phosphatase: 53 U/L (ref 38–126)
Anion gap: 7 (ref 5–15)
BUN: 24 mg/dL — ABNORMAL HIGH (ref 8–23)
CO2: 25 mmol/L (ref 22–32)
Calcium: 8.6 mg/dL — ABNORMAL LOW (ref 8.9–10.3)
Chloride: 102 mmol/L (ref 98–111)
Creatinine, Ser: 1.28 mg/dL — ABNORMAL HIGH (ref 0.61–1.24)
GFR, Estimated: 55 mL/min — ABNORMAL LOW (ref >60)
Glucose, Bld: 163 mg/dL — ABNORMAL HIGH (ref 70–99)
Potassium: 4.8 mmol/L (ref 3.5–5.1)
Sodium: 134 mmol/L — ABNORMAL LOW (ref 135–145)
Total Bilirubin: 0.5 mg/dL (ref 0.3–1.2)
Total Protein: 5.9 g/dL — ABNORMAL LOW (ref 6.5–8.1)

## 2020-07-03 LAB — URINALYSIS, ROUTINE W REFLEX MICROSCOPIC
Bilirubin Urine: NEGATIVE
Glucose, UA: NEGATIVE mg/dL
Hgb urine dipstick: NEGATIVE
Ketones, ur: NEGATIVE mg/dL
Leukocytes,Ua: NEGATIVE
Nitrite: NEGATIVE
Protein, ur: 30 mg/dL — AB
Specific Gravity, Urine: 1.028 (ref 1.005–1.030)
pH: 5 (ref 5.0–8.0)

## 2020-07-03 LAB — CBC WITH DIFFERENTIAL/PLATELET
Abs Immature Granulocytes: 0.02 10*3/uL (ref 0.00–0.07)
Basophils Absolute: 0 10*3/uL (ref 0.0–0.1)
Basophils Relative: 1 %
Eosinophils Absolute: 0 10*3/uL (ref 0.0–0.5)
Eosinophils Relative: 0 %
HCT: 37.6 % — ABNORMAL LOW (ref 39.0–52.0)
Hemoglobin: 12.2 g/dL — ABNORMAL LOW (ref 13.0–17.0)
Immature Granulocytes: 1 %
Lymphocytes Relative: 4 %
Lymphs Abs: 0.1 10*3/uL — ABNORMAL LOW (ref 0.7–4.0)
MCH: 30.8 pg (ref 26.0–34.0)
MCHC: 32.4 g/dL (ref 30.0–36.0)
MCV: 94.9 fL (ref 80.0–100.0)
Monocytes Absolute: 0.2 10*3/uL (ref 0.1–1.0)
Monocytes Relative: 8 %
Neutro Abs: 2.4 10*3/uL (ref 1.7–7.7)
Neutrophils Relative %: 86 %
Platelets: 104 10*3/uL — ABNORMAL LOW (ref 150–400)
RBC: 3.96 MIL/uL — ABNORMAL LOW (ref 4.22–5.81)
RDW: 13.5 % (ref 11.5–15.5)
WBC: 2.8 10*3/uL — ABNORMAL LOW (ref 4.0–10.5)
nRBC: 0 % (ref 0.0–0.2)

## 2020-07-03 LAB — CK: Total CK: 79 U/L (ref 49–397)

## 2020-07-03 LAB — BRAIN NATRIURETIC PEPTIDE: B Natriuretic Peptide: 141.6 pg/mL — ABNORMAL HIGH (ref 0.0–100.0)

## 2020-07-03 LAB — LIPASE, BLOOD: Lipase: 36 U/L (ref 11–51)

## 2020-07-03 LAB — TROPONIN I (HIGH SENSITIVITY): Troponin I (High Sensitivity): 17 ng/L (ref <18)

## 2020-07-03 NOTE — ED Provider Notes (Signed)
Emergency Medicine Provider Triage Evaluation Note  [name] Ray , a 87 y.o. male  was evaluated in triage.  Pt complains of pain and cramping in his back and legs along with some chest pain and shortness of breath.  He reports compliance with all of his medications.  His legs are not any more swollen than usual.  He does have some chronic back pain.  He reports pain in his legs also..  Review of Systems  Positive: Short of breath, chest pain, chills Negative: Fevers  Physical Exam  BP (!) 124/49 (BP Location: Right Arm)   Pulse 80   Temp 100.2 F (37.9 C) (Oral)   Resp 18   SpO2 97%  Gen:   Awake, no distress   Resp:  Normal effort  MSK:   Moves extremities without difficulty  Other:  2+ bilateral DP pulses.  Medical Decision Making  Medically screening exam initiated at 7:53 PM.  Appropriate orders placed.  [name] Ray was informed that the remainder of the evaluation will be completed by another provider, this initial triage assessment does not replace that evaluation, and the importance of remaining in the ED until their evaluation is complete.  Note: Portions of this report may have been transcribed using voice recognition software. Every effort was made to ensure accuracy; however, inadvertent computerized transcription errors may be present    [name] 07/03/20 1954    Daleen Bo, MD 07/04/20 1118

## 2020-07-03 NOTE — ED Triage Notes (Signed)
Pt reports more dizziness than normal, with sob and generalized body aches that started yesterday. Pt reports leg cramps with standing.

## 2020-07-04 ENCOUNTER — Observation Stay (HOSPITAL_BASED_OUTPATIENT_CLINIC_OR_DEPARTMENT_OTHER): Payer: Medicare PPO

## 2020-07-04 ENCOUNTER — Observation Stay (HOSPITAL_COMMUNITY): Payer: Medicare PPO

## 2020-07-04 DIAGNOSIS — R079 Chest pain, unspecified: Secondary | ICD-10-CM | POA: Diagnosis not present

## 2020-07-04 DIAGNOSIS — I700 Atherosclerosis of aorta: Secondary | ICD-10-CM | POA: Diagnosis not present

## 2020-07-04 DIAGNOSIS — I2510 Atherosclerotic heart disease of native coronary artery without angina pectoris: Secondary | ICD-10-CM | POA: Diagnosis not present

## 2020-07-04 DIAGNOSIS — N401 Enlarged prostate with lower urinary tract symptoms: Secondary | ICD-10-CM | POA: Diagnosis not present

## 2020-07-04 DIAGNOSIS — R0602 Shortness of breath: Secondary | ICD-10-CM | POA: Diagnosis not present

## 2020-07-04 DIAGNOSIS — E785 Hyperlipidemia, unspecified: Secondary | ICD-10-CM

## 2020-07-04 DIAGNOSIS — I10 Essential (primary) hypertension: Secondary | ICD-10-CM

## 2020-07-04 DIAGNOSIS — R6510 Systemic inflammatory response syndrome (SIRS) of non-infectious origin without acute organ dysfunction: Secondary | ICD-10-CM | POA: Diagnosis present

## 2020-07-04 DIAGNOSIS — N289 Disorder of kidney and ureter, unspecified: Secondary | ICD-10-CM

## 2020-07-04 DIAGNOSIS — R42 Dizziness and giddiness: Secondary | ICD-10-CM | POA: Diagnosis not present

## 2020-07-04 DIAGNOSIS — N400 Enlarged prostate without lower urinary tract symptoms: Secondary | ICD-10-CM | POA: Diagnosis present

## 2020-07-04 DIAGNOSIS — M47814 Spondylosis without myelopathy or radiculopathy, thoracic region: Secondary | ICD-10-CM | POA: Diagnosis not present

## 2020-07-04 DIAGNOSIS — E1169 Type 2 diabetes mellitus with other specified complication: Secondary | ICD-10-CM

## 2020-07-04 LAB — BASIC METABOLIC PANEL
Anion gap: 7 (ref 5–15)
BUN: 24 mg/dL — ABNORMAL HIGH (ref 8–23)
CO2: 23 mmol/L (ref 22–32)
Calcium: 8.4 mg/dL — ABNORMAL LOW (ref 8.9–10.3)
Chloride: 103 mmol/L (ref 98–111)
Creatinine, Ser: 1.14 mg/dL (ref 0.61–1.24)
GFR, Estimated: >60 mL/min (ref >60)
Glucose, Bld: 109 mg/dL — ABNORMAL HIGH (ref 70–99)
Potassium: 4.4 mmol/L (ref 3.5–5.1)
Sodium: 133 mmol/L — ABNORMAL LOW (ref 135–145)

## 2020-07-04 LAB — ECHOCARDIOGRAM COMPLETE
AR max vel: 1.91 cm2
AV Area VTI: 1.95 cm2
AV Area mean vel: 1.81 cm2
AV Mean grad: 7 mmHg
AV Peak grad: 10.8 mmHg
Ao pk vel: 1.64 m/s
Area-P 1/2: 2.66 cm2
Height: 66 in
S' Lateral: 2.8 cm
Weight: 3152 oz

## 2020-07-04 LAB — LIPID PANEL
Cholesterol: 122 mg/dL (ref 0–200)
HDL: 52 mg/dL (ref >40)
LDL Cholesterol: 51 mg/dL (ref 0–99)
Total CHOL/HDL Ratio: 2.3 RATIO
Triglycerides: 97 mg/dL (ref <150)
VLDL: 19 mg/dL (ref 0–40)

## 2020-07-04 LAB — D-DIMER, QUANTITATIVE: D-Dimer, Quant: 3.37 ug/mL-FEU — ABNORMAL HIGH (ref 0.00–0.50)

## 2020-07-04 LAB — RESP PANEL BY RT-PCR (FLU A&B, COVID) ARPGX2
Influenza A by PCR: NEGATIVE
Influenza B by PCR: NEGATIVE
SARS Coronavirus 2 by RT PCR: NEGATIVE

## 2020-07-04 LAB — CBC
HCT: 33.1 % — ABNORMAL LOW (ref 39.0–52.0)
Hemoglobin: 11.3 g/dL — ABNORMAL LOW (ref 13.0–17.0)
MCH: 31.6 pg (ref 26.0–34.0)
MCHC: 34.1 g/dL (ref 30.0–36.0)
MCV: 92.5 fL (ref 80.0–100.0)
Platelets: 82 10*3/uL — ABNORMAL LOW (ref 150–400)
RBC: 3.58 MIL/uL — ABNORMAL LOW (ref 4.22–5.81)
RDW: 13.3 % (ref 11.5–15.5)
WBC: 2.4 10*3/uL — ABNORMAL LOW (ref 4.0–10.5)
nRBC: 0 % (ref 0.0–0.2)

## 2020-07-04 LAB — TROPONIN I (HIGH SENSITIVITY)
Troponin I (High Sensitivity): 19 ng/L — ABNORMAL HIGH (ref <18)
Troponin I (High Sensitivity): 20 ng/L — ABNORMAL HIGH (ref <18)

## 2020-07-04 MED ORDER — RAMIPRIL 10 MG PO CAPS
10.0000 mg | ORAL_CAPSULE | Freq: Every day | ORAL | Status: DC
  Administered 2020-07-05 – 2020-07-06 (×2): 10 mg via ORAL
  Filled 2020-07-04 (×2): qty 1

## 2020-07-04 MED ORDER — CLOPIDOGREL BISULFATE 75 MG PO TABS
75.0000 mg | ORAL_TABLET | Freq: Every day | ORAL | Status: DC
  Administered 2020-07-04 – 2020-07-06 (×3): 75 mg via ORAL
  Filled 2020-07-04 (×3): qty 1

## 2020-07-04 MED ORDER — ONDANSETRON HCL 4 MG/2ML IJ SOLN: 4.0000 mg | Freq: Four times a day (QID) | INTRAMUSCULAR | Status: DC | PRN

## 2020-07-04 MED ORDER — LOPERAMIDE HCL 2 MG PO CAPS: 2.0000 mg | ORAL_CAPSULE | Freq: Four times a day (QID) | ORAL | Status: DC | PRN

## 2020-07-04 MED ORDER — ACETAMINOPHEN 500 MG PO TABS
1000.0000 mg | ORAL_TABLET | Freq: Once | ORAL | Status: AC
  Administered 2020-07-04: 1000 mg via ORAL
  Filled 2020-07-04: qty 2

## 2020-07-04 MED ORDER — AMLODIPINE BESYLATE 5 MG PO TABS
5.0000 mg | ORAL_TABLET | Freq: Every day | ORAL | Status: DC
  Administered 2020-07-04 – 2020-07-06 (×3): 5 mg via ORAL
  Filled 2020-07-04 (×3): qty 1

## 2020-07-04 MED ORDER — NITROGLYCERIN 0.4 MG SL SUBL: 0.4000 mg | SUBLINGUAL_TABLET | SUBLINGUAL | Status: DC | PRN

## 2020-07-04 MED ORDER — SODIUM CHLORIDE 0.9 % IV BOLUS
500.0000 mL | Freq: Once | INTRAVENOUS | Status: AC
  Administered 2020-07-04: 500 mL via INTRAVENOUS

## 2020-07-04 MED ORDER — PANTOPRAZOLE SODIUM 40 MG PO TBEC
40.0000 mg | DELAYED_RELEASE_TABLET | Freq: Every day | ORAL | Status: DC
  Administered 2020-07-04 – 2020-07-06 (×3): 40 mg via ORAL
  Filled 2020-07-04 (×3): qty 1

## 2020-07-04 MED ORDER — SIMVASTATIN 20 MG PO TABS
20.0000 mg | ORAL_TABLET | Freq: Every day | ORAL | Status: DC
  Administered 2020-07-04 – 2020-07-05 (×2): 20 mg via ORAL
  Filled 2020-07-04 (×2): qty 1

## 2020-07-04 MED ORDER — IOHEXOL 350 MG/ML SOLN
75.0000 mL | Freq: Once | INTRAVENOUS | Status: AC | PRN
  Administered 2020-07-04: 75 mL via INTRAVENOUS

## 2020-07-04 MED ORDER — ACETAMINOPHEN 325 MG PO TABS
650.0000 mg | ORAL_TABLET | ORAL | Status: DC | PRN
  Administered 2020-07-04 – 2020-07-06 (×2): 650 mg via ORAL
  Filled 2020-07-04 (×4): qty 2

## 2020-07-04 MED ORDER — ENOXAPARIN SODIUM 40 MG/0.4ML IJ SOSY
40.0000 mg | PREFILLED_SYRINGE | INTRAMUSCULAR | Status: DC
  Administered 2020-07-04 – 2020-07-05 (×2): 40 mg via SUBCUTANEOUS
  Filled 2020-07-04 (×2): qty 0.4

## 2020-07-04 MED ORDER — TAMSULOSIN HCL 0.4 MG PO CAPS
0.4000 mg | ORAL_CAPSULE | Freq: Every day | ORAL | Status: DC
  Filled 2020-07-04: qty 1

## 2020-07-04 MED ORDER — ALUM & MAG HYDROXIDE-SIMETH 200-200-20 MG/5ML PO SUSP: 30.0000 mL | Freq: Four times a day (QID) | ORAL | Status: DC | PRN

## 2020-07-04 MED ORDER — LORATADINE 10 MG PO TABS: 10.0000 mg | ORAL_TABLET | Freq: Every day | ORAL | Status: DC | PRN

## 2020-07-04 NOTE — ED Notes (Signed)
Pt reports having increase in chest pain and back pain. Reports feeling dizzy when standing. Did repeat EKG. VSS. Reassessed and explained delay to patient.

## 2020-07-04 NOTE — Progress Notes (Signed)
Attempted lower extremity venous duplex, however patient is in radiology. Will attempt again as schedule permits.  07/04/2020 5:39 PM Kelby Aline., MHA, RVT, RDCS, RDMS

## 2020-07-04 NOTE — H&P (Addendum)
History and Physical    ESSA MALACHI TIR:[id number] DOB: August 18, 1933 DOA: 07/03/2020  Referring MD/NP/PA: Florian Buff, MD PCP: Haywood Pao, MD  Patient coming from: Home  Chief Complaint: Dizziness and chest pain  I have personally briefly reviewed patient's old medical records in [place]   HPI: BRACKEN MOFFA is a 87 y.o. male with medical history significant of hypertension, hyperlipidemia, CAD s/p stents, BPH presents with complaints of dizziness and chest pain.  Symptoms started 3 days ago with him feeling dizzy whenever he was standing up.  Patient reported having to hold onto things that felt like everything was moving and he was swimming had a period.  He also reported having some mid substernal chest discomfort that he reported felt possibly like heartburn.  However, symptoms did not improve with antacids.  He had reported associated symptoms of intermittent fever up to 100.7 F, chills, muscle cramps in his legs, malaise, and shortness of breath.  Denied having any nausea, vomiting, diarrhea, known recent sick contacts, or recent antibiotic use.  Due to his symptoms he gotten tested for COVID yesterday at [facility].  He has been vaccinated and boosted.  He reported that the chest discomfort that he had similar to previous heart attack that he had several years ago.  Patient reports that he had not issues with dizziness previously and was told to take Flomax every other day.  He had been doing so well up until 2 months ago when he started having issues urinating and restarted taking Flomax daily.  ED Course: On admission into the emergency department patient was noted to have a temperature of 100.2 F, respirations 17-23, pulse 57-85, and all other vital signs maintained.  EKG did not note any acute ischemic changes.  Labs from 6/20 significant for WBC 2.8, hemoglobin 12.2, platelets 104, sodium 134, BUN 24, creatinine 1.28, glucose 163 BNP 141.6, high-sensitivity troponin 17->  19.  Labs and COVID-19 screening were both negative.  Chest x-ray showed no acute abnormalities.  Review of Systems  Constitutional:  Positive for chills, fever and malaise/fatigue.  HENT:  Negative for ear discharge and nosebleeds.   Eyes:  Negative for photophobia and pain.  Respiratory:  Positive for shortness of breath.   Cardiovascular:  Positive for chest pain. Negative for leg swelling.  Gastrointestinal:  Negative for diarrhea, nausea and vomiting.  Genitourinary:  Negative for dysuria and hematuria.  Musculoskeletal:  Positive for myalgias.  Neurological:  Positive for dizziness. Negative for loss of consciousness and weakness.  Psychiatric/Behavioral:  Negative for substance abuse.    Past Medical History:  Diagnosis Date   Adenomatous polyp    history   Arthritis    knees , shoulders, hands    Atrial bigeminy    in the past   BPH (benign prostatic hyperplasia)    Carotid artery disease ([place])    a. Doppler, January, 2012, 40-59% bilateral stenoses;  b.  Carotids 4/14:  40-59% bilat ICA, occluded R vertebral artery; f/u 1 year // Carotid US 07/2018:  Bilat ICA 1-39; R subclavian stenosis >> rpt in 1 year  // Carotid US 07/2019: Bilateral ICA 40-59; right subclavian stenosis; repeat 1 year    Colon polyps    Coronary artery disease    acute inferior MI Jan 2002 with stent / Taxus stent LAD 2004 / Cypher stent prox LAD and cypher stent mid circ May 2006 / nuclear stress Dec 2009 normal EF 63% / nuclear August 2011 EF 62% / inferior  thining and very mild ischemia inferior and apex / catherization Sept 2011 EF 60%  mild to moderate diffuse RCA disease, moderate discrete LAD stenosis before the stent in the proximal LAD med tx rec   Diverticulosis    Dyslipidemia    Ejection fraction    Fatty liver    history   GERD (gastroesophageal reflux disease)    Hypertension    [facility] - 5 stents total , last cardiac cath. 2011.  Cleared for  surgery by Dr. [name]- 04/23/2013   IBS  (irritable bowel syndrome)    Myocardial infarction [facility]) 2002   Myoview    Myoview 07/2018: EF 63, inferior artifact, no ischemia or scar; Low Risk   Numbness and tingling in hands    resolved   Overweight(278.02)    Prostate cancer ([place]) approx 2016   treated /w  Lupron injection x1, then radium   Shortness of breath dyspnea    Type II diabetes mellitus ([place])     Past Surgical History:  Procedure Laterality Date   CARDIAC CATHETERIZATION     last cath- 2006   COLONOSCOPY     CORONARY ANGIOPLASTY WITH STENT PLACEMENT     multiple stents in LAD:  "I've got 5 stents" (05/24/2013)   EYE SURGERY Bilateral    cataract surgery with lens implants   LUMBAR LAMINECTOMY/DECOMPRESSION MICRODISCECTOMY N/A 09/11/2017   Procedure: LAMINECTOMY AND FORAMINOTOMY LUMBAR One-Two, Two-Three, Three-Four, Four-Five, Five-Sacral One;  Surgeon: Newman Pies, MD;  Location: [place] [facility];  Service: Neurosurgery;  Laterality: N/A;  LAMINECTOMY AND FORAMINOTOMY LUMBAR One-Two,Two-Three, Three-Four, Four-Five, Five-Sacral One   NEUROPLASTY / TRANSPOSITION MEDIAN NERVE AT CARPAL TUNNEL Left    also surgery on thumb, elbow- nerve release    PROSTATE BIOPSY     followed later by [facility]   TOTAL HIP ARTHROPLASTY Left 01/26/2018   Procedure: TOTAL HIP ARTHROPLASTY ANTERIOR APPROACH;  Surgeon: [name], MD;  Location: [facility];  Service: Orthopedics;  Laterality: Left;  15min   TOTAL KNEE ARTHROPLASTY Right 05/24/2013   TOTAL KNEE ARTHROPLASTY Right 05/24/2013   Procedure: RIGHT TOTAL KNEE ARTHROPLASTY;  Surgeon: [name], MD;  Location: [facility];  Service: Orthopedics;  Laterality: Right;   TOTAL KNEE ARTHROPLASTY Left 02/14/2014   Procedure: LEFT TOTAL KNEE ARTHROPLASTY;  Surgeon: [name], MD;  Location: [facility];  Service: Orthopedics;  Laterality: Left;     reports that he has never smoked. He has never used smokeless tobacco. He reports that he does not drink alcohol and does not use drugs.  Allergies  Allergen  Reactions   Atorvastatin Other (See Comments)    SEVERE LEG PAIN Can not walk Sever leg pain Can not walk   Rosuvastatin Other (See Comments)    JOINT PAIN  Makes knees hurt  REACTION: unknown Makes knees hurt    Clindamycin Rash    PATIENT REFUSES    Family History  Problem Relation Age of Onset   Hypertension Father    Heart disease Father    Heart disease Brother    Heart attack Paternal Grandfather     Prior to Admission medications   Medication Sig Start Date End Date Taking? Authorizing Provider  acetaminophen (TYLENOL) 500 MG tablet Take 1 tablet (500 mg total) by mouth every 6 (six) hours as needed for mild pain. 07/23/18  Yes Weaver, Scott T, PA-C  amLODipine (NORVASC) 5 MG tablet Take 1 tablet (5 mg total) by mouth daily. 01/22/19  Yes Weaver, Scott T, PA-C  amoxicillin (AMOXIL)  500 MG capsule Take 2,000 mg by mouth once as needed (before dental appointment).   Yes [provider]  clopidogrel (PLAVIX) 75 MG tablet Take 75 mg by mouth daily.   Yes [provider]  Guaifenesin 1200 MG TB12 Take 1,200 mg by mouth daily as needed (cough).   Yes [provider]  JENTADUETO XR 2.05-998 MG TB24 Take 1 tablet by mouth daily. 02/12/18  Yes [provider]  loperamide (IMODIUM A-D) 2 MG tablet Take 2 mg by mouth 4 (four) times daily as needed for diarrhea or loose stools.   Yes [provider]  loratadine (CLARITIN) 10 MG tablet Take 10 mg by mouth daily as needed for allergies.   Yes [provider]  Multiple Vitamin (MULTI-VITAMIN) tablet Take 1 tablet by mouth daily.   Yes [provider]  nitroGLYCERIN (NITROSTAT) 0.4 MG SL tablet Place 1 tablet (0.4 mg total) under the tongue every 5 (five) minutes as needed for chest pain. Please keep upcoming appt. Thank you 04/23/17  Yes Burt Knack, Legrand Como, MD  omeprazole (PRILOSEC) 20 MG capsule Take 20 mg by mouth daily.   Yes [provider]  ramipril (ALTACE) 10 MG capsule  Take 10 mg by mouth daily after breakfast.   Yes [provider]  simvastatin (ZOCOR) 20 MG tablet Take 20 mg by mouth at bedtime.   Yes [provider]  tamsulosin (FLOMAX) 0.4 MG CAPS capsule Take 0.4 mg by mouth at bedtime. 04/01/13  Yes [provider]    Physical Exam:  Constitutional: Elderly male currently in no acute distress v Vitals:   07/04/20 0645 07/04/20 0700 07/04/20 0715 07/04/20 0815  BP:  (!) 169/88 (!) 168/61 (!) 134/51  Pulse: 79 85 83 71  Resp: 20 17 (!) 21 20  Temp:      TempSrc:      SpO2: 96% 97% 96% 98%  Weight:      Height:       Eyes: PERRL, lids and conjunctivae normal ENMT: Mucous membranes are moist. Posterior pharynx clear of any exudate or lesions. Neck: normal, supple, no masses, no thyromegaly Respiratory: clear to auscultation bilaterally, no wheezing, no crackles. Normal respiratory effort. No accessory muscle use.  Cardiovascular: Regular rate and rhythm, no murmurs / rubs / gallops. No extremity edema. 2+ pedal pulses. No carotid bruits.  Abdomen: no tenderness, no masses palpated. No hepatosplenomegaly. Bowel sounds positive.  Musculoskeletal: no clubbing / cyanosis. No joint deformity upper and lower extremities. Good ROM, no contractures. Normal muscle tone.  Skin: no rashes, lesions, ulcers. No induration Neurologic: CN 2-12 grossly intact. Sensation intact, DTR normal. Strength 5/5 in all 4.  Psychiatric: Normal judgment and insight. Alert and oriented x 3. Normal mood.     Labs on Admission: I have personally reviewed following labs and imaging studies  CBC: Recent Labs  Lab 07/03/20 2026  WBC 2.8*  NEUTROABS 2.4  HGB 12.2*  HCT 37.6*  MCV 94.9  PLT 951*   Basic Metabolic Panel: Recent Labs  Lab 07/03/20 2026  NA 134*  K 4.8  CL 102  CO2 25  GLUCOSE 163*  BUN 24*  CREATININE 1.28*  CALCIUM 8.6*   GFR: Estimated Creatinine Clearance: 45 mL/min (A) (by C-G formula based on SCr of 1.28 mg/dL  (H)). Liver Function Tests: Recent Labs  Lab 07/03/20 2026  AST 36  ALT 30  ALKPHOS 53  BILITOT 0.5  PROT 5.9*  ALBUMIN 3.4*   Recent Labs  Lab 07/03/20  2026  LIPASE 36   No results for input(s): AMMONIA in the last 168 hours. Coagulation Profile: No results for input(s): INR, PROTIME in the last 168 hours. Cardiac Enzymes: Recent Labs  Lab 07/03/20 2026  CKTOTAL 79   BNP (last 3 results) No results for input(s): PROBNP in the last 8760 hours. HbA1C: No results for input(s): HGBA1C in the last 72 hours. CBG: No results for input(s): GLUCAP in the last 168 hours. Lipid Profile: No results for input(s): CHOL, HDL, LDLCALC, TRIG, CHOLHDL, LDLDIRECT in the last 72 hours. Thyroid Function Tests: No results for input(s): TSH, T4TOTAL, FREET4, T3FREE, THYROIDAB in the last 72 hours. Anemia Panel: No results for input(s): VITAMINB12, FOLATE, FERRITIN, TIBC, IRON, RETICCTPCT in the last 72 hours. Urine analysis:    Component Value Date/Time   COLORURINE YELLOW 07/03/2020 1953   APPEARANCEUR HAZY (A) 07/03/2020 1953   LABSPEC 1.028 07/03/2020 1953   PHURINE 5.0 07/03/2020 1953   GLUCOSEU NEGATIVE 07/03/2020 1953   HGBUR NEGATIVE 07/03/2020 1953   BILIRUBINUR NEGATIVE 07/03/2020 1953   KETONESUR NEGATIVE 07/03/2020 1953   PROTEINUR 30 (A) 07/03/2020 1953   UROBILINOGEN 0.2 02/02/2014 1109   NITRITE NEGATIVE 07/03/2020 1953   LEUKOCYTESUR NEGATIVE 07/03/2020 1953   Sepsis Labs: Recent Results (from the past 240 hour(s))  Resp Panel by RT-PCR (Flu A&B, Covid) Nasopharyngeal Swab     Status: None   Collection Time: 07/04/20  7:04 AM   Specimen: Nasopharyngeal Swab; Nasopharyngeal(NP) swabs in vial transport medium  Result Value Ref Range Status   SARS Coronavirus 2 by RT PCR NEGATIVE NEGATIVE Final    Comment: (NOTE) SARS-CoV-2 target nucleic acids are NOT DETECTED.  The SARS-CoV-2 RNA is generally detectable in upper respiratory specimens during the acute phase of  infection. The lowest concentration of SARS-CoV-2 viral copies this assay can detect is 138 copies/mL. A negative result does not preclude SARS-Cov-2 infection and should not be used as the sole basis for treatment or other patient management decisions. A negative result may occur with  improper specimen collection/handling, submission of specimen other than nasopharyngeal swab, presence of viral mutation(s) within the areas targeted by this assay, and inadequate number of viral copies(<138 copies/mL). A negative result must be combined with clinical observations, patient history, and epidemiological information. The expected result is Negative.  Fact Sheet for Patients:  EntrepreneurPulse.com.au  Fact Sheet for Healthcare Providers:  IncredibleEmployment.be  This test is no t yet approved or cleared by the [place] FDA and  has been authorized for detection and/or diagnosis of SARS-CoV-2 by FDA under an Emergency Use Authorization (EUA). This EUA will remain  in effect (meaning this test can be used) for the duration of the COVID-19 declaration under Section 564(b)(1) of the Act, 21 U.S.C.section 360bbb-3(b)(1), unless the authorization is terminated  or revoked sooner.       Influenza A by PCR NEGATIVE NEGATIVE Final   Influenza B by PCR NEGATIVE NEGATIVE Final    Comment: (NOTE) The Xpert Xpress SARS-CoV-2/FLU/RSV plus assay is intended as an aid in the diagnosis of influenza from Nasopharyngeal swab specimens and should not be used as a sole basis for treatment. Nasal washings and aspirates are unacceptable for Xpert Xpress SARS-CoV-2/FLU/RSV testing.  Fact Sheet for Patients: EntrepreneurPulse.com.au  Fact Sheet for Healthcare Providers: IncredibleEmployment.be  This test is not yet approved or cleared by the [place] FDA and has been authorized for detection and/or diagnosis of SARS-CoV-2  by FDA under an Emergency Use Authorization (EUA). This EUA  will remain in effect (meaning this test can be used) for the duration of the COVID-19 declaration under Section 564(b)(1) of the Act, 21 U.S.C. section 360bbb-3(b)(1), unless the authorization is terminated or revoked.  Performed at [facility], [place] [address] [place], [place] [address]      Radiological Exams on Admission: DG Chest 2 View  Result Date: 07/03/2020 CLINICAL DATA:  Chest pain and shortness of burr EXAM: CHEST - 2 VIEW COMPARISON:  05/14/2013 FINDINGS: Cardiac shadow is within normal limits. Aortic calcifications are seen. Lungs are clear bilaterally. Degenerative change of the thoracic spine is noted. IMPRESSION: No active cardiopulmonary disease. Electronically Signed   By: Inez Catalina M.D.   On: 07/03/2020 20:30    EKG: Independently reviewed.  Sinus rhythm with premature atrial complexes  Assessment/Plan Chest pain elevated troponin: Acute.  Patient presented with complaints of substernal chest discomfort that he reported felt like indigestion.  However noted indigestion symptoms are not to previous heart attack requiring stents.  High-sensitivity troponins 17->19.  No significant ischemic changes noted on EKG. -Admit to a cardiac telemetry bed -Check lipid panel, hemoglobin A1c, d-dimer, and repeat troponin -Check echocardiogram -D-dimer was noted to be significantly elevated at 3.37 -Orders placed to check a CT angiogram of the chest with contrast and Doppler ultrasound of the lower extremities -Send message to cardiology to evaluate for need of stress testing  SIRS: Patient was noted to be tachycardic and tachypneic with reports of fever up to 100.7 F at home, but had remained afebrile in the emergency department.  Labs significant for WBC 2.8.  Chest x-ray was clear and urinalysis showed no signs of infection.  COVID-19 and influenza screening was negative.  Suspect this may likely be secondary  to some viral syndrome. -Recheck CBC in a.m. -Continue to monitor off antibiotics at this time -Recommend checking blood cultures if patient spikes fever  CAD: Patient with prior inferior MI in 2002 with PCI, this post DES to LAD in 2000, status post DES to LAD and Cypher DES tomLCx 5/06, and LHC in 2011 with patent stents.  Dizziness: Patient reports that whenever he is standing he feels as though everything is moving.  Previously he had been told to take Flomax every other day but had recently restarted taking it daily due to having manic BPH.  question the possibility of BPPV. -Will place vestibular therapy consult in a.m. if work-up negative.  Renal insufficiency: Patient presents with creatinine 1.28 with BUN 28.  Previous baseline creatinine previously noted to be around 1 back in 2020.  This is not greater than 0.3 increase in kidney function to signify a acute kidney injury. -Continue to monitor  Essential hypertension: Home blood pressure medications include amlodipine 5 mg daily and ramipril 10 mg. -Continue home regimen  Diabetes mellitus type 2: On admission patient glucose 163.  Last hemoglobin A1c from 01/2018 was 5.7.  Patient's home medications include jentadueto 2.05-998 mg daily. -Hypoglycemic protocol -Check hemoglobin A1c -Hold jentadueto -CBG's before every meal with sensitive SSI  BPH: Patient reported having symptomatic BPH and has been taking Flomax daily. -Continue tamsulosin  GERD -Pharmacy substitution Protonix for omeprazole  DVT prophylaxis: Lovenox Code Status: Full Family Communication: Son updated at bedside Disposition Plan: Hopefully home if work-up negative Consults called: Message sent for cardiology to eval Admission status: Observation  Norval Morton MD [facility]   If 7PM-7AM, please contact night-coverage   07/04/2020, 10:10 AM

## 2020-07-04 NOTE — Progress Notes (Signed)
  Echocardiogram 2D Echocardiogram has been performed.  [name] Ray F 07/04/2020, 1:51 PM

## 2020-07-04 NOTE — ED Provider Notes (Signed)
[facility] EMERGENCY DEPARTMENT Provider Note   CSN: [id number] Arrival date & time: 07/03/20  1901     History Chief Complaint  Patient presents with   Shortness of Breath   Generalized Body Aches    [name] Ray is a 87 y.o. male.  87 year old male with prior medical history as detailed below presents for evaluation.  Patient complains of gradually progressive symptoms for the last 48 hours.  Patient complains of generalized weakness and fatigue.  He complains of midsternal achy chest discomfort with associated mild shortness of breath.  He reports low-grade temperatures at home.  He denies cough.  He denies known exposure to COVID-positive patient.  He is status post COVID vaccination and booster x2.  He reports that he attempted to obtain a COVID test yesterday at [facility].  He was told his results will be returned midweek.  He presented to the ED with primary concern over his reported chest discomfort.  At the time of my evaluation he reports that his chest pain is resolved.  The history is provided by the patient and medical records.  Illness Location:  Malaise, fatigue, chest pain Severity:  Mild Onset quality:  Gradual Duration:  2 days Timing:  Intermittent Progression:  Waxing and waning Chronicity:  New Associated symptoms: chest pain, fatigue and shortness of breath   Associated symptoms: no abdominal pain       Past Medical History:  Diagnosis Date   Adenomatous polyp    history   Arthritis    knees , shoulders, hands    Atrial bigeminy    in the past   BPH (benign prostatic hyperplasia)    Carotid artery disease ([place])    a. Doppler, January, 2012, 40-59% bilateral stenoses;  b.  Carotids 4/14:  40-59% bilat ICA, occluded R vertebral artery; f/u 1 year // Carotid US 07/2018:  Bilat ICA 1-39; R subclavian stenosis >> rpt in 1 year  // Carotid US 07/2019: Bilateral ICA 40-59; right subclavian stenosis; repeat 1 year    Colon polyps     Coronary artery disease    acute inferior MI Jan 2002 with stent / Taxus stent LAD 2004 / Cypher stent prox LAD and cypher stent mid circ May 2006 / nuclear stress Dec 2009 normal EF 63% / nuclear August 2011 EF 62% / inferior thining and very mild ischemia inferior and apex / catherization Sept 2011 EF 60%  mild to moderate diffuse RCA disease, moderate discrete LAD stenosis before the stent in the proximal LAD med tx rec   Diverticulosis    Dyslipidemia    Ejection fraction    Fatty liver    history   GERD (gastroesophageal reflux disease)    Hypertension    [facility] - 5 stents total , last cardiac cath. 2011.  Cleared for  surgery by Dr. [name]- 04/23/2013   IBS (irritable bowel syndrome)    Myocardial infarction [facility]) 2002   Myoview    Myoview 07/2018: EF 63, inferior artifact, no ischemia or scar; Low Risk   Numbness and tingling in hands    resolved   Overweight(278.02)    Prostate cancer ([place]) approx 2016   treated /w  Lupron injection x1, then radium   Shortness of breath dyspnea    Type II diabetes mellitus [facility])     Patient Active Problem List   Diagnosis Date Noted   Chest pain 07/04/2020   OA (osteoarthritis) of hip 01/26/2018   Spinal stenosis of lumbar  region with neurogenic claudication 09/11/2017   Episodic lightheadedness 05/04/2014   Ejection fraction    S/P total knee arthroplasty 05/24/2013   Carotid artery disease (HCC)    Coronary artery disease    Hyperlipidemia    GERD (gastroesophageal reflux disease)    Hypertension    Numbness and tingling in hands    FECAL OCCULT BLOOD 10/19/2009   COLONIC POLYPS, ADENOMATOUS, HX OF 10/19/2009   OVERWEIGHT 11/08/2008   DM 10/25/2008   GERD 10/25/2008    Past Surgical History:  Procedure Laterality Date   CARDIAC CATHETERIZATION     last cath- 2006   COLONOSCOPY     CORONARY ANGIOPLASTY WITH STENT PLACEMENT     multiple stents in LAD:  "I've got 5 stents" (05/24/2013)   EYE SURGERY Bilateral    cataract  surgery with lens implants   LUMBAR LAMINECTOMY/DECOMPRESSION MICRODISCECTOMY N/A 09/11/2017   Procedure: LAMINECTOMY AND FORAMINOTOMY LUMBAR One-Two, Two-Three, Three-Four, Four-Five, Five-Sacral One;  Surgeon: Newman Pies, MD;  Location: [place];  Service: Neurosurgery;  Laterality: N/A;  LAMINECTOMY AND FORAMINOTOMY LUMBAR One-Two,Two-Three, Three-Four, Four-Five, Five-Sacral One   NEUROPLASTY / TRANSPOSITION MEDIAN NERVE AT CARPAL TUNNEL Left    also surgery on thumb, elbow- nerve release    PROSTATE BIOPSY     followed later by [facility]   TOTAL HIP ARTHROPLASTY Left 01/26/2018   Procedure: TOTAL HIP ARTHROPLASTY ANTERIOR APPROACH;  Surgeon: [name], MD;  Location: [facility];  Service: Orthopedics;  Laterality: Left;  122min   TOTAL KNEE ARTHROPLASTY Right 05/24/2013   TOTAL KNEE ARTHROPLASTY Right 05/24/2013   Procedure: RIGHT TOTAL KNEE ARTHROPLASTY;  Surgeon: [name], MD;  Location: [place];  Service: Orthopedics;  Laterality: Right;   TOTAL KNEE ARTHROPLASTY Left 02/14/2014   Procedure: LEFT TOTAL KNEE ARTHROPLASTY;  Surgeon: [name], MD;  Location: [place];  Service: Orthopedics;  Laterality: Left;       Family History  Problem Relation Age of Onset   Hypertension Father    Heart disease Father    Heart disease Brother    Heart attack Paternal Grandfather     Social History   Tobacco Use   Smoking status: Never   Smokeless tobacco: Never  Vaping Use   Vaping Use: Never used  Substance Use Topics   Alcohol use: No   Drug use: No    Home Medications Prior to Admission medications   Medication Sig Start Date End Date Taking? Authorizing Provider  acetaminophen (TYLENOL) 500 MG tablet Take 1 tablet (500 mg total) by mouth every 6 (six) hours as needed for mild pain. 07/23/18   Richardson Dopp T, PA-C  amLODipine (NORVASC) 5 MG tablet Take 1 tablet (5 mg total) by mouth daily. 01/22/19   Richardson Dopp T, PA-C  amoxicillin (AMOXIL) 500 MG capsule Take 2,000 mg by mouth See  admin instructions. Take 4 capsules (2000 mg) by mouth 1 prior to dental appointments.    [provider]  clopidogrel (PLAVIX) 75 MG tablet Take 75 mg by mouth daily.    [provider]  Guaifenesin 1200 MG TB12 Take by mouth daily.    [provider]  JENTADUETO XR 2.05-998 MG TB24 Take 1 tablet by mouth 2 (two) times daily. 02/12/18   [provider]  loperamide (IMODIUM A-D) 2 MG tablet Take 2 mg by mouth 4 (four) times daily as needed for diarrhea or loose stools.    [provider]  loratadine (CLARITIN) 10 MG tablet Take 10 mg by mouth  daily as needed for allergies.    [provider]  nitroGLYCERIN (NITROSTAT) 0.4 MG SL tablet Place 1 tablet (0.4 mg total) under the tongue every 5 (five) minutes as needed for chest pain. Please keep upcoming appt. Thank you 04/23/17   Sherren Mocha, MD  omeprazole (PRILOSEC) 20 MG capsule Take 20 mg by mouth daily.    [provider]  ramipril (ALTACE) 10 MG capsule Take 10 mg by mouth daily after breakfast.    [provider]  simvastatin (ZOCOR) 20 MG tablet Take 20 mg by mouth at bedtime.    [provider]  tamsulosin (FLOMAX) 0.4 MG CAPS capsule Take 0.4 mg by mouth every other day.  04/01/13   [provider]    Allergies    Atorvastatin, Rosuvastatin, and Clindamycin  Review of Systems   Review of Systems  Constitutional:  Positive for fatigue.  Respiratory:  Positive for shortness of breath.   Cardiovascular:  Positive for chest pain.  Gastrointestinal:  Negative for abdominal pain.  All other systems reviewed and are negative.  Physical Exam Updated Vital Signs BP (!) 134/51   Pulse 71   Temp 98.3 F (36.8 C) (Oral)   Resp 20   Ht 5\' 6"  (1.676 m)   Wt 89.4 kg   SpO2 98%   BMI 31.80 kg/m   Physical Exam Vitals and nursing note reviewed.  Constitutional:      General: He is not in acute distress.    Appearance: He is well-developed.  HENT:      Head: Normocephalic and atraumatic.  Eyes:     Conjunctiva/sclera: Conjunctivae normal.     Pupils: Pupils are equal, round, and reactive to light.  Cardiovascular:     Rate and Rhythm: Normal rate and regular rhythm.     Heart sounds: Normal heart sounds.  Pulmonary:     Effort: Pulmonary effort is normal. No respiratory distress.     Breath sounds: Normal breath sounds.  Abdominal:     General: There is no distension.     Palpations: Abdomen is soft.     Tenderness: There is no abdominal tenderness.  Musculoskeletal:        General: No deformity. Normal range of motion.     Cervical back: Normal range of motion and neck supple.  Skin:    General: Skin is warm and dry.  Neurological:     General: No focal deficit present.     Mental Status: He is alert and oriented to person, place, and time.    ED Results / Procedures / Treatments   Labs (all labs ordered are listed, but only abnormal results are displayed) Labs Reviewed  COMPREHENSIVE METABOLIC PANEL - Abnormal; Notable for the following components:      Result Value   Sodium 134 (*)    Glucose, Bld 163 (*)    BUN 24 (*)    Creatinine, Ser 1.28 (*)    Calcium 8.6 (*)    Total Protein 5.9 (*)    Albumin 3.4 (*)    GFR, Estimated 55 (*)    All other components within normal limits  CBC WITH DIFFERENTIAL/PLATELET - Abnormal; Notable for the following components:   WBC 2.8 (*)    RBC 3.96 (*)    Hemoglobin 12.2 (*)    HCT 37.6 (*)    Platelets 104 (*)    Lymphs Abs 0.1 (*)    All other components within normal limits  URINALYSIS, ROUTINE W REFLEX MICROSCOPIC -  Abnormal; Notable for the following components:   APPearance HAZY (*)    Protein, ur 30 (*)    Bacteria, UA RARE (*)    All other components within normal limits  BRAIN NATRIURETIC PEPTIDE - Abnormal; Notable for the following components:   B Natriuretic Peptide 141.6 (*)    All other components within normal limits  TROPONIN I (HIGH SENSITIVITY) -  Abnormal; Notable for the following components:   Troponin I (High Sensitivity) 19 (*)    All other components within normal limits  RESP PANEL BY RT-PCR (FLU A&B, COVID) ARPGX2  LIPASE, BLOOD  CK  TROPONIN I (HIGH SENSITIVITY)    EKG EKG Interpretation  Date/Time:  Tuesday July 04 2020 05:00:32 EDT Ventricular Rate:  69 PR Interval:  170 QRS Duration: 76 QT Interval:  356 QTC Calculation: 381 R Axis:   1 Text Interpretation: Sinus rhythm with Premature atrial complexes Cannot rule out Anterior infarct , age undetermined Abnormal ECG When compared with ECG of 07/03/2020, No significant change was found Confirmed by Delora Fuel ([id number]) on 07/04/2020 5:55:10 AM  Radiology DG Chest 2 View  Result Date: 07/03/2020 CLINICAL DATA:  Chest pain and shortness of burr EXAM: CHEST - 2 VIEW COMPARISON:  05/14/2013 FINDINGS: Cardiac shadow is within normal limits. Aortic calcifications are seen. Lungs are clear bilaterally. Degenerative change of the thoracic spine is noted. IMPRESSION: No active cardiopulmonary disease. Electronically Signed   By: Inez Catalina M.D.   On: 07/03/2020 20:30    Procedures Procedures   Medications Ordered in ED Medications  acetaminophen (TYLENOL) tablet 1,000 mg (1,000 mg Oral Given 07/04/20 0722)  sodium chloride 0.9 % bolus 500 mL (500 mLs Intravenous New Bag/Given 07/04/20 0736)    ED Course  I have reviewed the triage vital signs and the nursing notes.  Pertinent labs & imaging results that were available during my care of the patient were reviewed by me and considered in my medical decision making (see chart for details).    MDM Rules/Calculators/A&P                          MDM  MSE complete  LINTON STOLP was evaluated in Emergency Department on 07/04/2020 for the symptoms described in the history of present illness. He was evaluated in the context of the global COVID-19 pandemic, which necessitated consideration that the patient might be at risk  for infection with the SARS-CoV-2 virus that causes COVID-19. Institutional protocols and algorithms that pertain to the evaluation of patients at risk for COVID-19 are in a state of rapid change based on information released by regulatory bodies including the CDC and federal and state organizations. These policies and algorithms were followed during the patient's care in the ED.  Patient is presenting with a host of complaints including myalgia, fatigue, chest pain.  Presentation is somewhat concerning for likely viral syndrome.  COVID and flu testing are pending.  Patient's described chest discomfort is somewhat atypical without evidence of ischemia on EKG.  Initial troponins are reassuring.  However, patient with extensive history of CAD without recent intervention.  We will plan for observation admission.  Hospitalist service is aware of case and will evaluate for same.  Final Clinical Impression(s) / ED Diagnoses Final diagnoses:  Chest pain, unspecified type    Rx / DC Orders ED Discharge Orders     None        Valarie Merino, MD 07/04/20 [id number]

## 2020-07-05 ENCOUNTER — Observation Stay (HOSPITAL_COMMUNITY): Payer: Medicare PPO

## 2020-07-05 DIAGNOSIS — K219 Gastro-esophageal reflux disease without esophagitis: Secondary | ICD-10-CM | POA: Diagnosis not present

## 2020-07-05 DIAGNOSIS — R7989 Other specified abnormal findings of blood chemistry: Secondary | ICD-10-CM

## 2020-07-05 DIAGNOSIS — R42 Dizziness and giddiness: Secondary | ICD-10-CM

## 2020-07-05 DIAGNOSIS — W57XXXA Bitten or stung by nonvenomous insect and other nonvenomous arthropods, initial encounter: Secondary | ICD-10-CM

## 2020-07-05 DIAGNOSIS — I2510 Atherosclerotic heart disease of native coronary artery without angina pectoris: Secondary | ICD-10-CM | POA: Diagnosis not present

## 2020-07-05 DIAGNOSIS — R079 Chest pain, unspecified: Secondary | ICD-10-CM

## 2020-07-05 DIAGNOSIS — D61818 Other pancytopenia: Secondary | ICD-10-CM | POA: Diagnosis not present

## 2020-07-05 DIAGNOSIS — R197 Diarrhea, unspecified: Secondary | ICD-10-CM | POA: Diagnosis not present

## 2020-07-05 DIAGNOSIS — N289 Disorder of kidney and ureter, unspecified: Secondary | ICD-10-CM | POA: Diagnosis not present

## 2020-07-05 LAB — HEPATIC FUNCTION PANEL
ALT: 42 U/L (ref 0–44)
AST: 46 U/L — ABNORMAL HIGH (ref 15–41)
Albumin: 2.9 g/dL — ABNORMAL LOW (ref 3.5–5.0)
Alkaline Phosphatase: 87 U/L (ref 38–126)
Bilirubin, Direct: 0.2 mg/dL (ref 0.0–0.2)
Indirect Bilirubin: 0.7 mg/dL (ref 0.3–0.9)
Total Bilirubin: 0.9 mg/dL (ref 0.3–1.2)
Total Protein: 5.1 g/dL — ABNORMAL LOW (ref 6.5–8.1)

## 2020-07-05 LAB — CBC WITH DIFFERENTIAL/PLATELET
Abs Immature Granulocytes: 0 10*3/uL (ref 0.00–0.07)
Basophils Absolute: 0 10*3/uL (ref 0.0–0.1)
Basophils Relative: 1 %
Eosinophils Absolute: 0 10*3/uL (ref 0.0–0.5)
Eosinophils Relative: 0 %
HCT: 34.8 % — ABNORMAL LOW (ref 39.0–52.0)
Hemoglobin: 11.7 g/dL — ABNORMAL LOW (ref 13.0–17.0)
Lymphocytes Relative: 6 %
Lymphs Abs: 0.2 10*3/uL — ABNORMAL LOW (ref 0.7–4.0)
MCH: 31 pg (ref 26.0–34.0)
MCHC: 33.6 g/dL (ref 30.0–36.0)
MCV: 92.1 fL (ref 80.0–100.0)
Monocytes Absolute: 0.1 10*3/uL (ref 0.1–1.0)
Monocytes Relative: 4 %
Neutro Abs: 2.6 10*3/uL (ref 1.7–7.7)
Neutrophils Relative %: 89 %
Platelets: 79 10*3/uL — ABNORMAL LOW (ref 150–400)
RBC: 3.78 MIL/uL — ABNORMAL LOW (ref 4.22–5.81)
RDW: 13.2 % (ref 11.5–15.5)
WBC: 2.9 10*3/uL — ABNORMAL LOW (ref 4.0–10.5)
nRBC: 0 % (ref 0.0–0.2)
nRBC: 0 /100 WBC

## 2020-07-05 LAB — HEMOGLOBIN A1C
Hgb A1c MFr Bld: 5.9 % — ABNORMAL HIGH (ref 4.8–5.6)
Mean Plasma Glucose: 123 mg/dL

## 2020-07-05 LAB — BASIC METABOLIC PANEL
Anion gap: 8 (ref 5–15)
BUN: 21 mg/dL (ref 8–23)
CO2: 26 mmol/L (ref 22–32)
Calcium: 8.7 mg/dL — ABNORMAL LOW (ref 8.9–10.3)
Chloride: 97 mmol/L — ABNORMAL LOW (ref 98–111)
Creatinine, Ser: 1.29 mg/dL — ABNORMAL HIGH (ref 0.61–1.24)
GFR, Estimated: 55 mL/min — ABNORMAL LOW (ref >60)
Glucose, Bld: 119 mg/dL — ABNORMAL HIGH (ref 70–99)
Potassium: 4.8 mmol/L (ref 3.5–5.1)
Sodium: 131 mmol/L — ABNORMAL LOW (ref 135–145)

## 2020-07-05 LAB — HIV ANTIBODY (ROUTINE TESTING W REFLEX): HIV Screen 4th Generation wRfx: NONREACTIVE

## 2020-07-05 LAB — HEPATITIS PANEL, ACUTE
HCV Ab: NONREACTIVE
Hep A IgM: NONREACTIVE
Hep B C IgM: NONREACTIVE
Hepatitis B Surface Ag: NONREACTIVE

## 2020-07-05 LAB — VITAMIN B12: Vitamin B-12: 260 pg/mL (ref 180–914)

## 2020-07-05 LAB — FERRITIN: Ferritin: 280 ng/mL (ref 24–336)

## 2020-07-05 LAB — TSH: TSH: 3.203 u[IU]/mL (ref 0.350–4.500)

## 2020-07-05 LAB — C-REACTIVE PROTEIN: CRP: 7.2 mg/dL — ABNORMAL HIGH (ref <1.0)

## 2020-07-05 MED ORDER — LINAGLIPTIN-METFORMIN HCL ER 2.5-1000 MG PO TB24: 1.0000 | ORAL_TABLET | Freq: Every day | ORAL | Status: DC

## 2020-07-05 MED ORDER — LINAGLIPTIN 5 MG PO TABS
2.5000 mg | ORAL_TABLET | Freq: Every day | ORAL | Status: DC
  Administered 2020-07-06: 2.5 mg via ORAL
  Filled 2020-07-05: qty 1

## 2020-07-05 MED ORDER — DOXYCYCLINE HYCLATE 100 MG PO TABS
100.0000 mg | ORAL_TABLET | Freq: Two times a day (BID) | ORAL | Status: DC
  Administered 2020-07-05 – 2020-07-06 (×2): 100 mg via ORAL
  Filled 2020-07-05 (×2): qty 1

## 2020-07-05 MED ORDER — METFORMIN HCL ER 500 MG PO TB24
1000.0000 mg | ORAL_TABLET | Freq: Every day | ORAL | Status: DC
  Administered 2020-07-06: 1000 mg via ORAL
  Filled 2020-07-05: qty 2

## 2020-07-05 MED ORDER — DEXTROSE-NACL 5-0.9 % IV SOLN: INTRAVENOUS | Status: DC

## 2020-07-05 NOTE — Consult Note (Signed)
[facility] for Infectious Disease    Date of Admission:  07/03/2020     Reason for Consult: Tick bite/fever/pancytopenia    Referring Provider: Delphia Grates   Lines:  Peripheral iv's  Abx: none        Assessment: Thrombocytopenia Chronic anemia Lymphopenia Diarrhea new onset 6/22 Elevated creatinine ?chronic Troponinemia  87 yo male htn/hlp, cad, admitted 6/20 with 3 days chest pain/dizziness and subjective fever, with labs showing elevated cr, lymphopenia/thrombocytopnia, and stable chronic anemia, in setting frequent tick bite, which id consulted on   Agree tick bite related illness possible. Other viral infectious cause could be a role given pancytopenia as well. He lacks common features of tick related process though such as rash/headache, lft abnormality. However given his risk for progression to severe disease if missed, we can r/o and start empiric doxy.  Of note, he overally is subjectively feeling better and is without fever, except platelet count dropping and new diarrhea today  Will also send w/u for other cause of pancytopenia as well      Plan: ID w/u for pancytopenia, and basic labs for pancytopenia sent Peripheral blood smear Ehrlichia/rmfs serology sent; will defer lyme at this time given lack of diagnostic criteria for it Doxy empiric Will follow   I spent 60 minute reviewing data/chart, and coordinating care and >50% direct face to face time providing counseling/discussing diagnostics/treatment plan with patient   ------------------------------------------------ Principal Problem:   Chest pain Active Problems:   Type 2 diabetes mellitus with hyperlipidemia (HCC)   Coronary artery disease   GERD (gastroesophageal reflux disease)   Hypertension   Dizziness   SIRS (systemic inflammatory response syndrome) (HCC)   Renal insufficiency   BPH (benign prostatic hyperplasia)    HPI: [name] Ray is a 87 y.o. male with above  comorbidity admitted for chest pain/troponinemia but also story of tick bite, fatigue-dizziness of 5 days, and with admission objective progressive thrombocytopenia/new leukopenia, new onset diarrhea, which id consulted for evaluation of tick related process  [place], and reports seeing tick on his body/having tick bite regularly. He was well until 3 days prior to admission when started developing fatigue, dizziness-imbalance. He reported associated low grade fever up to 100.7  He also reports some chest pain  No headache, rash, joint pain, muscle pain, n/v/diarrh abd pain, cough, chest pain, sob.  Due to progressive fatigue and chest pain evaluated at ed and admitted 6/20. Trops moderately elevated, leukopenia, thrombocytopenia, and today 6/22 has an episode of diarrhea (watery)  He doesn't have diarrhea though since the one episode this morning  Overall he is feeling better subjectively. Appetite beginning to improve. Wants to go  I reviewed his labs. Platelet trending down, leukopenia with lymphocyte deficit, stable anemia. Cr is slightly up but no recent baseline; bun stable.  No abx here yet or microbiologic w/u  He is happily married long time wife Saw various wild animals around his house and takes care of his daughter's dog now and then  Family History  Problem Relation Age of Onset   Hypertension Father    Heart disease Father    Heart disease Brother    Heart attack Paternal Grandfather     Social History   Tobacco Use   Smoking status: Never   Smokeless tobacco: Never  Vaping Use   Vaping Use: Never used  Substance Use Topics   Alcohol use: No   Drug use: No  Allergies  Allergen Reactions   Atorvastatin Other (See Comments)    SEVERE LEG PAIN Can not walk Sever leg pain Can not walk   Rosuvastatin Other (See Comments)    JOINT PAIN  Makes knees hurt  REACTION: unknown Makes knees hurt    Clindamycin Rash    PATIENT  REFUSES    Review of Systems: ROS All Other ROS was negative, except mentioned above   Past Medical History:  Diagnosis Date   Adenomatous polyp    history   Arthritis    knees , shoulders, hands    Atrial bigeminy    in the past   BPH (benign prostatic hyperplasia)    Carotid artery disease ([place])    a. Doppler, January, 2012, 40-59% bilateral stenoses;  b.  Carotids 4/14:  40-59% bilat ICA, occluded R vertebral artery; f/u 1 year // Carotid US 07/2018:  Bilat ICA 1-39; R subclavian stenosis >> rpt in 1 year  // Carotid US 07/2019: Bilateral ICA 40-59; right subclavian stenosis; repeat 1 year    Colon polyps    Coronary artery disease    acute inferior MI Jan 2002 with stent / Taxus stent LAD 2004 / Cypher stent prox LAD and cypher stent mid circ May 2006 / nuclear stress Dec 2009 normal EF 63% / nuclear August 2011 EF 62% / inferior thining and very mild ischemia inferior and apex / catherization Sept 2011 EF 60%  mild to moderate diffuse RCA disease, moderate discrete LAD stenosis before the stent in the proximal LAD med tx rec   Diverticulosis    Dyslipidemia    Ejection fraction    Fatty liver    history   GERD (gastroesophageal reflux disease)    Hypertension    [facility] - 5 stents total , last cardiac cath. 2011.  Cleared for  surgery by Dr. [name]- 04/23/2013   IBS (irritable bowel syndrome)    Myocardial infarction [facility]) 2002   Myoview    Myoview 07/2018: EF 63, inferior artifact, no ischemia or scar; Low Risk   Numbness and tingling in hands    resolved   Overweight(278.02)    Prostate cancer (HCC) approx 2016   treated /w  Lupron injection x1, then radium   Shortness of breath dyspnea    Type II diabetes mellitus (HCC)        Scheduled Meds:  amLODipine  5 mg Oral Daily   clopidogrel  75 mg Oral Daily   enoxaparin (LOVENOX) injection  40 mg Subcutaneous Q24H   pantoprazole  40 mg Oral Daily   ramipril  10 mg Oral QPC breakfast   simvastatin  20 mg Oral  QHS   tamsulosin  0.4 mg Oral QHS   Continuous Infusions: PRN Meds:.acetaminophen, alum & mag hydroxide-simeth, loperamide, loratadine, nitroGLYCERIN, ondansetron (ZOFRAN) IV   OBJECTIVE: Blood pressure (!) 116/45, pulse 63, temperature 100 F (37.8 C), temperature source Oral, resp. rate 19, height 5\' 6"  (1.676 m), weight 89.4 kg, SpO2 97 %.  Physical Exam General/constitutional: no distress, pleasant HEENT: Normocephalic, PER, Conj Clear, EOMI, Oropharynx clear Neck supple CV: rrr no mrg Lungs: clear to auscultation, normal respiratory effort Abd: Soft, Nontender Ext: no edema Skin: No Rash Neuro: nonfocal MSK: no peripheral joint swelling/tenderness/warmth; back spines nontender Psych alert/oriented     Lab Results Lab Results  Component Value Date   WBC 2.9 (L) 07/05/2020   HGB 11.7 (L) 07/05/2020   HCT 34.8 (L) 07/05/2020   MCV 92.1 07/05/2020  PLT 79 (L) 07/05/2020    Lab Results  Component Value Date   CREATININE 1.29 (H) 07/05/2020   BUN 21 07/05/2020   NA 131 (L) 07/05/2020   K 4.8 07/05/2020   CL 97 (L) 07/05/2020   CO2 26 07/05/2020    Lab Results  Component Value Date   ALT 30 07/03/2020   AST 36 07/03/2020   ALKPHOS 53 07/03/2020   BILITOT 0.5 07/03/2020      Microbiology: Recent Results (from the past 240 hour(s))  Resp Panel by RT-PCR (Flu A&B, Covid) Nasopharyngeal Swab     Status: None   Collection Time: 07/04/20  7:04 AM   Specimen: Nasopharyngeal Swab; Nasopharyngeal(NP) swabs in vial transport medium  Result Value Ref Range Status   SARS Coronavirus 2 by RT PCR NEGATIVE NEGATIVE Final    Comment: (NOTE) SARS-CoV-2 target nucleic acids are NOT DETECTED.  The SARS-CoV-2 RNA is generally detectable in upper respiratory specimens during the acute phase of infection. The lowest concentration of SARS-CoV-2 viral copies this assay can detect is 138 copies/mL. A negative result does not preclude SARS-Cov-2 infection and should not be  used as the sole basis for treatment or other patient management decisions. A negative result may occur with  improper specimen collection/handling, submission of specimen other than nasopharyngeal swab, presence of viral mutation(s) within the areas targeted by this assay, and inadequate number of viral copies(<138 copies/mL). A negative result must be combined with clinical observations, patient history, and epidemiological information. The expected result is Negative.  Fact Sheet for Patients:  EntrepreneurPulse.com.au  Fact Sheet for Healthcare Providers:  IncredibleEmployment.be  This test is no t yet approved or cleared by the [place] FDA and  has been authorized for detection and/or diagnosis of SARS-CoV-2 by FDA under an Emergency Use Authorization (EUA). This EUA will remain  in effect (meaning this test can be used) for the duration of the COVID-19 declaration under Section 564(b)(1) of the Act, 21 U.S.C.section 360bbb-3(b)(1), unless the authorization is terminated  or revoked sooner.       Influenza A by PCR NEGATIVE NEGATIVE Final   Influenza B by PCR NEGATIVE NEGATIVE Final    Comment: (NOTE) The Xpert Xpress SARS-CoV-2/FLU/RSV plus assay is intended as an aid in the diagnosis of influenza from Nasopharyngeal swab specimens and should not be used as a sole basis for treatment. Nasal washings and aspirates are unacceptable for Xpert Xpress SARS-CoV-2/FLU/RSV testing.  Fact Sheet for Patients: EntrepreneurPulse.com.au  Fact Sheet for Healthcare Providers: IncredibleEmployment.be  This test is not yet approved or cleared by the [place] FDA and has been authorized for detection and/or diagnosis of SARS-CoV-2 by FDA under an Emergency Use Authorization (EUA). This EUA will remain in effect (meaning this test can be used) for the duration of the COVID-19 declaration under Section  564(b)(1) of the Act, 21 U.S.C. section 360bbb-3(b)(1), unless the authorization is terminated or revoked.  Performed at [facility], [place] [address], [place], [place] [address]      Serology:    Imaging: If present, new imagings (plain films, ct scans, and mri) have been personally visualized and interpreted; radiology reports have been reviewed. Decision making incorporated into the Impression / Recommendations.  6/21 cta chest 1. No pulmonary embolus or acute intrathoracic abnormality. 2. Coronary artery calcifications.  Aortic atherosclerosis.  Jabier Mutton, [place] for Infectious [place] [phone] pager    07/05/2020, 2:19 PM

## 2020-07-05 NOTE — Evaluation (Addendum)
Physical Therapy Evaluation/Vestibular Assessment Patient Details Name: [name] Ray MRN: [id number] DOB: 1933/07/24 Today's Date: 07/05/2020   History of Present Illness  87 y.o. male admitted on 07/03/20 for dizziness and chest pain.  Pt dx with atypical chest pain, CAD, fever and chills in the setting of recent tick bite.  Infections disease consulted for tick bite, pancytopenia.  Pt with significant PMH of B TKA, CAD, HTN, MI, numbness in R hand s/p carpal tunnel surgery, prostate CA, DM2, SOB, lumbar surgery, L THA.  Clinical Impression  Pt with no clear vestibular pathology with vestibular testing.  He is unsteady on his feet, more than baseline per self report and is agreeable to use a RW and do home therapy with his son in law who is a PT.  See details of vestibular testing below.  Next session we will walk hallway distance with RW and shoes donned and practice stairs in prep for d/c home (lots of stairs at his home).   PT to follow acutely for deficits listed below.       Follow Up Recommendations Other (comment) (patient's son in law is a PTand will do therapy with him at home.)    Equipment Recommendations  None recommended by PT    Recommendations for Other Services       Precautions / Restrictions Precautions Precautions: Fall      Mobility  Bed Mobility Overal bed mobility: Modified Independent                  Transfers Overall transfer level: Needs assistance   Transfers: Sit to/from Stand Sit to Stand: Min guard         General transfer comment: Min guard assist to steady for balance, cues to stand for a moment (and sit) before moving away from the bed.  Pt reports he does this at baseline.  Ambulation/Gait Ambulation/Gait assistance: Min assist Gait Distance (Feet): 85 Feet Assistive device: 1 person hand held assist (hallway railing) Gait Pattern/deviations: Step-through pattern;Staggering left;Staggering right     General Gait Details: Pt  with staggering gait pattern, holding therapist on the right and reaching regularly for hallway railing on the left "i'm not steady" pt able to self report that this is worse than usual and caught his right big toe on the floor stumbling forward x1.  We spoke about using an assistive dvice at home and he is agreeable to use the RW.  Stairs            Wheelchair Mobility    Modified Rankin (Stroke Patients Only)       Balance Overall balance assessment: Mild deficits observed, not formally tested          07/05/20 0001  Vestibular Assessment  General Observation wears bifocals  Symptom Behavior  Subjective history of current problem sudden onset dizziness "like i'm on a ship", imbalance, (+) tinnitis (long standing h/o), some "crawling" sensations in both ears at times none currently, no recent head injury, suspect tick bite/viral pathology, no whiplash, no double vision or blurry vision,  Type of Dizziness  Comment (feels like he is coming off of a boat)  Frequency of Dizziness transitions  Duration of Dizziness short  Symptom Nature Motion provoked;Positional  Aggravating Factors Supine to sit;Sit to stand  Relieving Factors Slow movements  Progression of Symptoms Better  History of similar episodes he reports he has some chronic lightheadedness  Oculomotor Exam  Oculomotor Alignment Normal  Spontaneous Absent  Gaze-induced  Absent  Smooth  Pursuits Saccades (mildly, but normal for his age)  Vestibulo-Ocular Reflex  VOR 1 Head Only (x 1 viewing) horizontal no problem, vertical mildly symptomatic, but can hold target  Positional Testing  Sidelying Test Sidelying Right;Sidelying Left  Sidelying Right  Sidelying Right Duration 0  Sidelying Right Symptoms No nystagmus  Sidelying Left  Sidelying Left Duration 0  Sidelying Left Symptoms No nystagmus                                     Pertinent Vitals/Pain Pain Assessment: No/denies pain    Home  Living Family/patient expects to be discharged to:: Private residence Living Arrangements: Spouse/significant other Available Help at Discharge: Family;Available 24 hours/day Type of Home: House Home Access: Stairs to enter Entrance Stairs-Rails: Right Entrance Stairs-Number of Steps: 5 Home Layout: Multi-level;Laundry or work area in [place]: Environmental consultant - 2 wheels;Cane - single point      Prior Function Level of Independence: Independent         Comments: Pt drives, likes to help others, retired Database administrator.     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    Lower Extremity Assessment Lower Extremity Assessment: RLE deficits/detail;LLE deficits/detail RLE Deficits / Details: h/o bil TKA, however, R knee seems to have a bit of a limp compared to the L LLE Deficits / Details: h/o bil TKA, however, R knee seems to have a bit of a limp compared to the L    Cervical / Trunk Assessment Cervical / Trunk Assessment: Other exceptions Cervical / Trunk Exceptions: h/o lumbar spine surgery  Communication   Communication: No difficulties  Cognition Arousal/Alertness: Awake/alert Behavior During Therapy: WFL for tasks assessed/performed Overall Cognitive Status: Within Functional Limits for tasks assessed                                 General Comments: He likes to joke, so make sure he is serious when answering important questions.      General Comments      Exercises     Assessment/Plan    PT Assessment Patient needs continued PT services  PT Problem List Decreased strength;Decreased activity tolerance;Decreased balance;Decreased mobility;Decreased knowledge of use of DME;Impaired sensation        PT Treatment Interventions DME instruction;Gait training;Functional mobility training;Stair training;Therapeutic activities;Therapeutic exercise;Balance training;Patient/family education    PT Goals (Current goals can be found in the Care Plan section)  Acute Rehab PT Goals Patient Stated Goal: to go home tomorrow PT Goal Formulation: With patient Time For Goal Achievement: 07/20/20 Potential to Achieve Goals: Good    Frequency Min 3X/week   Barriers to discharge        Co-evaluation               AM-PAC PT "6 Clicks" Mobility  Outcome Measure Help needed turning from your back to your side while in a flat bed without using bedrails?: None Help needed moving from lying on your back to sitting on the side of a flat bed without using bedrails?: None Help needed moving to and from a bed to a chair (including a wheelchair)?: None Help  needed standing up from a chair using your arms (e.g., wheelchair or bedside chair)?: A Little Help needed to walk in hospital room?: A Little Help needed climbing 3-5 steps with a railing? : A Little 6 Click Score: 21    End of Session   Activity Tolerance: Patient tolerated treatment well Patient left: in bed;Other (comment) (seated EOB with daughter in the room.)   PT Visit Diagnosis: Muscle weakness (generalized) (M62.81);Dizziness and giddiness (R42);Difficulty in walking, not elsewhere classified (R26.2)    Time: 4098-1191 PT Time Calculation (min) (ACUTE ONLY): 37 min   Charges:   PT Evaluation $PT Eval Moderate Complexity: 1 Mod PT Treatments $Therapeutic Activity: 8-22 mins       Verdene Lennert, PT, DPT  Acute Rehabilitation Ortho Tech Supervisor [phone] pager [phone]) [phone] office

## 2020-07-05 NOTE — Progress Notes (Addendum)
PROGRESS NOTE    [name] Ray  YTK:[id number] DOB: Apr 25, 1933 DOA: 07/03/2020 PCP: Haywood Pao, MD    Brief Narrative:  [name] Ray was admitted to the hospital with the working diagnosis of atypical chest pain.  87 year old male past medical history for hypertension, dyslipidemia and coronary artery disease who presents with chest pain and dizziness. Reported 3 days of feeling dizzy when standing.  He had intermittent fever, chills, muscle cramps, malaise, shortness of breath and substernal chest pain.  On his initial physical examination his temperature was 100.2 F, respirate 17-23, heart rate 57-85, blood pressure 169/88, heart rate 83, respiratory rate 21, oxygen saturation 96%, lungs clear to auscultation bilaterally, heart S1-S2, present, rhythmic, soft abdomen, no lower extremity edema, no rashes.  Sodium 134, potassium 4.8, chloride 102, bicarb 25, glucose 163, BUN 24, creatinine 1.28, high sensitive troponin 17-19-20.  AST 36, ALT 30.  White cell count 2.8, hemoglobin 12.2, hematocrit 37.6, platelets 104. SARS COVID-19 negative.  Urinalysis specific gravity 1.028, 30 protein, white cells 0-5. Chest radiograph negative for infiltrates.  EKG 87 bpm, left axis deviation, normal intervals, sinus rhythm with PACs, no significant ST segment or T wave changes.  Assessment & Plan:   Principal Problem:   Chest pain Active Problems:   Type 2 diabetes mellitus with hyperlipidemia (HCC)   Coronary artery disease   GERD (gastroesophageal reflux disease)   Hypertension   Dizziness   SIRS (systemic inflammatory response syndrome) (HCC)   Renal insufficiency   BPH (benign prostatic hyperplasia)   Atypical chest pain. CAD and dyslipidemia Patient with no further chest pain, EKG with no ischemic changes, mild troponin elevation not consistent with acute coronary syndrome.   Plan to continue medical therapy for coronary artery disease with clopidogrel, and ramipril.  Continue  with simvastatin.  Follow as outpatient.   2. Fever and chills in the setting of recent tick exposure. Positive attached ticked removed about 10 days ago. Patient very weak and deconditioned positive dizziness (dis-balance).   High pretest probability for tick borne illness.   Plan to start patient on oral doxycycline.  Follow up on Ehrlichia serologies, add [place] and lyme.   Consult PT and OT.   3. Obesity class 1. Calculated BMI is 31,8. Will need outpatient follow up.   4. AKI on CKD stage 2, hyponatremia. Serum cr at 1,29 with K at 4,8, NA 131 and serum bicarbonate at 26.  Add IV fluids with isotonic saline with D5 at 75 ml per H, follow up renal function in am. Avoid hypotension and nephrotoxic medications.   5. HTN. Continue blood pressure control with amlodipine.   6. T2DM. Glucose has been stable, will resume linagliptin and metformin    Status is: Observation  The patient remains OBS appropriate and will d/c before 2 midnights.  Dispo: The patient is from: Home              Anticipated d/c is to: Home              Patient currently is not medically stable to d/c.   Difficult to place patient No    DVT prophylaxis: Enoxaparin   Code Status:   full  Family Communication:  I spoke with patient's wife  at the bedside, we talked in detail about patient's condition, plan of care and prognosis and all questions were addressed.     Consultants:  Cardiology  ID    Antimicrobials:  Doxycycline     Subjective: Patient continue  to have chills and dis-balance, not back to his baseline, no nausea or vomiting, no chest pain or dyspnea.   Objective: Vitals:   07/05/20 0034 07/05/20 0537 07/05/20 1009 07/05/20 1212  BP: (!) 133/44 (!) 135/55 (!) 103/43 (!) 116/45  Pulse: 63 72 66 63  Resp: 18 20 20 19   Temp: 99.5 F (37.5 C) 99.7 F (37.6 C) 99.6 F (37.6 C) 100 F (37.8 C)  TempSrc: Oral Oral Oral Oral  SpO2: 97% 94% 100% 97%  Weight:      Height:         Intake/Output Summary (Last 24 hours) at 07/05/2020 1349 Last data filed at 07/05/2020 0800 Gross per 24 hour  Intake 240 ml  Output 900 ml  Net -660 ml   Filed Weights   07/03/20 1953  Weight: 89.4 kg    Examination:   General: Not in pain or dyspnea, deconditioned  Neurology: Awake and alert, non focal  E ENT: no pallor, no icterus, oral mucosa moist Cardiovascular: No JVD. S1-S2 present, rhythmic, no gallops, rubs, or murmurs. No lower extremity edema. Pulmonary: positive breath sounds bilaterally, with no wheezing, rhonchi or rales. Gastrointestinal. Abdomen soft and non tender Skin. No rashes Musculoskeletal: no joint deformities     Data Reviewed: I have personally reviewed following labs and imaging studies  CBC: Recent Labs  Lab 07/03/20 2026 07/04/20 1107 07/05/20 0316  WBC 2.8* 2.4* 2.9*  NEUTROABS 2.4  --  2.6  HGB 12.2* 11.3* 11.7*  HCT 37.6* 33.1* 34.8*  MCV 94.9 92.5 92.1  PLT 104* 82* 79*   Basic Metabolic Panel: Recent Labs  Lab 07/03/20 2026 07/04/20 1107 07/05/20 0316  NA 134* 133* 131*  K 4.8 4.4 4.8  CL 102 103 97*  CO2 25 23 26   GLUCOSE 163* 109* 119*  BUN 24* 24* 21  CREATININE 1.28* 1.14 1.29*  CALCIUM 8.6* 8.4* 8.7*   GFR: Estimated Creatinine Clearance: 44.6 mL/min (A) (by C-G formula based on SCr of 1.29 mg/dL (H)). Liver Function Tests: Recent Labs  Lab 07/03/20 2026  AST 36  ALT 30  ALKPHOS 53  BILITOT 0.5  PROT 5.9*  ALBUMIN 3.4*   Recent Labs  Lab 07/03/20 2026  LIPASE 36   No results for input(s): AMMONIA in the last 168 hours. Coagulation Profile: No results for input(s): INR, PROTIME in the last 168 hours. Cardiac Enzymes: Recent Labs  Lab 07/03/20 2026  CKTOTAL 79   BNP (last 3 results) No results for input(s): PROBNP in the last 8760 hours. HbA1C: Recent Labs    07/04/20 1107  HGBA1C 5.9*   CBG: No results for input(s): GLUCAP in the last 168 hours. Lipid Profile: Recent Labs     07/04/20 1107  CHOL 122  HDL 52  LDLCALC 51  TRIG 97  CHOLHDL 2.3   Thyroid Function Tests: No results for input(s): TSH, T4TOTAL, FREET4, T3FREE, THYROIDAB in the last 72 hours. Anemia Panel: No results for input(s): VITAMINB12, FOLATE, FERRITIN, TIBC, IRON, RETICCTPCT in the last 72 hours.    Radiology Studies: I have reviewed all of the imaging during this hospital visit personally     Scheduled Meds:  amLODipine  5 mg Oral Daily   clopidogrel  75 mg Oral Daily   enoxaparin (LOVENOX) injection  40 mg Subcutaneous Q24H   pantoprazole  40 mg Oral Daily   ramipril  10 mg Oral QPC breakfast   simvastatin  20 mg Oral QHS   tamsulosin  0.4 mg Oral  QHS   Continuous Infusions:   LOS: 0 days        Bradey Luzier Gerome Apley, MD

## 2020-07-05 NOTE — Plan of Care (Signed)
ID consult called for pancytopenia and suspected tick borne illness per Dr Audie Box request, appreciate.

## 2020-07-05 NOTE — Consult Note (Signed)
Cardiology Consultation:   Patient ID: [name] MRN: [id number]; DOB: [birth year]  Admit date: 07/03/2020 Date of Consult: 07/05/2020  PCP:  [name] Pao, MD   [name] Providers Cardiologist:  [name] Mocha, MD   {   Patient Profile:   [name] Ray is a 87 y.o. male with a hx of hypertension, dyslipidemia, CAD with previous PCI to RCA (BMS), LAD (2004 and 2006), and LCx 2006, type 2 diabetes, BPH, CKD stage  II-III, GERD, carotid stenosis, prostate cancer s/p radiation, who is being seen 07/05/2020 for the evaluation of chest pain and elevated troponin at the request of Dr. Tamala Julian.  History of Present Illness:   [name] follows [name] since 2010 and transition to follow [name] in 2017.   He has longstanding history of CAD with multiple intervention in the past, he has underwent BMS to distal and proximal RCA in 2002, Taxus DES to LAD in 2004, Cypher DES to pLAD and Cypher DES to mLCx in 2006.   His last left cardiac cath was done on 09/29/2009, EF was 60%, no LV RWMA, proximal RCA stent 30 to 40% in-stent restenosis, 30 to 40% stenosis through the mid RCA, 40% stenosis just prior to the bifurcation into PLV and PDA, left main had minimal disease, moderate sized ramus with 30% ostial stenosis, circumflex 30% stenosis, stent in the mid to distal circumflex with minimal in-stent restenosis, stent in the proximal LAD with mild in-stent restenosis,  a discrete shelf in the proximal LAD just before the stent 50 to 60% stenosis, this lesion was not flow-limiting,  remainder of LAD and diagonal system have no significant disease.  He was recommended medical management for mild to moderate diffuse RCA disease and moderate discrete proximal LAD stenosis just before the stent.   Last Lexiscan Myoview on 07/22/2018 is a low risk study without evidence of ischemia or infarction.   Echo on 07/04/20 showed LVEF 55 to 60%, no RWMA, grade I DD, RV normal, no significant valvular  disease.  He was last seen in the office on 03/21/2020 by [name] Ray, for routine follow-up of CAD and preop evaluation of right carpal tunnel release.  He was felt stable from CAD standpoint, continue to remain physically active, was felt acceptable risk for upcoming surgery, and continued on medical therapy with amlodipine, Plavix, PRN nitroglycerin, and simvastatin for hypertension and CAD.  Patient presented to the ER on 07/04/2020 complaining 3 days onset of dizziness while standing up.  He also reported mild mid-substernal chest pain, feels like heartburn sensation, symptom did improve with omeprazole. He states his pain has been occurring daily, not triggered by exertion, not relieved by resting. It seems occurring randomly. He felt his chronic dizziness is much worsened over the past 3 days, not sure if it's associated with chest pain. He denied any nausea, emesis,syncope. He does not check his BP regularly, but it usually runs 671 systolic. He states he took Flomax for BPH, was told to take it every other day before for dizziness, but it caused him to stop urinating.   He also reported fever with temperature 100.7, malaise, and chills since Sunday. He noted being bitten by multiple ticks since  Spring. He had some brief pain of hi right lower abdomen area for few times as well. He denied cough, emesis, dysuria. He had some diarrhea today.   Admission diagnostic revealed hyponatremia 133, BUN 24, creatinine 1.14, GFR >60. Hs Trop 19 >20.  LDL 51.  CBC  revealed pancytopenia with WBC 2400 ([place] 2136), hemoglobin 11.3, and PLT 82k.  D-dimer elevated 3.37.  Hemoglobin A1c 5.9%.  COVID and flu negative.CTA chest revealed no PE, aortic atherosclerosis without dissection, coronary artery calcification, no acute or focal airspace disease of lung.  EKG reveals sinus rhythm with a ventricular rate of 69 bpm, PACs, no acute changes.  Cardiology is consulted for chest pain and troponin elevation.     Past  Medical History:  Diagnosis Date   Adenomatous polyp    history   Arthritis    knees , shoulders, hands    Atrial bigeminy    in the past   BPH (benign prostatic hyperplasia)    Carotid artery disease ([place])    a. Doppler, January, 2012, 40-59% bilateral stenoses;  b.  Carotids 4/14:  40-59% bilat ICA, occluded R vertebral artery; f/u 1 year // Carotid US 07/2018:  Bilat ICA 1-39; R subclavian stenosis >> rpt in 1 year  // Carotid US 07/2019: Bilateral ICA 40-59; right subclavian stenosis; repeat 1 year    Colon polyps    Coronary artery disease    acute inferior MI Jan 2002 with stent / Taxus stent LAD 2004 / Cypher stent prox LAD and cypher stent mid circ May 2006 / nuclear stress Dec 2009 normal EF 63% / nuclear August 2011 EF 62% / inferior thining and very mild ischemia inferior and apex / catherization Sept 2011 EF 60%  mild to moderate diffuse RCA disease, moderate discrete LAD stenosis before the stent in the proximal LAD med tx rec   Diverticulosis    Dyslipidemia    Ejection fraction    Fatty liver    history   GERD (gastroesophageal reflux disease)    Hypertension    [facility] - 5 stents total , last cardiac cath. 2011.  Cleared for  surgery by [name]- 04/23/2013   IBS (irritable bowel syndrome)    Myocardial infarction [facility]) 2002   Myoview    Myoview 07/2018: EF 63, inferior artifact, no ischemia or scar; Low Risk   Numbness and tingling in hands    resolved   Overweight(278.02)    Prostate cancer ([place]) approx 2016   treated /w  Lupron injection x1, then radium   Shortness of breath dyspnea    Type II diabetes mellitus ([place])     Past Surgical History:  Procedure Laterality Date   CARDIAC CATHETERIZATION     last cath- 2006   COLONOSCOPY     CORONARY ANGIOPLASTY WITH STENT PLACEMENT     multiple stents in LAD:  "I've got 5 stents" (05/24/2013)   EYE SURGERY Bilateral    cataract surgery with lens implants   LUMBAR LAMINECTOMY/DECOMPRESSION MICRODISCECTOMY N/A  09/11/2017   Procedure: LAMINECTOMY AND FORAMINOTOMY LUMBAR One-Two, Two-Three, Three-Four, Four-Five, Five-Sacral One;  Surgeon: Newman Pies, MD;  Location: [place];  Service: Neurosurgery;  Laterality: N/A;  LAMINECTOMY AND FORAMINOTOMY LUMBAR One-Two,Two-Three, Three-Four, Four-Five, Five-Sacral One   NEUROPLASTY / TRANSPOSITION MEDIAN NERVE AT CARPAL TUNNEL Left    also surgery on thumb, elbow- nerve release    PROSTATE BIOPSY     followed later by [facility]   TOTAL HIP ARTHROPLASTY Left 01/26/2018   Procedure: TOTAL HIP ARTHROPLASTY ANTERIOR APPROACH;  Surgeon: [name], MD;  Location: [facility];  Service: Orthopedics;  Laterality: Left;  153min   TOTAL KNEE ARTHROPLASTY Right 05/24/2013   TOTAL KNEE ARTHROPLASTY Right 05/24/2013   Procedure: RIGHT TOTAL KNEE ARTHROPLASTY;  Surgeon: [name], MD;  Location: [place];  Service: Orthopedics;  Laterality: Right;   TOTAL KNEE ARTHROPLASTY Left 02/14/2014   Procedure: LEFT TOTAL KNEE ARTHROPLASTY;  Surgeon: [name], MD;  Location: [place];  Service: Orthopedics;  Laterality: Left;     Home Medications:  Prior to Admission medications   Medication Sig Start Date End Date Taking? Authorizing Provider  acetaminophen (TYLENOL) 500 MG tablet Take 1 tablet (500 mg total) by mouth every 6 (six) hours as needed for mild pain. 07/23/18  Yes Weaver, Scott T, PA-C  amLODipine (NORVASC) 5 MG tablet Take 1 tablet (5 mg total) by mouth daily. 01/22/19  Yes Weaver, Scott T, PA-C  amoxicillin (AMOXIL) 500 MG capsule Take 2,000 mg by mouth once as needed (before dental appointment).   Yes [provider]  clopidogrel (PLAVIX) 75 MG tablet Take 75 mg by mouth daily.   Yes [provider]  Guaifenesin 1200 MG TB12 Take 1,200 mg by mouth daily as needed (cough).   Yes [provider]  JENTADUETO XR 2.05-998 MG TB24 Take 1 tablet by mouth daily. 02/12/18  Yes [provider]  loperamide (IMODIUM A-D) 2 MG tablet Take 2 mg by mouth 4  (four) times daily as needed for diarrhea or loose stools.   Yes [provider]  loratadine (CLARITIN) 10 MG tablet Take 10 mg by mouth daily as needed for allergies.   Yes [provider]  Multiple Vitamin (MULTI-VITAMIN) tablet Take 1 tablet by mouth daily.   Yes [provider]  nitroGLYCERIN (NITROSTAT) 0.4 MG SL tablet Place 1 tablet (0.4 mg total) under the tongue every 5 (five) minutes as needed for chest pain. Please keep upcoming appt. Thank you 04/23/17  Yes [facility], Legrand Como, MD  omeprazole (PRILOSEC) 20 MG capsule Take 20 mg by mouth daily.   Yes [provider]  ramipril (ALTACE) 10 MG capsule Take 10 mg by mouth daily after breakfast.   Yes [provider]  simvastatin (ZOCOR) 20 MG tablet Take 20 mg by mouth at bedtime.   Yes [provider]  tamsulosin (FLOMAX) 0.4 MG CAPS capsule Take 0.4 mg by mouth at bedtime. 04/01/13  Yes [provider]    Inpatient Medications: Scheduled Meds:  amLODipine  5 mg Oral Daily   clopidogrel  75 mg Oral Daily   enoxaparin (LOVENOX) injection  40 mg Subcutaneous Q24H   pantoprazole  40 mg Oral Daily   ramipril  10 mg Oral QPC breakfast   simvastatin  20 mg Oral QHS   tamsulosin  0.4 mg Oral QHS   Continuous Infusions:  PRN Meds: acetaminophen, alum & mag hydroxide-simeth, loperamide, loratadine, nitroGLYCERIN, ondansetron (ZOFRAN) IV  Allergies:    Allergies  Allergen Reactions   Atorvastatin Other (See Comments)    SEVERE LEG PAIN Can not walk Sever leg pain Can not walk   Rosuvastatin Other (See Comments)    JOINT PAIN  Makes knees hurt  REACTION: unknown Makes knees hurt    Clindamycin Rash    PATIENT REFUSES    Social History:   Social History   Socioeconomic History   Marital status: Married    Spouse name: Not on file   Number of children: Not on file   Years of education: Not on file   Highest education level: Not on file  Occupational History   Not  on file  Tobacco Use   Smoking status: Never   Smokeless tobacco: Never  Vaping Use   Vaping Use: Never used  Substance and Sexual Activity   Alcohol use: No   Drug use: No   Sexual activity: Not Currently  Other Topics Concern   Not on file  Social History Narrative   Not on file   Social Determinants of Health   Financial Resource Strain: Not on file  Food Insecurity: Not on file  Transportation Needs: Not on file  Physical Activity: Not on file  Stress: Not on file  Social Connections: Not on file  Intimate Partner Violence: Not on file    Family History:    Family History  Problem Relation Age of Onset   Hypertension Father    Heart disease Father    Heart disease Brother    Heart attack Paternal Grandfather      ROS:  Constitutional: see HPI Eyes: Denied vision change or loss Ears/Nose/Mouth/Throat: Denied ear ache, sore throat, coughing, sinus pain Cardiovascular: see HPI Respiratory: denied shortness of breath Gastrointestinal: Denied nausea, vomiting, abdominal pain, diarrhea Genital/Urinary: Denied dysuria, hematuria, urinary frequency/urgency Musculoskeletal: Denied muscle ache, joint pain, weakness Skin: Denied rash, wound Neuro: see HPI  Psych: Denied history of depression/anxiety  Endocrine: Denied history of diabetes     Physical Exam/Data:   Vitals:   07/05/20 0034 07/05/20 0537 07/05/20 1009 07/05/20 1212  BP: (!) 133/44 (!) 135/55 (!) 103/43 (!) 116/45  Pulse: 63 72 66 63  Resp: 18 20 20 19   Temp: 99.5 F (37.5 C) 99.7 F (37.6 C) 99.6 F (37.6 C) 100 F (37.8 C)  TempSrc: Oral Oral Oral Oral  SpO2: 97% 94% 100% 97%  Weight:      Height:        Intake/Output Summary (Last 24 hours) at 07/05/2020 1241 Last data filed at 07/05/2020 0800 Gross per 24 hour  Intake 240 ml  Output 900 ml  Net -660 ml   Last 3 Weights 07/03/2020 03/21/2020 05/10/2019  Weight (lbs) 197 lb 204 lb 198 lb 12.8 oz  Weight (kg) 89.359 kg 92.534 kg 90.175 kg      Body mass index is 31.8 kg/m.   Vitals:  Vitals:   07/05/20 1009 07/05/20 1212  BP: (!) 103/43 (!) 116/45  Pulse: 66 63  Resp: 20 19  Temp: 99.6 F (37.6 C) 100 F (37.8 C)  SpO2: 100% 97%   General Appearance: In no apparent distress, laying in bed HEENT: Normocephalic, atraumatic. EOMs intact.  Neck: Supple, trachea midline, no JVDs Cardiovascular: Regular rate and rhythm, normal S1-S2,  no murmur/rub/gallop Respiratory: Resting breathing unlabored, lungs sounds clear to auscultation bilaterally, no use of accessory muscles. On room air.  No wheezes, rales or rhonchi.   Gastrointestinal: Bowel sounds positive, abdomen soft, non-tender, non-distended.   Extremities: Able to move all extremities in bed without difficulty, trace BLE edema Genitourinary:genital exam not performed Musculoskeletal: Normal muscle bulk and tone, muscle strength 5/5 throughout, no limited range of motion, no swollen or erythematous joints Skin: Intact, warm, dry. No rashes or petechiae noted in exposed areas.  Neurologic: Alert, oriented to person, place and time. Fluent speech, memory loss,  no cognitive deficit, no gross focal neuro deficit Psychiatric: Normal affect. Mood is appropriate.    EKG:  The EKG was personally reviewed and demonstrates: Sinus rhythm with ventricular rate of 69 bpm, PACs, no acute ischemic change  Telemetry:  Telemetry was personally reviewed and demonstrates:  Sinus rhythm 60s, occasional PVCs   Relevant CV Studies:  Left cardiac cath was done on 09/29/2009, EF was 60%, no LV RWMA, proximal RCA  stent 30 to 40% in-stent restenosis, 30 to 40% stenosis through the mid RCA, 40% stenosis just prior to the bifurcation into PLV and PDA, left main had minimal disease, moderate sized ramus with 30% ostial stenosis, circumflex 30% stenosis, stent in the mid to distal circumflex with minimal in-stent restenosis, stent in the proximal LAD with mild in-stent restenosis,  a discrete  shelf in the proximal LAD just before the stent 50 to 60% stenosis, this lesion was not flow-limiting,  remainder of LAD and diagonal system have no significant disease.  He was recommended medical management for mild to moderate diffuse RCA disease and moderate discrete proximal LAD stenosis just before the stent.   Last Lexiscan Myoview on 07/22/2018 is a low risk study without evidence of ischemia or infarction.   Echo on 07/04/20 showed LVEF 55 to 60%, no RWMA, grade I DD, RV normal, no significant valvular disease.   Laboratory Data:  High Sensitivity Troponin:   Recent Labs  Lab 07/03/20 2026 07/04/20 0015 07/04/20 1107  TROPONINIHS 17 19* 20*     Chemistry Recent Labs  Lab 07/03/20 2026 07/04/20 1107 07/05/20 0316  NA 134* 133* 131*  K 4.8 4.4 4.8  CL 102 103 97*  CO2 25 23 26   GLUCOSE 163* 109* 119*  BUN 24* 24* 21  CREATININE 1.28* 1.14 1.29*  CALCIUM 8.6* 8.4* 8.7*  GFRNONAA 55* >60 55*  ANIONGAP 7 7 8     Recent Labs  Lab 07/03/20 2026  PROT 5.9*  ALBUMIN 3.4*  AST 36  ALT 30  ALKPHOS 53  BILITOT 0.5   Hematology Recent Labs  Lab 07/03/20 2026 07/04/20 1107 07/05/20 0316  WBC 2.8* 2.4* 2.9*  RBC 3.96* 3.58* 3.78*  HGB 12.2* 11.3* 11.7*  HCT 37.6* 33.1* 34.8*  MCV 94.9 92.5 92.1  MCH 30.8 31.6 31.0  MCHC 32.4 34.1 33.6  RDW 13.5 13.3 13.2  PLT 104* 82* 79*   BNP Recent Labs  Lab 07/03/20 2026  BNP 141.6*    DDimer  Recent Labs  Lab 07/04/20 1107  DDIMER 3.37*     Radiology/Studies:  DG Chest 2 View  Result Date: 07/03/2020 CLINICAL DATA:  Chest pain and shortness of burr EXAM: CHEST - 2 VIEW COMPARISON:  05/14/2013 FINDINGS: Cardiac shadow is within normal limits. Aortic calcifications are seen. Lungs are clear bilaterally. Degenerative change of the thoracic spine is noted. IMPRESSION: No active cardiopulmonary disease. Electronically Signed   By: Inez Catalina M.D.   On: 07/03/2020 20:30   CT Angio Chest Pulmonary Embolism (PE) W  or WO Contrast  Result Date: 07/04/2020 CLINICAL DATA:  PE suspected, low/intermediate prob, positive D-dimer Shortness of breath. EXAM: CT ANGIOGRAPHY CHEST WITH CONTRAST TECHNIQUE: Multidetector CT imaging of the chest was performed using the standard protocol during bolus administration of intravenous contrast. Multiplanar CT image reconstructions and MIPs were obtained to evaluate the vascular anatomy. CONTRAST:  19mL OMNIPAQUE IOHEXOL 350 MG/ML SOLN COMPARISON:  Radiograph yesterday. FINDINGS: Cardiovascular: There are no filling defects within the pulmonary arteries to suggest pulmonary embolus. Aortic atherosclerosis without dissection or acute aortic findings. Coronary artery calcifications. No pericardial effusion. Mediastinum/Nodes: No mediastinal or hilar adenopathy. No thyroid nodule. Decompressed esophagus. Lungs/Pleura: Incidental azygos fissure. No acute or focal airspace disease. No pleural fluid. No pulmonary edema. No pulmonary nodule or mass. Trachea and central bronchi are patent. Upper Abdomen: No acute or unexpected findings. Musculoskeletal: Thoracic spondylosis. There are no acute or suspicious osseous abnormalities. Mild degenerative change of the shoulders. Review  of the MIP images confirms the above findings. IMPRESSION: 1. No pulmonary embolus or acute intrathoracic abnormality. 2. Coronary artery calcifications.  Aortic atherosclerosis. Aortic Atherosclerosis (ICD10-I70.0). Electronically Signed   By: Keith Rake M.D.   On: 07/04/2020 17:58   ECHOCARDIOGRAM COMPLETE  Result Date: 07/04/2020    ECHOCARDIOGRAM REPORT   Patient Name:   [name] Ray Date of Exam: 07/04/2020 Medical Rec #:  [id number]       Height:       66.0 in Accession #:    [id number]      Weight:       197.0 lb Date of Birth:  1933/01/23        BSA:          1.987 m Patient Age:    26 years        BP:           151/62 mmHg Patient Gender: M               HR:           69 bpm. Exam Location:  Inpatient  Procedure: 2D Echo, Color Doppler and Cardiac Doppler Indications:    R07.9* Chest pain, unspecified  History:        Patient has prior history of Echocardiogram examinations, most                 recent 05/06/2014.  Sonographer:    Merrie Roof RDCS Referring Phys: [id number] [place]  1. Left ventricular ejection fraction, by estimation, is 55 to 60%. The left ventricle has normal function. The left ventricle has no regional wall motion abnormalities. There is mild left ventricular hypertrophy. Left ventricular diastolic parameters are consistent with Grade I diastolic dysfunction (impaired relaxation).  2. Right ventricular systolic function is normal. The right ventricular size is normal.  3. The mitral valve is normal in structure. No evidence of mitral valve regurgitation. No evidence of mitral stenosis.  4. The aortic valve is tricuspid. Aortic valve regurgitation is not visualized. No aortic stenosis is present. FINDINGS  Left Ventricle: Left ventricular ejection fraction, by estimation, is 55 to 60%. The left ventricle has normal function. The left ventricle has no regional wall motion abnormalities. The left ventricular internal cavity size was normal in size. There is  mild left ventricular hypertrophy. Left ventricular diastolic parameters are consistent with Grade I diastolic dysfunction (impaired relaxation). Right Ventricle: The right ventricular size is normal. Right ventricular systolic function is normal. Left Atrium: Left atrial size was normal in size. Right Atrium: Right atrial size was normal in size. Pericardium: There is no evidence of pericardial effusion. Mitral Valve: The mitral valve is normal in structure. No evidence of mitral valve regurgitation. No evidence of mitral valve stenosis. Tricuspid Valve: The tricuspid valve is normal in structure. Tricuspid valve regurgitation is trivial. No evidence of tricuspid stenosis. Aortic Valve: The aortic valve is tricuspid. Aortic  valve regurgitation is not visualized. No aortic stenosis is present. Aortic valve mean gradient measures 7.0 mmHg. Aortic valve peak gradient measures 10.8 mmHg. Aortic valve area, by VTI measures 1.95  cm. Pulmonic Valve: The pulmonic valve was normal in structure. Pulmonic valve regurgitation is not visualized. No evidence of pulmonic stenosis. Aorta: The aortic root is normal in size and structure. Venous: The inferior vena cava was not well visualized. IAS/Shunts: The interatrial septum was not well visualized.  LEFT VENTRICLE PLAX 2D LVIDd:         4.10  cm  Diastology LVIDs:         2.80 cm  LV e' medial:    7.07 cm/s LV PW:         1.20 cm  LV E/e' medial:  9.5 LV IVS:        1.10 cm  LV e' lateral:   6.64 cm/s LVOT diam:     2.00 cm  LV E/e' lateral: 10.2 LV SV:         73 LV SV Index:   37 LVOT Area:     3.14 cm  RIGHT VENTRICLE RV Basal diam:  4.20 cm RV Mid diam:    3.80 cm LEFT ATRIUM             Index       RIGHT ATRIUM           Index LA diam:        4.30 cm 2.16 cm/m  RA Area:     19.00 cm LA Vol (A2C):   61.7 ml 31.05 ml/m RA Volume:   45.50 ml  22.90 ml/m LA Vol (A4C):   51.2 ml 25.77 ml/m LA Biplane Vol: 55.7 ml 28.03 ml/m  AORTIC VALVE AV Area (Vmax):    1.91 cm AV Area (Vmean):   1.81 cm AV Area (VTI):     1.95 cm AV Vmax:           164.00 cm/s AV Vmean:          125.000 cm/s AV VTI:            0.374 m AV Peak Grad:      10.8 mmHg AV Mean Grad:      7.0 mmHg LVOT Vmax:         99.80 cm/s LVOT Vmean:        72.100 cm/s LVOT VTI:          0.232 m LVOT/AV VTI ratio: 0.62  AORTA Ao Root diam: 3.50 cm Ao Asc diam:  3.00 cm MITRAL VALVE MV Area (PHT): 2.66 cm     SHUNTS MV Decel Time: 285 msec     Systemic VTI:  0.23 m MV E velocity: 67.40 cm/s   Systemic Diam: 2.00 cm MV A velocity: 123.00 cm/s MV E/A ratio:  0.55 Kirk Ruths MD Electronically signed by Kirk Ruths MD Signature Date/Time: 07/04/2020/2:58:23 PM    Final    VAS [place] LOWER EXTREMITY VENOUS (DVT)  Result Date:  07/05/2020  Lower Venous DVT Study Patient Name:  [name] [facility]  Date of Exam:   07/05/2020 Medical Rec #: [id number]        Accession #:    [id number] Date of Birth: 09-30-1933         Patient Gender: M Patient Age:   15Y Exam Location:  [facility] Procedure:      VAS [place] LOWER EXTREMITY VENOUS (DVT) Referring Phys: [id number] RONDELL A Ray --------------------------------------------------------------------------------  Indications: Chest pain/dizziness & elevated D-dimer.  Comparison Study: No previous exams Performing Technologist: Jody Hill RVT, RDMS  Examination Guidelines: A complete evaluation includes B-mode imaging, spectral Doppler, color Doppler, and power Doppler as needed of all accessible portions of each vessel. Bilateral testing is considered an integral part of a complete examination. Limited examinations for reoccurring indications may be performed as noted. The reflux portion of the exam is performed with the patient in reverse Trendelenburg.  +---------+---------------+---------+-----------+----------+--------------+ RIGHT    CompressibilityPhasicitySpontaneityPropertiesThrombus Aging +---------+---------------+---------+-----------+----------+--------------+ CFV      Full  Yes      Yes                                 +---------+---------------+---------+-----------+----------+--------------+ SFJ      Full                                                        +---------+---------------+---------+-----------+----------+--------------+ FV Prox  Full           Yes      Yes                                 +---------+---------------+---------+-----------+----------+--------------+ FV Mid   Full           Yes      Yes                                 +---------+---------------+---------+-----------+----------+--------------+ FV DistalFull           Yes      Yes                                  +---------+---------------+---------+-----------+----------+--------------+ PFV      Full                                                        +---------+---------------+---------+-----------+----------+--------------+ POP      Full           Yes      Yes                                 +---------+---------------+---------+-----------+----------+--------------+ PTV      Full                                                        +---------+---------------+---------+-----------+----------+--------------+ PERO     Full                                                        +---------+---------------+---------+-----------+----------+--------------+   +---------+---------------+---------+-----------+----------+--------------+ LEFT     CompressibilityPhasicitySpontaneityPropertiesThrombus Aging +---------+---------------+---------+-----------+----------+--------------+ CFV      Full           Yes      Yes                                 +---------+---------------+---------+-----------+----------+--------------+ SFJ      Full                                                        +---------+---------------+---------+-----------+----------+--------------+  FV Prox  Full           Yes      Yes                                 +---------+---------------+---------+-----------+----------+--------------+ FV Mid   Full           Yes      Yes                                 +---------+---------------+---------+-----------+----------+--------------+ FV DistalFull           Yes      Yes                                 +---------+---------------+---------+-----------+----------+--------------+ PFV      Full                                                        +---------+---------------+---------+-----------+----------+--------------+ POP      Full           Yes      Yes                                  +---------+---------------+---------+-----------+----------+--------------+ PTV      Full                                                        +---------+---------------+---------+-----------+----------+--------------+ PERO     Full                                                        +---------+---------------+---------+-----------+----------+--------------+    Summary: BILATERAL: - No evidence of deep vein thrombosis seen in the lower extremities, bilaterally. - No evidence of superficial venous thrombosis in the lower extremities, bilaterally. -No evidence of popliteal cyst, bilaterally.   *See table(s) above for measurements and observations.    Preliminary      Assessment and Plan:   Chest pain, atypical  CAD with history of multiple PCI's to RCA, LAD, LCX - Presented with mid-sternum chest pain, worsened chronic dizziness for 3 days - Hs trop 17 >19, negative for ACS  - EKG without acute ischemic changes - Last LHC in 2011 revealed mild to moderate diffuse RCA disease and moderate discrete proximal LAD stenosis just before the stent, see report for details, recommended medical therapy at the time - Lexiscan Myoview on 07/22/2018 is a low risk study without evidence of ischemia or infarction.  - Echo on 07/04/20 showed LVEF 55 to 60%, no RWMA, grade I DD, RV normal, no significant valvular disease. - recommend ischemic evaluation with outpatient Lexiscan Myoveiw  - Continue current medical therapy with Plavix 75 mg, amlodipine 5 mg daily, ramipril 10mg ,  and simvastatin 20mg  daily (historically not on beta-blocker for unclear reason) - discharge on pantoprazole 40mg  daily, it seems helping chest pain symptoms   Pancytopenia Hx tick bites  -CBC revealed WBC 2400, hemoglobin 11.3, platelet 82k POA, new -self reports multiple tick bites since Spring, consider check lyme disease, etc, rule out infection   SIRS  -Presented with intermittent fever, tachypnea, malaise -Diagnostic  revealed pancytopenia (new), WBC 2400, not neutropenic at this time -CT imaging without occult infection; UA without significant pyuria; COVID/flu negative; Doppler negative for DVT -Source is unclear, consider RVP swab, blood culture, monitor signs/symptoms suggest infection  Bilateral carotid artery stenosis -Last carotid ultrasound 07/27/2019 with mild to moderate bilateral internal carotid artery plaque, slightly progressed, right ICA 40 to 59% stenosis, left ICA 40 to 59% stenosis, known right subclavian artery stenosis  -Will need repeat carotid ultrasound in 1 year  Hypertension -BP mildly elevated, resumed home meds amlodipine, ramipril ,advised patient check BP at home and bring logs to office   Dyslipidemia - LDL 51 on 07/04/2020, at goal, continue simvastatin  BPH -on Flomax  History of prostate cancer -Completed radiation in 2015, last follow-up with urology 01/26/2020, was in remission  GERD - DC on pantoprazole 40mg  daily    Risk Assessment/Risk Scores:  [id number]}   HEAR Score (for undifferentiated chest pain):  HEAR Score: 4{           For questions or updates, please contact [place] Please consult [email or web address] for contact info under    Signed, Margie Billet, NP  07/05/2020 12:41 PM

## 2020-07-05 NOTE — Progress Notes (Signed)
BLE venous duplex has been completed.  Dr. Cathlean Sauer messaged with preliminary results.  Results can be found under chart review under CV PROC. 07/05/2020 9:43 AM Cyd Hostler RVT, RDMS

## 2020-07-05 NOTE — Plan of Care (Signed)
  Problem: Education: Goal: Knowledge of General Education information will improve Description Including pain rating scale, medication(s)/side effects and non-pharmacologic comfort measures Outcome: Progressing   

## 2020-07-06 DIAGNOSIS — E1122 Type 2 diabetes mellitus with diabetic chronic kidney disease: Secondary | ICD-10-CM | POA: Diagnosis present

## 2020-07-06 DIAGNOSIS — E1169 Type 2 diabetes mellitus with other specified complication: Secondary | ICD-10-CM | POA: Diagnosis present

## 2020-07-06 DIAGNOSIS — D61818 Other pancytopenia: Secondary | ICD-10-CM | POA: Diagnosis present

## 2020-07-06 DIAGNOSIS — Z961 Presence of intraocular lens: Secondary | ICD-10-CM | POA: Diagnosis present

## 2020-07-06 DIAGNOSIS — W57XXXD Bitten or stung by nonvenomous insect and other nonvenomous arthropods, subsequent encounter: Secondary | ICD-10-CM | POA: Diagnosis not present

## 2020-07-06 DIAGNOSIS — K30 Functional dyspepsia: Secondary | ICD-10-CM | POA: Diagnosis present

## 2020-07-06 DIAGNOSIS — Z8546 Personal history of malignant neoplasm of prostate: Secondary | ICD-10-CM | POA: Diagnosis not present

## 2020-07-06 DIAGNOSIS — E785 Hyperlipidemia, unspecified: Secondary | ICD-10-CM | POA: Diagnosis present

## 2020-07-06 DIAGNOSIS — Z9841 Cataract extraction status, right eye: Secondary | ICD-10-CM | POA: Diagnosis not present

## 2020-07-06 DIAGNOSIS — I129 Hypertensive chronic kidney disease with stage 1 through stage 4 chronic kidney disease, or unspecified chronic kidney disease: Secondary | ICD-10-CM | POA: Diagnosis present

## 2020-07-06 DIAGNOSIS — Z955 Presence of coronary angioplasty implant and graft: Secondary | ICD-10-CM | POA: Diagnosis not present

## 2020-07-06 DIAGNOSIS — E669 Obesity, unspecified: Secondary | ICD-10-CM | POA: Diagnosis present

## 2020-07-06 DIAGNOSIS — K219 Gastro-esophageal reflux disease without esophagitis: Secondary | ICD-10-CM | POA: Diagnosis present

## 2020-07-06 DIAGNOSIS — I2510 Atherosclerotic heart disease of native coronary artery without angina pectoris: Secondary | ICD-10-CM | POA: Diagnosis present

## 2020-07-06 DIAGNOSIS — R6510 Systemic inflammatory response syndrome (SIRS) of non-infectious origin without acute organ dysfunction: Secondary | ICD-10-CM | POA: Diagnosis not present

## 2020-07-06 DIAGNOSIS — Z96642 Presence of left artificial hip joint: Secondary | ICD-10-CM | POA: Diagnosis present

## 2020-07-06 DIAGNOSIS — W57XXXA Bitten or stung by nonvenomous insect and other nonvenomous arthropods, initial encounter: Secondary | ICD-10-CM | POA: Diagnosis present

## 2020-07-06 DIAGNOSIS — N182 Chronic kidney disease, stage 2 (mild): Secondary | ICD-10-CM | POA: Diagnosis present

## 2020-07-06 DIAGNOSIS — R079 Chest pain, unspecified: Secondary | ICD-10-CM | POA: Diagnosis present

## 2020-07-06 DIAGNOSIS — Z9842 Cataract extraction status, left eye: Secondary | ICD-10-CM | POA: Diagnosis not present

## 2020-07-06 DIAGNOSIS — Z86010 Personal history of colonic polyps: Secondary | ICD-10-CM | POA: Diagnosis not present

## 2020-07-06 DIAGNOSIS — Z6831 Body mass index (BMI) 31.0-31.9, adult: Secondary | ICD-10-CM | POA: Diagnosis not present

## 2020-07-06 DIAGNOSIS — E871 Hypo-osmolality and hyponatremia: Secondary | ICD-10-CM | POA: Diagnosis present

## 2020-07-06 DIAGNOSIS — Z20822 Contact with and (suspected) exposure to covid-19: Secondary | ICD-10-CM | POA: Diagnosis present

## 2020-07-06 DIAGNOSIS — I252 Old myocardial infarction: Secondary | ICD-10-CM | POA: Diagnosis not present

## 2020-07-06 DIAGNOSIS — K589 Irritable bowel syndrome without diarrhea: Secondary | ICD-10-CM | POA: Diagnosis present

## 2020-07-06 DIAGNOSIS — Z96653 Presence of artificial knee joint, bilateral: Secondary | ICD-10-CM | POA: Diagnosis present

## 2020-07-06 DIAGNOSIS — N400 Enlarged prostate without lower urinary tract symptoms: Secondary | ICD-10-CM | POA: Diagnosis present

## 2020-07-06 LAB — BASIC METABOLIC PANEL
Anion gap: 7 (ref 5–15)
BUN: 24 mg/dL — ABNORMAL HIGH (ref 8–23)
CO2: 25 mmol/L (ref 22–32)
Calcium: 8.3 mg/dL — ABNORMAL LOW (ref 8.9–10.3)
Chloride: 100 mmol/L (ref 98–111)
Creatinine, Ser: 1.28 mg/dL — ABNORMAL HIGH (ref 0.61–1.24)
GFR, Estimated: 55 mL/min — ABNORMAL LOW (ref >60)
Glucose, Bld: 143 mg/dL — ABNORMAL HIGH (ref 70–99)
Potassium: 4.2 mmol/L (ref 3.5–5.1)
Sodium: 132 mmol/L — ABNORMAL LOW (ref 135–145)

## 2020-07-06 LAB — CBC WITH DIFFERENTIAL/PLATELET
Abs Immature Granulocytes: 0.01 10*3/uL (ref 0.00–0.07)
Basophils Absolute: 0 10*3/uL (ref 0.0–0.1)
Basophils Relative: 1 %
Eosinophils Absolute: 0 10*3/uL (ref 0.0–0.5)
Eosinophils Relative: 1 %
HCT: 30.8 % — ABNORMAL LOW (ref 39.0–52.0)
Hemoglobin: 10.3 g/dL — ABNORMAL LOW (ref 13.0–17.0)
Immature Granulocytes: 0 %
Lymphocytes Relative: 24 %
Lymphs Abs: 0.7 10*3/uL (ref 0.7–4.0)
MCH: 30.4 pg (ref 26.0–34.0)
MCHC: 33.4 g/dL (ref 30.0–36.0)
MCV: 90.9 fL (ref 80.0–100.0)
Monocytes Absolute: 0.3 10*3/uL (ref 0.1–1.0)
Monocytes Relative: 11 %
Neutro Abs: 1.9 10*3/uL (ref 1.7–7.7)
Neutrophils Relative %: 63 %
Platelets: 80 10*3/uL — ABNORMAL LOW (ref 150–400)
RBC: 3.39 MIL/uL — ABNORMAL LOW (ref 4.22–5.81)
RDW: 13.5 % (ref 11.5–15.5)
WBC: 3 10*3/uL — ABNORMAL LOW (ref 4.0–10.5)
nRBC: 0 % (ref 0.0–0.2)

## 2020-07-06 LAB — PARVOVIRUS B19 ANTIBODY, IGG AND IGM
Parovirus B19 IgG Abs: 5 index — ABNORMAL HIGH (ref 0.0–0.8)
Parovirus B19 IgM Abs: 0 index (ref 0.0–0.8)

## 2020-07-06 MED ORDER — DOXYCYCLINE HYCLATE 100 MG PO TABS: 100.0000 mg | ORAL_TABLET | Freq: Two times a day (BID) | ORAL | 0 refills | Status: AC

## 2020-07-06 MED ORDER — DOXYCYCLINE HYCLATE 100 MG PO TABS: 100.0000 mg | ORAL_TABLET | Freq: Two times a day (BID) | ORAL | 0 refills | Status: DC

## 2020-07-06 NOTE — Progress Notes (Signed)
Cardiology Progress Note  Patient ID: [name] Ray MRN: [id number] DOB: Feb 08, 1933 Date of Encounter: 07/06/2020  Primary Cardiologist: Sherren Mocha, MD  Subjective   Chief Complaint: None  HPI: Denies symptoms.  Discharge today.  ROS:  All other ROS reviewed and negative. Pertinent positives noted in the HPI.     Inpatient Medications  Scheduled Meds:  amLODipine  5 mg Oral Daily   clopidogrel  75 mg Oral Daily   doxycycline  100 mg Oral Q12H   enoxaparin (LOVENOX) injection  40 mg Subcutaneous Q24H   linagliptin  2.5 mg Oral Q breakfast   And   metFORMIN  1,000 mg Oral Q breakfast   pantoprazole  40 mg Oral Daily   ramipril  10 mg Oral QPC breakfast   simvastatin  20 mg Oral QHS   tamsulosin  0.4 mg Oral QHS   Continuous Infusions:  dextrose 5 % and 0.9% NaCl 75 mL/hr at 07/05/20 1826   PRN Meds: acetaminophen, alum & mag hydroxide-simeth, loperamide, loratadine, nitroGLYCERIN, ondansetron (ZOFRAN) IV   Vital Signs   Vitals:   07/05/20 1718 07/06/20 0021 07/06/20 0212 07/06/20 0522  BP: 105/63  (!) 127/53 (!) (P) 139/46  Pulse: (!) 53 63  (!) (P) 57  Resp: 18 18  (P) 18  Temp: 98.9 F (37.2 C) 99 F (37.2 C)  (P) 98.2 F (36.8 C)  TempSrc: Oral Oral  (P) Oral  SpO2: 98% 97%  (P) 97%  Weight:      Height:        Intake/Output Summary (Last 24 hours) at 07/06/2020 0937 Last data filed at 07/06/2020 0300 Gross per 24 hour  Intake 1116.37 ml  Output --  Net 1116.37 ml   Last 3 Weights 07/03/2020 03/21/2020 05/10/2019  Weight (lbs) 197 lb 204 lb 198 lb 12.8 oz  Weight (kg) 89.359 kg 92.534 kg 90.175 kg      Telemetry  Overnight telemetry shows sinus rhythm in the 70s, which I personally reviewed.    Physical Exam   Vitals:   07/05/20 1718 07/06/20 0021 07/06/20 0212 07/06/20 0522  BP: 105/63  (!) 127/53 (!) (P) 139/46  Pulse: (!) 53 63  (!) (P) 57  Resp: 18 18  (P) 18  Temp: 98.9 F (37.2 C) 99 F (37.2 C)  (P) 98.2 F (36.8 C)  TempSrc: Oral  Oral  (P) Oral  SpO2: 98% 97%  (P) 97%  Weight:      Height:        Intake/Output Summary (Last 24 hours) at 07/06/2020 0937 Last data filed at 07/06/2020 0300 Gross per 24 hour  Intake 1116.37 ml  Output --  Net 1116.37 ml    Last 3 Weights 07/03/2020 03/21/2020 05/10/2019  Weight (lbs) 197 lb 204 lb 198 lb 12.8 oz  Weight (kg) 89.359 kg 92.534 kg 90.175 kg    Body mass index is 31.8 kg/m.  General: Well nourished, well developed, in no acute distress Head: Atraumatic, normal size  Eyes: PEERLA, EOMI  Neck: Supple, no JVD Endocrine: No thryomegaly Cardiac: Normal S1, S2; RRR; no murmurs, rubs, or gallops Lungs: Clear to auscultation bilaterally, no wheezing, rhonchi or rales  Abd: Soft, nontender, no hepatomegaly  Ext: No edema, pulses 2+ Musculoskeletal: No deformities, BUE and BLE strength normal and equal Skin: Warm and dry, no rashes   Neuro: Alert and oriented to person, place, time, and situation, CNII-XII grossly intact, no focal deficits  Psych: Normal mood and affect   Labs  High Sensitivity Troponin:   Recent Labs  Lab 07/03/20 2026 07/04/20 0015 07/04/20 1107  TROPONINIHS 17 19* 20*     Cardiac EnzymesNo results for input(s): TROPONINI in the last 168 hours. No results for input(s): TROPIPOC in the last 168 hours.  Chemistry Recent Labs  Lab 07/03/20 2026 07/04/20 1107 07/05/20 0316 07/05/20 1509 07/06/20 0248  NA 134* 133* 131*  --  132*  K 4.8 4.4 4.8  --  4.2  CL 102 103 97*  --  100  CO2 25 23 26   --  25  GLUCOSE 163* 109* 119*  --  143*  BUN 24* 24* 21  --  24*  CREATININE 1.28* 1.14 1.29*  --  1.28*  CALCIUM 8.6* 8.4* 8.7*  --  8.3*  PROT 5.9*  --   --  5.1*  --   ALBUMIN 3.4*  --   --  2.9*  --   AST 36  --   --  46*  --   ALT 30  --   --  42  --   ALKPHOS 53  --   --  87  --   BILITOT 0.5  --   --  0.9  --   GFRNONAA 55* >60 55*  --  55*  ANIONGAP 7 7 8   --  7    Hematology Recent Labs  Lab 07/04/20 1107 07/05/20 0316 07/06/20 0248   WBC 2.4* 2.9* 3.0*  RBC 3.58* 3.78* 3.39*  HGB 11.3* 11.7* 10.3*  HCT 33.1* 34.8* 30.8*  MCV 92.5 92.1 90.9  MCH 31.6 31.0 30.4  MCHC 34.1 33.6 33.4  RDW 13.3 13.2 13.5  PLT 82* 79* 80*   BNP Recent Labs  Lab 07/03/20 2026  BNP 141.6*    DDimer  Recent Labs  Lab 07/04/20 1107  DDIMER 3.37*     Radiology  CT Angio Chest Pulmonary Embolism (PE) W or WO Contrast  Result Date: 07/04/2020 CLINICAL DATA:  PE suspected, low/intermediate prob, positive D-dimer Shortness of breath. EXAM: CT ANGIOGRAPHY CHEST WITH CONTRAST TECHNIQUE: Multidetector CT imaging of the chest was performed using the standard protocol during bolus administration of intravenous contrast. Multiplanar CT image reconstructions and MIPs were obtained to evaluate the vascular anatomy. CONTRAST:  51mL OMNIPAQUE IOHEXOL 350 MG/ML SOLN COMPARISON:  Radiograph yesterday. FINDINGS: Cardiovascular: There are no filling defects within the pulmonary arteries to suggest pulmonary embolus. Aortic atherosclerosis without dissection or acute aortic findings. Coronary artery calcifications. No pericardial effusion. Mediastinum/Nodes: No mediastinal or hilar adenopathy. No thyroid nodule. Decompressed esophagus. Lungs/Pleura: Incidental azygos fissure. No acute or focal airspace disease. No pleural fluid. No pulmonary edema. No pulmonary nodule or mass. Trachea and central bronchi are patent. Upper Abdomen: No acute or unexpected findings. Musculoskeletal: Thoracic spondylosis. There are no acute or suspicious osseous abnormalities. Mild degenerative change of the shoulders. Review of the MIP images confirms the above findings. IMPRESSION: 1. No pulmonary embolus or acute intrathoracic abnormality. 2. Coronary artery calcifications.  Aortic atherosclerosis. Aortic Atherosclerosis (ICD10-I70.0). Electronically Signed   By: Keith Rake M.D.   On: 07/04/2020 17:58   ECHOCARDIOGRAM COMPLETE  Result Date: 07/04/2020    ECHOCARDIOGRAM  REPORT   Patient Name:   [name] Ray Date of Exam: 07/04/2020 Medical Rec #:  [id number]       Height:       66.0 in Accession #:    [id number]      Weight:       197.0 lb Date of Birth:  05/04/1933        BSA:          1.987 m Patient Age:    4 years        BP:           151/62 mmHg Patient Gender: M               HR:           69 bpm. Exam Location:  Inpatient Procedure: 2D Echo, Color Doppler and Cardiac Doppler Indications:    R07.9* Chest pain, unspecified  History:        Patient has prior history of Echocardiogram examinations, most                 recent 05/06/2014.  Sonographer:    Merrie Roof RDCS Referring Phys: [id number] [place]  1. Left ventricular ejection fraction, by estimation, is 55 to 60%. The left ventricle has normal function. The left ventricle has no regional wall motion abnormalities. There is mild left ventricular hypertrophy. Left ventricular diastolic parameters are consistent with Grade I diastolic dysfunction (impaired relaxation).  2. Right ventricular systolic function is normal. The right ventricular size is normal.  3. The mitral valve is normal in structure. No evidence of mitral valve regurgitation. No evidence of mitral stenosis.  4. The aortic valve is tricuspid. Aortic valve regurgitation is not visualized. No aortic stenosis is present. FINDINGS  Left Ventricle: Left ventricular ejection fraction, by estimation, is 55 to 60%. The left ventricle has normal function. The left ventricle has no regional wall motion abnormalities. The left ventricular internal cavity size was normal in size. There is  mild left ventricular hypertrophy. Left ventricular diastolic parameters are consistent with Grade I diastolic dysfunction (impaired relaxation). Right Ventricle: The right ventricular size is normal. Right ventricular systolic function is normal. Left Atrium: Left atrial size was normal in size. Right Atrium: Right atrial size was normal in size. Pericardium: There  is no evidence of pericardial effusion. Mitral Valve: The mitral valve is normal in structure. No evidence of mitral valve regurgitation. No evidence of mitral valve stenosis. Tricuspid Valve: The tricuspid valve is normal in structure. Tricuspid valve regurgitation is trivial. No evidence of tricuspid stenosis. Aortic Valve: The aortic valve is tricuspid. Aortic valve regurgitation is not visualized. No aortic stenosis is present. Aortic valve mean gradient measures 7.0 mmHg. Aortic valve peak gradient measures 10.8 mmHg. Aortic valve area, by VTI measures 1.95  cm. Pulmonic Valve: The pulmonic valve was normal in structure. Pulmonic valve regurgitation is not visualized. No evidence of pulmonic stenosis. Aorta: The aortic root is normal in size and structure. Venous: The inferior vena cava was not well visualized. IAS/Shunts: The interatrial septum was not well visualized.  LEFT VENTRICLE PLAX 2D LVIDd:         4.10 cm  Diastology LVIDs:         2.80 cm  LV e' medial:    7.07 cm/s LV PW:         1.20 cm  LV E/e' medial:  9.5 LV IVS:        1.10 cm  LV e' lateral:   6.64 cm/s LVOT diam:     2.00 cm  LV E/e' lateral: 10.2 LV SV:         73 LV SV Index:   37 LVOT Area:     3.14 cm  RIGHT VENTRICLE RV Basal diam:  4.20 cm RV Mid diam:  3.80 cm LEFT ATRIUM             Index       RIGHT ATRIUM           Index LA diam:        4.30 cm 2.16 cm/m  RA Area:     19.00 cm LA Vol (A2C):   61.7 ml 31.05 ml/m RA Volume:   45.50 ml  22.90 ml/m LA Vol (A4C):   51.2 ml 25.77 ml/m LA Biplane Vol: 55.7 ml 28.03 ml/m  AORTIC VALVE AV Area (Vmax):    1.91 cm AV Area (Vmean):   1.81 cm AV Area (VTI):     1.95 cm AV Vmax:           164.00 cm/s AV Vmean:          125.000 cm/s AV VTI:            0.374 m AV Peak Grad:      10.8 mmHg AV Mean Grad:      7.0 mmHg LVOT Vmax:         99.80 cm/s LVOT Vmean:        72.100 cm/s LVOT VTI:          0.232 m LVOT/AV VTI ratio: 0.62  AORTA Ao Root diam: 3.50 cm Ao Asc diam:  3.00 cm MITRAL  VALVE MV Area (PHT): 2.66 cm     SHUNTS MV Decel Time: 285 msec     Systemic VTI:  0.23 m MV E velocity: 67.40 cm/s   Systemic Diam: 2.00 cm MV A velocity: 123.00 cm/s MV E/A ratio:  0.55 Kirk Ruths MD Electronically signed by Kirk Ruths MD Signature Date/Time: 07/04/2020/2:58:23 PM    Final    VAS [place] LOWER EXTREMITY VENOUS (DVT)  Result Date: 07/05/2020  Lower Venous DVT Study Patient Name:  ABSALOM ARO  Date of Exam:   07/05/2020 Medical Rec #: [id number]        Accession #:    [id number] Date of Birth: [birth year]         Patient Gender: M Patient Age:   82Y Exam Location[facility]  [facility] Procedure:      VAS [place] LOWER EXTREMITY VENOUS (DVT) Referring Phys: [id number] RONDELL A SMITH --------------------------------------------------------------------------------  Indications: Chest pain/dizziness & elevated D-dimer.  Comparison Study: No previous exams Performing Technologist: Jody Hill RVT, RDMS  Examination Guidelines: A complete evaluation includes B-mode imaging, spectral Doppler, color Doppler, and power Doppler as needed of all accessible portions of each vessel. Bilateral testing is considered an integral part of a complete examination. Limited examinations for reoccurring indications may be performed as noted. The reflux portion of the exam is performed with the patient in reverse Trendelenburg.  +---------+---------------+---------+-----------+----------+--------------+ RIGHT    CompressibilityPhasicitySpontaneityPropertiesThrombus Aging +---------+---------------+---------+-----------+----------+--------------+ CFV      Full           Yes      Yes                                 +---------+---------------+---------+-----------+----------+--------------+ SFJ      Full                                                        +---------+---------------+---------+-----------+----------+--------------+  FV Prox  Full           Yes      Yes                                  +---------+---------------+---------+-----------+----------+--------------+ FV Mid   Full           Yes      Yes                                 +---------+---------------+---------+-----------+----------+--------------+ FV DistalFull           Yes      Yes                                 +---------+---------------+---------+-----------+----------+--------------+ PFV      Full                                                        +---------+---------------+---------+-----------+----------+--------------+ POP      Full           Yes      Yes                                 +---------+---------------+---------+-----------+----------+--------------+ PTV      Full                                                        +---------+---------------+---------+-----------+----------+--------------+ PERO     Full                                                        +---------+---------------+---------+-----------+----------+--------------+   +---------+---------------+---------+-----------+----------+--------------+ LEFT     CompressibilityPhasicitySpontaneityPropertiesThrombus Aging +---------+---------------+---------+-----------+----------+--------------+ CFV      Full           Yes      Yes                                 +---------+---------------+---------+-----------+----------+--------------+ SFJ      Full                                                        +---------+---------------+---------+-----------+----------+--------------+ FV Prox  Full           Yes      Yes                                 +---------+---------------+---------+-----------+----------+--------------+ FV Mid   Full  Yes      Yes                                 +---------+---------------+---------+-----------+----------+--------------+ FV DistalFull           Yes      Yes                                  +---------+---------------+---------+-----------+----------+--------------+ PFV      Full                                                        +---------+---------------+---------+-----------+----------+--------------+ POP      Full           Yes      Yes                                 +---------+---------------+---------+-----------+----------+--------------+ PTV      Full                                                        +---------+---------------+---------+-----------+----------+--------------+ PERO     Full                                                        +---------+---------------+---------+-----------+----------+--------------+    Summary: BILATERAL: - No evidence of deep vein thrombosis seen in the lower extremities, bilaterally. - No evidence of superficial venous thrombosis in the lower extremities, bilaterally. -No evidence of popliteal cyst, bilaterally.   *See table(s) above for measurements and observations.    Preliminary     Cardiac Studies  TTE 07/04/2020  1. Left ventricular ejection fraction, by estimation, is 55 to 60%. The  left ventricle has normal function. The left ventricle has no regional  wall motion abnormalities. There is mild left ventricular hypertrophy.  Left ventricular diastolic parameters  are consistent with Grade I diastolic dysfunction (impaired relaxation).   2. Right ventricular systolic function is normal. The right ventricular  size is normal.   3. The mitral valve is normal in structure. No evidence of mitral valve  regurgitation. No evidence of mitral stenosis.   4. The aortic valve is tricuspid. Aortic valve regurgitation is not  visualized. No aortic stenosis is present.   Patient Profile  87 year old male with history of CAD status post PCI, diabetes, CKD stage II, acid reflux, carotid artery disease who was admitted on 07/04/2020 for dizziness and chest pain.   Assessment & Plan   Dizziness -Chronic and  associated with head movement.  Suspect this is BPPV. -Would recommend continue meclizine. -Echo normal.  2.  Chest pain, noncardiac -Troponins negative.  EKG normal.  Echocardiogram without wall motion abnormality.  Symptoms are described as burning in the center of her chest and nonexertional.  Not alleviated by rest.  Improved with omeprazole. -Suspect this is acid reflux. -We will discharge home on Protonix. -Can consider outpatient stress test.  3.  Weakness/fatigue/low-grade fever/leukopenia/thrombocytopenia -Started on doxycycline by ID.  Follow-up on peripheral smear.  Could be related to a tickborne illness. -Platelets have remained stable.  Okay to continue Plavix at discharge.  [facility] will sign off.   Medication Recommendations: Medications as above Other recommendations (labs, testing, etc): None Follow up as an outpatient: We will arrange hospital follow-up in 3 to 4 weeks  For questions or updates, please contact [place] Please consult [email or web address] for contact info under   Time Spent with Patient: I have spent a total of 25 minutes with patient reviewing hospital notes, telemetry, EKGs, labs and examining the patient as well as establishing an assessment and plan that was discussed with the patient.  > 50% of time was spent in direct patient care.    Signed, Addison Naegeli. Audie Box, MD, [place]  07/06/2020 9:37 AM

## 2020-07-06 NOTE — TOC Initial Note (Addendum)
Transition of Care [facility]) - Initial/Assessment Note    Patient Details  Name: [name] Ray MRN: [id number] Date of Birth: 07/29/1933  Transition [facility]) CM/SW Contact:    Marilu Favre, RN Phone Number: 07/06/2020, 10:57 AM  Clinical Narrative:                  Patient from home with wife. His son in Sports coach owns [employer] at [facility]. Patient would like to go there for OP PT. NCM called [facility] [phone] and left voicemail, awaiting call back.   Sloan from  [place] [facility]. Just returned call and left voicemail he needs a prescription and clinicals faxed to [phone], NCM called back got voicemail asked if they have a referral form to fax for MD to sign since patient already discharge and NCM cannot enter orders after discharge. MD aware he will look for a blank prescription and fax Expected Discharge Plan: Home/Self Care Barriers to Discharge: No Barriers Identified   Patient Goals and CMS Choice Patient states their goals for this hospitalization and ongoing recovery are:: to return to home CMS Medicare.gov Compare Post Acute Care list provided to:: Patient Choice offered to / list presented to : Patient  Expected Discharge Plan and Services Expected Discharge Plan: Home/Self Care       Living arrangements for the past 2 months: Single Family Home Expected Discharge Date: 07/06/20               DME Arranged: N/A DME Agency: NA       HH Arranged: NA          Prior Living Arrangements/Services Living arrangements for the past 2 months: Single Family Home Lives with:: Spouse Patient language and need for interpreter reviewed:: Yes Do you feel safe going back to the place where you live?: Yes      Need for Family Participation in Patient Care: Yes (Comment) Care giver support system in place?: Yes (comment)   Criminal Activity/Legal Involvement Pertinent to Current Situation/Hospitalization:  No - Comment as needed  Activities of Daily Living Home Assistive Devices/Equipment: None ADL Screening (condition at time of admission) Patient's cognitive ability adequate to safely complete daily activities?: Yes Is the patient deaf or have difficulty hearing?: No Does the patient have difficulty seeing, even when wearing glasses/contacts?: No Does the patient have difficulty concentrating, remembering, or making decisions?: No Patient able to express need for assistance with ADLs?: Yes Does the patient have difficulty dressing or bathing?: No Independently performs ADLs?: Yes (appropriate for developmental age) Does the patient have difficulty walking or climbing stairs?: No Weakness of Legs: None Weakness of Arms/Hands: None  Permission Sought/Granted   Permission granted to share information with : No              Emotional Assessment Appearance:: Appears stated age Attitude/Demeanor/Rapport: Engaged Affect (typically observed): Accepting Orientation: : Oriented to Self, Oriented to Place, Oriented to  Time, Oriented to Situation Alcohol / Substance Use: Not Applicable Psych Involvement: No (comment)  Admission diagnosis:  Chest pain [R07.9] Chest pain, unspecified type [R07.9] Patient Active Problem List   Diagnosis Date Noted   Pancytopenia ([place])    Diarrhea    Tick bite    Chest pain 07/04/2020   SIRS (systemic inflammatory response syndrome) ([place]) 07/04/2020   Renal insufficiency 07/04/2020   BPH (benign prostatic hyperplasia) 07/04/2020   OA (osteoarthritis) of hip 01/26/2018  Spinal stenosis of lumbar region with neurogenic claudication 09/11/2017   Dizziness 05/04/2014   Ejection fraction    S/P total knee arthroplasty 05/24/2013   Carotid artery disease (HCC)    Coronary artery disease    Hyperlipidemia    GERD (gastroesophageal reflux disease)    Hypertension    Numbness and tingling in hands    FECAL OCCULT BLOOD 10/19/2009   COLONIC POLYPS,  ADENOMATOUS, HX OF 10/19/2009   OVERWEIGHT 11/08/2008   Type 2 diabetes mellitus with hyperlipidemia ([place]) 10/25/2008   PCP:  Haywood Pao, MD Pharmacy:   [place], [place] - [address] [address] Phone: [phone] Fax: [phone]     Social Determinants of Health (SDOH) Interventions    Readmission Risk Interventions No flowsheet data found.

## 2020-07-06 NOTE — Progress Notes (Addendum)
Physical Therapy Treatment Patient Details Name: [name] Ray MRN: [id number] DOB: 04/10/1933 Today's Date: 07/06/2020    History of Present Illness 87 y.o. male admitted on 07/03/20 for dizziness and chest pain.  Pt dx with atypical chest pain, CAD, fever and chills in the setting of recent tick bite.  Infections disease consulted for tick bite, pancytopenia.  Pt with significant PMH of B TKA, CAD, HTN, MI, numbness in R hand s/p carpal tunnel surgery, prostate CA, DM2, SOB, lumbar surgery, L THA.    PT Comments    Pt very pleasant and agreeable to therapy this morning. Pt indicated to therapist that although he has 5 steps to enter his home on his preferred entrance, he does have a ramp that he can use as well. Pt is mod I for bed mobility, supervision for transfers and contact guard assist for safety with gait. Pt ambulated better with shoes donned today and was able to ambulate 300 feet without AD with only 1 LoB which he was able to self correct. Encouraged pt to keep mobilizing at home. Pt will follow up with [facility] to regain strength and balance. Plans for discharge this afternoon.    Follow Up Recommendations  Outpatient PT     Equipment Recommendations  None recommended by PT       Precautions / Restrictions Precautions Precautions: Fall Restrictions Weight Bearing Restrictions: No    Mobility  Bed Mobility Overal bed mobility: Modified Independent             General bed mobility comments: sitting EOB on entry    Transfers Overall transfer level: Needs assistance Equipment used: None Transfers: Sit to/from Stand Sit to Stand: Supervision         General transfer comment: no physical assist, reports he usually stands a little while before walking to make sure he is steady, PT endorsed wise idea  Ambulation/Gait Ambulation/Gait assistance: Min assist Gait Distance (Feet): 300 Feet Assistive device: None Gait Pattern/deviations: Step-through  pattern;Staggering left;Staggering right Gait velocity: WFL Gait velocity interpretation: 1.31 - 2.62 ft/sec, indicative of limited community ambulator General Gait Details: Had pt don shoes prior to ambulation, provided contact guard assist for safety, pt generally steady, 1x minor LoB to L which pt was able to self correct       Balance Overall balance assessment: Mild deficits observed, not formally tested                                          Cognition Arousal/Alertness: Awake/alert Behavior During Therapy: WFL for tasks assessed/performed Overall Cognitive Status: Within Functional Limits for tasks assessed                                 General Comments: very jovial, sometimes needs redirection to task at hand      Exercises Hand Exercises Digit Composite Flexion: AROM;Right;5 reps Composite Extension: AROM;Right;5 reps Digit Composite Abduction: AROM;Right;5 reps Digit Composite Adduction: AROM;Right;5 reps Thumb Abduction: AROM;Right;5 reps Thumb Adduction: AROM;Right;5 reps Opposition: AROM;Right;5 reps Opposition Limitations: difficulty reaching 3-5th digits    General Comments General comments (skin integrity, edema, etc.): max HR noted in low 70s      Pertinent Vitals/Pain Pain Assessment: No/denies pain    Home Living Family/patient expects to be discharged to:: Private residence Living Arrangements: Spouse/significant other  Available Help at Discharge: Family;Available 24 hours/day Type of Home: House Home Access: Stairs to enter;Ramped entrance (has ramp off of driveway) Entrance Stairs-Rails: Right Home Layout: Two level;Laundry or work area in [place]: Environmental consultant - 2 wheels;Cane - single point;Shower seat;Hand held shower head;Adaptive equipment;Grab bars - tub/shower;Youth worker - 4 wheels Additional Comments: has a lot of equipment that was his late brother in [place]    Prior Function Level  of Independence: Independent      Comments: Pt drives, retired Database administrator. Uses walking stick occasionally outside in the yard. Independent with all ADLs, IADLs. Uses sock aide to don socks due to decreased R hand coordination/sensation, able to doff socks without difficulty   PT Goals (current goals can now be found in the care plan section) Acute Rehab PT Goals Patient Stated Goal: to go home tomorrow PT Goal Formulation: With patient Time For Goal Achievement: 07/20/20 Potential to Achieve Goals: Good Progress towards PT goals: Progressing toward goals    Frequency    Min 3X/week      PT Plan Discharge plan needs to be updated       AM-PAC PT "6 Clicks" Mobility   Outcome Measure  Help needed turning from your back to your side while in a flat bed without using bedrails?: None Help needed moving from lying on your back to sitting on the side of a flat bed without using bedrails?: None Help needed moving to and from a bed to a chair (including a wheelchair)?: None Help needed standing up from a chair using your arms (e.g., wheelchair or bedside chair)?: A Little Help needed to walk in hospital room?: A Little Help needed climbing 3-5 steps with a railing? : A Little 6 Click Score: 21    End of Session Equipment Utilized During Treatment: Gait belt Activity Tolerance: Patient tolerated treatment well Patient left: in bed;Other (comment) (seated EOB with teaching service in room)   PT Visit Diagnosis: Muscle weakness (generalized) (M62.81);Dizziness and giddiness (R42);Difficulty in walking, not elsewhere classified (R26.2)     Time: 0973-5329 PT Time Calculation (min) (ACUTE ONLY): 21 min  Charges:  $Therapeutic Exercise: 8-22 mins                     Lorilyn Laitinen B. Migdalia Dk PT, DPT Acute Rehabilitation Services Pager [phone] Office [phone]    [place] 07/06/2020, 11:09 AM

## 2020-07-06 NOTE — Evaluation (Addendum)
Occupational Therapy Evaluation/Discharge Patient Details Name: [name] Ray MRN: [id number] DOB: [birth year] Today's Date: 07/06/2020    History of Present Illness 87 y.o. male admitted on 07/03/20 for dizziness and chest pain.  Pt dx with atypical chest pain, CAD, fever and chills in the setting of recent tick bite.  Infections disease consulted for tick bite, pancytopenia.  Pt with significant PMH of B TKA, CAD, HTN, MI, numbness in R hand s/p carpal tunnel surgery, prostate CA, DM2, SOB, lumbar surgery, L THA.   Clinical Impression   PTA, pt lives with spouse and reports very active, independent with all daily tasks and intermittent use of walking stick outside of the home. Pt presents now with minor deficits in dynamic standing balance (though improving) and decreased R hand coordination/sensation from recent carpal tunnel sx. Pt reports going to bathroom unassisted today and able to demo mobility without DME though occasionally reaching out for support. Pt independent with ADLs assessed today, uses AE for LB dressing at home. Pt denied dizziness with activity. Collaborated with pt on R hand exercises/activities with pt able to return demo well. Anticipate quick return to PLOF and encouraged pt to follow-up with MD for beginning outpatient therapy with a hand specialist to maximize R hand function. No further skilled OT services needed at the acute level, so will sign off and defer needs to OP OT.     Follow Up Recommendations  Outpatient OT;Other (comment) (follow-up with hand specialist for R hand)    Equipment Recommendations  None recommended by OT    Recommendations for Other Services       Precautions / Restrictions Precautions Precautions: Fall Restrictions Weight Bearing Restrictions: No      Mobility Bed Mobility               General bed mobility comments: sitting EOB on entry    Transfers Overall transfer level: Independent Equipment used: None Transfers:  Sit to/from [employer] transfer comment: no assist for sit to stand, no reports of dizziness    Balance Overall balance assessment: Mild deficits observed, not formally tested                                         ADL either performed or assessed with clinical judgement   ADL Overall ADL's : Modified independent                                       General ADL Comments: Pt able to stand at sink to brush teeth, open toothpaste despite R hand deficits. Pt able to bend to feet to reach socks, can doff socks but uses sock aid at baseline to don socks. Reports ambulating to bathroom with intermittent IV pole support and without assist today     Vision Baseline Vision/History: Wears glasses Wears Glasses: At all times Patient Visual Report: No change from baseline Vision Assessment?: No apparent visual deficits     Perception     Praxis      Pertinent Vitals/Pain Pain Assessment: No/denies pain     Hand Dominance Right   Extremity/Trunk Assessment Upper Extremity Assessment Upper Extremity Assessment: RUE deficits/detail RUE Deficits / Details: pt with h/o R hand numbness/stiffness, has has carpal tunnel on this side  and bil hands arthritic R>L. Difficulty with opposition, making fist. drops items if not looking when holding them RUE Sensation: decreased light touch;decreased proprioception RUE Coordination: decreased fine motor   Lower Extremity Assessment Lower Extremity Assessment: Defer to PT evaluation   Cervical / Trunk Assessment Cervical / Trunk Assessment: Other exceptions Cervical / Trunk Exceptions: h/o lumbar spine surgery   Communication Communication Communication: No difficulties   Cognition Arousal/Alertness: Awake/alert Behavior During Therapy: WFL for tasks assessed/performed Overall Cognitive Status: Within Functional Limits for tasks assessed                                      General Comments  HR 60s-70s. Educated and collaborated on hand exercises, activities to improve fine motor coordination with pt already completing many at home (clothespins, rubber bands, etc) due to hx of hand specialist rehab for L carpal tunnel sx.    Exercises Exercises: Hand exercises;Hand activities Hand Exercises Digit Composite Flexion: AROM;Right;5 reps Composite Extension: AROM;Right;5 reps Digit Composite Abduction: AROM;Right;5 reps Digit Composite Adduction: AROM;Right;5 reps Thumb Abduction: AROM;Right;5 reps Thumb Adduction: AROM;Right;5 reps Opposition: AROM;Right;5 reps Opposition Limitations: difficulty reaching 3-5th digits   Shoulder Instructions      Home Living Family/patient expects to be discharged to:: Private residence Living Arrangements: Spouse/significant other Available Help at Discharge: Family;Available 24 hours/day Type of Home: House Home Access: Stairs to enter;Ramped entrance (has ramp off of driveway) Entrance Stairs-Number of Steps: 5 Entrance Stairs-Rails: Right Home Layout: Two level;Laundry or work area in basement Alternate Therapist, sports of Steps: flight Alternate Level Stairs-Rails: Right;Left Bathroom Shower/Tub: Occupational psychologist: Handicapped height (toilet riser over one, handicapped height in another bathroom) Bathroom Accessibility: Yes   Home Equipment: Walker - 2 wheels;Cane - single point;Shower seat;Hand held shower head;Adaptive equipment;Grab bars - tub/shower;Youth worker - 4 wheels Adaptive Equipment: Sock aid;Long-handled Conservation officer, historic buildings Additional Comments: has a lot of equipment that was his late brother in [place]      Prior Functioning/Environment Level of Independence: Independent        Comments: Pt drives, retired Database administrator. Uses walking stick occasionally outside in the yard. Independent with all ADLs, IADLs. Uses sock aide to don socks due to decreased R hand  coordination/sensation, able to doff socks without difficulty        OT Problem List: Decreased strength;Decreased range of motion;Impaired balance (sitting and/or standing);Decreased coordination;Impaired sensation      OT Treatment/Interventions:      OT Goals(Current goals can be found in the care plan section) Acute Rehab OT Goals Patient Stated Goal: go home soon, improve R hand function OT Goal Formulation: All assessment and education complete, DC therapy  OT Frequency:     Barriers to D/C:            Co-evaluation              AM-PAC OT "6 Clicks" Daily Activity     Outcome Measure Help from another person eating meals?: None Help from another person taking care of personal grooming?: None Help from another person toileting, which includes using toliet, bedpan, or urinal?: None Help from another person bathing (including washing, rinsing, drying)?: None Help from another person to put on and taking off regular upper body clothing?: None Help from another person to put on and taking off regular lower body clothing?: None (with AE) 6 Click Score: 24  End of Session    Activity Tolerance: Patient tolerated treatment well Patient left: in bed;with call bell/phone within reach  OT Visit Diagnosis: Unsteadiness on feet (R26.81);Muscle weakness (generalized) (M62.81)                Time: 2241-1464 OT Time Calculation (min): 32 min Charges:  OT General Charges $OT Visit: 1 Visit OT Evaluation $OT Eval Low Complexity: 1 Low OT Treatments $Self Care/Home Management : 8-22 mins  [name] Ray, OTR/L Acute Rehab Services Office: [phone]   [name] Ray 07/06/2020, 8:56 AM

## 2020-07-06 NOTE — Progress Notes (Signed)
F/u arranged with PA Kathlen Mody 8/3, info placed on AVS, confirmed with nurse that AVS not yet printed.

## 2020-07-06 NOTE — Progress Notes (Signed)
[facility]  Date of Admission:  07/03/2020     CC: Sepsis Tick bite  Lines: peripheral  Abx: 6/22-c doxy  ASSESSMENT: 87 yo male with tick bites and fever/thrombocytopenia/diarrhea which had resolved/improved. Tick serology pending. Lft's never abnormal vs checked late in course of disease  Clincially appearing well  PLAN: Finish 7 day empiric doxy Will follow labs and advise him of anything that needs to retouch base on   Principal Problem:   Chest pain Active Problems:   Type 2 diabetes mellitus with hyperlipidemia (HCC)   Coronary artery disease   GERD (gastroesophageal reflux disease)   Hypertension   Dizziness   SIRS (systemic inflammatory response syndrome) (HCC)   Renal insufficiency   BPH (benign prostatic hyperplasia)   Pancytopenia (HCC)   Diarrhea   Tick bite   Allergies  Allergen Reactions   Atorvastatin Other (See Comments)    SEVERE LEG PAIN Can not walk Sever leg pain Can not walk   Rosuvastatin Other (See Comments)    JOINT PAIN  Makes knees hurt  REACTION: unknown Makes knees hurt    Clindamycin Rash    PATIENT REFUSES    Scheduled Meds:  amLODipine  5 mg Oral Daily   clopidogrel  75 mg Oral Daily   doxycycline  100 mg Oral Q12H   enoxaparin (LOVENOX) injection  40 mg Subcutaneous Q24H   linagliptin  2.5 mg Oral Q breakfast   And   metFORMIN  1,000 mg Oral Q breakfast   pantoprazole  40 mg Oral Daily   ramipril  10 mg Oral QPC breakfast   simvastatin  20 mg Oral QHS   tamsulosin  0.4 mg Oral QHS   Continuous Infusions:  dextrose 5 % and 0.9% NaCl 75 mL/hr at 07/05/20 1826   PRN Meds:.acetaminophen, alum & mag hydroxide-simeth, loperamide, loratadine, nitroGLYCERIN, ondansetron (ZOFRAN) IV   SUBJECTIVE: Feels well, wants to go Platelet stable No f/c No rash No further diarrhea  Review of Systems: ROS All other ROS was negative, except mentioned  above     OBJECTIVE: Vitals:   07/05/20 1718 07/06/20 0021 07/06/20 0212 07/06/20 0522  BP: 105/63  (!) 127/53 (!) (P) 139/46  Pulse: (!) 53 63  (!) (P) 57  Resp: 18 18  (P) 18  Temp: 98.9 F (37.2 C) 99 F (37.2 C)  (P) 98.2 F (36.8 C)  TempSrc: Oral Oral  (P) Oral  SpO2: 98% 97%  (P) 97%  Weight:      Height:       Body mass index is 31.8 kg/m.  Physical Exam General/constitutional: no distress, pleasant HEENT: Normocephalic, PER, Conj Clear, EOMI, Oropharynx clear Neck supple CV: rrr no mrg Lungs: clear to auscultation, normal respiratory effort Abd: Soft, Nontender Ext: no edema Skin: No Rash Neuro: nonfocal MSK: no peripheral joint swelling/tenderness/warmth; back spines nontender    Lab Results Lab Results  Component Value Date   WBC 3.0 (L) 07/06/2020   HGB 10.3 (L) 07/06/2020   HCT 30.8 (L) 07/06/2020   MCV 90.9 07/06/2020   PLT 80 (L) 07/06/2020    Lab Results  Component Value Date   CREATININE 1.28 (H) 07/06/2020   BUN 24 (H) 07/06/2020   NA 132 (L) 07/06/2020   K 4.2 07/06/2020   CL 100 07/06/2020   CO2 25 07/06/2020    Lab Results  Component Value Date   ALT 42 07/05/2020   AST 46 (H) 07/05/2020  ALKPHOS 87 07/05/2020   BILITOT 0.9 07/05/2020      Microbiology: Recent Results (from the past 240 hour(s))  Resp Panel by RT-PCR (Flu A&B, Covid) Nasopharyngeal Swab     Status: None   Collection Time: 07/04/20  7:04 AM   Specimen: Nasopharyngeal Swab; Nasopharyngeal(NP) swabs in vial transport medium  Result Value Ref Range Status   SARS Coronavirus 2 by RT PCR NEGATIVE NEGATIVE Final    Comment: (NOTE) SARS-CoV-2 target nucleic acids are NOT DETECTED.  The SARS-CoV-2 RNA is generally detectable in upper respiratory specimens during the acute phase of infection. The lowest concentration of SARS-CoV-2 viral copies this assay can detect is 138 copies/mL. A negative result does not preclude SARS-Cov-2 infection and should not be used  as the sole basis for treatment or other patient management decisions. A negative result may occur with  improper specimen collection/handling, submission of specimen other than nasopharyngeal swab, presence of viral mutation(s) within the areas targeted by this assay, and inadequate number of viral copies(<138 copies/mL). A negative result must be combined with clinical observations, patient history, and epidemiological information. The expected result is Negative.  Fact Sheet for Patients:  EntrepreneurPulse.com.au  Fact Sheet for Healthcare Providers:  IncredibleEmployment.be  This test is no t yet approved or cleared by the [place] FDA and  has been authorized for detection and/or diagnosis of SARS-CoV-2 by FDA under an Emergency Use Authorization (EUA). This EUA will remain  in effect (meaning this test can be used) for the duration of the COVID-19 declaration under Section 564(b)(1) of the Act, 21 U.S.C.section 360bbb-3(b)(1), unless the authorization is terminated  or revoked sooner.       Influenza A by PCR NEGATIVE NEGATIVE Final   Influenza B by PCR NEGATIVE NEGATIVE Final    Comment: (NOTE) The Xpert Xpress SARS-CoV-2/FLU/RSV plus assay is intended as an aid in the diagnosis of influenza from Nasopharyngeal swab specimens and should not be used as a sole basis for treatment. Nasal washings and aspirates are unacceptable for Xpert Xpress SARS-CoV-2/FLU/RSV testing.  Fact Sheet for Patients: EntrepreneurPulse.com.au  Fact Sheet for Healthcare Providers: IncredibleEmployment.be  This test is not yet approved or cleared by the [place] FDA and has been authorized for detection and/or diagnosis of SARS-CoV-2 by FDA under an Emergency Use Authorization (EUA). This EUA will remain in effect (meaning this test can be used) for the duration of the COVID-19 declaration under Section 564(b)(1)  of the Act, 21 U.S.C. section 360bbb-3(b)(1), unless the authorization is terminated or revoked.  Performed at [facility], [place] [address], [place], [place] [address]   Culture, blood (routine x 2)     Status: None (Preliminary result)   Collection Time: 07/05/20  3:09 PM   Specimen: BLOOD  Result Value Ref Range Status   Specimen Description BLOOD LEFT ANTECUBITAL  Final   Special Requests   Final    BOTTLES DRAWN AEROBIC AND ANAEROBIC Blood Culture adequate volume   Culture   Final    NO GROWTH < 24 HOURS Performed at [facility], [place] [address], [place], [place] [address]    Report Status PENDING  Incomplete  Culture, blood (routine x 2)     Status: None (Preliminary result)   Collection Time: 07/05/20  3:26 PM   Specimen: BLOOD  Result Value Ref Range Status   Specimen Description BLOOD RIGHT ANTECUBITAL  Final   Special Requests   Final    BOTTLES DRAWN AEROBIC AND ANAEROBIC Blood  Culture results may not be optimal due to an excessive volume of blood received in culture bottles   Culture   Final    NO GROWTH < 24 HOURS Performed at [facility] [address], [place], [place] [address]    Report Status PENDING  Incomplete     Serology:   Imaging: If present, new imagings (plain films, ct scans, and mri) have been personally visualized and interpreted; radiology reports have been reviewed. Decision making incorporated into the Impression / Recommendations.   Jabier Mutton, [facility] [place] [phone] pager    07/06/2020, 11:53 AM

## 2020-07-06 NOTE — Discharge Summary (Addendum)
Physician Discharge Summary  [name] Ray EXH:[id number] DOB: April 04, 1933 DOA: 07/03/2020  PCP: Haywood Pao, MD  Admit date: 07/03/2020 Discharge date: 07/06/2020  Admitted From: Home  Disposition:  Home   Recommendations for Outpatient Follow-up and new medication changes:  Follow up with Dr. Osborne Casco in 7 to 10 days.  Patient will be discharged with Doxycycline for the next 7 days. Follow as outpatient results for Ehrlichia and [place] spotted fever.    I spoke over the phone with the patient's wife about patient's  condition, plan of care, prognosis and all questions were addressed.   Home Health: no   Equipment/Devices: no    Discharge Condition: stable  CODE STATUS: full  Diet recommendation: heart healthy   Brief/Interim Summary: [name] Ray was admitted to the hospital with the working diagnosis of atypical chest pain.   87 year old male past medical history for hypertension, dyslipidemia and coronary artery disease who presents with chest pain and dizziness. Reported 3 days of feeling dizzy when standing.  He had intermittent fever, chills, muscle cramps, malaise, shortness of breath and substernal chest pain.  On his initial physical examination his temperature was 100.2 F, respiratory rate 17-23, heart rate 57-85, blood pressure 169/88, oxygen saturation 96%, lungs clear to auscultation bilaterally, heart S1-S2, present, rhythmic, soft abdomen, no lower extremity edema, no rashes.   Sodium 134, potassium 4.8, chloride 102, bicarb 25, glucose 163, BUN 24, creatinine 1.28, high sensitive troponin 17-19-20.  AST 36, ALT 30.  White cell count 2.8, hemoglobin 12.2, hematocrit 37.6, platelets 104. SARS COVID-19 negative.   Urinalysis specific gravity 1.028, 30 protein, white cells 0-5. Chest radiograph negative for infiltrates.   EKG 87 bpm, left axis deviation, normal intervals, sinus rhythm with PACs, no significant ST segment or T wave changes.  Patient ruled out  for acute coronary syndrome.   At home with multiple tick bites. Considering high risk for tick borne disease placed on empiric doxycycline.  With supportive medical therapy his symptoms have resolved.   Atypical chest pain, CAD and dyslipidemia.  The patient was admitted to the medical ward, he was placed on a remote telemetry monitor.  He ruled out for acute coronary syndrome.  Continue medical therapy for his coronary artery disease with clopidogrel, ramipril and simvastatin.  2.  Fevers, chills, in the setting of recent tick exposure.  With supportive medical therapy his symptoms have improved and patient has remained afebrile. No skin rashes.  Considering his comorbidities and symptoms patient has been started empirically on doxycycline. Pending serologies for [facility] spotted fever and Ehrlichia.  OT recommended outpatient OT.   3.  Obesity class I.  Calculated BMI 31.8, follow-up as an outpatient.  4.  Acute kidney injury on chronic kidney disease stage II, hyponatremia.  Patient received intravenous fluids with isotonic saline, his discharge sodium was 132, potassium 4.2, chloride 100, bicarb 25, glucose 143, BUN 24, creatinine 1.28.  5.  Hypertension.  Continue blood pressure control with amlodipine, at discharge will resume ramipril.   6.  Type 2 diabetes mellitus.  His glucose remained well controlled with linagliptin and metformin.   Discharge Diagnoses:  Principal Problem:   Chest pain Active Problems:   Type 2 diabetes mellitus with hyperlipidemia (HCC)   Coronary artery disease   GERD (gastroesophageal reflux disease)   Hypertension   Dizziness   SIRS (systemic inflammatory response syndrome) (HCC)   Renal insufficiency   BPH (benign prostatic hyperplasia)   Pancytopenia (HCC)   Diarrhea  Tick bite    Discharge Instructions   Allergies as of 07/06/2020       Reactions   Atorvastatin Other (See Comments)   SEVERE LEG PAIN Can not walk Sever leg  pain Can not walk   Rosuvastatin Other (See Comments)   JOINT PAIN  Makes knees hurt  REACTION: unknown Makes knees hurt    Clindamycin Rash   PATIENT REFUSES        Medication List     STOP taking these medications    amoxicillin 500 MG capsule Commonly known as: AMOXIL       TAKE these medications    acetaminophen 500 MG tablet Commonly known as: TYLENOL Take 1 tablet (500 mg total) by mouth every 6 (six) hours as needed for mild pain.   amLODipine 5 MG tablet Commonly known as: NORVASC Take 1 tablet (5 mg total) by mouth daily.   clopidogrel 75 MG tablet Commonly known as: PLAVIX Take 75 mg by mouth daily.   doxycycline 100 MG tablet Commonly known as: VIBRA-TABS Take 1 tablet (100 mg total) by mouth every 12 (twelve) hours for 7 days.   Guaifenesin 1200 MG Tb12 Take 1,200 mg by mouth daily as needed (cough).   Jentadueto XR 2.05-998 MG Tb24 Generic drug: linaGLIPtin-metFORMIN HCl ER Take 1 tablet by mouth daily.   loperamide 2 MG tablet Commonly known as: IMODIUM A-D Take 2 mg by mouth 4 (four) times daily as needed for diarrhea or loose stools.   loratadine 10 MG tablet Commonly known as: CLARITIN Take 10 mg by mouth daily as needed for allergies.   Multi-Vitamin tablet Take 1 tablet by mouth daily.   nitroGLYCERIN 0.4 MG SL tablet Commonly known as: NITROSTAT Place 1 tablet (0.4 mg total) under the tongue every 5 (five) minutes as needed for chest pain. Please keep upcoming appt. Thank you   omeprazole 20 MG capsule Commonly known as: PRILOSEC Take 20 mg by mouth daily.   ramipril 10 MG capsule Commonly known as: ALTACE Take 10 mg by mouth daily after breakfast.   simvastatin 20 MG tablet Commonly known as: ZOCOR Take 20 mg by mouth at bedtime.   tamsulosin 0.4 MG Caps capsule Commonly known as: FLOMAX Take 0.4 mg by mouth at bedtime.        Allergies  Allergen Reactions   Atorvastatin Other (See Comments)    SEVERE LEG  PAIN Can not walk Sever leg pain Can not walk   Rosuvastatin Other (See Comments)    JOINT PAIN  Makes knees hurt  REACTION: unknown Makes knees hurt    Clindamycin Rash    PATIENT REFUSES    Consultations: Cardiology  ID    Procedures/Studies: DG Chest 2 View  Result Date: 07/03/2020 CLINICAL DATA:  Chest pain and shortness of burr EXAM: CHEST - 2 VIEW COMPARISON:  05/14/2013 FINDINGS: Cardiac shadow is within normal limits. Aortic calcifications are seen. Lungs are clear bilaterally. Degenerative change of the thoracic spine is noted. IMPRESSION: No active cardiopulmonary disease. Electronically Signed   By: Inez Catalina M.D.   On: 07/03/2020 20:30   CT Angio Chest Pulmonary Embolism (PE) W or WO Contrast  Result Date: 07/04/2020 CLINICAL DATA:  PE suspected, low/intermediate prob, positive D-dimer Shortness of breath. EXAM: CT ANGIOGRAPHY CHEST WITH CONTRAST TECHNIQUE: Multidetector CT imaging of the chest was performed using the standard protocol during bolus administration of intravenous contrast. Multiplanar CT image reconstructions and MIPs were obtained to evaluate the vascular anatomy. CONTRAST:  20mL  OMNIPAQUE IOHEXOL 350 MG/ML SOLN COMPARISON:  Radiograph yesterday. FINDINGS: Cardiovascular: There are no filling defects within the pulmonary arteries to suggest pulmonary embolus. Aortic atherosclerosis without dissection or acute aortic findings. Coronary artery calcifications. No pericardial effusion. Mediastinum/Nodes: No mediastinal or hilar adenopathy. No thyroid nodule. Decompressed esophagus. Lungs/Pleura: Incidental azygos fissure. No acute or focal airspace disease. No pleural fluid. No pulmonary edema. No pulmonary nodule or mass. Trachea and central bronchi are patent. Upper Abdomen: No acute or unexpected findings. Musculoskeletal: Thoracic spondylosis. There are no acute or suspicious osseous abnormalities. Mild degenerative change of the shoulders. Review of the MIP  images confirms the above findings. IMPRESSION: 1. No pulmonary embolus or acute intrathoracic abnormality. 2. Coronary artery calcifications.  Aortic atherosclerosis. Aortic Atherosclerosis (ICD10-I70.0). Electronically Signed   By: Keith Rake M.D.   On: 07/04/2020 17:58   ECHOCARDIOGRAM COMPLETE  Result Date: 07/04/2020    ECHOCARDIOGRAM REPORT   Patient Name:   EFE FAZZINO Date of Exam: 07/04/2020 Medical Rec #:  [id number]       Height:       66.0 in Accession #:    [id number]      Weight:       197.0 lb Date of Birth:  1933/05/16        BSA:          1.987 m Patient Age:    87 years        BP:           151/62 mmHg Patient Gender: M               HR:           69 bpm. Exam Location:  Inpatient Procedure: 2D Echo, Color Doppler and Cardiac Doppler Indications:    R07.9* Chest pain, unspecified  History:        Patient has prior history of Echocardiogram examinations, most                 recent 05/06/2014.  Sonographer:    Merrie Roof RDCS Referring Phys: [id number] [place]  1. Left ventricular ejection fraction, by estimation, is 55 to 60%. The left ventricle has normal function. The left ventricle has no regional wall motion abnormalities. There is mild left ventricular hypertrophy. Left ventricular diastolic parameters are consistent with Grade I diastolic dysfunction (impaired relaxation).  2. Right ventricular systolic function is normal. The right ventricular size is normal.  3. The mitral valve is normal in structure. No evidence of mitral valve regurgitation. No evidence of mitral stenosis.  4. The aortic valve is tricuspid. Aortic valve regurgitation is not visualized. No aortic stenosis is present. FINDINGS  Left Ventricle: Left ventricular ejection fraction, by estimation, is 55 to 60%. The left ventricle has normal function. The left ventricle has no regional wall motion abnormalities. The left ventricular internal cavity size was normal in size. There is  mild left  ventricular hypertrophy. Left ventricular diastolic parameters are consistent with Grade I diastolic dysfunction (impaired relaxation). Right Ventricle: The right ventricular size is normal. Right ventricular systolic function is normal. Left Atrium: Left atrial size was normal in size. Right Atrium: Right atrial size was normal in size. Pericardium: There is no evidence of pericardial effusion. Mitral Valve: The mitral valve is normal in structure. No evidence of mitral valve regurgitation. No evidence of mitral valve stenosis. Tricuspid Valve: The tricuspid valve is normal in structure. Tricuspid valve regurgitation is trivial. No evidence of tricuspid stenosis. Aortic Valve:  The aortic valve is tricuspid. Aortic valve regurgitation is not visualized. No aortic stenosis is present. Aortic valve mean gradient measures 7.0 mmHg. Aortic valve peak gradient measures 10.8 mmHg. Aortic valve area, by VTI measures 1.95  cm. Pulmonic Valve: The pulmonic valve was normal in structure. Pulmonic valve regurgitation is not visualized. No evidence of pulmonic stenosis. Aorta: The aortic root is normal in size and structure. Venous: The inferior vena cava was not well visualized. IAS/Shunts: The interatrial septum was not well visualized.  LEFT VENTRICLE PLAX 2D LVIDd:         4.10 cm  Diastology LVIDs:         2.80 cm  LV e' medial:    7.07 cm/s LV PW:         1.20 cm  LV E/e' medial:  9.5 LV IVS:        1.10 cm  LV e' lateral:   6.64 cm/s LVOT diam:     2.00 cm  LV E/e' lateral: 10.2 LV SV:         73 LV SV Index:   37 LVOT Area:     3.14 cm  RIGHT VENTRICLE RV Basal diam:  4.20 cm RV Mid diam:    3.80 cm LEFT ATRIUM             Index       RIGHT ATRIUM           Index LA diam:        4.30 cm 2.16 cm/m  RA Area:     19.00 cm LA Vol (A2C):   61.7 ml 31.05 ml/m RA Volume:   45.50 ml  22.90 ml/m LA Vol (A4C):   51.2 ml 25.77 ml/m LA Biplane Vol: 55.7 ml 28.03 ml/m  AORTIC VALVE AV Area (Vmax):    1.91 cm AV Area  (Vmean):   1.81 cm AV Area (VTI):     1.95 cm AV Vmax:           164.00 cm/s AV Vmean:          125.000 cm/s AV VTI:            0.374 m AV Peak Grad:      10.8 mmHg AV Mean Grad:      7.0 mmHg LVOT Vmax:         99.80 cm/s LVOT Vmean:        72.100 cm/s LVOT VTI:          0.232 m LVOT/AV VTI ratio: 0.62  AORTA Ao Root diam: 3.50 cm Ao Asc diam:  3.00 cm MITRAL VALVE MV Area (PHT): 2.66 cm     SHUNTS MV Decel Time: 285 msec     Systemic VTI:  0.23 m MV E velocity: 67.40 cm/s   Systemic Diam: 2.00 cm MV A velocity: 123.00 cm/s MV E/A ratio:  0.55 Kirk Ruths MD Electronically signed by Kirk Ruths MD Signature Date/Time: 07/04/2020/2:58:23 PM    Final    VAS [place] LOWER EXTREMITY VENOUS (DVT)  Result Date: 07/05/2020  Lower Venous DVT Study Patient Name:  KENTON FORTIN  Date of Exam:   07/05/2020 Medical Rec #: [id number]        Accession #:    [id number] Date of Birth: 1933-10-07         Patient Gender: M Patient Age:   54Y Exam Location:  [facility] Procedure:      VAS [place] LOWER EXTREMITY VENOUS (DVT) Referring  Phys: [id number] RONDELL A SMITH --------------------------------------------------------------------------------  Indications: Chest pain/dizziness & elevated D-dimer.  Comparison Study: No previous exams Performing Technologist: Jody Hill RVT, RDMS  Examination Guidelines: A complete evaluation includes B-mode imaging, spectral Doppler, color Doppler, and power Doppler as needed of all accessible portions of each vessel. Bilateral testing is considered an integral part of a complete examination. Limited examinations for reoccurring indications may be performed as noted. The reflux portion of the exam is performed with the patient in reverse Trendelenburg.  +---------+---------------+---------+-----------+----------+--------------+ RIGHT    CompressibilityPhasicitySpontaneityPropertiesThrombus Aging +---------+---------------+---------+-----------+----------+--------------+ CFV       Full           Yes      Yes                                 +---------+---------------+---------+-----------+----------+--------------+ SFJ      Full                                                        +---------+---------------+---------+-----------+----------+--------------+ FV Prox  Full           Yes      Yes                                 +---------+---------------+---------+-----------+----------+--------------+ FV Mid   Full           Yes      Yes                                 +---------+---------------+---------+-----------+----------+--------------+ FV DistalFull           Yes      Yes                                 +---------+---------------+---------+-----------+----------+--------------+ PFV      Full                                                        +---------+---------------+---------+-----------+----------+--------------+ POP      Full           Yes      Yes                                 +---------+---------------+---------+-----------+----------+--------------+ PTV      Full                                                        +---------+---------------+---------+-----------+----------+--------------+ PERO     Full                                                        +---------+---------------+---------+-----------+----------+--------------+   +---------+---------------+---------+-----------+----------+--------------+  LEFT     CompressibilityPhasicitySpontaneityPropertiesThrombus Aging +---------+---------------+---------+-----------+----------+--------------+ CFV      Full           Yes      Yes                                 +---------+---------------+---------+-----------+----------+--------------+ SFJ      Full                                                        +---------+---------------+---------+-----------+----------+--------------+ FV Prox  Full           Yes      Yes                                  +---------+---------------+---------+-----------+----------+--------------+ FV Mid   Full           Yes      Yes                                 +---------+---------------+---------+-----------+----------+--------------+ FV DistalFull           Yes      Yes                                 +---------+---------------+---------+-----------+----------+--------------+ PFV      Full                                                        +---------+---------------+---------+-----------+----------+--------------+ POP      Full           Yes      Yes                                 +---------+---------------+---------+-----------+----------+--------------+ PTV      Full                                                        +---------+---------------+---------+-----------+----------+--------------+ PERO     Full                                                        +---------+---------------+---------+-----------+----------+--------------+    Summary: BILATERAL: - No evidence of deep vein thrombosis seen in the lower extremities, bilaterally. - No evidence of superficial venous thrombosis in the lower extremities, bilaterally. -No evidence of popliteal cyst, bilaterally.   *See table(s) above for measurements and observations.    Preliminary      Subjective: Patient is feeling better, no nausea or vomiting, no chest  pain or dyspnea. Improved disbalance.   Discharge Exam: Vitals:   07/06/20 0212 07/06/20 0522  BP: (!) 127/53 (!) (P) 139/46  Pulse:  (!) (P) 57  Resp:  (P) 18  Temp:  (P) 98.2 F (36.8 C)  SpO2:  (P) 97%   Vitals:   07/05/20 1718 07/06/20 0021 07/06/20 0212 07/06/20 0522  BP: 105/63  (!) 127/53 (!) (P) 139/46  Pulse: (!) 53 63  (!) (P) 57  Resp: 18 18  (P) 18  Temp: 98.9 F (37.2 C) 99 F (37.2 C)  (P) 98.2 F (36.8 C)  TempSrc: Oral Oral  (P) Oral  SpO2: 98% 97%  (P) 97%  Weight:      Height:        General: Not in pain  or dyspnea  Neurology: Awake and alert, non focal  E ENT: no pallor, no icterus, oral mucosa moist Cardiovascular: No JVD. S1-S2 present, rhythmic, no gallops, rubs, or murmurs. No lower extremity edema. Pulmonary: positive breath sounds bilaterally, adequate air movement, no wheezing, rhonchi or rales. Gastrointestinal. Abdomen soft and non tender Skin. No rashes Musculoskeletal: no joint deformities   The results of significant diagnostics from this hospitalization (including imaging, microbiology, ancillary and laboratory) are listed below for reference.     Microbiology: Recent Results (from the past 240 hour(s))  Resp Panel by RT-PCR (Flu A&B, Covid) Nasopharyngeal Swab     Status: None   Collection Time: 07/04/20  7:04 AM   Specimen: Nasopharyngeal Swab; Nasopharyngeal(NP) swabs in vial transport medium  Result Value Ref Range Status   SARS Coronavirus 2 by RT PCR NEGATIVE NEGATIVE Final    Comment: (NOTE) SARS-CoV-2 target nucleic acids are NOT DETECTED.  The SARS-CoV-2 RNA is generally detectable in upper respiratory specimens during the acute phase of infection. The lowest concentration of SARS-CoV-2 viral copies this assay can detect is 138 copies/mL. A negative result does not preclude SARS-Cov-2 infection and should not be used as the sole basis for treatment or other patient management decisions. A negative result may occur with  improper specimen collection/handling, submission of specimen other than nasopharyngeal swab, presence of viral mutation(s) within the areas targeted by this assay, and inadequate number of viral copies(<138 copies/mL). A negative result must be combined with clinical observations, patient history, and epidemiological information. The expected result is Negative.  Fact Sheet for Patients:  EntrepreneurPulse.com.au  Fact Sheet for Healthcare Providers:  IncredibleEmployment.be  This test is no t yet  approved or cleared by the [place] FDA and  has been authorized for detection and/or diagnosis of SARS-CoV-2 by FDA under an Emergency Use Authorization (EUA). This EUA will remain  in effect (meaning this test can be used) for the duration of the COVID-19 declaration under Section 564(b)(1) of the Act, 21 U.S.C.section 360bbb-3(b)(1), unless the authorization is terminated  or revoked sooner.       Influenza A by PCR NEGATIVE NEGATIVE Final   Influenza B by PCR NEGATIVE NEGATIVE Final    Comment: (NOTE) The Xpert Xpress SARS-CoV-2/FLU/RSV plus assay is intended as an aid in the diagnosis of influenza from Nasopharyngeal swab specimens and should not be used as a sole basis for treatment. Nasal washings and aspirates are unacceptable for Xpert Xpress SARS-CoV-2/FLU/RSV testing.  Fact Sheet for Patients: EntrepreneurPulse.com.au  Fact Sheet for Healthcare Providers: IncredibleEmployment.be  This test is not yet approved or cleared by the [place] FDA and has been authorized for detection and/or diagnosis of SARS-CoV-2 by FDA under an Emergency  Use Authorization (EUA). This EUA will remain in effect (meaning this test can be used) for the duration of the COVID-19 declaration under Section 564(b)(1) of the Act, 21 U.S.C. section 360bbb-3(b)(1), unless the authorization is terminated or revoked.  Performed at [facility], [place] [address] [place], [place] [address]      Labs: BNP (last 3 results) Recent Labs    07/03/20 2026  BNP 540.0*   Basic Metabolic Panel: Recent Labs  Lab 07/03/20 2026 07/04/20 1107 07/05/20 0316 07/06/20 0248  NA 134* 133* 131* 132*  K 4.8 4.4 4.8 4.2  CL 102 103 97* 100  CO2 25 23 26 25   GLUCOSE 163* 109* 119* 143*  BUN 24* 24* 21 24*  CREATININE 1.28* 1.14 1.29* 1.28*  CALCIUM 8.6* 8.4* 8.7* 8.3*   Liver Function Tests: Recent Labs  Lab 07/03/20 2026 07/05/20 1509  AST 36 46*   ALT 30 42  ALKPHOS 53 87  BILITOT 0.5 0.9  PROT 5.9* 5.1*  ALBUMIN 3.4* 2.9*   Recent Labs  Lab 07/03/20 2026  LIPASE 36   No results for input(s): AMMONIA in the last 168 hours. CBC: Recent Labs  Lab 07/03/20 2026 07/04/20 1107 07/05/20 0316 07/06/20 0248  WBC 2.8* 2.4* 2.9* 3.0*  NEUTROABS 2.4  --  2.6 1.9  HGB 12.2* 11.3* 11.7* 10.3*  HCT 37.6* 33.1* 34.8* 30.8*  MCV 94.9 92.5 92.1 90.9  PLT 104* 82* 79* 80*   Cardiac Enzymes: Recent Labs  Lab 07/03/20 2026  CKTOTAL 79   BNP: Invalid input(s): POCBNP CBG: No results for input(s): GLUCAP in the last 168 hours. D-Dimer Recent Labs    07/04/20 1107  DDIMER 3.37*   Hgb A1c Recent Labs    07/04/20 1107  HGBA1C 5.9*   Lipid Profile Recent Labs    07/04/20 1107  CHOL 122  HDL 52  LDLCALC 51  TRIG 97  CHOLHDL 2.3   Thyroid function studies Recent Labs    07/05/20 1509  TSH 3.203   Anemia work up Recent Labs    07/05/20 1509  VITAMINB12 260  FERRITIN 280   Urinalysis    Component Value Date/Time   COLORURINE YELLOW 07/03/2020 1953   APPEARANCEUR HAZY (A) 07/03/2020 1953   LABSPEC 1.028 07/03/2020 1953   PHURINE 5.0 07/03/2020 1953   GLUCOSEU NEGATIVE 07/03/2020 1953   HGBUR NEGATIVE 07/03/2020 1953   BILIRUBINUR NEGATIVE 07/03/2020 1953   KETONESUR NEGATIVE 07/03/2020 1953   PROTEINUR 30 (A) 07/03/2020 1953   UROBILINOGEN 0.2 02/02/2014 1109   NITRITE NEGATIVE 07/03/2020 1953   LEUKOCYTESUR NEGATIVE 07/03/2020 1953   Sepsis Labs Invalid input(s): PROCALCITONIN,  WBC,  LACTICIDVEN Microbiology Recent Results (from the past 240 hour(s))  Resp Panel by RT-PCR (Flu A&B, Covid) Nasopharyngeal Swab     Status: None   Collection Time: 07/04/20  7:04 AM   Specimen: Nasopharyngeal Swab; Nasopharyngeal(NP) swabs in vial transport medium  Result Value Ref Range Status   SARS Coronavirus 2 by RT PCR NEGATIVE NEGATIVE Final    Comment: (NOTE) SARS-CoV-2 target nucleic acids are NOT  DETECTED.  The SARS-CoV-2 RNA is generally detectable in upper respiratory specimens during the acute phase of infection. The lowest concentration of SARS-CoV-2 viral copies this assay can detect is 138 copies/mL. A negative result does not preclude SARS-Cov-2 infection and should not be used as the sole basis for treatment or other patient management decisions. A negative result may occur with  improper specimen collection/handling, submission of specimen other  than nasopharyngeal swab, presence of viral mutation(s) within the areas targeted by this assay, and inadequate number of viral copies(<138 copies/mL). A negative result must be combined with clinical observations, patient history, and epidemiological information. The expected result is Negative.  Fact Sheet for Patients:  EntrepreneurPulse.com.au  Fact Sheet for Healthcare Providers:  IncredibleEmployment.be  This test is no t yet approved or cleared by the [place] FDA and  has been authorized for detection and/or diagnosis of SARS-CoV-2 by FDA under an Emergency Use Authorization (EUA). This EUA will remain  in effect (meaning this test can be used) for the duration of the COVID-19 declaration under Section 564(b)(1) of the Act, 21 U.S.C.section 360bbb-3(b)(1), unless the authorization is terminated  or revoked sooner.       Influenza A by PCR NEGATIVE NEGATIVE Final   Influenza B by PCR NEGATIVE NEGATIVE Final    Comment: (NOTE) The Xpert Xpress SARS-CoV-2/FLU/RSV plus assay is intended as an aid in the diagnosis of influenza from Nasopharyngeal swab specimens and should not be used as a sole basis for treatment. Nasal washings and aspirates are unacceptable for Xpert Xpress SARS-CoV-2/FLU/RSV testing.  Fact Sheet for Patients: EntrepreneurPulse.com.au  Fact Sheet for Healthcare Providers: IncredibleEmployment.be  This test is not yet  approved or cleared by the [place] FDA and has been authorized for detection and/or diagnosis of SARS-CoV-2 by FDA under an Emergency Use Authorization (EUA). This EUA will remain in effect (meaning this test can be used) for the duration of the COVID-19 declaration under Section 564(b)(1) of the Act, 21 U.S.C. section 360bbb-3(b)(1), unless the authorization is terminated or revoked.  Performed at [facility], [place] [address], [place], [place] [address]      Time coordinating discharge: 45 minutes  SIGNED:   Tawni Millers, MD  [facility] 07/06/2020, 9:04 AM

## 2020-07-07 LAB — PATHOLOGIST SMEAR REVIEW

## 2020-07-07 NOTE — Care Management (Signed)
Prescription for OP PT faxed to [facility] .

## 2020-07-10 LAB — CULTURE, BLOOD (ROUTINE X 2)
Culture: NO GROWTH
Culture: NO GROWTH
Special Requests: ADEQUATE

## 2020-07-10 LAB — LYME DISEASE DNA BY PCR(BORRELIA BURG): Lyme Disease(B.burgdorferi)PCR: NEGATIVE

## 2020-07-14 DIAGNOSIS — S2096XS Insect bite (nonvenomous) of unspecified parts of thorax, sequela: Secondary | ICD-10-CM | POA: Diagnosis not present

## 2020-07-14 DIAGNOSIS — Z7689 Persons encountering health services in other specified circumstances: Secondary | ICD-10-CM | POA: Diagnosis not present

## 2020-07-14 DIAGNOSIS — W57XXXS Bitten or stung by nonvenomous insect and other nonvenomous arthropods, sequela: Secondary | ICD-10-CM | POA: Diagnosis not present

## 2020-07-14 DIAGNOSIS — R42 Dizziness and giddiness: Secondary | ICD-10-CM | POA: Diagnosis not present

## 2020-07-14 DIAGNOSIS — R7989 Other specified abnormal findings of blood chemistry: Secondary | ICD-10-CM | POA: Diagnosis not present

## 2020-07-14 DIAGNOSIS — R2689 Other abnormalities of gait and mobility: Secondary | ICD-10-CM | POA: Diagnosis not present

## 2020-07-14 LAB — EHRLICHIA ANTIBODY PANEL
E chaffeensis (HGE) Ab, IgG: NEGATIVE
E chaffeensis (HGE) Ab, IgM: NEGATIVE
E. Chaffeensis (HME) IgM Titer: NEGATIVE
E.Chaffeensis (HME) IgG: NEGATIVE

## 2020-07-14 LAB — ROCKY MTN SPOTTED FVR ABS PNL(IGG+IGM)
RMSF IgG: NEGATIVE
RMSF IgM: 0.25 index (ref 0.00–0.89)

## 2020-07-20 DIAGNOSIS — Z4789 Encounter for other orthopedic aftercare: Secondary | ICD-10-CM | POA: Diagnosis not present

## 2020-07-20 DIAGNOSIS — G5603 Carpal tunnel syndrome, bilateral upper limbs: Secondary | ICD-10-CM | POA: Diagnosis not present

## 2020-07-20 DIAGNOSIS — G5621 Lesion of ulnar nerve, right upper limb: Secondary | ICD-10-CM | POA: Diagnosis not present

## 2020-07-20 DIAGNOSIS — M1812 Unilateral primary osteoarthritis of first carpometacarpal joint, left hand: Secondary | ICD-10-CM | POA: Diagnosis not present

## 2020-07-21 ENCOUNTER — Other Ambulatory Visit: Payer: Self-pay

## 2020-07-21 ENCOUNTER — Ambulatory Visit
Admission: EM | Admit: 2020-07-21 | Discharge: 2020-07-21 | Disposition: A | Discharge status: Home / Self Care | Payer: Medicare PPO | Attending: Emergency Medicine | Admitting: Emergency Medicine

## 2020-07-21 ENCOUNTER — Encounter: Payer: Self-pay | Admitting: Emergency Medicine

## 2020-07-21 DIAGNOSIS — R310 Gross hematuria: Secondary | ICD-10-CM | POA: Diagnosis not present

## 2020-07-21 LAB — POCT URINALYSIS DIP (MANUAL ENTRY)
Bilirubin, UA: NEGATIVE
Glucose, UA: NEGATIVE mg/dL
Ketones, POC UA: NEGATIVE mg/dL
Nitrite, UA: NEGATIVE
Protein Ur, POC: 30 mg/dL — AB
Spec Grav, UA: 1.02 (ref 1.010–1.025)
Urobilinogen, UA: 0.2 E.U./dL
pH, UA: 5 (ref 5.0–8.0)

## 2020-07-21 MED ORDER — SULFAMETHOXAZOLE-TRIMETHOPRIM 800-160 MG PO TABS: 1.0000 | ORAL_TABLET | Freq: Two times a day (BID) | ORAL | 0 refills | Status: AC

## 2020-07-21 NOTE — ED Provider Notes (Signed)
[facility]    CSN: [id number] Arrival date & time: 07/21/20  1532      History   Chief Complaint Chief Complaint  Patient presents with   Hematuria    HPI [name] Ray is a 87 y.o. male history of BPH, CAD, GERD, prostate cancer, hypertension, on Plavix presenting today for evaluation of hematuria.  Reports gross hematuria beginning this morning.  Denies associated dysuria, urinary frequency.  Reports continued normal urinary hesitancy in his stream.  Reports some slight left-sided discomfort.  Reports continued ongoing dizziness which has been normal for him over many weeks, previously worked up in emergency room for possible tickborne illness and took 10-day course of doxycycline.  Denies any recent dose changes in his Plavix.  Prostate cancer treated with radiation/seeds.  Reports that he follows up annually with urologist.  Denies any recent fevers, but was having fevers a few weeks ago when he was evaluated in the emergency room.  Denies history of stones.  HPI  Past Medical History:  Diagnosis Date   Adenomatous polyp    history   Arthritis    knees , shoulders, hands    Atrial bigeminy    in the past   BPH (benign prostatic hyperplasia)    Carotid artery disease ([place])    a. Doppler, January, 2012, 40-59% bilateral stenoses;  b.  Carotids 4/14:  40-59% bilat ICA, occluded R vertebral artery; f/u 1 year // Carotid US 07/2018:  Bilat ICA 1-39; R subclavian stenosis >> rpt in 1 year  // Carotid US 07/2019: Bilateral ICA 40-59; right subclavian stenosis; repeat 1 year    Colon polyps    Coronary artery disease    acute inferior MI Jan 2002 with stent / Taxus stent LAD 2004 / Cypher stent prox LAD and cypher stent mid circ May 2006 / nuclear stress Dec 2009 normal EF 63% / nuclear August 2011 EF 62% / inferior thining and very mild ischemia inferior and apex / catherization Sept 2011 EF 60%  mild to moderate diffuse RCA disease, moderate discrete LAD stenosis before  the stent in the proximal LAD med tx rec   Diverticulosis    Dyslipidemia    Ejection fraction    Fatty liver    history   GERD (gastroesophageal reflux disease)    Hypertension    [facility] - 5 stents total , last cardiac cath. 2011.  Cleared for  surgery by Dr. [name]- 04/23/2013   IBS (irritable bowel syndrome)    Myocardial infarction ([place]) 2002   Myoview    Myoview 07/2018: EF 63, inferior artifact, no ischemia or scar; Low Risk   Numbness and tingling in hands    resolved   Overweight(278.02)    Prostate cancer ([place]) approx 2016   treated /w  Lupron injection x1, then radium   Shortness of breath dyspnea    Type II diabetes mellitus ([place])     Patient Active Problem List   Diagnosis Date Noted   Pancytopenia ([place])    Diarrhea    Tick bite    Chest pain 07/04/2020   SIRS (systemic inflammatory response syndrome) ([place]) 07/04/2020   Renal insufficiency 07/04/2020   BPH (benign prostatic hyperplasia) 07/04/2020   OA (osteoarthritis) of hip 01/26/2018   Spinal stenosis of lumbar region with neurogenic claudication 09/11/2017   Dizziness 05/04/2014   Ejection fraction    S/P total knee arthroplasty 05/24/2013   Carotid artery disease (HCC)    Coronary artery disease  Hyperlipidemia    GERD (gastroesophageal reflux disease)    Hypertension    Numbness and tingling in hands    FECAL OCCULT BLOOD 10/19/2009   COLONIC POLYPS, ADENOMATOUS, HX OF 10/19/2009   OVERWEIGHT 11/08/2008   Type 2 diabetes mellitus with hyperlipidemia ([place]) 10/25/2008    Past Surgical History:  Procedure Laterality Date   CARDIAC CATHETERIZATION     last cath- 2006   COLONOSCOPY     CORONARY ANGIOPLASTY WITH STENT PLACEMENT     multiple stents in LAD:  "I've got 5 stents" (05/24/2013)   EYE SURGERY Bilateral    cataract surgery with lens implants   LUMBAR LAMINECTOMY/DECOMPRESSION MICRODISCECTOMY N/A 09/11/2017   Procedure: LAMINECTOMY AND FORAMINOTOMY LUMBAR One-Two, Two-Three,  Three-Four, Four-Five, Five-Sacral One;  Surgeon: Newman Pies, MD;  Location: [place];  Service: Neurosurgery;  Laterality: N/A;  LAMINECTOMY AND FORAMINOTOMY LUMBAR One-Two,Two-Three, Three-Four, Four-Five, Five-Sacral One   NEUROPLASTY / TRANSPOSITION MEDIAN NERVE AT CARPAL TUNNEL Left    also surgery on thumb, elbow- nerve release    PROSTATE BIOPSY     followed later by HDR   TOTAL HIP ARTHROPLASTY Left 01/26/2018   Procedure: TOTAL HIP ARTHROPLASTY ANTERIOR APPROACH;  Surgeon: [name], MD;  Location: [facility];  Service: Orthopedics;  Laterality: Left;  165min   TOTAL KNEE ARTHROPLASTY Right 05/24/2013   TOTAL KNEE ARTHROPLASTY Right 05/24/2013   Procedure: RIGHT TOTAL KNEE ARTHROPLASTY;  Surgeon: [name], MD;  Location: [place];  Service: Orthopedics;  Laterality: Right;   TOTAL KNEE ARTHROPLASTY Left 02/14/2014   Procedure: LEFT TOTAL KNEE ARTHROPLASTY;  Surgeon: [name], MD;  Location: [place];  Service: Orthopedics;  Laterality: Left;       Home Medications    Prior to Admission medications   Medication Sig Start Date End Date Taking? Authorizing Provider  sulfamethoxazole-trimethoprim (BACTRIM DS) 800-160 MG tablet Take 1 tablet by mouth 2 (two) times daily for 10 days. 07/21/20 07/31/20 Yes Kaylanni Ezelle C, PA-C  acetaminophen (TYLENOL) 500 MG tablet Take 1 tablet (500 mg total) by mouth every 6 (six) hours as needed for mild pain. 07/23/18   Richardson Dopp T, PA-C  amLODipine (NORVASC) 5 MG tablet Take 1 tablet (5 mg total) by mouth daily. 01/22/19   Richardson Dopp T, PA-C  clopidogrel (PLAVIX) 75 MG tablet Take 75 mg by mouth daily.    [provider]  Guaifenesin 1200 MG TB12 Take 1,200 mg by mouth daily as needed (cough).    [provider]  JENTADUETO XR 2.05-998 MG TB24 Take 1 tablet by mouth daily. 02/12/18   [provider]  loperamide (IMODIUM A-D) 2 MG tablet Take 2 mg by mouth 4 (four) times daily as needed for diarrhea or loose stools.     [provider]  loratadine (CLARITIN) 10 MG tablet Take 10 mg by mouth daily as needed for allergies.    [provider]  Multiple Vitamin (MULTI-VITAMIN) tablet Take 1 tablet by mouth daily.    [provider]  nitroGLYCERIN (NITROSTAT) 0.4 MG SL tablet Place 1 tablet (0.4 mg total) under the tongue every 5 (five) minutes as needed for chest pain. Please keep upcoming appt. Thank you 04/23/17   Sherren Mocha, MD  omeprazole (PRILOSEC) 20 MG capsule Take 20 mg by mouth daily.    [provider]  ramipril (ALTACE) 10 MG capsule Take 10 mg by mouth daily after breakfast.    [provider]  simvastatin (ZOCOR) 20 MG tablet Take 20 mg by  mouth at bedtime.    [provider]  tamsulosin (FLOMAX) 0.4 MG CAPS capsule Take 0.4 mg by mouth at bedtime. 04/01/13   [provider]    Family History Family History  Problem Relation Age of Onset   Hypertension Father    Heart disease Father    Heart disease Brother    Heart attack Paternal Grandfather     Social History Social History   Tobacco Use   Smoking status: Never   Smokeless tobacco: Never  Vaping Use   Vaping Use: Never used  Substance Use Topics   Alcohol use: No   Drug use: No     Allergies   Atorvastatin, Rosuvastatin, and Clindamycin   Review of Systems Review of Systems  Constitutional:  Negative for fever.  HENT:  Negative for sore throat.   Respiratory:  Negative for shortness of breath.   Cardiovascular:  Negative for chest pain.  Gastrointestinal:  Negative for abdominal pain, nausea and vomiting.  Genitourinary:  Positive for hematuria. Negative for difficulty urinating, dysuria, frequency, penile discharge, penile pain, penile swelling, scrotal swelling and testicular pain.  Skin:  Negative for rash.  Neurological:  Negative for dizziness, light-headedness and headaches.    Physical Exam Triage Vital Signs ED Triage Vitals  Enc Vitals Group      BP      Pulse      Resp      Temp      Temp src      SpO2      Weight      Height      Head Circumference      Peak Flow      Pain Score      Pain Loc      Pain Edu?      Excl. in [place]?    No data found.  Updated Vital Signs BP (!) 142/58 (BP Location: Left Arm)   Pulse 60   Temp 98.1 F (36.7 C) (Oral)   Resp 18   SpO2 97%   Visual Acuity Right Eye Distance:   Left Eye Distance:   Bilateral Distance:    Right Eye Near:   Left Eye Near:    Bilateral Near:     Physical Exam Vitals and nursing note reviewed.  Constitutional:      Appearance: He is well-developed.     Comments: No acute distress  HENT:     Head: Normocephalic and atraumatic.     Nose: Nose normal.  Eyes:     Conjunctiva/sclera: Conjunctivae normal.  Cardiovascular:     Rate and Rhythm: Normal rate.  Pulmonary:     Effort: Pulmonary effort is normal. No respiratory distress.  Abdominal:     General: There is no distension.  Musculoskeletal:        General: Normal range of motion.     Cervical back: Neck supple.  Skin:    General: Skin is warm and dry.  Neurological:     Mental Status: He is alert and oriented to person, place, and time.     UC Treatments / Results  Labs (all labs ordered are listed, but only abnormal results are displayed) Labs Reviewed  POCT URINALYSIS DIP (MANUAL ENTRY) - Abnormal; Notable for the following components:      Result Value   Color, UA red (*)    Clarity, UA turbid (*)    Blood, UA large (*)    Protein Ur, POC =30 (*)  Leukocytes, UA Trace (*)    All other components within normal limits  URINE CULTURE    EKG   Radiology No results found.  Procedures Procedures (including critical care time)  Medications Ordered in UC Medications - No data to display  Initial Impression / Assessment and Plan / UC Course  I have reviewed the triage vital signs and the nursing notes.  Pertinent labs & imaging results that were available during my care  of the patient were reviewed by me and considered in my medical decision making (see chart for details).     UA with trace leuks, large hemoglobin, 30 protein, opting to go ahead and treat for UTI with Bactrim and will send for urine culture to further evaluate for UTI.  Cannot rule out underlying stone given associated intermittent left-sided discomfort, provided strainer, will defer any NSAIDs due to use of Plavix, already on Flomax, monitor for resolution of hematuria, encouraged follow-up with urology.  Follow-up here emergency room if symptoms progressing or worsening.  Discussed strict return precautions. Patient verbalized understanding and is agreeable with plan.  Final Clinical Impressions(s) / UC Diagnoses   Final diagnoses:  Gross hematuria     Discharge Instructions      Small amount of bacteria in your urine, urine culture pending for confirmation of UTI Begin Bactrim twice daily x10 days Please monitor for resolution of blood in urine Drink plenty of water Follow-up with urology     ED Prescriptions     Medication Sig Dispense Auth. Provider   sulfamethoxazole-trimethoprim (BACTRIM DS) 800-160 MG tablet Take 1 tablet by mouth 2 (two) times daily for 10 days. 20 tablet Ayaat Jansma, [place] C, PA-C      PDMP not reviewed this encounter.   Joneen Caraway [place] C, PA-C 07/21/20 1723

## 2020-07-21 NOTE — Discharge Instructions (Addendum)
Small amount of bacteria in your urine, urine culture pending for confirmation of UTI Begin Bactrim twice daily x10 days Please monitor for resolution of blood in urine Drink plenty of water Follow-up with urology

## 2020-07-21 NOTE — ED Triage Notes (Signed)
Pt presents today with c/o of blood described as "clumps" in urine this am. Denies dysuria.

## 2020-07-24 LAB — URINE CULTURE: Culture: 50000 — AB

## 2020-07-26 ENCOUNTER — Other Ambulatory Visit (HOSPITAL_COMMUNITY): Payer: Self-pay | Admitting: Physician Assistant

## 2020-07-26 ENCOUNTER — Ambulatory Visit (HOSPITAL_COMMUNITY)
Admission: RE | Admit: 2020-07-26 | Discharge: 2020-07-26 | Disposition: A | Discharge status: Home / Self Care | Payer: Medicare PPO | Source: Ambulatory Visit | Attending: Cardiovascular Disease | Admitting: Cardiovascular Disease

## 2020-07-26 ENCOUNTER — Other Ambulatory Visit: Payer: Self-pay

## 2020-07-26 DIAGNOSIS — I6523 Occlusion and stenosis of bilateral carotid arteries: Secondary | ICD-10-CM | POA: Insufficient documentation

## 2020-07-26 DIAGNOSIS — I779 Disorder of arteries and arterioles, unspecified: Secondary | ICD-10-CM

## 2020-07-28 ENCOUNTER — Encounter: Payer: Self-pay | Admitting: Physician Assistant

## 2020-07-31 ENCOUNTER — Other Ambulatory Visit: Payer: Self-pay | Admitting: *Deleted

## 2020-07-31 DIAGNOSIS — I6523 Occlusion and stenosis of bilateral carotid arteries: Secondary | ICD-10-CM

## 2020-08-15 ENCOUNTER — Ambulatory Visit: Payer: Medicare PPO | Admitting: Physician Assistant

## 2020-08-15 NOTE — Progress Notes (Addendum)
Cardiology Office Note:    Date:  08/16/2020   ID:  [name], DOB Sep 08, 1933, MRN [id number]  PCP:  [name] Pao, MD   [facility] Providers Cardiologist:  Sherren Mocha, MD Cardiology APP:  [facility]      Referring MD: [name] Pao, MD   Chief Complaint:  Hospitalization Follow-up (Chest pain )    Patient Profile:    [name] Ray is a 87 y.o. male with:  Coronary artery disease S/p Inf MI in 2002 tx with PCI S/p Taxus DES to LAD in 2004 S/p Cypher DES to pLAD and Cypher DES to [facility] in 05/2004 LHC in 2011 - stents patent Myoview 07/2018: low risk  Diabetes Mellitus Hypertension Hyperlipidemia Chronic kidney disease II-III Carotid artery disease [place] 07/2018: bilat ICA 1-39; R subcl stenosis  BPH Prostate CA s/p radiation Aortic atherosclerosis   Prior CV studies: VAS US CAROTID DUPLEX BILATERAL [id number] LICA are consistent with a 40-59% stenosis. Vertebrals:  Both vertebral arteries have delayed acceleration times R > L, which could indicate proximal stenosis. Subclavians: Right subclavian artery flow was disturbed.   Echocardiogram 07/04/20 EF 55-60, GR 1 DD, no RWMA, mild LVH, normal RVSF  Event monitor 07/2018 The basic rhythm is normal sinus with an average heart rate of 63 bpm Thre is no atrial fibrillation or flutter There are no pathologic pauses > 3 seconds There are rare PVC's and a 7 beat run of NSVT is present There are rare PAC's and short supraventricular runs of up to 10 beats No sustained arrhythmias  Myoview 07/22/2018 EF 63, no ischemia or infarction, low risk study   Echo 05/06/14 Mild conc LVH, EF 65-70, no RWMA, Gr 1 DD, trivial TR   Cardiac Catheterization 9/11:  EF 60% RCA proximal stent patent with 30-40% ISR, mid 30-40%, 40% prior to bifurcation into PLV and PDA RI ostial 30% LCx 30%, mid to distal stents patent with minimal ISR LAD proximal stent patent with mild ISR, proximal LAD before the stent  50-60% (not flow-limiting). Medical therapy was continued.   History of Present Illness: [name] was last seen in clinic by Coletta Memos, NP for surgical clearance.  He was admitted 6/20-6/23 with chest pain and dizziness.  Troponins were neg.  An echocardiogram demonstrated normal EF and no WMA.  His symptoms improved with PPI and no further CV testing was recommended.  His dizziness was felt to be likely from BPPV.  He also had fevers and there was concern for tick borne illness.  He was tx with doxycycline.  His ehrlichia and RMSF serologies were neg.  He returns for f/u.  He is here alone.  He had follow-up serologies with his primary care physician.  He notes that his RMSF came back positive.  He feels better since he finished the doxycycline.  He has not had any significant chest discomfort.  He has not had significant shortness of breath.  He has not had orthopnea, syncope.  He does have some dependent leg edema.  He continues to have issues with dizziness.  He is currently going to physical therapy/vestibular rehabilitation.    Past Medical History:  Diagnosis Date   Adenomatous polyp    history   Arthritis    knees , shoulders, hands    Atrial bigeminy    in the past   BPH (benign prostatic hyperplasia)    Carotid artery disease ([place])    a. Doppler, January, 2012, 40-59% bilateral stenoses;  b.  Carotids 4/14:  40-59% bilat ICA, occluded R vertebral artery; f/u 1 year // Carotid US 07/2018:  Bilat ICA 1-39; R subclavian stenosis >> rpt in 1 year  // Carotid US 07/2019: Bilateral ICA 40-59; right subclavian stenosis; repeat 1 year // Carotid US 99991111: LICA [id number], bilat VA with delayed acceleration times, R subclavian stenosis.   Colon polyps    Coronary artery disease    acute inferior MI Jan 2002 with stent / Taxus stent LAD 2004 / Cypher stent prox LAD and cypher stent mid circ May 2006 / nuclear stress Dec 2009 normal EF 63% / nuclear August 2011 EF 62% / inferior thining and very  mild ischemia inferior and apex / catherization Sept 2011 EF 60%  mild to moderate diffuse RCA disease, moderate discrete LAD stenosis before the stent in the proximal LAD med tx rec   Diverticulosis    Dyslipidemia    Ejection fraction    Fatty liver    history   GERD (gastroesophageal reflux disease)    Hypertension    [facility] - 5 stents total , last cardiac cath. 2011.  Cleared for  surgery by Dr. [name]- 04/23/2013   IBS (irritable bowel syndrome)    Myocardial infarction [facility]) 2002   Myoview    Myoview 07/2018: EF 63, inferior artifact, no ischemia or scar; Low Risk   Numbness and tingling in hands    resolved   Overweight(278.02)    Prostate cancer ([place]) approx 2016   treated /w  Lupron injection x1, then radium   Shortness of breath dyspnea    Type II diabetes mellitus (HCC)     Current Medications: Current Meds  Medication Sig   acetaminophen (TYLENOL) 500 MG tablet Take 1 tablet (500 mg total) by mouth every 6 (six) hours as needed for mild pain.   amLODipine (NORVASC) 5 MG tablet Take 1 tablet (5 mg total) by mouth daily.   clopidogrel (PLAVIX) 75 MG tablet Take 75 mg by mouth daily.   Guaifenesin 1200 MG TB12 Take 1,200 mg by mouth daily as needed (cough).   JENTADUETO XR 2.05-998 MG TB24 Take 1 tablet by mouth daily.   loperamide (IMODIUM A-D) 2 MG tablet Take 2 mg by mouth 4 (four) times daily as needed for diarrhea or loose stools.   loratadine (CLARITIN) 10 MG tablet Take 10 mg by mouth daily as needed for allergies.   Multiple Vitamin (MULTI-VITAMIN) tablet Take 1 tablet by mouth daily.   nitroGLYCERIN (NITROSTAT) 0.4 MG SL tablet Place 1 tablet (0.4 mg total) under the tongue every 5 (five) minutes as needed for chest pain. Please keep upcoming appt. Thank you   omeprazole (PRILOSEC) 20 MG capsule Take 20 mg by mouth daily.   ramipril (ALTACE) 10 MG capsule Take 10 mg by mouth daily after breakfast.   simvastatin (ZOCOR) 20 MG tablet Take 20 mg by mouth at  bedtime.   tamsulosin (FLOMAX) 0.4 MG CAPS capsule Take 0.4 mg by mouth at bedtime.     Allergies:   Atorvastatin, Rosuvastatin, and Clindamycin   Social History   Tobacco Use   Smoking status: Never   Smokeless tobacco: Never  Vaping Use   Vaping Use: Never used  Substance Use Topics   Alcohol use: No   Drug use: No     Family Hx: The patient's family history includes Heart attack in his paternal grandfather; Heart disease in his brother and father; Hypertension in his father.  Review of Systems  Genitourinary:  Positive for hematuria (Pt has f/u with PCP next week).    EKGs/Labs/Other Test Reviewed:    EKG:  EKG is not ordered today.  The ekg ordered today demonstrates n/a  Recent Labs: 07/03/2020: B Natriuretic Peptide 141.6 07/05/2020: ALT 42; TSH 3.203 07/06/2020: BUN 24; Creatinine, Ser 1.28; Hemoglobin 10.3; Platelets 80; Potassium 4.2; Sodium 132   Recent Lipid Panel Lab Results  Component Value Date/Time   CHOL 122 07/04/2020 11:07 AM   TRIG 97 07/04/2020 11:07 AM   HDL 52 07/04/2020 11:07 AM   LDLCALC 51 07/04/2020 11:07 AM      Risk Assessment/Calculations:      Physical Exam:    VS:  BP (!) 110/42   Pulse 60   Ht '5\' 6"'$  (1.676 m)   Wt 197 lb 3.2 oz (89.4 kg)   SpO2 97%   BMI 31.83 kg/m     Wt Readings from Last 3 Encounters:  08/16/20 197 lb 3.2 oz (89.4 kg)  07/03/20 197 lb (89.4 kg)  03/21/20 204 lb (92.5 kg)     Constitutional:      Appearance: Healthy appearance. Not in distress.  Neck:     Vascular: JVD normal.  Pulmonary:     Effort: Pulmonary effort is normal.     Breath sounds: No wheezing. No rales.  Cardiovascular:     Normal rate. Regular rhythm. Normal S1. Normal S2.      Murmurs: There is no murmur.  Edema:    Peripheral edema present.    Pretibial: bilateral trace edema of the pretibial area. Abdominal:     Palpations: Abdomen is soft.  Skin:    General: Skin is warm and dry.  Neurological:     General: No focal  deficit present.     Mental Status: Alert and oriented to person, place and time.     Cranial Nerves: Cranial nerves are intact.         ASSESSMENT & PLAN:    1. Coronary artery disease involving native coronary artery of native heart with angina pectoris [facility]) History of inferior MI in 2002 treated with PCI, Taxus DES to the LAD in 2004, Cypher DES to the LAD and Cypher DES to the LCx in 2006.  He had patent stents by cardiac catheterization 2011.  Myoview in 2020 was low risk.  He recently was admitted with chest pain.  Troponins were negative.  Echocardiogram demonstrated normal LV function.  His symptoms were consistent with noncardiac chest pain.  He notes continued episodes of chest discomfort from time to time that are relieved by taking omeprazole.  He has not taken nitroglycerin in years.  Overall, his symptoms seem to be stable.  No further testing is indicated at this time.  Continue clopidogrel, simvastatin.  Follow-up in 6 months.  2. Carotid stenosis, asymptomatic, bilateral Recent carotid ultrasound performed 7/22 with stable carotid disease.  Continue clopidogrel, simvastatin.  Follow-up in 1 year.  3. Essential hypertension He has a history of chronic dizziness.  He is going to vestibular rehabilitation.  His blood pressure is somewhat borderline low.  I have asked him to monitor his blood pressures at home.  If his pressures continue to run low, we may want to cut back on his ramipril to 5 mg daily.  4. Mixed hyperlipidemia LDL optimal on most recent lab work.  Continue current Rx.    5. Aortic atherosclerosis (HCC) Continue clopidogrel, simvastatin.  Dispo:  Return in about 6 months (around 02/16/2021) for Routine Follow Up,  w/ Dr. Burt Knack, or Richardson Dopp, PA-C.   Medication Adjustments/Labs and Tests Ordered: Current medicines are reviewed at length with the patient today.  Concerns regarding medicines are outlined above.  Tests Ordered: No orders of the defined types  were placed in this encounter.  Medication Changes: No orders of the defined types were placed in this encounter.   Signed, Richardson Dopp, PA-C  08/16/2020 12:17 PM    [facility] [place], [place], [place]  [address] Phone: [phone]; Fax: [phone]

## 2020-08-16 ENCOUNTER — Ambulatory Visit: Payer: Medicare PPO | Admitting: Physician Assistant

## 2020-08-16 ENCOUNTER — Other Ambulatory Visit: Payer: Self-pay

## 2020-08-16 ENCOUNTER — Encounter: Payer: Self-pay | Admitting: Physician Assistant

## 2020-08-16 VITALS — BP 110/42 | HR 60 | Ht 66.0 in | Wt 197.2 lb

## 2020-08-16 DIAGNOSIS — I25119 Atherosclerotic heart disease of native coronary artery with unspecified angina pectoris: Secondary | ICD-10-CM | POA: Diagnosis not present

## 2020-08-16 DIAGNOSIS — E782 Mixed hyperlipidemia: Secondary | ICD-10-CM | POA: Diagnosis not present

## 2020-08-16 DIAGNOSIS — I10 Essential (primary) hypertension: Secondary | ICD-10-CM | POA: Diagnosis not present

## 2020-08-16 DIAGNOSIS — I6523 Occlusion and stenosis of bilateral carotid arteries: Secondary | ICD-10-CM

## 2020-08-16 DIAGNOSIS — I700 Atherosclerosis of aorta: Secondary | ICD-10-CM | POA: Diagnosis not present

## 2020-08-16 NOTE — Patient Instructions (Signed)
Medication Instructions:   Your physician recommends that you continue on your current medications as directed. Please refer to the Current Medication list given to you today.  *If you need a refill on your cardiac medications before your next appointment, please call your pharmacy*   Lab Work:  -NONE  If you have labs (blood work) drawn today and your tests are completely normal, you will receive your results only by: [place] (if you have MyChart) OR A paper copy in the mail If you have any lab test that is abnormal or we need to change your treatment, we will call you to review the results.   Testing/Procedures:  -NONE-  Follow-Up: At [facility], you and your health needs are our priority.  As part of our continuing mission to provide you with exceptional heart care, we have created designated Provider Care Teams.  These Care Teams include your primary Cardiologist (physician) and Advanced Practice Providers (APPs -  Physician Assistants and Nurse Practitioners) who all work together to provide you with the care you need, when you need it.  We recommend signing up for the patient portal called "MyChart".  Sign up information is provided on this After Visit Summary.  MyChart is used to connect with patients for Virtual Visits (Telemedicine).  Patients are able to view lab/test results, encounter notes, upcoming appointments, etc.  Non-urgent messages can be sent to your provider as well.   To learn more about what you can do with MyChart, go to [email or web address].    Your next appointment:   6 month(s) with Dr. Burt Knack on Friday, February 10 @ 11:00 am.   The format for your next appointment:   In Person  Provider:   Sherren Mocha, MD   Other Instructions

## 2020-08-30 DIAGNOSIS — E1151 Type 2 diabetes mellitus with diabetic peripheral angiopathy without gangrene: Secondary | ICD-10-CM | POA: Diagnosis not present

## 2020-08-30 DIAGNOSIS — E7800 Pure hypercholesterolemia, unspecified: Secondary | ICD-10-CM | POA: Diagnosis not present

## 2020-08-30 DIAGNOSIS — Z125 Encounter for screening for malignant neoplasm of prostate: Secondary | ICD-10-CM | POA: Diagnosis not present

## 2020-09-06 DIAGNOSIS — E1151 Type 2 diabetes mellitus with diabetic peripheral angiopathy without gangrene: Secondary | ICD-10-CM | POA: Diagnosis not present

## 2020-09-06 DIAGNOSIS — D692 Other nonthrombocytopenic purpura: Secondary | ICD-10-CM | POA: Diagnosis not present

## 2020-09-06 DIAGNOSIS — Z9861 Coronary angioplasty status: Secondary | ICD-10-CM | POA: Diagnosis not present

## 2020-09-06 DIAGNOSIS — C61 Malignant neoplasm of prostate: Secondary | ICD-10-CM | POA: Diagnosis not present

## 2020-09-06 DIAGNOSIS — I119 Hypertensive heart disease without heart failure: Secondary | ICD-10-CM | POA: Diagnosis not present

## 2020-09-06 DIAGNOSIS — I25118 Atherosclerotic heart disease of native coronary artery with other forms of angina pectoris: Secondary | ICD-10-CM | POA: Diagnosis not present

## 2020-09-06 DIAGNOSIS — E7800 Pure hypercholesterolemia, unspecified: Secondary | ICD-10-CM | POA: Diagnosis not present

## 2020-09-06 DIAGNOSIS — R82998 Other abnormal findings in urine: Secondary | ICD-10-CM | POA: Diagnosis not present

## 2020-09-06 DIAGNOSIS — Z0000 Encounter for general adult medical examination without abnormal findings: Secondary | ICD-10-CM | POA: Diagnosis not present

## 2020-09-06 DIAGNOSIS — I6529 Occlusion and stenosis of unspecified carotid artery: Secondary | ICD-10-CM | POA: Diagnosis not present

## 2020-09-08 ENCOUNTER — Telehealth: Payer: Self-pay | Admitting: Physician Assistant

## 2020-09-08 NOTE — Telephone Encounter (Signed)
Reviewed BP readings received from pt. Most BP readings are optimal. PLAN:  Continue current medications. Richardson Dopp, PA-C    09/08/2020 1:18 PM

## 2020-10-19 DIAGNOSIS — G5621 Lesion of ulnar nerve, right upper limb: Secondary | ICD-10-CM | POA: Diagnosis not present

## 2020-10-19 DIAGNOSIS — M1812 Unilateral primary osteoarthritis of first carpometacarpal joint, left hand: Secondary | ICD-10-CM | POA: Diagnosis not present

## 2020-10-19 DIAGNOSIS — G5603 Carpal tunnel syndrome, bilateral upper limbs: Secondary | ICD-10-CM | POA: Diagnosis not present

## 2021-01-31 DIAGNOSIS — E349 Endocrine disorder, unspecified: Secondary | ICD-10-CM | POA: Diagnosis not present

## 2021-01-31 DIAGNOSIS — C61 Malignant neoplasm of prostate: Secondary | ICD-10-CM | POA: Diagnosis not present

## 2021-02-02 ENCOUNTER — Telehealth: Payer: Self-pay | Admitting: Physician Assistant

## 2021-02-02 ENCOUNTER — Ambulatory Visit (INDEPENDENT_AMBULATORY_CARE_PROVIDER_SITE_OTHER): Payer: Medicare PPO

## 2021-02-02 DIAGNOSIS — R001 Bradycardia, unspecified: Secondary | ICD-10-CM

## 2021-02-02 NOTE — Telephone Encounter (Signed)
Pt aware and order entered ./cy

## 2021-02-02 NOTE — Telephone Encounter (Signed)
Received note from Docia Chuck, NP with [facility].  She saw [name] Ray recently.  She notes [name] Ray had symptoms of dizziness and fatigue and his HR was recorded in the low 50s.  He does not take any AV nodal blocking agents.   PLAN:  -Schedule 14 day Zio (Dx: bradycardia) Richardson Dopp, PA-C    02/02/2021 1:22 PM

## 2021-02-02 NOTE — Progress Notes (Unsigned)
Enrolled patient for a 14 day Zio XT  monitor to be mailed to patients home  °

## 2021-02-06 DIAGNOSIS — R001 Bradycardia, unspecified: Secondary | ICD-10-CM

## 2021-02-23 ENCOUNTER — Ambulatory Visit: Payer: Medicare PPO | Admitting: Cardiovascular Disease

## 2021-02-26 ENCOUNTER — Encounter: Payer: Self-pay | Admitting: Cardiovascular Disease

## 2021-02-26 ENCOUNTER — Other Ambulatory Visit: Payer: Self-pay

## 2021-02-26 ENCOUNTER — Ambulatory Visit: Payer: Medicare PPO | Admitting: Cardiovascular Disease

## 2021-02-26 VITALS — BP 134/60 | HR 54 | Ht 66.0 in | Wt 200.0 lb

## 2021-02-26 DIAGNOSIS — R001 Bradycardia, unspecified: Secondary | ICD-10-CM

## 2021-02-26 DIAGNOSIS — I10 Essential (primary) hypertension: Secondary | ICD-10-CM | POA: Diagnosis not present

## 2021-02-26 DIAGNOSIS — E782 Mixed hyperlipidemia: Secondary | ICD-10-CM | POA: Diagnosis not present

## 2021-02-26 DIAGNOSIS — I25119 Atherosclerotic heart disease of native coronary artery with unspecified angina pectoris: Secondary | ICD-10-CM

## 2021-02-26 NOTE — Patient Instructions (Signed)
Medication Instructions:  Your physician recommends that you continue on your current medications as directed. Please refer to the Current Medication list given to you today.  *If you need a refill on your cardiac medications before your next appointment, please call your pharmacy*   Lab Work: NONE If you have labs (blood work) drawn today and your tests are completely normal, you will receive your results only by: [place] (if you have MyChart) OR A paper copy in the mail If you have any lab test that is abnormal or we need to change your treatment, we will call you to review the results.   Testing/Procedures: NONE   Follow-Up: At [facility], you and your health needs are our priority.  As part of our continuing mission to provide you with exceptional heart care, we have created designated Provider Care Teams.  These Care Teams include your primary Cardiologist (physician) and Advanced Practice Providers (APPs -  Physician Assistants and Nurse Practitioners) who all work together to provide you with the care you need, when you need it.  Your next appointment:   6 month(s)  The format for your next appointment:   In Person  Provider:   Richardson Dopp, PA-C     Then, Sherren Mocha, MD will plan to see you again in 1 year(s).   Thank you for allowing [place] at [facility] to serve you, God Bless!!

## 2021-02-26 NOTE — Progress Notes (Signed)
Cardiology Office Note:    Date:  02/27/2021   ID:  [name], DOB [birth year], MRN [id number]  PCP:  Haywood Pao, MD   [facility] Providers Cardiologist:  Sherren Mocha, MD Cardiology APP:  Sharmon Revere     Referring MD: Haywood Pao, MD   Chief Complaint  Patient presents with   Coronary Artery Disease    History of Present Illness:    [name] Ray is a 88 y.o. male with a hx of: Coronary artery disease S/p Inf MI in 2002 tx with PCI S/p Taxus DES to LAD in 2004 S/p Cypher DES to pLAD and Cypher DES to [facility] in 05/2004 LHC in 2011 - stents patent Myoview 07/2018: low risk  Diabetes Mellitus Hypertension Hyperlipidemia Chronic kidney disease II-III Carotid artery disease [place] 07/2018: bilat ICA 1-39; R subcl stenosis  BPH Prostate CA s/p radiation Aortic atherosclerosis   The patient is here with his wife today.  He has had some fatigue and dizziness.  He mentioned this to his primary care physician and a ZIO monitor was ordered.  He has mailed this and but the results are pending and monitor review is not currently available.  The patient describes dizziness with positional changes.  This can occur with turning his head quickly.  He actually has a longstanding history of dizziness and he has undergone vestibular therapy in the past.  He has not had presyncope or frank syncope.  He has had no orthopnea, PND, or recent heart palpitations.  He has experienced some mild discomfort in his chest but states that this has been self-limited and not required any specific therapy.  He has not had exertional chest pain or pressure.  Past Medical History:  Diagnosis Date   Adenomatous polyp    history   Arthritis    knees , shoulders, hands    Atrial bigeminy    in the past   BPH (benign prostatic hyperplasia)    Carotid artery disease ([place])    a. Doppler, January, 2012, 40-59% bilateral stenoses;  b.  Carotids 4/14:  40-59% bilat ICA, occluded R  vertebral artery; f/u 1 year // Carotid US 07/2018:  Bilat ICA 1-39; R subclavian stenosis >> rpt in 1 year  // Carotid US 07/2019: Bilateral ICA 40-59; right subclavian stenosis; repeat 1 year // Carotid US 1/02: LICA 72-53, bilat VA with delayed acceleration times, R subclavian stenosis.   Colon polyps    Coronary artery disease    acute inferior MI Jan 2002 with stent / Taxus stent LAD 2004 / Cypher stent prox LAD and cypher stent mid circ May 2006 / nuclear stress Dec 2009 normal EF 63% / nuclear August 2011 EF 62% / inferior thining and very mild ischemia inferior and apex / catherization Sept 2011 EF 60%  mild to moderate diffuse RCA disease, moderate discrete LAD stenosis before the stent in the proximal LAD med tx rec   Diverticulosis    Dyslipidemia    Ejection fraction    Fatty liver    history   GERD (gastroesophageal reflux disease)    Hypertension    [facility] - 5 stents total , last cardiac cath. 2011.  Cleared for  surgery by Dr. [name]- 04/23/2013   IBS (irritable bowel syndrome)    Myocardial infarction ([place]) 2002   Myoview    Myoview 07/2018: EF 63, inferior artifact, no ischemia or scar; Low Risk   Numbness and tingling in hands  resolved   Overweight(278.02)    Prostate cancer ([place]) approx 2016   treated /w  Lupron injection x1, then radium   Shortness of breath dyspnea    Type II diabetes mellitus ([place])     Past Surgical History:  Procedure Laterality Date   CARDIAC CATHETERIZATION     last cath- 2006   COLONOSCOPY     CORONARY ANGIOPLASTY WITH STENT PLACEMENT     multiple stents in LAD:  "I've got 5 stents" (05/24/2013)   EYE SURGERY Bilateral    cataract surgery with lens implants   LUMBAR LAMINECTOMY/DECOMPRESSION MICRODISCECTOMY N/A 09/11/2017   Procedure: LAMINECTOMY AND FORAMINOTOMY LUMBAR One-Two, Two-Three, Three-Four, Four-Five, Five-Sacral One;  Surgeon: Newman Pies, MD;  Location: [place];  Service: Neurosurgery;  Laterality: N/A;  LAMINECTOMY AND  FORAMINOTOMY LUMBAR One-Two,Two-Three, Three-Four, Four-Five, Five-Sacral One   NEUROPLASTY / TRANSPOSITION MEDIAN NERVE AT CARPAL TUNNEL Left    also surgery on thumb, elbow- nerve release    PROSTATE BIOPSY     followed later by HDR   TOTAL HIP ARTHROPLASTY Left 01/26/2018   Procedure: TOTAL HIP ARTHROPLASTY ANTERIOR APPROACH;  Surgeon: [name], MD;  Location: [facility];  Service: Orthopedics;  Laterality: Left;  118min   TOTAL KNEE ARTHROPLASTY Right 05/24/2013   TOTAL KNEE ARTHROPLASTY Right 05/24/2013   Procedure: RIGHT TOTAL KNEE ARTHROPLASTY;  Surgeon: [name], MD;  Location: [place];  Service: Orthopedics;  Laterality: Right;   TOTAL KNEE ARTHROPLASTY Left 02/14/2014   Procedure: LEFT TOTAL KNEE ARTHROPLASTY;  Surgeon: [name], MD;  Location: [place];  Service: Orthopedics;  Laterality: Left;    Current Medications: Current Meds  Medication Sig   acetaminophen (TYLENOL) 500 MG tablet Take 1 tablet (500 mg total) by mouth every 6 (six) hours as needed for mild pain.   amLODipine (NORVASC) 5 MG tablet Take 1 tablet (5 mg total) by mouth daily.   amoxicillin (AMOXIL) 500 MG tablet Take by mouth. Prior to dental appointment   clopidogrel (PLAVIX) 75 MG tablet Take 75 mg by mouth daily.   Guaifenesin 1200 MG TB12 Take 1,200 mg by mouth daily as needed (cough).   JENTADUETO XR 2.05-998 MG TB24 Take 1 tablet by mouth daily.   loperamide (IMODIUM A-D) 2 MG tablet Take 2 mg by mouth 4 (four) times daily as needed for diarrhea or loose stools.   loratadine (CLARITIN) 10 MG tablet Take 10 mg by mouth daily as needed for allergies.   Multiple Vitamin (MULTI-VITAMIN) tablet Take 1 tablet by mouth daily.   nitroGLYCERIN (NITROSTAT) 0.4 MG SL tablet Place 1 tablet (0.4 mg total) under the tongue every 5 (five) minutes as needed for chest pain. Please keep upcoming appt. Thank you   omeprazole (PRILOSEC) 20 MG capsule Take 20 mg by mouth daily.   ramipril (ALTACE) 10 MG capsule Take 10 mg by  mouth daily after breakfast.   simvastatin (ZOCOR) 20 MG tablet Take 20 mg by mouth at bedtime.   tamsulosin (FLOMAX) 0.4 MG CAPS capsule Take 0.4 mg by mouth at bedtime.     Allergies:   Atorvastatin, Rosuvastatin, and Clindamycin   Social History   Socioeconomic History   Marital status: Married    Spouse name: Not on file   Number of children: Not on file   Years of education: Not on file   Highest education level: Not on file  Occupational History   Not on file  Tobacco Use   Smoking status: Never   Smokeless tobacco: Never  Vaping Use   Vaping Use: Never used  Substance and Sexual Activity   Alcohol use: No   Drug use: No   Sexual activity: Not Currently  Other Topics Concern   Not on file  Social History Narrative   Not on file   Social Determinants of Health   Financial Resource Strain: Not on file  Food Insecurity: Not on file  Transportation Needs: Not on file  Physical Activity: Not on file  Stress: Not on file  Social Connections: Not on file     Family History: The patient's family history includes Heart attack in his paternal grandfather; Heart disease in his brother and father; Hypertension in his father.  ROS:   Please see the history of present illness.    All other systems reviewed and are negative.  EKGs/Labs/Other Studies Reviewed:    The following studies were reviewed today: Stress Test 07/22/2018: Nuclear stress EF: 63%. The left ventricular ejection fraction is normal (55-65%). There was no ST segment deviation noted during stress. Defect 1: There is a small defect of mild severity present in the basal inferior and mid inferior location. This is consistent with artifart. This is a low risk study. No evidence of ischemia or infarction The study is normal.  Echo 07/04/2020:  1. Left ventricular ejection fraction, by estimation, is 55 to 60%. The  left ventricle has normal function. The left ventricle has no regional  wall motion  abnormalities. There is mild left ventricular hypertrophy.  Left ventricular diastolic parameters  are consistent with Grade I diastolic dysfunction (impaired relaxation).   2. Right ventricular systolic function is normal. The right ventricular  size is normal.   3. The mitral valve is normal in structure. No evidence of mitral valve  regurgitation. No evidence of mitral stenosis.   4. The aortic valve is tricuspid. Aortic valve regurgitation is not  visualized. No aortic stenosis is present.   EKG:  EKG is ordered today.  The ekg ordered today demonstrates sinus bradycardia 54 bpm, otherwise within normal limits  Recent Labs: 07/03/2020: B Natriuretic Peptide 141.6 07/05/2020: ALT 42; TSH 3.203 07/06/2020: BUN 24; Creatinine, Ser 1.28; Hemoglobin 10.3; Platelets 80; Potassium 4.2; Sodium 132  Recent Lipid Panel    Component Value Date/Time   CHOL 122 07/04/2020 1107   TRIG 97 07/04/2020 1107   HDL 52 07/04/2020 1107   CHOLHDL 2.3 07/04/2020 1107   VLDL 19 07/04/2020 1107   LDLCALC 51 07/04/2020 1107     Risk Assessment/Calculations:           Physical Exam:    VS:  BP 134/60    Pulse (!) 54    Ht 5\' 6"  (1.676 m)    Wt 200 lb (90.7 kg)    SpO2 97%    BMI 32.28 kg/m     Wt Readings from Last 3 Encounters:  02/26/21 200 lb (90.7 kg)  08/16/20 197 lb 3.2 oz (89.4 kg)  07/03/20 197 lb (89.4 kg)     GEN:  Well nourished, well developed elderly male in no acute distress HEENT: Normal NECK: No JVD; No carotid bruits LYMPHATICS: No lymphadenopathy CARDIAC: RRR, no murmurs, rubs, gallops RESPIRATORY:  Clear to auscultation without rales, wheezing or rhonchi  ABDOMEN: Soft, non-tender, non-distended MUSCULOSKELETAL:  No edema; No deformity  SKIN: Warm and dry NEUROLOGIC:  Alert and oriented x 3 PSYCHIATRIC:  Normal affect   ASSESSMENT:    1. Coronary artery disease involving native coronary artery of native heart with angina  pectoris ([place])   2. Bradycardia   3.  Essential hypertension   4. Mixed hyperlipidemia    PLAN:    In order of problems listed above:  He continues on amlodipine.  Bradycardia will not allow for use of a beta-blocker.  I offered him stress testing, but he declined.  His last stress test is reviewed from 2020 and it was a normal study with LVEF of 55 to 65% and no perfusion abnormalities.  He continues on clopidogrel. We will review ZIO monitor when available.  Suspect his dizziness is not related to arrhythmia.  There may be a postural component and he is advised to stay well-hydrated.  He will have follow-up in 6 months. Blood pressure is well controlled.  Continue amlodipine.  Most recent labs reviewed. Treated with simvastatin. LDL cholesterol is 51.           Medication Adjustments/Labs and Tests Ordered: Current medicines are reviewed at length with the patient today.  Concerns regarding medicines are outlined above.  Orders Placed This Encounter  Procedures   EKG 12-Lead   No orders of the defined types were placed in this encounter.   Patient Instructions  Medication Instructions:  Your physician recommends that you continue on your current medications as directed. Please refer to the Current Medication list given to you today.  *If you need a refill on your cardiac medications before your next appointment, please call your pharmacy*   Lab Work: NONE If you have labs (blood work) drawn today and your tests are completely normal, you will receive your results only by: [place] (if you have MyChart) OR A paper copy in the mail If you have any lab test that is abnormal or we need to change your treatment, we will call you to review the results.   Testing/Procedures: NONE   Follow-Up: At [facility], you and your health needs are our priority.  As part of our continuing mission to provide you with exceptional heart care, we have created designated Provider Care Teams.  These Care Teams include  your primary Cardiologist (physician) and Advanced Practice Providers (APPs -  Physician Assistants and Nurse Practitioners) who all work together to provide you with the care you need, when you need it.  Your next appointment:   6 month(s)  The format for your next appointment:   In Person  Provider:   Richardson Dopp, PA-C     Then, Sherren Mocha, MD will plan to see you again in 1 year(s).   Thank you for allowing [place] at [facility] to serve you, God Bless!!    Signed, Sherren Mocha, MD  02/27/2021 1:54 PM    [facility]

## 2021-02-27 ENCOUNTER — Encounter: Payer: Self-pay | Admitting: Cardiovascular Disease

## 2021-02-28 DIAGNOSIS — R001 Bradycardia, unspecified: Secondary | ICD-10-CM | POA: Diagnosis not present

## 2021-03-08 DIAGNOSIS — E113293 Type 2 diabetes mellitus with mild nonproliferative diabetic retinopathy without macular edema, bilateral: Secondary | ICD-10-CM | POA: Diagnosis not present

## 2021-03-08 DIAGNOSIS — H40013 Open angle with borderline findings, low risk, bilateral: Secondary | ICD-10-CM | POA: Diagnosis not present

## 2021-03-08 DIAGNOSIS — H524 Presbyopia: Secondary | ICD-10-CM | POA: Diagnosis not present

## 2021-03-08 DIAGNOSIS — H04123 Dry eye syndrome of bilateral lacrimal glands: Secondary | ICD-10-CM | POA: Diagnosis not present

## 2021-03-08 DIAGNOSIS — H26493 Other secondary cataract, bilateral: Secondary | ICD-10-CM | POA: Diagnosis not present

## 2021-03-16 DIAGNOSIS — E11319 Type 2 diabetes mellitus with unspecified diabetic retinopathy without macular edema: Secondary | ICD-10-CM | POA: Diagnosis not present

## 2021-03-16 DIAGNOSIS — E538 Deficiency of other specified B group vitamins: Secondary | ICD-10-CM | POA: Diagnosis not present

## 2021-03-16 DIAGNOSIS — Z9861 Coronary angioplasty status: Secondary | ICD-10-CM | POA: Diagnosis not present

## 2021-03-16 DIAGNOSIS — E7800 Pure hypercholesterolemia, unspecified: Secondary | ICD-10-CM | POA: Diagnosis not present

## 2021-03-16 DIAGNOSIS — C61 Malignant neoplasm of prostate: Secondary | ICD-10-CM | POA: Diagnosis not present

## 2021-03-16 DIAGNOSIS — K219 Gastro-esophageal reflux disease without esophagitis: Secondary | ICD-10-CM | POA: Diagnosis not present

## 2021-03-16 DIAGNOSIS — I119 Hypertensive heart disease without heart failure: Secondary | ICD-10-CM | POA: Diagnosis not present

## 2021-03-16 DIAGNOSIS — I6529 Occlusion and stenosis of unspecified carotid artery: Secondary | ICD-10-CM | POA: Diagnosis not present

## 2021-03-16 DIAGNOSIS — I25118 Atherosclerotic heart disease of native coronary artery with other forms of angina pectoris: Secondary | ICD-10-CM | POA: Diagnosis not present

## 2021-03-16 DIAGNOSIS — E1151 Type 2 diabetes mellitus with diabetic peripheral angiopathy without gangrene: Secondary | ICD-10-CM | POA: Diagnosis not present

## 2021-05-02 DIAGNOSIS — M5416 Radiculopathy, lumbar region: Secondary | ICD-10-CM | POA: Diagnosis not present

## 2021-05-22 DIAGNOSIS — M25511 Pain in right shoulder: Secondary | ICD-10-CM | POA: Diagnosis not present

## 2021-05-22 DIAGNOSIS — M5416 Radiculopathy, lumbar region: Secondary | ICD-10-CM | POA: Diagnosis not present

## 2021-05-24 DIAGNOSIS — M545 Low back pain, unspecified: Secondary | ICD-10-CM | POA: Diagnosis not present

## 2021-05-24 DIAGNOSIS — M542 Cervicalgia: Secondary | ICD-10-CM | POA: Diagnosis not present

## 2021-05-24 DIAGNOSIS — M79671 Pain in right foot: Secondary | ICD-10-CM | POA: Diagnosis not present

## 2021-06-04 DIAGNOSIS — M5416 Radiculopathy, lumbar region: Secondary | ICD-10-CM | POA: Diagnosis not present

## 2021-06-05 DIAGNOSIS — M545 Low back pain, unspecified: Secondary | ICD-10-CM | POA: Diagnosis not present

## 2021-06-19 DIAGNOSIS — M5416 Radiculopathy, lumbar region: Secondary | ICD-10-CM | POA: Diagnosis not present

## 2021-06-26 DIAGNOSIS — M5416 Radiculopathy, lumbar region: Secondary | ICD-10-CM | POA: Diagnosis not present

## 2021-06-26 DIAGNOSIS — M25511 Pain in right shoulder: Secondary | ICD-10-CM | POA: Diagnosis not present

## 2021-07-06 DIAGNOSIS — M545 Low back pain, unspecified: Secondary | ICD-10-CM | POA: Diagnosis not present

## 2021-07-10 ENCOUNTER — Telehealth: Payer: Self-pay | Admitting: Physical Medicine and Rehabilitation

## 2021-07-18 ENCOUNTER — Telehealth: Payer: Self-pay | Admitting: Physical Medicine and Rehabilitation

## 2021-07-18 NOTE — Telephone Encounter (Signed)
Received paper referral, patient is currently seeing Dr. Nelva Bush at [facility] and has an upcoming appointment with Dr. Arnoldo Morale. Patient informed that we do not manage chronic pain in our office. Patient would like to continue to see Dr. Nelva Bush. I did call [employer] and spoke with referral coordinator Anderson Malta).

## 2021-07-19 ENCOUNTER — Ambulatory Visit: Payer: Medicare PPO | Admitting: Physical Medicine and Rehabilitation

## 2021-07-20 DIAGNOSIS — M5417 Radiculopathy, lumbosacral region: Secondary | ICD-10-CM | POA: Diagnosis not present

## 2021-07-20 DIAGNOSIS — M48061 Spinal stenosis, lumbar region without neurogenic claudication: Secondary | ICD-10-CM | POA: Diagnosis not present

## 2021-07-20 DIAGNOSIS — M4807 Spinal stenosis, lumbosacral region: Secondary | ICD-10-CM | POA: Diagnosis not present

## 2021-07-20 DIAGNOSIS — M5416 Radiculopathy, lumbar region: Secondary | ICD-10-CM | POA: Diagnosis not present

## 2021-07-31 ENCOUNTER — Ambulatory Visit (HOSPITAL_COMMUNITY)
Admission: RE | Admit: 2021-07-31 | Discharge: 2021-07-31 | Disposition: A | Discharge status: Home / Self Care | Payer: Medicare PPO | Source: Ambulatory Visit | Attending: Cardiology | Admitting: Cardiology

## 2021-07-31 DIAGNOSIS — I6523 Occlusion and stenosis of bilateral carotid arteries: Secondary | ICD-10-CM | POA: Diagnosis not present

## 2021-08-01 ENCOUNTER — Encounter: Payer: Self-pay | Admitting: Physician Assistant

## 2021-08-20 DIAGNOSIS — M5416 Radiculopathy, lumbar region: Secondary | ICD-10-CM | POA: Diagnosis not present

## 2021-09-07 DIAGNOSIS — E7800 Pure hypercholesterolemia, unspecified: Secondary | ICD-10-CM | POA: Diagnosis not present

## 2021-09-07 DIAGNOSIS — E785 Hyperlipidemia, unspecified: Secondary | ICD-10-CM | POA: Diagnosis not present

## 2021-09-07 DIAGNOSIS — E1151 Type 2 diabetes mellitus with diabetic peripheral angiopathy without gangrene: Secondary | ICD-10-CM | POA: Diagnosis not present

## 2021-09-07 DIAGNOSIS — I25118 Atherosclerotic heart disease of native coronary artery with other forms of angina pectoris: Secondary | ICD-10-CM | POA: Diagnosis not present

## 2021-09-07 DIAGNOSIS — Z125 Encounter for screening for malignant neoplasm of prostate: Secondary | ICD-10-CM | POA: Diagnosis not present

## 2021-09-07 DIAGNOSIS — E538 Deficiency of other specified B group vitamins: Secondary | ICD-10-CM | POA: Diagnosis not present

## 2021-09-07 DIAGNOSIS — R7989 Other specified abnormal findings of blood chemistry: Secondary | ICD-10-CM | POA: Diagnosis not present

## 2021-09-07 DIAGNOSIS — I6529 Occlusion and stenosis of unspecified carotid artery: Secondary | ICD-10-CM | POA: Diagnosis not present

## 2021-09-14 DIAGNOSIS — I119 Hypertensive heart disease without heart failure: Secondary | ICD-10-CM | POA: Diagnosis not present

## 2021-09-14 DIAGNOSIS — I10 Essential (primary) hypertension: Secondary | ICD-10-CM | POA: Diagnosis not present

## 2021-09-14 DIAGNOSIS — I6529 Occlusion and stenosis of unspecified carotid artery: Secondary | ICD-10-CM | POA: Diagnosis not present

## 2021-09-14 DIAGNOSIS — E1151 Type 2 diabetes mellitus with diabetic peripheral angiopathy without gangrene: Secondary | ICD-10-CM | POA: Diagnosis not present

## 2021-09-14 DIAGNOSIS — E669 Obesity, unspecified: Secondary | ICD-10-CM | POA: Diagnosis not present

## 2021-09-14 DIAGNOSIS — R82998 Other abnormal findings in urine: Secondary | ICD-10-CM | POA: Diagnosis not present

## 2021-09-14 DIAGNOSIS — I25118 Atherosclerotic heart disease of native coronary artery with other forms of angina pectoris: Secondary | ICD-10-CM | POA: Diagnosis not present

## 2021-09-14 DIAGNOSIS — E7800 Pure hypercholesterolemia, unspecified: Secondary | ICD-10-CM | POA: Diagnosis not present

## 2021-09-14 DIAGNOSIS — M5416 Radiculopathy, lumbar region: Secondary | ICD-10-CM | POA: Diagnosis not present

## 2021-09-14 DIAGNOSIS — Z0000 Encounter for general adult medical examination without abnormal findings: Secondary | ICD-10-CM | POA: Diagnosis not present

## 2021-09-14 DIAGNOSIS — M25511 Pain in right shoulder: Secondary | ICD-10-CM | POA: Diagnosis not present

## 2021-09-25 DIAGNOSIS — M4807 Spinal stenosis, lumbosacral region: Secondary | ICD-10-CM | POA: Diagnosis not present

## 2021-09-25 DIAGNOSIS — R2681 Unsteadiness on feet: Secondary | ICD-10-CM | POA: Diagnosis not present

## 2021-09-25 DIAGNOSIS — M4317 Spondylolisthesis, lumbosacral region: Secondary | ICD-10-CM | POA: Diagnosis not present

## 2021-09-25 DIAGNOSIS — M4316 Spondylolisthesis, lumbar region: Secondary | ICD-10-CM | POA: Diagnosis not present

## 2021-10-10 ENCOUNTER — Other Ambulatory Visit (HOSPITAL_COMMUNITY): Payer: Self-pay | Admitting: Physician Assistant

## 2021-10-10 DIAGNOSIS — I779 Disorder of arteries and arterioles, unspecified: Secondary | ICD-10-CM

## 2021-11-27 ENCOUNTER — Ambulatory Visit: Payer: Medicare PPO | Attending: Physician Assistant | Admitting: Physician Assistant

## 2021-11-27 ENCOUNTER — Encounter: Payer: Self-pay | Admitting: Physician Assistant

## 2021-11-27 VITALS — BP 148/70 | HR 72 | Ht 66.0 in | Wt 206.4 lb

## 2021-11-27 DIAGNOSIS — I441 Atrioventricular block, second degree: Secondary | ICD-10-CM

## 2021-11-27 DIAGNOSIS — I25119 Atherosclerotic heart disease of native coronary artery with unspecified angina pectoris: Secondary | ICD-10-CM | POA: Diagnosis not present

## 2021-11-27 DIAGNOSIS — I10 Essential (primary) hypertension: Secondary | ICD-10-CM | POA: Diagnosis not present

## 2021-11-27 DIAGNOSIS — K219 Gastro-esophageal reflux disease without esophagitis: Secondary | ICD-10-CM

## 2021-11-27 DIAGNOSIS — E7800 Pure hypercholesterolemia, unspecified: Secondary | ICD-10-CM | POA: Diagnosis not present

## 2021-11-27 DIAGNOSIS — I6523 Occlusion and stenosis of bilateral carotid arteries: Secondary | ICD-10-CM

## 2021-11-27 NOTE — Progress Notes (Signed)
Cardiology Office Note:    Date:  11/27/2021   ID:  [name], DOB 08-Nov-1933, MRN [id number]  PCP:  Haywood Pao, MD  [facility] Providers Cardiologist:  Sherren Mocha, MD Cardiology APP:  Sharmon Revere    Referring MD: Haywood Pao, MD   Chief Complaint:  Follow-up for CAD    Patient Profile: Coronary artery disease S/p Inf MI in 2002 tx with PCI S/p Taxus DES to LAD in 2004 S/p Cypher DES to pLAD and Cypher DES to mLCx in 05/2004 LHC in 2011 - RCA, LCx and LAD stents patent (mild to mod non-obs dz in LAD, LCx, RCA) Myoview 07/2018: low risk  Echo 07/04/20: EF 55-60, GR 1 DD, no RWMA, mild LVH, normal RVSF  Diabetes Mellitus Hypertension Hyperlipidemia Chronic kidney disease II-III Carotid artery disease [place] 07/2018: bilat ICA 1-39; R subcl stenosis  [place] 07/26/20: L 40-59; ? Prox bilat VA stenosis; R subclavian stenosis  [place] 1/74/9449: R ICA 6-75, LICA 91-63 (no VA or subclavian stenosis noted) BPH Prostate CA s/p radiation Aortic atherosclerosis  Mobitz I  Monitor 02/2021: NSR, occ Mobitz I, no high grade HB, no sustained arrhythmias, AFib/Flutter  Cardiac Studies & Procedures     STRESS TESTS  MYOCARDIAL PERFUSION IMAGING 07/22/2018  Narrative  Nuclear stress EF: 63%. The left ventricular ejection fraction is normal (55-65%).  There was no ST segment deviation noted during stress.  Defect 1: There is a small defect of mild severity present in the basal inferior and mid inferior location. This is consistent with artifart.  This is a low risk study. No evidence of ischemia or infarction  The study is normal.   ECHOCARDIOGRAM  ECHOCARDIOGRAM COMPLETE 07/04/2020  Narrative ECHOCARDIOGRAM REPORT    Patient Name:   [name] Ray Date of Exam: 07/04/2020 Medical Rec #:  [id number]       Height:       66.0 in Accession #:    [id number]      Weight:       197.0 lb Date of Birth:  1933-04-05        BSA:          1.987 m Patient  Age:    24 years        BP:           151/62 mmHg Patient Gender: M               HR:           69 bpm. Exam Location:  Inpatient  Procedure: 2D Echo, Color Doppler and Cardiac Doppler  Indications:    R07.9* Chest pain, unspecified  History:        Patient has prior history of Echocardiogram examinations, most recent 05/06/2014.  Sonographer:    Merrie Roof RDCS Referring Phys: [id number] [place]   1. Left ventricular ejection fraction, by estimation, is 55 to 60%. The left ventricle has normal function. The left ventricle has no regional wall motion abnormalities. There is mild left ventricular hypertrophy. Left ventricular diastolic parameters are consistent with Grade I diastolic dysfunction (impaired relaxation). 2. Right ventricular systolic function is normal. The right ventricular size is normal. 3. The mitral valve is normal in structure. No evidence of mitral valve regurgitation. No evidence of mitral stenosis. 4. The aortic valve is tricuspid. Aortic valve regurgitation is not visualized. No aortic stenosis is present.  FINDINGS Left Ventricle: Left ventricular ejection fraction, by estimation, is  55 to 60%. The left ventricle has normal function. The left ventricle has no regional wall motion abnormalities. The left ventricular internal cavity size was normal in size. There is mild left ventricular hypertrophy. Left ventricular diastolic parameters are consistent with Grade I diastolic dysfunction (impaired relaxation).  Right Ventricle: The right ventricular size is normal. Right ventricular systolic function is normal.  Left Atrium: Left atrial size was normal in size.  Right Atrium: Right atrial size was normal in size.  Pericardium: There is no evidence of pericardial effusion.  Mitral Valve: The mitral valve is normal in structure. No evidence of mitral valve regurgitation. No evidence of mitral valve stenosis.  Tricuspid Valve: The tricuspid  valve is normal in structure. Tricuspid valve regurgitation is trivial. No evidence of tricuspid stenosis.  Aortic Valve: The aortic valve is tricuspid. Aortic valve regurgitation is not visualized. No aortic stenosis is present. Aortic valve mean gradient measures 7.0 mmHg. Aortic valve peak gradient measures 10.8 mmHg. Aortic valve area, by VTI measures 1.95 cm.  Pulmonic Valve: The pulmonic valve was normal in structure. Pulmonic valve regurgitation is not visualized. No evidence of pulmonic stenosis.  Aorta: The aortic root is normal in size and structure.  Venous: The inferior vena cava was not well visualized.  IAS/Shunts: The interatrial septum was not well visualized.   LEFT VENTRICLE PLAX 2D LVIDd:         4.10 cm  Diastology LVIDs:         2.80 cm  LV e' medial:    7.07 cm/s LV PW:         1.20 cm  LV E/e' medial:  9.5 LV IVS:        1.10 cm  LV e' lateral:   6.64 cm/s LVOT diam:     2.00 cm  LV E/e' lateral: 10.2 LV SV:         73 LV SV Index:   37 LVOT Area:     3.14 cm   RIGHT VENTRICLE RV Basal diam:  4.20 cm RV Mid diam:    3.80 cm  LEFT ATRIUM             Index       RIGHT ATRIUM           Index LA diam:        4.30 cm 2.16 cm/m  RA Area:     19.00 cm LA Vol (A2C):   61.7 ml 31.05 ml/m RA Volume:   45.50 ml  22.90 ml/m LA Vol (A4C):   51.2 ml 25.77 ml/m LA Biplane Vol: 55.7 ml 28.03 ml/m AORTIC VALVE AV Area (Vmax):    1.91 cm AV Area (Vmean):   1.81 cm AV Area (VTI):     1.95 cm AV Vmax:           164.00 cm/s AV Vmean:          125.000 cm/s AV VTI:            0.374 m AV Peak Grad:      10.8 mmHg AV Mean Grad:      7.0 mmHg LVOT Vmax:         99.80 cm/s LVOT Vmean:        72.100 cm/s LVOT VTI:          0.232 m LVOT/AV VTI ratio: 0.62  AORTA Ao Root diam: 3.50 cm Ao Asc diam:  3.00 cm  MITRAL VALVE MV Area (PHT): 2.66 cm  SHUNTS MV Decel Time: 285 msec     Systemic VTI:  0.23 m MV E velocity: 67.40 cm/s   Systemic Diam: 2.00 cm MV  A velocity: 123.00 cm/s MV E/A ratio:  0.55  Kirk Ruths MD Electronically signed by Kirk Ruths MD Signature Date/Time: 07/04/2020/2:58:23 PM    Final    MONITORS  LONG TERM MONITOR (3-14 DAYS) 02/28/2021  Narrative Patch Wear Time:  14 days and 0 hours (2023-01-24T12:00:12-498 to 2023-02-07T12:00:16-0500)  Patient had a min HR of 28 bpm, max HR of 158 bpm, and avg HR of 66 bpm. Predominant underlying rhythm was Sinus Rhythm. 1 run of Supraventricular Tachycardia occurred lasting 6 beats with a max rate of 158 bpm (avg 149 bpm). Second Degree AV Block-Mobitz I (Wenckebach) was present. Isolated SVEs were rare (<1.0%), SVE Couplets were rare (<1.0%), and SVE Triplets were rare (<1.0%). Isolated VEs were rare (<1.0%, 678), VE Couplets were rare (<1.0%, 30), and VE Triplets were rare (<1.0%, 2).  SUMMARY: The basic rhythm is normal sinus with an average heart rate of 66 bpm.  Occasional Mobitz 1 AV block noted.  No high-grade heart block.  No sustained arrhythmias.  No atrial fibrillation or flutter.             History of Present Illness:   [name] Ray is a 88 y.o. male with the above problem list.  He was last seen by Dr. Burt Knack in Feb 2023. He had monitor placed by his PCP for dizziness. This did not demonstrate any arrhythmia. He did have occasional 2nd degree AVB Type I.  Of note, he does not take any AV nodal blocking agents.   He returns for f/u.  He is here alone.  He has had significant issues with his back.  He has a pinched nerve.  He did see orthopedics and was treated medically.  His symptoms have improved and he thinks that he will not require surgery.  He continues to have occasional left-sided chest discomfort.  This occurs more with emotional stress than anything.  He has not had exertional chest pain.  He has not had to use nitroglycerin.  He has not had any radiating symptoms or associated nausea.  He has chronic shortness of breath with exertion.  This is  unchanged.  He has not had orthopnea.  He does have some mild pedal edema that is unchanged.  He has not had syncope.     Past Medical History:  Diagnosis Date   Adenomatous polyp    history   Arthritis    knees , shoulders, hands    Atrial bigeminy    in the past   BPH (benign prostatic hyperplasia)    Carotid artery disease ([place])    a. Doppler, January, 2012, 40-59% bilateral stenoses;  b.  Carotids 4/14:  40-59% bilat ICA, occluded R vertebral artery; f/u 1 year // Carotid US 07/2018:  Bilat ICA 1-39; R subclavian stenosis >> rpt in 1 year  // Carotid US 07/2019: Bilateral ICA 40-59; right subclavian stenosis; repeat 1 year // Carotid US 3/61: LICA 44-31, R subcl stenosis.//Carotid US 07/2021: R 1-39, L 40-59   Colon polyps    Coronary artery disease    acute inferior MI Jan 2002 with stent / Taxus stent LAD 2004 / Cypher stent prox LAD and cypher stent mid circ May 2006 / nuclear stress Dec 2009 normal EF 63% / nuclear August 2011 EF 62% / inferior thining and very mild ischemia inferior and apex /  catherization Sept 2011 EF 60%  mild to moderate diffuse RCA disease, moderate discrete LAD stenosis before the stent in the proximal LAD med tx rec   Diverticulosis    Dyslipidemia    Ejection fraction    Fatty liver    history   GERD (gastroesophageal reflux disease)    Hypertension    [facility] - 5 stents total , last cardiac cath. 2011.  Cleared for  surgery by Dr. [name]- 04/23/2013   IBS (irritable bowel syndrome)    Myocardial infarction [facility]) 2002   Myoview    Myoview 07/2018: EF 63, inferior artifact, no ischemia or scar; Low Risk   Numbness and tingling in hands    resolved   Overweight(278.02)    Prostate cancer ([place]) approx 2016   treated /w  Lupron injection x1, then radium   Shortness of breath dyspnea    Type II diabetes mellitus (HCC)    Current Medications: Current Meds  Medication Sig   acetaminophen (TYLENOL) 500 MG tablet Take 1 tablet (500 mg total) by mouth  every 6 (six) hours as needed for mild pain.   amLODipine (NORVASC) 5 MG tablet Take 1 tablet (5 mg total) by mouth daily.   amoxicillin (AMOXIL) 500 MG tablet Take by mouth. Prior to dental appointment   clopidogrel (PLAVIX) 75 MG tablet Take 75 mg by mouth daily.   gabapentin (NEURONTIN) 300 MG capsule Take 300 mg by mouth 2 (two) times daily.   Guaifenesin 1200 MG TB12 Take 1,200 mg by mouth daily as needed (cough).   JENTADUETO XR 2.05-998 MG TB24 Take 1 tablet by mouth daily.   loperamide (IMODIUM A-D) 2 MG tablet Take 2 mg by mouth 4 (four) times daily as needed for diarrhea or loose stools.   loratadine (CLARITIN) 10 MG tablet Take 10 mg by mouth daily as needed for allergies.   Multiple Vitamin (MULTI-VITAMIN) tablet Take 1 tablet by mouth daily.   nitroGLYCERIN (NITROSTAT) 0.4 MG SL tablet Place 1 tablet (0.4 mg total) under the tongue every 5 (five) minutes as needed for chest pain. Please keep upcoming appt. Thank you   omeprazole (PRILOSEC) 20 MG capsule Take 20 mg by mouth daily.   ramipril (ALTACE) 10 MG capsule Take 10 mg by mouth daily after breakfast.   simvastatin (ZOCOR) 20 MG tablet Take 20 mg by mouth at bedtime.   tamsulosin (FLOMAX) 0.4 MG CAPS capsule Take 0.4 mg by mouth at bedtime.    Allergies:   Atorvastatin, Rosuvastatin, and Clindamycin   Social History   Tobacco Use   Smoking status: Never   Smokeless tobacco: Never  Vaping Use   Vaping Use: Never used  Substance Use Topics   Alcohol use: No   Drug use: No    Family Hx: The patient's family history includes Heart attack in his paternal grandfather; Heart disease in his brother and father; Hypertension in his father.  Review of Systems  Musculoskeletal:  Positive for back pain and joint pain.     EKGs/Labs/Other Test Reviewed:    EKG:  EKG is not ordered today.     Recent Labs: No results found for requested labs within last 365 days.   Recent Lipid Panel No results for input(s): "CHOL",  "TRIG", "HDL", "VLDL", "LDLCALC", "LDLDIRECT" in the last 8760 hours.    Risk Assessment/Calculations/Metrics:         HYPERTENSION CONTROL Vitals:   11/27/21 1455 11/27/21 1602  BP: (!) 143/58 (!) 148/70    The  patient's blood pressure is elevated above target today.  In order to address the patient's elevated BP: Blood pressure will be monitored at home to determine if medication changes need to be made.       Physical Exam:    VS:  BP (!) 148/70   Pulse 72   Ht '5\' 6"'$  (1.676 m)   Wt 206 lb 6.4 oz (93.6 kg)   SpO2 98%   BMI 33.31 kg/m     Wt Readings from Last 3 Encounters:  11/27/21 206 lb 6.4 oz (93.6 kg)  02/26/21 200 lb (90.7 kg)  08/16/20 197 lb 3.2 oz (89.4 kg)    Constitutional:      Appearance: Healthy appearance. Not in distress.  Neck:     Vascular: No JVR. JVD normal.  Pulmonary:     Effort: Pulmonary effort is normal.     Breath sounds: No wheezing. No rales.  Cardiovascular:     Normal rate. Regular rhythm. Normal S1. Normal S2.      Murmurs: There is no murmur.  Edema:    Peripheral edema present.    Pretibial: bilateral trace edema of the pretibial area. Abdominal:     Palpations: Abdomen is soft.  Skin:    General: Skin is warm and dry.  Neurological:     General: No focal deficit present.     Mental Status: Alert and oriented to person, place and time.         ASSESSMENT & PLAN:   Coronary artery disease Status post inferior MI in 2002 and multiple PCI procedures since.  Last heart catheterization in 2011 demonstrated patent stents in the RCA, LCx and LAD.  Last Myoview in 2020 was low risk.  He notes occasional left-sided chest discomfort.  This is overall stable.  We discussed the possibility of proceeding with stress testing.  He would like to defer this for now.  I have advised him to let us know if his symptoms should progress so that we can arrange stress testing.  Continue amlodipine 5 mg daily, Plavix 75 mg daily, nitroglycerin as  needed chest pain, simvastatin 20 mg daily.  Follow-up in 6 months.  Hyperlipidemia Labs from [facility] personally reviewed and interpreted>>09/07/21: Total cholesterol 143, HDL 74, LDL 56, triglycerides 64.  Lipids optimal.  Continue simvastatin 20 mg daily.  Hypertension Blood pressure above target.  He notes better blood pressures at home.  Continue to monitor blood pressure at home.  He knows to contact us if his systolic is consistently 161 or higher.  Continue amlodipine 5 mg daily, Altace 10 mg daily.  GERD (gastroesophageal reflux disease) He takes omeprazole as needed.  I advised him to avoid omeprazole due to interactions with Plavix.  I have recommended he get over-the-counter Pepcid 20 mg daily as needed for indigestion.  Carotid artery disease ([place]) Carotid US in July 2023 with mild plaque on the right and mild to moderate plaque on the left.  He will have follow-up carotid US in July 2024.  AV block, Mobitz 1 Occasional second-degree AV block type I noted on monitor in February 2023.  He has a history of chronic dizziness.  He has not had any syncope or near syncope.          Dispo:  Return in about 6 months (around 05/28/2022) for Routine Follow Up, w/ Dr. Burt Knack.   Medication Adjustments/Labs and Tests Ordered: Current medicines are reviewed at length with the patient today.  Concerns regarding medicines are outlined above.  Tests Ordered: No orders of the defined types were placed in this encounter.  Medication Changes: No orders of the defined types were placed in this encounter.  Signed, Richardson Dopp, PA-C  11/27/2021 4:11 PM    [address], [place], [place]  [address] Phone: [phone]; Fax: [phone]

## 2021-11-27 NOTE — Assessment & Plan Note (Signed)
Carotid US in July 2023 with mild plaque on the right and mild to moderate plaque on the left.  He will have follow-up carotid US in July 2024.

## 2021-11-27 NOTE — Assessment & Plan Note (Signed)
Blood pressure above target.  He notes better blood pressures at home.  Continue to monitor blood pressure at home.  He knows to contact us if his systolic is consistently 909 or higher.  Continue amlodipine 5 mg daily, Altace 10 mg daily.

## 2021-11-27 NOTE — Assessment & Plan Note (Signed)
He takes omeprazole as needed.  I advised him to avoid omeprazole due to interactions with Plavix.  I have recommended he get over-the-counter Pepcid 20 mg daily as needed for indigestion.

## 2021-11-27 NOTE — Assessment & Plan Note (Signed)
Occasional second-degree AV block type I noted on monitor in February 2023.  He has a history of chronic dizziness.  He has not had any syncope or near syncope.

## 2021-11-27 NOTE — Assessment & Plan Note (Signed)
Status post inferior MI in 2002 and multiple PCI procedures since.  Last heart catheterization in 2011 demonstrated patent stents in the RCA, LCx and LAD.  Last Myoview in 2020 was low risk.  He notes occasional left-sided chest discomfort.  This is overall stable.  We discussed the possibility of proceeding with stress testing.  He would like to defer this for now.  I have advised him to let us know if his symptoms should progress so that we can arrange stress testing.  Continue amlodipine 5 mg daily, Plavix 75 mg daily, nitroglycerin as needed chest pain, simvastatin 20 mg daily.  Follow-up in 6 months.

## 2021-11-27 NOTE — Assessment & Plan Note (Signed)
Labs from [facility] personally reviewed and interpreted>>09/07/21: Total cholesterol 143, HDL 74, LDL 56, triglycerides 64.  Lipids optimal.  Continue simvastatin 20 mg daily.

## 2021-11-27 NOTE — Patient Instructions (Signed)
Medication Instructions:  Your physician recommends that you continue on your current medications as directed. Please refer to the Current Medication list given to you today, BUT, YOU SHOULDN'T TAKE PRILOSEC WITH PLAVIX.  GET PEPCID, OVER THE COUNTER, AND USE AS NEEDED  *If you need a refill on your cardiac medications before your next appointment, please call your pharmacy*   Lab Work: None ordered  If you have labs (blood work) drawn today and your tests are completely normal, you will receive your results only by: Erma (if you have MyChart) OR A paper copy in the mail If you have any lab test that is abnormal or we need to change your treatment, we will call you to review the results.   Testing/Procedures: None ordered   Follow-Up: At [facility], you and your health needs are our priority.  As part of our continuing mission to provide you with exceptional heart care, we have created designated Provider Care Teams.  These Care Teams include your primary Cardiologist (physician) and Advanced Practice Providers (APPs -  Physician Assistants and Nurse Practitioners) who all work together to provide you with the care you need, when you need it.  We recommend signing up for the patient portal called "MyChart".  Sign up information is provided on this After Visit Summary.  MyChart is used to connect with patients for Virtual Visits (Telemedicine).  Patients are able to view lab/test results, encounter notes, upcoming appointments, etc.  Non-urgent messages can be sent to your provider as well.   To learn more about what you can do with MyChart, go to [email or web address].    Your next appointment:   6 month(s)  The format for your next appointment:   In Person  Provider:   Sherren Mocha, MD     Other Instructions Your physician has requested that you regularly monitor and record your blood pressure readings at home. Please use the same machine at the same time  of day to check your readings and record them to bring to your follow-up visit.   Please monitor blood pressures and keep a log of your readings. IF MOST ARE RUNNING HIGHER THAN 140 ON TOP, THEN CALL ME    Make sure to check 2 hours after your medications.    AVOID these things for 30 minutes before checking your blood pressure: No Drinking caffeine. No Drinking alcohol. No Eating. No Smoking. No Exercising.   Five minutes before checking your blood pressure: Pee. Sit in a dining chair. Avoid sitting in a soft couch or armchair. Be quiet. Do not talk   Important Information About Sugar

## 2021-12-27 ENCOUNTER — Ambulatory Visit: Payer: Medicare PPO | Admitting: Podiatry

## 2021-12-27 VITALS — BP 139/46 | HR 57

## 2021-12-27 DIAGNOSIS — M79674 Pain in right toe(s): Secondary | ICD-10-CM

## 2021-12-27 DIAGNOSIS — M79675 Pain in left toe(s): Secondary | ICD-10-CM

## 2021-12-27 DIAGNOSIS — B351 Tinea unguium: Secondary | ICD-10-CM

## 2021-12-27 DIAGNOSIS — Z7901 Long term (current) use of anticoagulants: Secondary | ICD-10-CM

## 2021-12-27 NOTE — Progress Notes (Signed)
Subjective:   Patient ID: [name] Ray, male   DOB: 88 y.o.   MRN: [id number]   HPI Chief Complaint  Patient presents with   Diabetes    Diabetic foot care, A1c- 5.6 BG- patient is not taking, Nail trim, right foot numbness,  TX: gabapentin    88 year old male presents the office due to the above concerns.  No swelling redness or injury to the toenail sites.  No open lesions.  He sees Dr. Arnoldo Morale for the nerve issues on the right side.  He gets nerve pain to his right foot but it starts from the hip he reports.  Review of Systems  All other systems reviewed and are negative.  Past Medical History:  Diagnosis Date   Adenomatous polyp    history   Arthritis    knees , shoulders, hands    Atrial bigeminy    in the past   BPH (benign prostatic hyperplasia)    Carotid artery disease ([place])    a. Doppler, January, 2012, 40-59% bilateral stenoses;  b.  Carotids 4/14:  40-59% bilat ICA, occluded R vertebral artery; f/u 1 year // Carotid US 07/2018:  Bilat ICA 1-39; R subclavian stenosis >> rpt in 1 year  // Carotid US 07/2019: Bilateral ICA 40-59; right subclavian stenosis; repeat 1 year // Carotid US 9/35: LICA 70-17, R subcl stenosis.//Carotid US 07/2021: R 1-39, L 40-59   Colon polyps    Coronary artery disease    acute inferior MI Jan 2002 with stent / Taxus stent LAD 2004 / Cypher stent prox LAD and cypher stent mid circ May 2006 / nuclear stress Dec 2009 normal EF 63% / nuclear August 2011 EF 62% / inferior thining and very mild ischemia inferior and apex / catherization Sept 2011 EF 60%  mild to moderate diffuse RCA disease, moderate discrete LAD stenosis before the stent in the proximal LAD med tx rec   Diverticulosis    Dyslipidemia    Ejection fraction    Fatty liver    history   GERD (gastroesophageal reflux disease)    Hypertension    [facility] - 5 stents total , last cardiac cath. 2011.  Cleared for  surgery by Dr. [name]- 04/23/2013   IBS (irritable bowel syndrome)     Myocardial infarction [facility]) 2002   Myoview    Myoview 07/2018: EF 63, inferior artifact, no ischemia or scar; Low Risk   Numbness and tingling in hands    resolved   Overweight(278.02)    Prostate cancer ([place]) approx 2016   treated /w  Lupron injection x1, then radium   Shortness of breath dyspnea    Type II diabetes mellitus ([place])     Past Surgical History:  Procedure Laterality Date   CARDIAC CATHETERIZATION     last cath- 2006   COLONOSCOPY     CORONARY ANGIOPLASTY WITH STENT PLACEMENT     multiple stents in LAD:  "I've got 5 stents" (05/24/2013)   EYE SURGERY Bilateral    cataract surgery with lens implants   LUMBAR LAMINECTOMY/DECOMPRESSION MICRODISCECTOMY N/A 09/11/2017   Procedure: LAMINECTOMY AND FORAMINOTOMY LUMBAR One-Two, Two-Three, Three-Four, Four-Five, Five-Sacral One;  Surgeon: Newman Pies, MD;  Location: [place];  Service: Neurosurgery;  Laterality: N/A;  LAMINECTOMY AND FORAMINOTOMY LUMBAR One-Two,Two-Three, Three-Four, Four-Five, Five-Sacral One   NEUROPLASTY / TRANSPOSITION MEDIAN NERVE AT CARPAL TUNNEL Left    also surgery on thumb, elbow- nerve release    PROSTATE BIOPSY     followed later by  HDR   TOTAL HIP ARTHROPLASTY Left 01/26/2018   Procedure: TOTAL HIP ARTHROPLASTY ANTERIOR APPROACH;  Surgeon: [name], MD;  [facility];  Service: Orthopedics;  Laterality: Left;  128mn   TOTAL KNEE ARTHROPLASTY Right 05/24/2013   TOTAL KNEE ARTHROPLASTY Right 05/24/2013   Procedure: RIGHT TOTAL KNEE ARTHROPLASTY;  Surgeon: SVickey Huger MD;  Location[place]  Service: Orthopedics;  Laterality: Right;   TOTAL KNEE ARTHROPLASTY Left 02/14/2014   Procedure: LEFT TOTAL KNEE ARTHROPLASTY;  Surgeon: SVickey Huger MD;  [place]  Service: Orthopedics;  Laterality: Left;     Current Outpatient Medications:    acetaminophen (TYLENOL) 500 MG tablet, Take 1 tablet (500 mg total) by mouth every 6 (six) hours as needed for mild pain., Disp: , Rfl:    amLODipine  (NORVASC) 5 MG tablet, Take 1 tablet (5 mg total) by mouth daily., Disp: 90 tablet, Rfl: 3   amoxicillin (AMOXIL) 500 MG tablet, Take by mouth. Prior to dental appointment, Disp: , Rfl:    clopidogrel (PLAVIX) 75 MG tablet, Take 75 mg by mouth daily., Disp: , Rfl:    gabapentin (NEURONTIN) 300 MG capsule, Take 300 mg by mouth 2 (two) times daily., Disp: , Rfl:    Guaifenesin 1200 MG TB12, Take 1,200 mg by mouth daily as needed (cough)., Disp: , Rfl:    JENTADUETO XR 2.05-998 MG TB24, Take 1 tablet by mouth daily., Disp: , Rfl:    loperamide (IMODIUM A-D) 2 MG tablet, Take 2 mg by mouth 4 (four) times daily as needed for diarrhea or loose stools., Disp: , Rfl:    loratadine (CLARITIN) 10 MG tablet, Take 10 mg by mouth daily as needed for allergies., Disp: , Rfl:    Multiple Vitamin (MULTI-VITAMIN) tablet, Take 1 tablet by mouth daily., Disp: , Rfl:    nitroGLYCERIN (NITROSTAT) 0.4 MG SL tablet, Place 1 tablet (0.4 mg total) under the tongue every 5 (five) minutes as needed for chest pain. Please keep upcoming appt. Thank you, Disp: 25 tablet, Rfl: 1   omeprazole (PRILOSEC) 20 MG capsule, Take 20 mg by mouth daily., Disp: , Rfl:    ramipril (ALTACE) 10 MG capsule, Take 10 mg by mouth daily after breakfast., Disp: , Rfl:    simvastatin (ZOCOR) 20 MG tablet, Take 20 mg by mouth at bedtime., Disp: , Rfl:    tamsulosin (FLOMAX) 0.4 MG CAPS capsule, Take 0.4 mg by mouth at bedtime., Disp: , Rfl:   Allergies  Allergen Reactions   Atorvastatin Other (See Comments)    SEVERE LEG PAIN  Can not walk  Sever leg pain   Rosuvastatin Other (See Comments)    JOINT PAIN   Makes knees hurt   REACTION: unknown  Makes knees hurt   Clindamycin Rash and Other (See Comments)    PATIENT REFUSES          Objective:  Physical Exam  General: AAO x3, NAD  Dermatological: Nails are hypertrophic, dystrophic, brittle, discolored, elongated 10. No surrounding redness or drainage. Tenderness nails 1-5  bilaterally. No open lesions or pre-ulcerative lesions are identified today.  Vascular: Dorsalis Pedis artery and Posterior Tibial artery pedal pulses are 2/4 bilateral with immedate capillary fill time. There is no pain with calf compression, swelling, warmth, erythema.   Neruologic: Grossly intact via light touch bilateral.  Musculoskeletal: No areas of discomfort otherwise.  Muscular strength 5/5 in all groups tested bilateral.  Gait: Unassisted, Nonantalgic.       Assessment:   Symptomatic onychomycosis  Plan:  -Treatment options discussed including all alternatives, risks, and complications -Etiology of symptoms were discussed -Nails debrided 10 without complications or bleeding. -Daily foot inspection -Follow-up in 3 months or sooner if any problems arise. In the meantime, encouraged to call the office with any questions, concerns, change in symptoms.   Celesta Gentile, DPM

## 2022-02-01 DIAGNOSIS — C61 Malignant neoplasm of prostate: Secondary | ICD-10-CM | POA: Diagnosis not present

## 2022-02-01 DIAGNOSIS — E349 Endocrine disorder, unspecified: Secondary | ICD-10-CM | POA: Diagnosis not present

## 2022-02-14 ENCOUNTER — Ambulatory Visit
Admission: RE | Admit: 2022-02-14 | Discharge: 2022-02-14 | Disposition: A | Discharge status: Home / Self Care | Payer: Medicare PPO | Source: Ambulatory Visit | Attending: Physician Assistant | Admitting: Physician Assistant

## 2022-02-14 VITALS — BP 123/63 | HR 57 | Temp 97.5°F | Resp 16

## 2022-02-14 DIAGNOSIS — Z1152 Encounter for screening for COVID-19: Secondary | ICD-10-CM | POA: Insufficient documentation

## 2022-02-14 DIAGNOSIS — J069 Acute upper respiratory infection, unspecified: Secondary | ICD-10-CM | POA: Diagnosis not present

## 2022-02-14 NOTE — ED Provider Notes (Signed)
[facility]    CSN: [id number] Arrival date & time: 02/14/22  4656      History   Chief Complaint Chief Complaint  Patient presents with   Cough    HPI [name] Ray is a 89 y.o. male.   89 year old male presents for COVID test and cough.  Patient indicates for past 3 days he has had some upper respiratory congestion with rhinitis, postnasal drip which has been clear.  He also indicates he has been having a mild chest congestion with intermittent cough, production has been yellow and thick.  Patient indicates he has not had fever, chills, body aches.  He indicates he has not had wheezing or shortness of breath.  Patient indicates that he did a COVID test on Monday when his symptoms first started it was positive, the patient indicates he did a second COVID test yesterday Wednesday, and it was positive also.  Patient indicates he is tolerating fluids well and he is without nausea or vomiting.  Patient desires to have a COVID test to make sure that he has COVID so that a reduced dose of Paxlovid can be issued.   Cough Associated symptoms: rhinorrhea     Past Medical History:  Diagnosis Date   Adenomatous polyp    history   Arthritis    knees , shoulders, hands    Atrial bigeminy    in the past   BPH (benign prostatic hyperplasia)    Carotid artery disease ([place])    a. Doppler, January, 2012, 40-59% bilateral stenoses;  b.  Carotids 4/14:  40-59% bilat ICA, occluded R vertebral artery; f/u 1 year // Carotid US 07/2018:  Bilat ICA 1-39; R subclavian stenosis >> rpt in 1 year  // Carotid US 07/2019: Bilateral ICA 40-59; right subclavian stenosis; repeat 1 year // Carotid US 8/12: LICA 75-17, R subcl stenosis.//Carotid US 07/2021: R 1-39, L 40-59   Colon polyps    Coronary artery disease    acute inferior MI Jan 2002 with stent / Taxus stent LAD 2004 / Cypher stent prox LAD and cypher stent mid circ May 2006 / nuclear stress Dec 2009 normal EF 63% / nuclear August 2011 EF  62% / inferior thining and very mild ischemia inferior and apex / catherization Sept 2011 EF 60%  mild to moderate diffuse RCA disease, moderate discrete LAD stenosis before the stent in the proximal LAD med tx rec   Diverticulosis    Dyslipidemia    Ejection fraction    Fatty liver    history   GERD (gastroesophageal reflux disease)    Hypertension    [facility] - 5 stents total , last cardiac cath. 2011.  Cleared for  surgery by Dr. [name]- 04/23/2013   IBS (irritable bowel syndrome)    Myocardial infarction ([place]) 2002   Myoview    Myoview 07/2018: EF 63, inferior artifact, no ischemia or scar; Low Risk   Numbness and tingling in hands    resolved   Overweight(278.02)    Prostate cancer ([place]) approx 2016   treated /w  Lupron injection x1, then radium   Shortness of breath dyspnea    Type II diabetes mellitus ([place])     Patient Active Problem List   Diagnosis Date Noted   AV block, [place] 1 11/27/2021   Pancytopenia ([place])    Diarrhea    Tick bite    Chest pain 07/04/2020   SIRS (systemic inflammatory response syndrome) ([place]) 07/04/2020   Renal insufficiency 07/04/2020  BPH (benign prostatic hyperplasia) 07/04/2020   OA (osteoarthritis) of hip 01/26/2018   Spinal stenosis of lumbar region with neurogenic claudication 09/11/2017   Dizziness 05/04/2014   Ejection fraction    S/P total knee arthroplasty 05/24/2013   Carotid artery disease ([facility])    Coronary artery disease    Hyperlipidemia    GERD (gastroesophageal reflux disease)    Hypertension    Numbness and tingling in hands    FECAL OCCULT BLOOD 10/19/2009   COLONIC POLYPS, ADENOMATOUS, HX OF 10/19/2009   OVERWEIGHT 11/08/2008   Type 2 diabetes mellitus with hyperlipidemia ([place]) 10/25/2008    Past Surgical History:  Procedure Laterality Date   CARDIAC CATHETERIZATION     last cath- 2006   COLONOSCOPY     CORONARY ANGIOPLASTY WITH STENT PLACEMENT     multiple stents in LAD:  "I've got 5 stents" (05/24/2013)    EYE SURGERY Bilateral    cataract surgery with lens implants   LUMBAR LAMINECTOMY/DECOMPRESSION MICRODISCECTOMY N/A 09/11/2017   Procedure: LAMINECTOMY AND FORAMINOTOMY LUMBAR One-Two, Two-Three, Three-Four, Four-Five, Five-Sacral One;  Surgeon: Newman Pies, MD;  Location: [place];  Service: Neurosurgery;  Laterality: N/A;  LAMINECTOMY AND FORAMINOTOMY LUMBAR One-Two,Two-Three, Three-Four, Four-Five, Five-Sacral One   NEUROPLASTY / TRANSPOSITION MEDIAN NERVE AT CARPAL TUNNEL Left    also surgery on thumb, elbow- nerve release    PROSTATE BIOPSY     followed later by HDR   TOTAL HIP ARTHROPLASTY Left 01/26/2018   Procedure: TOTAL HIP ARTHROPLASTY ANTERIOR APPROACH;  Surgeon: [name], MD;  [facility];  Service: Orthopedics;  Laterality: Left;  167mn   TOTAL KNEE ARTHROPLASTY Right 05/24/2013   TOTAL KNEE ARTHROPLASTY Right 05/24/2013   Procedure: RIGHT TOTAL KNEE ARTHROPLASTY;  Surgeon: SVickey Huger MD;  [place]  Service: Orthopedics;  Laterality: Right;   TOTAL KNEE ARTHROPLASTY Left 02/14/2014   Procedure: LEFT TOTAL KNEE ARTHROPLASTY;  Surgeon: SVickey Huger MD;  Location[facility]  Service: Orthopedics;  Laterality: Left;       Home Medications    Prior to Admission medications   Medication Sig Start Date End Date Taking? Authorizing Provider  acetaminophen (TYLENOL) 500 MG tablet Take 1 tablet (500 mg total) by mouth every 6 (six) hours as needed for mild pain. 07/23/18   WRichardson DoppT, PA-C  amLODipine (NORVASC) 5 MG tablet Take 1 tablet (5 mg total) by mouth daily. 01/22/19   WRichardson DoppT, PA-C  amoxicillin (AMOXIL) 500 MG tablet Take by mouth. Prior to dental appointment    [provider]  clopidogrel (PLAVIX) 75 MG tablet Take 75 mg by mouth daily.    [provider]  gabapentin (NEURONTIN) 300 MG capsule Take 300 mg by mouth 2 (two) times daily.    [provider]  Guaifenesin 1200 MG TB12 Take 1,200 mg by mouth daily as needed  (cough).    [provider]  JENTADUETO XR 2.05-998 MG TB24 Take 1 tablet by mouth daily. 02/12/18   [provider]  loperamide (IMODIUM A-D) 2 MG tablet Take 2 mg by mouth 4 (four) times daily as needed for diarrhea or loose stools.    [provider]  loratadine (CLARITIN) 10 MG tablet Take 10 mg by mouth daily as needed for allergies.    [provider]  Multiple Vitamin (MULTI-VITAMIN) tablet Take 1 tablet by mouth daily.    [provider]  nitroGLYCERIN (NITROSTAT) 0.4 MG SL tablet Place 1 tablet (0.4 mg total) under the tongue every  5 (five) minutes as needed for chest pain. Please keep upcoming appt. Thank you 04/23/17   Sherren Mocha, MD  omeprazole (PRILOSEC) 20 MG capsule Take 20 mg by mouth daily.    [provider]  ramipril (ALTACE) 10 MG capsule Take 10 mg by mouth daily after breakfast.    [provider]  simvastatin (ZOCOR) 20 MG tablet Take 20 mg by mouth at bedtime.    [provider]  tamsulosin (FLOMAX) 0.4 MG CAPS capsule Take 0.4 mg by mouth at bedtime. 04/01/13   [provider]    Family History Family History  Problem Relation Age of Onset   Hypertension Father    Heart disease Father    Heart disease Brother    Heart attack Paternal Grandfather     Social History Social History   Tobacco Use   Smoking status: Never   Smokeless tobacco: Never  Vaping Use   Vaping Use: Never used  Substance Use Topics   Alcohol use: No   Drug use: No     Allergies   Atorvastatin, Rosuvastatin, and Clindamycin   Review of Systems Review of Systems  HENT:  Positive for postnasal drip and rhinorrhea.   Respiratory:  Positive for cough.      Physical Exam Triage Vital Signs ED Triage Vitals [02/14/22 1021]  Enc Vitals Group     BP 123/63     Pulse Rate (!) 57     Resp 16     Temp (!) 97.5 F (36.4 C)     Temp Source Oral     SpO2 96 %     Weight      Height      Head  Circumference      Peak Flow      Pain Score 0     Pain Loc      Pain Edu?      Excl. in [place]?    No data found.  Updated Vital Signs BP 123/63 (BP Location: Left Arm)   Pulse (!) 57   Temp (!) 97.5 F (36.4 C) (Oral)   Resp 16   SpO2 96%   Visual Acuity Right Eye Distance:   Left Eye Distance:   Bilateral Distance:    Right Eye Near:   Left Eye Near:    Bilateral Near:     Physical Exam Constitutional:      Appearance: Normal appearance.  HENT:     Right Ear: Tympanic membrane and ear canal normal.     Left Ear: Tympanic membrane and ear canal normal.     Mouth/Throat:     Mouth: Mucous membranes are moist.     Pharynx: Oropharynx is clear.  Cardiovascular:     Rate and Rhythm: Normal rate and regular rhythm.     Heart sounds: Normal heart sounds.  Pulmonary:     Effort: Pulmonary effort is normal.     Breath sounds: Normal breath sounds and air entry. No wheezing, rhonchi or rales.  Lymphadenopathy:     Cervical: No cervical adenopathy.  Neurological:     Mental Status: He is alert.      UC Treatments / Results  Labs (all labs ordered are listed, but only abnormal results are displayed) Labs Reviewed  SARS CORONAVIRUS 2 (TAT 6-24 HRS)    EKG   Radiology No results found.  Procedures Procedures (including critical care time)  Medications Ordered in UC Medications - No data to display  Initial Impression / Assessment  and Plan / UC Course  I have reviewed the triage vital signs and the nursing notes.  Pertinent labs & imaging results that were available during my care of the patient were reviewed by me and considered in my medical decision making (see chart for details).    Plan: The diagnosis will be treated with the following: 1.  Screening for COVID-19: A.  Treatment may be considered depending on results of the COVID-19 test.  If the test is positive patient should be issued Paxlovid at reduced dose due to renal functions and age. B.   Advised to continue using cough medication OTC as needed for congestion. C.  Please call patient with results of Paxlovid test. 2.  Upper respiratory tract infection: A.  Advised take OTC cough medicine as needed. 3.  Advised follow-up PCP or return to urgent care as needed. Final Clinical Impressions(s) / UC Diagnoses   Final diagnoses:  Acute upper respiratory infection  Encounter for screening for COVID-19     Discharge Instructions      COVID test will be completed in 48 hours.  If you do not get a call from this office that indicates the test is negative.  Log onto MyChart to review the test results when it post in 48 hours.  Advised to continue to use OTC Mucinex for congestion if needed.  Advised follow-up PCP or return to urgent care as needed.  Call [phone] and ask for Nurse to advise of lab results.    ED Prescriptions   None    PDMP not reviewed this encounter.   Nyoka Lint, PA-C 02/14/22 1041

## 2022-02-14 NOTE — Discharge Instructions (Addendum)
COVID test will be completed in 48 hours.  If you do not get a call from this office that indicates the test is negative.  Log onto MyChart to review the test results when it post in 48 hours.  Advised to continue to use OTC Mucinex for congestion if needed.  Advised follow-up PCP or return to urgent care as needed.  Call [phone] and ask for Nurse to advise of lab results.

## 2022-02-14 NOTE — ED Triage Notes (Signed)
Pt states cough and congestion for the past 3 days states he had 2 positive home Covid test.

## 2022-02-15 ENCOUNTER — Telehealth (HOSPITAL_COMMUNITY): Payer: Self-pay | Admitting: Emergency Medicine

## 2022-02-15 LAB — SARS CORONAVIRUS 2 (TAT 6-24 HRS): SARS Coronavirus 2: POSITIVE — AB

## 2022-02-15 MED ORDER — NIRMATRELVIR/RITONAVIR (PAXLOVID) TABLET (RENAL DOSING): 2.0000 | ORAL_TABLET | Freq: Two times a day (BID) | ORAL | 0 refills | Status: AC

## 2022-03-07 ENCOUNTER — Encounter: Payer: Self-pay | Admitting: Podiatry

## 2022-03-11 DIAGNOSIS — H40013 Open angle with borderline findings, low risk, bilateral: Secondary | ICD-10-CM | POA: Diagnosis not present

## 2022-03-11 DIAGNOSIS — H04123 Dry eye syndrome of bilateral lacrimal glands: Secondary | ICD-10-CM | POA: Diagnosis not present

## 2022-03-11 DIAGNOSIS — H532 Diplopia: Secondary | ICD-10-CM | POA: Diagnosis not present

## 2022-03-11 DIAGNOSIS — E119 Type 2 diabetes mellitus without complications: Secondary | ICD-10-CM | POA: Diagnosis not present

## 2022-03-12 ENCOUNTER — Other Ambulatory Visit: Payer: Self-pay | Admitting: Ophthalmology

## 2022-03-12 DIAGNOSIS — H532 Diplopia: Secondary | ICD-10-CM

## 2022-03-19 DIAGNOSIS — D692 Other nonthrombocytopenic purpura: Secondary | ICD-10-CM | POA: Diagnosis not present

## 2022-03-19 DIAGNOSIS — I10 Essential (primary) hypertension: Secondary | ICD-10-CM | POA: Diagnosis not present

## 2022-03-19 DIAGNOSIS — C61 Malignant neoplasm of prostate: Secondary | ICD-10-CM | POA: Diagnosis not present

## 2022-03-19 DIAGNOSIS — I25118 Atherosclerotic heart disease of native coronary artery with other forms of angina pectoris: Secondary | ICD-10-CM | POA: Diagnosis not present

## 2022-03-19 DIAGNOSIS — E1151 Type 2 diabetes mellitus with diabetic peripheral angiopathy without gangrene: Secondary | ICD-10-CM | POA: Diagnosis not present

## 2022-03-19 DIAGNOSIS — M5416 Radiculopathy, lumbar region: Secondary | ICD-10-CM | POA: Diagnosis not present

## 2022-03-19 DIAGNOSIS — E669 Obesity, unspecified: Secondary | ICD-10-CM | POA: Diagnosis not present

## 2022-03-19 DIAGNOSIS — G5601 Carpal tunnel syndrome, right upper limb: Secondary | ICD-10-CM | POA: Diagnosis not present

## 2022-03-26 DIAGNOSIS — M4807 Spinal stenosis, lumbosacral region: Secondary | ICD-10-CM | POA: Diagnosis not present

## 2022-03-29 ENCOUNTER — Ambulatory Visit: Payer: Medicare PPO | Admitting: Podiatry

## 2022-03-30 ENCOUNTER — Ambulatory Visit
Admission: RE | Admit: 2022-03-30 | Discharge: 2022-03-30 | Disposition: A | Discharge status: Home / Self Care | Payer: Medicare PPO | Source: Ambulatory Visit | Attending: Ophthalmology | Admitting: Ophthalmology

## 2022-03-30 DIAGNOSIS — H532 Diplopia: Secondary | ICD-10-CM

## 2022-03-30 MED ORDER — GADOPICLENOL 0.5 MMOL/ML IV SOLN
9.0000 mL | Freq: Once | INTRAVENOUS | Status: AC | PRN
  Administered 2022-03-30: 9 mL via INTRAVENOUS

## 2022-04-03 ENCOUNTER — Ambulatory Visit: Payer: Medicare PPO | Admitting: Podiatry

## 2022-04-03 ENCOUNTER — Encounter: Payer: Self-pay | Admitting: Podiatry

## 2022-04-03 DIAGNOSIS — E785 Hyperlipidemia, unspecified: Secondary | ICD-10-CM

## 2022-04-03 DIAGNOSIS — E1169 Type 2 diabetes mellitus with other specified complication: Secondary | ICD-10-CM | POA: Diagnosis not present

## 2022-04-03 DIAGNOSIS — B351 Tinea unguium: Secondary | ICD-10-CM

## 2022-04-03 NOTE — Progress Notes (Addendum)
This patient returns to my office for at risk foot care.  This patient requires this care by a professional since this patient will be at risk due to having diabetes.  This patient is unable to cut nails himself since the patient cannot reach his nails.These nails are painful walking and wearing shoes.  He presents to the office with his wife.  This patient presents for at risk foot care today.  General Appearance  Alert, conversant and in no acute stress.  Vascular  Dorsalis pedis and posterior tibial  pulses are weakly  palpable  bilaterally.  Capillary return is within normal limits  bilaterally. Temperature is within normal limits  bilaterally.  Neurologic  Senn-Weinstein monofilament wire test within normal limits  bilaterally. Muscle power within normal limits bilaterally.  Nails Thick disfigured discolored nails with subungual debris  from hallux to fifth toes bilaterally. No evidence of bacterial infection or drainage bilaterally.  Orthopedic  No limitations of motion  feet .  No crepitus or effusions noted.  No bony pathology or digital deformities noted.  Skin  normotropic skin with no porokeratosis noted bilaterally.  No signs of infections or ulcers noted.     Onychomycosis  Pain in right toes  Pain in left toes  Consent was obtained for treatment procedures.   Mechanical debridement of nails 1-5  bilaterally performed with a nail nipper.  Filed with dremel without incident.    Return office visit   3 months                   Told patient to return for periodic foot care and evaluation due to potential at risk complications.   Gardiner Barefoot DPM

## 2022-06-07 ENCOUNTER — Ambulatory Visit: Payer: Medicare PPO | Attending: Cardiovascular Disease | Admitting: Cardiovascular Disease

## 2022-06-07 ENCOUNTER — Encounter: Payer: Self-pay | Admitting: Cardiovascular Disease

## 2022-06-07 VITALS — BP 102/48 | HR 58 | Ht 66.0 in | Wt 196.0 lb

## 2022-06-07 DIAGNOSIS — I25119 Atherosclerotic heart disease of native coronary artery with unspecified angina pectoris: Secondary | ICD-10-CM | POA: Diagnosis not present

## 2022-06-07 DIAGNOSIS — I700 Atherosclerosis of aorta: Secondary | ICD-10-CM

## 2022-06-07 DIAGNOSIS — I10 Essential (primary) hypertension: Secondary | ICD-10-CM

## 2022-06-07 DIAGNOSIS — E782 Mixed hyperlipidemia: Secondary | ICD-10-CM

## 2022-06-07 NOTE — Patient Instructions (Signed)
Medication Instructions:  Your physician has recommended you make the following change in your medication:  1-STOP amlodipine   *If you need a refill on your cardiac medications before your next appointment, please call your pharmacy*  Lab Work: If you have labs (blood work) drawn today and your tests are completely normal, you will receive your results only by: MyChart Message (if you have MyChart) OR A paper copy in the mail If you have any lab test that is abnormal or we need to change your treatment, we will call you to review the results.  Follow-Up: At [facility], you and your health needs are our priority.  As part of our continuing mission to provide you with exceptional heart care, we have created designated Provider Care Teams.  These Care Teams include your primary Cardiologist (physician) and Advanced Practice Providers (APPs -  Physician Assistants and Nurse Practitioners) who all work together to provide you with the care you need, when you need it.  We recommend signing up for the patient portal called "MyChart".  Sign up information is provided on this After Visit Summary.  MyChart is used to connect with patients for Virtual Visits (Telemedicine).  Patients are able to view lab/test results, encounter notes, upcoming appointments, etc.  Non-urgent messages can be sent to your provider as well.   To learn more about what you can do with MyChart, go to [email or web address].    Your next appointment:   6 month(s)  Provider:   Jari Favre, PA-C, Ronie Spies, PA-C, Robin Searing, NP, Jacolyn Reedy, PA-C, Eligha Bridegroom, NP, or Tereso Newcomer, PA-C     Then, Tonny Bollman, MD will plan to see you again in 1 year(s).

## 2022-06-07 NOTE — Progress Notes (Signed)
Cardiology Office Note:    Date:  06/07/2022   ID:  [name] Ray, DOB 1933/05/05, MRN [id number]  PCP:  Gaspar Garbe, MD   [facility] Providers Cardiologist:  Tonny Bollman, MD Cardiology APP:  Kennon Rounds     Referring MD: Gaspar Garbe, MD   Chief Complaint  Patient presents with   Coronary Artery Disease    History of Present Illness:    [name] Ray is a 89 y.o. male presenting for follow-up of coronary artery disease.  The patient is here alone today.  His cardiovascular problem list includes:  Coronary artery disease S/p Inf MI in 2002 tx with PCI S/p Taxus DES to LAD in 2004 S/p Cypher DES to pLAD and Cypher DES to mLCx in 05/2004 LHC in 2011 - RCA, LCx and LAD stents patent (mild to mod non-obs dz in LAD, LCx, RCA) Myoview 07/2018: low risk  Echo 07/04/20: EF 55-60, GR 1 DD, no RWMA, mild LVH, normal RVSF  Diabetes Mellitus Hypertension Hyperlipidemia Chronic kidney disease II-III Carotid artery disease [place] 07/2018: bilat ICA 1-39; R subcl stenosis  [place] 07/26/20: L 40-59; ? Prox bilat VA stenosis; R subclavian stenosis  [place] 07/31/2021: R ICA 1-39, LICA 40-59 (no VA or subclavian stenosis noted) BPH Prostate CA s/p radiation Aortic atherosclerosis  Mobitz I  Monitor 02/2021: NSR, occ Mobitz I, no high grade HB, no sustained arrhythmias, AFib/Flutter  The patient is here alone today.  He reports no chest pain or shortness of breath at present.  He is limited by low back problems.  He has had increasing issues with dizziness and gait instability.  He would like to reduce medications if possible.  No edema, orthopnea, PND, or heart palpitations.  Past Medical History:  Diagnosis Date   Adenomatous polyp    history   Arthritis    knees , shoulders, hands    Atrial bigeminy    in the past   BPH (benign prostatic hyperplasia)    Carotid artery disease (HCC)    a. Doppler, January, 2012, 40-59% bilateral stenoses;  b.  Carotids 4/14:   40-59% bilat ICA, occluded R vertebral artery; f/u 1 year // Carotid US 07/2018:  Bilat ICA 1-39; R subclavian stenosis >> rpt in 1 year  // Carotid US 07/2019: Bilateral ICA 40-59; right subclavian stenosis; repeat 1 year // Carotid US 7/22: LICA 40-59, R subcl stenosis.//Carotid US 07/2021: R 1-39, L 40-59   Colon polyps    Coronary artery disease    acute inferior MI Jan 2002 with stent / Taxus stent LAD 2004 / Cypher stent prox LAD and cypher stent mid circ May 2006 / nuclear stress Dec 2009 normal EF 63% / nuclear August 2011 EF 62% / inferior thining and very mild ischemia inferior and apex / catherization Sept 2011 EF 60%  mild to moderate diffuse RCA disease, moderate discrete LAD stenosis before the stent in the proximal LAD med tx rec   Diverticulosis    Dyslipidemia    Ejection fraction    Fatty liver    history   GERD (gastroesophageal reflux disease)    Hypertension    [facility] - 5 stents total , last cardiac cath. 2011.  Cleared for  surgery by Dr. Myrtis Ser- 04/23/2013   IBS (irritable bowel syndrome)    Myocardial infarction (HCC) 2002   Myoview    Myoview 07/2018: EF 63, inferior artifact, no ischemia or scar; Low Risk   Numbness and  tingling in hands    resolved   Overweight(278.02)    Prostate cancer (HCC) approx 2016   treated /w  Lupron injection x1, then radium   Shortness of breath dyspnea    Type II diabetes mellitus (HCC)     Past Surgical History:  Procedure Laterality Date   CARDIAC CATHETERIZATION     last cath- 2006   COLONOSCOPY     CORONARY ANGIOPLASTY WITH STENT PLACEMENT     multiple stents in LAD:  "I've got 5 stents" (05/24/2013)   EYE SURGERY Bilateral    cataract surgery with lens implants   LUMBAR LAMINECTOMY/DECOMPRESSION MICRODISCECTOMY N/A 09/11/2017   Procedure: LAMINECTOMY AND FORAMINOTOMY LUMBAR One-Two, Two-Three, Three-Four, Four-Five, Five-Sacral One;  Surgeon: Tressie Stalker, MD;  Location: [facility] OR;  Service: Neurosurgery;  Laterality:  N/A;  LAMINECTOMY AND FORAMINOTOMY LUMBAR One-Two,Two-Three, Three-Four, Four-Five, Five-Sacral One   NEUROPLASTY / TRANSPOSITION MEDIAN NERVE AT CARPAL TUNNEL Left    also surgery on thumb, elbow- nerve release    PROSTATE BIOPSY     followed later by HDR   TOTAL HIP ARTHROPLASTY Left 01/26/2018   Procedure: TOTAL HIP ARTHROPLASTY ANTERIOR APPROACH;  Surgeon: [name], MD;  Location: [facility] ORS;  Service: Orthopedics;  Laterality: Left;    TOTAL KNEE ARTHROPLASTY Right 05/24/2013   TOTAL KNEE ARTHROPLASTY Right 05/24/2013   Procedure: RIGHT TOTAL KNEE ARTHROPLASTY;  Surgeon: Dannielle Huh, MD;  Location: [facility] OR;  Service: Orthopedics;  Laterality: Right;   TOTAL KNEE ARTHROPLASTY Left 02/14/2014   Procedure: LEFT TOTAL KNEE ARTHROPLASTY;  Surgeon: Dannielle Huh, MD;  Location: [facility] OR;  Service: Orthopedics;  Laterality: Left;    Current Medications: Current Meds  Medication Sig   acetaminophen (TYLENOL) 500 MG tablet Take 1 tablet (500 mg total) by mouth every 6 (six) hours as needed for mild pain.   amoxicillin (AMOXIL) 500 MG tablet Take by mouth. Prior to dental appointment   clopidogrel (PLAVIX) 75 MG tablet Take 75 mg by mouth daily.   gabapentin (NEURONTIN) 300 MG capsule Take 300 mg by mouth 2 (two) times daily.   Guaifenesin 1200 MG TB12 Take 1,200 mg by mouth daily as needed (cough).   JENTADUETO XR 2.05-998 MG TB24 Take 1 tablet by mouth daily.   loperamide (IMODIUM A-D) 2 MG tablet Take 2 mg by mouth 4 (four) times daily as needed for diarrhea or loose stools.   loratadine (CLARITIN) 10 MG tablet Take 10 mg by mouth daily as needed for allergies.   Multiple Vitamin (MULTI-VITAMIN) tablet Take 1 tablet by mouth daily.   nitroGLYCERIN (NITROSTAT) 0.4 MG SL tablet Place 1 tablet (0.4 mg total) under the tongue every 5 (five) minutes as needed for chest pain. Please keep upcoming appt. Thank you   ramipril (ALTACE) 10 MG capsule Take 10 mg by mouth daily after breakfast.    simvastatin (ZOCOR) 20 MG tablet Take 20 mg by mouth at bedtime.   tamsulosin (FLOMAX) 0.4 MG CAPS capsule Take 0.4 mg by mouth at bedtime.   [DISCONTINUED] amLODipine (NORVASC) 2.5 MG tablet Take 2.5 mg by mouth daily.   [DISCONTINUED] amLODipine (NORVASC) 5 MG tablet Take 1 tablet (5 mg total) by mouth daily.     Allergies:   Atorvastatin, Rosuvastatin, and Clindamycin   Social History   Socioeconomic History   Marital status: Married    Spouse name: Not on file   Number of children: Not on file   Years of education: Not on file   Highest education level: Not  on file  Occupational History   Not on file  Tobacco Use   Smoking status: Never   Smokeless tobacco: Never  Vaping Use   Vaping Use: Never used  Substance and Sexual Activity   Alcohol use: No   Drug use: No   Sexual activity: Not Currently  Other Topics Concern   Not on file  Social History Narrative   Not on file   Social Determinants of Health   Financial Resource Strain: Not on file  Food Insecurity: Not on file  Transportation Needs: Not on file  Physical Activity: Not on file  Stress: Not on file  Social Connections: Not on file     Family History: The patient's family history includes Heart attack in his paternal grandfather; Heart disease in his brother and father; Hypertension in his father.  ROS:   Please see the history of present illness.    All other systems reviewed and are negative.  EKGs/Labs/Other Studies Reviewed:    The following studies were reviewed today: Cardiac Studies & Procedures     STRESS TESTS  MYOCARDIAL PERFUSION IMAGING 07/22/2018  Narrative  Nuclear stress EF: 63%. The left ventricular ejection fraction is normal (55-65%).  There was no ST segment deviation noted during stress.  Defect 1: There is a small defect of mild severity present in the basal inferior and mid inferior location. This is consistent with artifart.  This is a low risk study. No evidence of  ischemia or infarction  The study is normal.   ECHOCARDIOGRAM  ECHOCARDIOGRAM COMPLETE 07/04/2020  Narrative ECHOCARDIOGRAM REPORT    Patient Name:   [name] Ray Date of Exam: 07/04/2020 Medical Rec #:  [id number]       Height:       66.0 in Accession #:    [id number]      Weight:       197.0 lb Date of Birth:  1933-03-07        BSA:          1.987 m Patient Age:    86 years        BP:           151/62 mmHg Patient Gender: M               HR:           69 bpm. Exam Location:  Inpatient  Procedure: 2D Echo, Color Doppler and Cardiac Doppler  Indications:    R07.9* Chest pain, unspecified  History:        Patient has prior history of Echocardiogram examinations, most recent 05/06/2014.  Sonographer:    Roosvelt Maser RDCS Referring Phys: [id number] RONDELL A SMITH  IMPRESSIONS   1. Left ventricular ejection fraction, by estimation, is 55 to 60%. The left ventricle has normal function. The left ventricle has no regional wall motion abnormalities. There is mild left ventricular hypertrophy. Left ventricular diastolic parameters are consistent with Grade I diastolic dysfunction (impaired relaxation). 2. Right ventricular systolic function is normal. The right ventricular size is normal. 3. The mitral valve is normal in structure. No evidence of mitral valve regurgitation. No evidence of mitral stenosis. 4. The aortic valve is tricuspid. Aortic valve regurgitation is not visualized. No aortic stenosis is present.  FINDINGS Left Ventricle: Left ventricular ejection fraction, by estimation, is 55 to 60%. The left ventricle has normal function. The left ventricle has no regional wall motion abnormalities. The left ventricular internal cavity size was normal in size. There is  mild left ventricular hypertrophy. Left ventricular diastolic parameters are consistent with Grade I diastolic dysfunction (impaired relaxation).  Right Ventricle: The right ventricular size is normal. Right  ventricular systolic function is normal.  Left Atrium: Left atrial size was normal in size.  Right Atrium: Right atrial size was normal in size.  Pericardium: There is no evidence of pericardial effusion.  Mitral Valve: The mitral valve is normal in structure. No evidence of mitral valve regurgitation. No evidence of mitral valve stenosis.  Tricuspid Valve: The tricuspid valve is normal in structure. Tricuspid valve regurgitation is trivial. No evidence of tricuspid stenosis.  Aortic Valve: The aortic valve is tricuspid. Aortic valve regurgitation is not visualized. No aortic stenosis is present. Aortic valve mean gradient measures 7.0 mmHg. Aortic valve peak gradient measures 10.8 mmHg. Aortic valve area, by VTI measures 1.95 cm.  Pulmonic Valve: The pulmonic valve was normal in structure. Pulmonic valve regurgitation is not visualized. No evidence of pulmonic stenosis.  Aorta: The aortic root is normal in size and structure.  Venous: The inferior vena cava was not well visualized.  IAS/Shunts: The interatrial septum was not well visualized.   LEFT VENTRICLE PLAX 2D LVIDd:         4.10 cm  Diastology LVIDs:         2.80 cm  LV e' medial:    7.07 cm/s LV PW:         1.20 cm  LV E/e' medial:  9.5 LV IVS:        1.10 cm  LV e' lateral:   6.64 cm/s LVOT diam:     2.00 cm  LV E/e' lateral: 10.2 LV SV:         73 LV SV Index:   37 LVOT Area:     3.14 cm   RIGHT VENTRICLE RV Basal diam:  4.20 cm RV Mid diam:    3.80 cm  LEFT ATRIUM             Index       RIGHT ATRIUM           Index LA diam:        4.30 cm 2.16 cm/m  RA Area:     19.00 cm LA Vol (A2C):   61.7 ml 31.05 ml/m RA Volume:   45.50 ml  22.90 ml/m LA Vol (A4C):   51.2 ml 25.77 ml/m LA Biplane Vol: 55.7 ml 28.03 ml/m AORTIC VALVE AV Area (Vmax):    1.91 cm AV Area (Vmean):   1.81 cm AV Area (VTI):     1.95 cm AV Vmax:           164.00 cm/s AV Vmean:          125.000 cm/s AV VTI:            0.374 m AV  Peak Grad:      10.8 mmHg AV Mean Grad:      7.0 mmHg LVOT Vmax:         99.80 cm/s LVOT Vmean:        72.100 cm/s LVOT VTI:          0.232 m LVOT/AV VTI ratio: 0.62  AORTA Ao Root diam: 3.50 cm Ao Asc diam:  3.00 cm  MITRAL VALVE MV Area (PHT): 2.66 cm     SHUNTS MV Decel Time: 285 msec     Systemic VTI:  0.23 m MV E velocity: 67.40 cm/s   Systemic Diam: 2.00 cm MV  A velocity: 123.00 cm/s MV E/A ratio:  0.55  Olga Millers MD Electronically signed by Olga Millers MD Signature Date/Time: 07/04/2020/2:58:23 PM    Final    MONITORS  LONG TERM MONITOR (3-14 DAYS) 02/28/2021  Narrative Patch Wear Time:  14 days and 0 hours (2023-01-24T12:00:12-498 to 2023-02-07T12:00:16-0500)  Patient had a min HR of 28 bpm, max HR of 158 bpm, and avg HR of 66 bpm. Predominant underlying rhythm was Sinus Rhythm. 1 run of Supraventricular Tachycardia occurred lasting 6 beats with a max rate of 158 bpm (avg 149 bpm). Second Degree AV Block-Mobitz I (Wenckebach) was present. Isolated SVEs were rare (<1.0%), SVE Couplets were rare (<1.0%), and SVE Triplets were rare (<1.0%). Isolated VEs were rare (<1.0%, 678), VE Couplets were rare (<1.0%, 30), and VE Triplets were rare (<1.0%, 2).  SUMMARY: The basic rhythm is normal sinus with an average heart rate of 66 bpm.  Occasional Mobitz 1 AV block noted.  No high-grade heart block.  No sustained arrhythmias.  No atrial fibrillation or flutter.          EKG:  EKG is ordered today.  The ekg ordered today demonstrates NSR 62 bpm, sinus arrhythmia, low voltage QRS  Recent Labs: No results found for requested labs within last 365 days.  Recent Lipid Panel    Component Value Date/Time   CHOL 122 07/04/2020 1107   TRIG 97 07/04/2020 1107   HDL 52 07/04/2020 1107   CHOLHDL 2.3 07/04/2020 1107   VLDL 19 07/04/2020 1107   LDLCALC 51 07/04/2020 1107     Risk Assessment/Calculations:            Physical Exam:    VS:  BP (!) 102/48   Pulse (!)  58   Ht 5\' 6"  (1.676 m)   Wt 196 lb (88.9 kg)   SpO2 98%   BMI 31.64 kg/m     Wt Readings from Last 3 Encounters:  06/07/22 196 lb (88.9 kg)  11/27/21 206 lb 6.4 oz (93.6 kg)  02/26/21 200 lb (90.7 kg)     GEN:  Well nourished, well developed elderly male, ambulates with a cane, in no acute distress HEENT: Normal NECK: No JVD; No carotid bruits LYMPHATICS: No lymphadenopathy CARDIAC: RRR, no murmurs, rubs, gallops RESPIRATORY:  Clear to auscultation without rales, wheezing or rhonchi  ABDOMEN: Soft, non-tender, non-distended MUSCULOSKELETAL:  No edema; No deformity  SKIN: Warm and dry NEUROLOGIC:  Alert and oriented x 3 PSYCHIATRIC:  Normal affect   ASSESSMENT:    1. Coronary artery disease involving native coronary artery of native heart with angina pectoris (HCC)   2. Essential hypertension   3. Mixed hyperlipidemia   4. Aortic atherosclerosis (HCC)    PLAN:    In order of problems listed above:  The patient is stable with minimal angina on his current regimen.  Will keep him on clopidogrel, ramipril, and simvastatin. Patient reports increasing dizziness.  His blood pressure is relatively low for his advanced age.  I am going to have him stop amlodipine.  May need to ultimately reduce ramipril down the road as well. Treated with simvastatin 20 mg daily.  Lipids followed by PCP. Continue risk reduction measures with clopidogrel and statin drug.           Medication Adjustments/Labs and Tests Ordered: Current medicines are reviewed at length with the patient today.  Concerns regarding medicines are outlined above.  Orders Placed This Encounter  Procedures   EKG 12-Lead   No orders  of the defined types were placed in this encounter.   Patient Instructions  Medication Instructions:  Your physician has recommended you make the following change in your medication:  1-STOP amlodipine   *If you need a refill on your cardiac medications before your next  appointment, please call your pharmacy*  Lab Work: If you have labs (blood work) drawn today and your tests are completely normal, you will receive your results only by: MyChart Message (if you have MyChart) OR A paper copy in the mail If you have any lab test that is abnormal or we need to change your treatment, we will call you to review the results.  Follow-Up: At [facility], you and your health needs are our priority.  As part of our continuing mission to provide you with exceptional heart care, we have created designated Provider Care Teams.  These Care Teams include your primary Cardiologist (physician) and Advanced Practice Providers (APPs -  Physician Assistants and Nurse Practitioners) who all work together to provide you with the care you need, when you need it.  We recommend signing up for the patient portal called "MyChart".  Sign up information is provided on this After Visit Summary.  MyChart is used to connect with patients for Virtual Visits (Telemedicine).  Patients are able to view lab/test results, encounter notes, upcoming appointments, etc.  Non-urgent messages can be sent to your provider as well.   To learn more about what you can do with MyChart, go to [email or web address].    Your next appointment:   6 month(s)  Provider:   Jari Favre, PA-C, Ronie Spies, PA-C, Robin Searing, NP, Jacolyn Reedy, PA-C, Eligha Bridegroom, NP, or Tereso Newcomer, PA-C     Then, Tonny Bollman, MD will plan to see you again in 1 year(s).       Signed, Tonny Bollman, MD  06/07/2022 5:41 PM    [facility]

## 2022-06-11 ENCOUNTER — Telehealth: Payer: Self-pay | Admitting: Cardiovascular Disease

## 2022-06-11 NOTE — Telephone Encounter (Signed)
Patient is requesting call back to ask more questions on past tests.

## 2022-06-13 NOTE — Telephone Encounter (Signed)
Returned call to patient who states he received a myChart message stating he had new test results available. Informed that he didn't have anything new from [place]. He wanted to ensure that his last EKG was normal, informed that it was based on MD's comments from visit. No further questions.

## 2022-06-28 DIAGNOSIS — H532 Diplopia: Secondary | ICD-10-CM | POA: Diagnosis not present

## 2022-07-02 ENCOUNTER — Encounter: Payer: Self-pay | Admitting: Podiatry

## 2022-07-04 ENCOUNTER — Ambulatory Visit: Payer: Medicare PPO | Admitting: Podiatry

## 2022-07-05 ENCOUNTER — Encounter: Payer: Self-pay | Admitting: Podiatry

## 2022-07-05 ENCOUNTER — Ambulatory Visit: Payer: Medicare PPO | Admitting: Podiatry

## 2022-07-05 VITALS — BP 102/46

## 2022-07-05 DIAGNOSIS — E785 Hyperlipidemia, unspecified: Secondary | ICD-10-CM

## 2022-07-05 DIAGNOSIS — B351 Tinea unguium: Secondary | ICD-10-CM | POA: Diagnosis not present

## 2022-07-05 DIAGNOSIS — E1169 Type 2 diabetes mellitus with other specified complication: Secondary | ICD-10-CM

## 2022-07-05 NOTE — Progress Notes (Signed)
This patient returns to my office for at risk foot care.  This patient requires this care by a professional since this patient will be at risk due to having diabetes.  This patient is unable to cut nails himself since the patient cannot reach his nails.These nails are painful walking and wearing shoes.  He presents to the office with his wife.  This patient presents for at risk foot care today.  General Appearance  Alert, conversant and in no acute stress.  Vascular  Dorsalis pedis and posterior tibial  pulses are weakly  palpable  bilaterally.  Capillary return is within normal limits  bilaterally. Temperature is within normal limits  bilaterally.  Neurologic  Senn-Weinstein monofilament wire test within normal limits  bilaterally. Muscle power within normal limits bilaterally.  Nails Thick disfigured discolored nails with subungual debris  from hallux to fifth toes bilaterally. No evidence of bacterial infection or drainage bilaterally.  Orthopedic  No limitations of motion  feet .  No crepitus or effusions noted.  No bony pathology or digital deformities noted.  Skin  normotropic skin with no porokeratosis noted bilaterally.  No signs of infections or ulcers noted.     Onychomycosis  Pain in right toes  Pain in left toes  Consent was obtained for treatment procedures.   Mechanical debridement of nails 1-5  bilaterally performed with a nail nipper.  Filed with dremel without incident. Cauterized hallux  B/L.   Return office visit   3 months                   Told patient to return for periodic foot care and evaluation due to potential at risk complications.   Helane Gunther DPM

## 2022-08-22 ENCOUNTER — Ambulatory Visit (HOSPITAL_COMMUNITY)
Admission: RE | Admit: 2022-08-22 | Discharge: 2022-08-22 | Disposition: A | Discharge status: Home / Self Care | Payer: Medicare PPO | Source: Ambulatory Visit | Attending: Physician Assistant | Admitting: Physician Assistant

## 2022-08-22 DIAGNOSIS — I779 Disorder of arteries and arterioles, unspecified: Secondary | ICD-10-CM | POA: Diagnosis not present

## 2022-08-22 DIAGNOSIS — I6522 Occlusion and stenosis of left carotid artery: Secondary | ICD-10-CM

## 2022-08-23 ENCOUNTER — Other Ambulatory Visit (HOSPITAL_COMMUNITY): Payer: Self-pay | Admitting: Physician Assistant

## 2022-08-23 DIAGNOSIS — I6523 Occlusion and stenosis of bilateral carotid arteries: Secondary | ICD-10-CM

## 2022-09-17 DIAGNOSIS — E1151 Type 2 diabetes mellitus with diabetic peripheral angiopathy without gangrene: Secondary | ICD-10-CM | POA: Diagnosis not present

## 2022-09-17 DIAGNOSIS — C61 Malignant neoplasm of prostate: Secondary | ICD-10-CM | POA: Diagnosis not present

## 2022-09-17 DIAGNOSIS — R7989 Other specified abnormal findings of blood chemistry: Secondary | ICD-10-CM | POA: Diagnosis not present

## 2022-09-17 DIAGNOSIS — E538 Deficiency of other specified B group vitamins: Secondary | ICD-10-CM | POA: Diagnosis not present

## 2022-09-17 DIAGNOSIS — E7800 Pure hypercholesterolemia, unspecified: Secondary | ICD-10-CM | POA: Diagnosis not present

## 2022-09-17 DIAGNOSIS — E785 Hyperlipidemia, unspecified: Secondary | ICD-10-CM | POA: Diagnosis not present

## 2022-09-23 DIAGNOSIS — E1151 Type 2 diabetes mellitus with diabetic peripheral angiopathy without gangrene: Secondary | ICD-10-CM | POA: Diagnosis not present

## 2022-09-23 DIAGNOSIS — E669 Obesity, unspecified: Secondary | ICD-10-CM | POA: Diagnosis not present

## 2022-09-23 DIAGNOSIS — R82998 Other abnormal findings in urine: Secondary | ICD-10-CM | POA: Diagnosis not present

## 2022-09-23 DIAGNOSIS — I10 Essential (primary) hypertension: Secondary | ICD-10-CM | POA: Diagnosis not present

## 2022-09-23 DIAGNOSIS — Z0000 Encounter for general adult medical examination without abnormal findings: Secondary | ICD-10-CM | POA: Diagnosis not present

## 2022-09-23 DIAGNOSIS — Z1339 Encounter for screening examination for other mental health and behavioral disorders: Secondary | ICD-10-CM | POA: Diagnosis not present

## 2022-09-23 DIAGNOSIS — Z6831 Body mass index (BMI) 31.0-31.9, adult: Secondary | ICD-10-CM | POA: Diagnosis not present

## 2022-09-23 DIAGNOSIS — D692 Other nonthrombocytopenic purpura: Secondary | ICD-10-CM | POA: Diagnosis not present

## 2022-09-23 DIAGNOSIS — I25118 Atherosclerotic heart disease of native coronary artery with other forms of angina pectoris: Secondary | ICD-10-CM | POA: Diagnosis not present

## 2022-09-23 DIAGNOSIS — C61 Malignant neoplasm of prostate: Secondary | ICD-10-CM | POA: Diagnosis not present

## 2022-09-23 DIAGNOSIS — M5416 Radiculopathy, lumbar region: Secondary | ICD-10-CM | POA: Diagnosis not present

## 2022-09-23 DIAGNOSIS — Z1331 Encounter for screening for depression: Secondary | ICD-10-CM | POA: Diagnosis not present

## 2022-09-24 DIAGNOSIS — M4316 Spondylolisthesis, lumbar region: Secondary | ICD-10-CM | POA: Diagnosis not present

## 2022-10-07 ENCOUNTER — Ambulatory Visit (INDEPENDENT_AMBULATORY_CARE_PROVIDER_SITE_OTHER): Payer: Medicare PPO | Admitting: Podiatry

## 2022-10-07 ENCOUNTER — Encounter: Payer: Self-pay | Admitting: Podiatry

## 2022-10-07 DIAGNOSIS — M79675 Pain in left toe(s): Secondary | ICD-10-CM | POA: Diagnosis not present

## 2022-10-07 DIAGNOSIS — E1169 Type 2 diabetes mellitus with other specified complication: Secondary | ICD-10-CM | POA: Diagnosis not present

## 2022-10-07 DIAGNOSIS — E785 Hyperlipidemia, unspecified: Secondary | ICD-10-CM

## 2022-10-07 DIAGNOSIS — B351 Tinea unguium: Secondary | ICD-10-CM | POA: Diagnosis not present

## 2022-10-07 DIAGNOSIS — M79674 Pain in right toe(s): Secondary | ICD-10-CM | POA: Diagnosis not present

## 2022-10-07 NOTE — Progress Notes (Signed)
This patient returns to my office for at risk foot care.  This patient requires this care by a professional since this patient will be at risk due to having diabetes.  This patient is unable to cut nails himself since the patient cannot reach his nails.These nails are painful walking and wearing shoes.  He presents to the office with his wife.  This patient presents for at risk foot care today.  General Appearance  Alert, conversant and in no acute stress.  Vascular  Dorsalis pedis and posterior tibial  pulses are weakly  palpable  bilaterally.  Capillary return is within normal limits  bilaterally. Temperature is within normal limits  bilaterally.  Neurologic  Senn-Weinstein monofilament wire test within normal limits  bilaterally. Muscle power within normal limits bilaterally.  Nails Thick disfigured discolored nails with subungual debris  from hallux to fifth toes bilaterally. No evidence of bacterial infection or drainage bilaterally.  Orthopedic  No limitations of motion  feet .  No crepitus or effusions noted.  No bony pathology or digital deformities noted.  Skin  normotropic skin with no porokeratosis noted bilaterally.  No signs of infections or ulcers noted.     Onychomycosis  Pain in right toes  Pain in left toes  Consent was obtained for treatment procedures.   Mechanical debridement of nails 1-5  bilaterally performed with a nail nipper.  Filed with dremel without incident. Cauterized hallux  B/L.   Return office visit   3 months                   Told patient to return for periodic foot care and evaluation due to potential at risk complications.   Helane Gunther DPM

## 2023-01-20 ENCOUNTER — Ambulatory Visit (INDEPENDENT_AMBULATORY_CARE_PROVIDER_SITE_OTHER): Payer: Medicare PPO | Admitting: Podiatry

## 2023-01-20 ENCOUNTER — Ambulatory Visit: Payer: Medicare PPO | Admitting: Podiatry

## 2023-01-20 ENCOUNTER — Encounter: Payer: Self-pay | Admitting: Podiatry

## 2023-01-20 VITALS — Ht 66.0 in | Wt 196.0 lb

## 2023-01-20 DIAGNOSIS — E1169 Type 2 diabetes mellitus with other specified complication: Secondary | ICD-10-CM

## 2023-01-20 DIAGNOSIS — E785 Hyperlipidemia, unspecified: Secondary | ICD-10-CM | POA: Diagnosis not present

## 2023-01-20 DIAGNOSIS — M79675 Pain in left toe(s): Secondary | ICD-10-CM | POA: Diagnosis not present

## 2023-01-20 DIAGNOSIS — M79674 Pain in right toe(s): Secondary | ICD-10-CM

## 2023-01-20 DIAGNOSIS — B351 Tinea unguium: Secondary | ICD-10-CM | POA: Diagnosis not present

## 2023-01-20 NOTE — Progress Notes (Signed)
This patient returns to my office for at risk foot care.  This patient requires this care by a professional since this patient will be at risk due to having diabetes.  This patient is unable to cut nails himself since the patient cannot reach his nails.These nails are painful walking and wearing shoes.  He presents to the office with his wife.  This patient presents for at risk foot care today.  General Appearance  Alert, conversant and in no acute stress.  Vascular  Dorsalis pedis and posterior tibial  pulses are weakly  palpable  bilaterally.  Capillary return is within normal limits  bilaterally. Temperature is within normal limits  bilaterally.  Neurologic  Senn-Weinstein monofilament wire test within normal limits  bilaterally. Muscle power within normal limits bilaterally.  Nails Thick disfigured discolored nails with subungual debris  from hallux to fifth toes bilaterally. No evidence of bacterial infection or drainage bilaterally.  Orthopedic  No limitations of motion  feet .  No crepitus or effusions noted.  No bony pathology or digital deformities noted.  Skin  normotropic skin with no porokeratosis noted bilaterally.  No signs of infections or ulcers noted.     Onychomycosis  Pain in right toes  Pain in left toes  Consent was obtained for treatment procedures.   Mechanical debridement of nails 1-5  bilaterally performed with a nail nipper.  Filed with dremel without incident. Cauterized hallux  B/L.   Return office visit   3 months                   Told patient to return for periodic foot care and evaluation due to potential at risk complications.   Helane Gunther DPM

## 2023-01-31 DIAGNOSIS — C61 Malignant neoplasm of prostate: Secondary | ICD-10-CM | POA: Diagnosis not present

## 2023-01-31 DIAGNOSIS — E349 Endocrine disorder, unspecified: Secondary | ICD-10-CM | POA: Diagnosis not present

## 2023-02-15 IMAGING — DX DG CHEST 2V
2 series · 2 of 2 positions shown · non-contrast
Comparison: 05/14/2013

CLINICAL DATA: Chest pain and shortness of burr

EXAM:
CHEST - 2 VIEW

[chest pa]
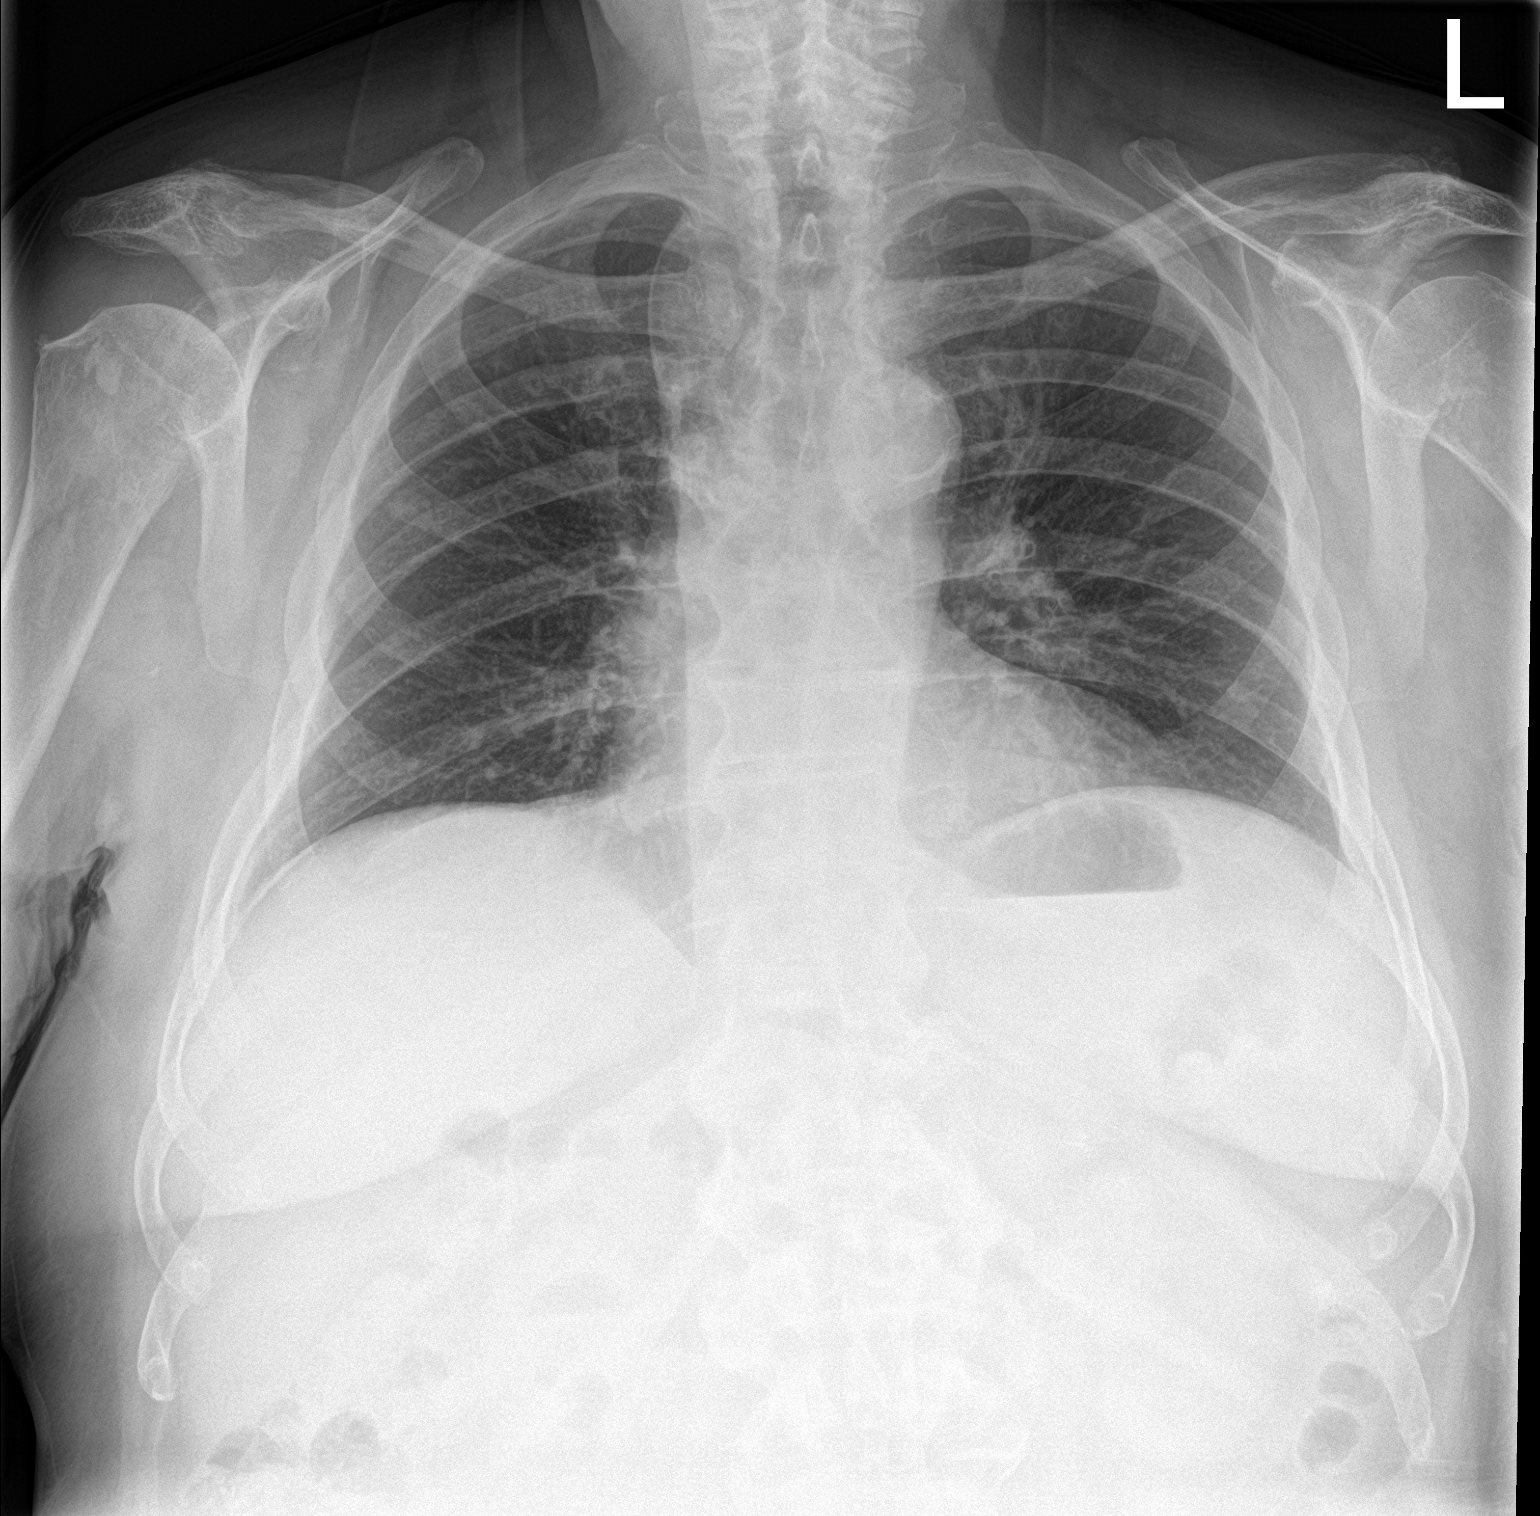

[chest lat]
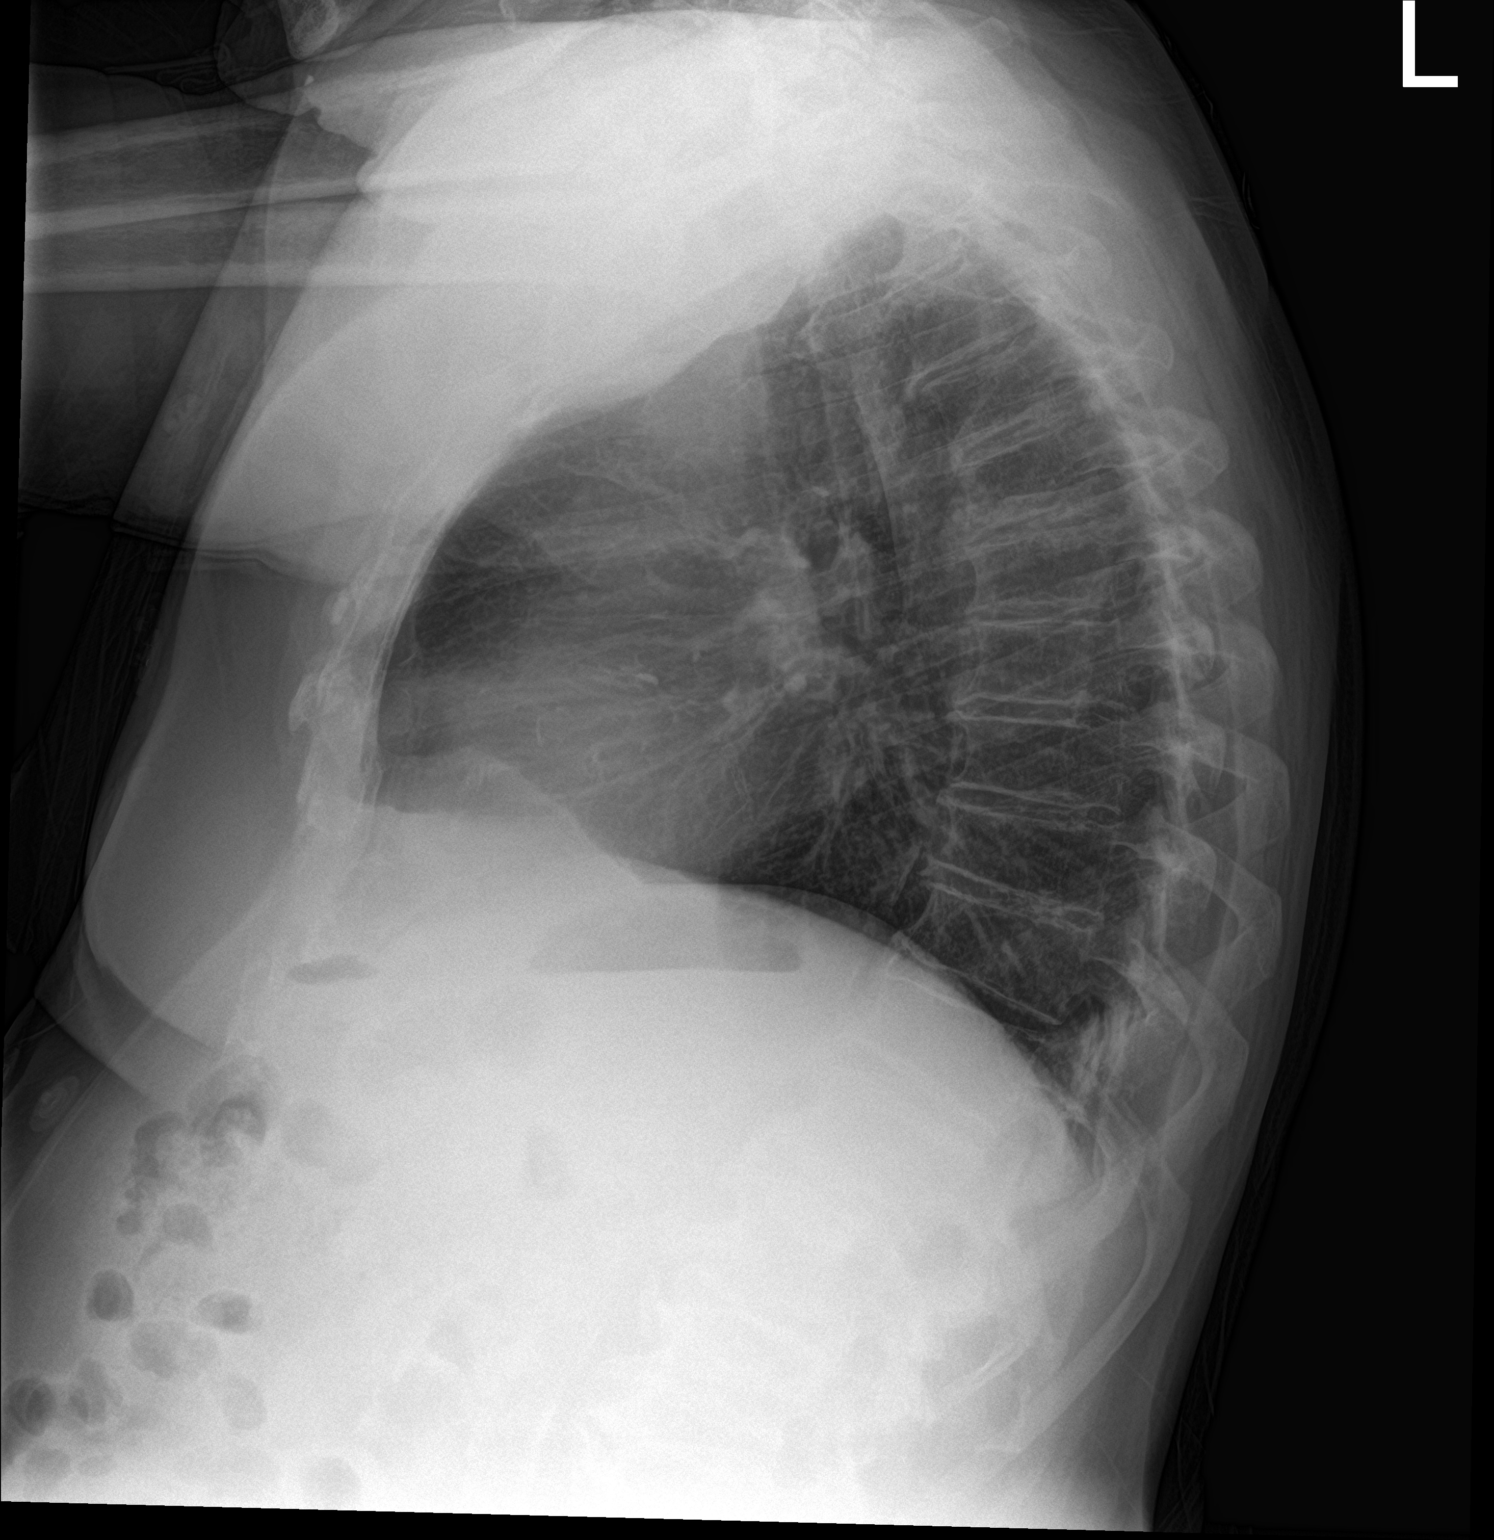

[2 of 2 positions shown; findings below may reference images not displayed]

FINDINGS: Cardiac shadow is within normal limits. Aortic calcifications are
seen. Lungs are clear bilaterally. Degenerative change of the
thoracic spine is noted.
IMPRESSION: No active cardiopulmonary disease.

## 2023-02-16 IMAGING — CT CT ANGIO CHEST
2 of 6 series · 17 of 46 positions shown · IV contrast (omnipaque)
Comparison: Radiograph yesterday.

CLINICAL DATA: PE suspected, low/intermediate prob, positive
D-dimer

Shortness of breath.
EXAM:
CT ANGIOGRAPHY CHEST WITH CONTRAST
TECHNIQUE: Multidetector CT imaging of the chest was performed using the
standard protocol during bolus administration of intravenous
contrast. Multiplanar CT image reconstructions and MIPs were
obtained to evaluate the vascular anatomy.
CONTRAST:  75mL OMNIPAQUE IOHEXOL 350 MG/ML SOLN

[Series 6: thins · axial · 0.77mm/px · z∈[+1188,+1407]mm · 14 of 241 slices shown]
[im 11/241  lung]
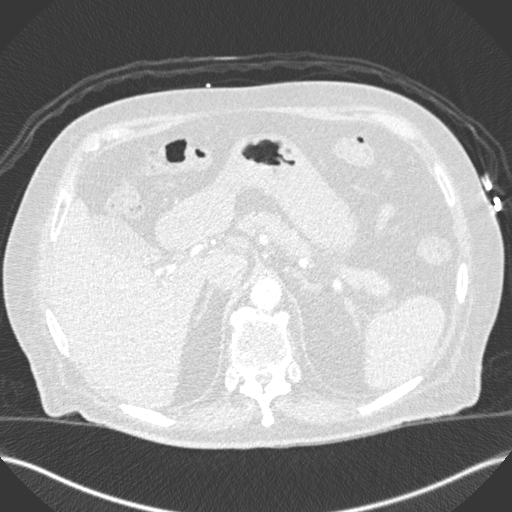
[im 32/241  soft-tissue]
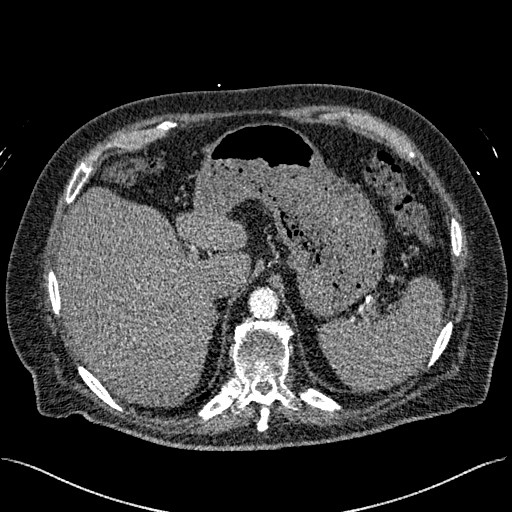
[im 42/241  lung]
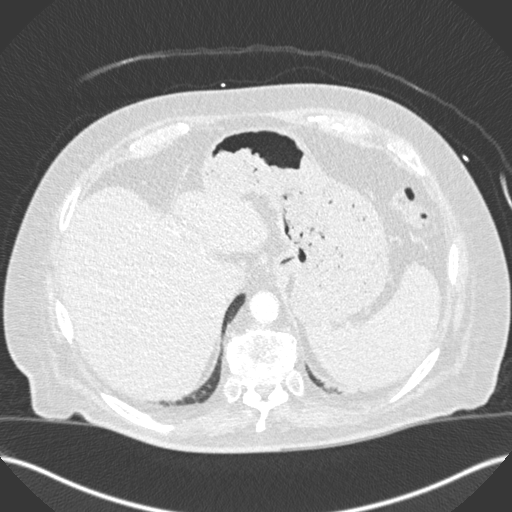
[im 63/241  soft-tissue]
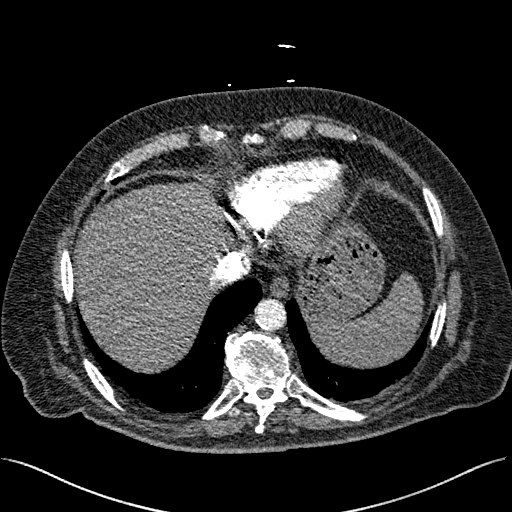
[im 84/241  lung]
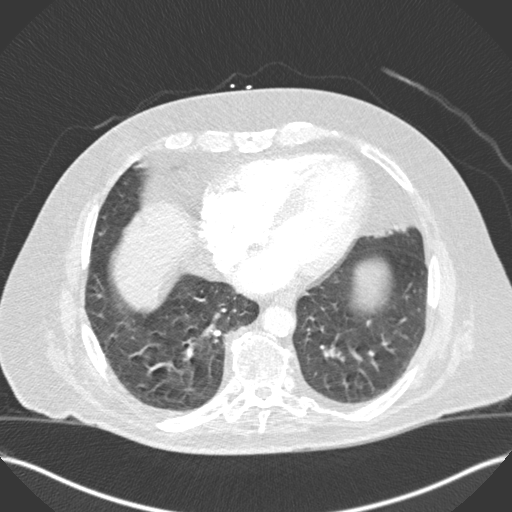
[im 94/241  soft-tissue]
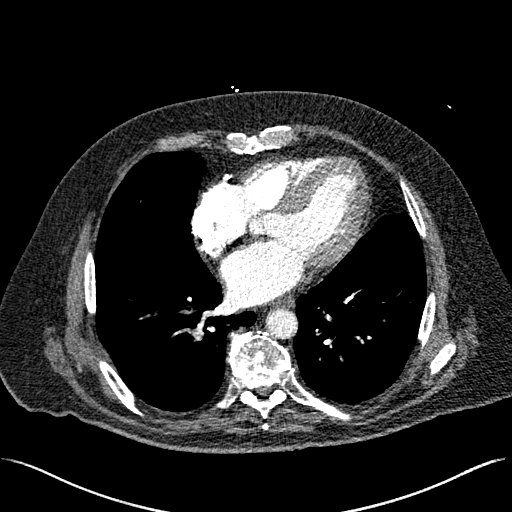
[im 115/241  lung]
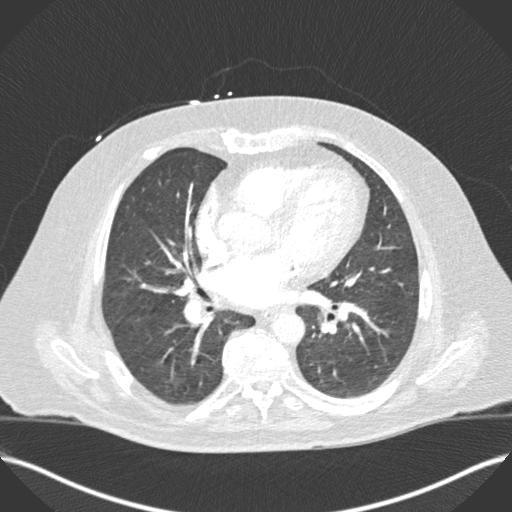
[im 126/241  soft-tissue]
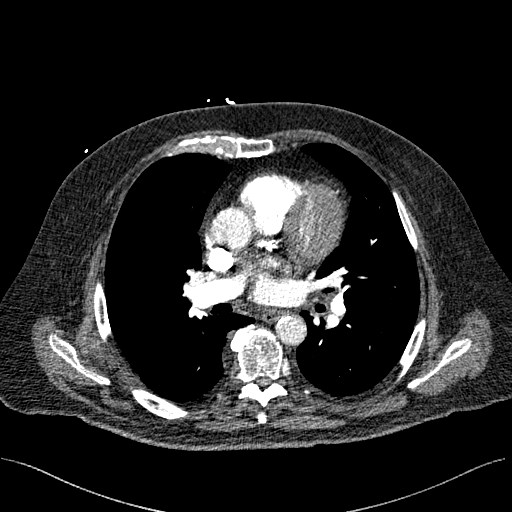
[im 147/241  lung]
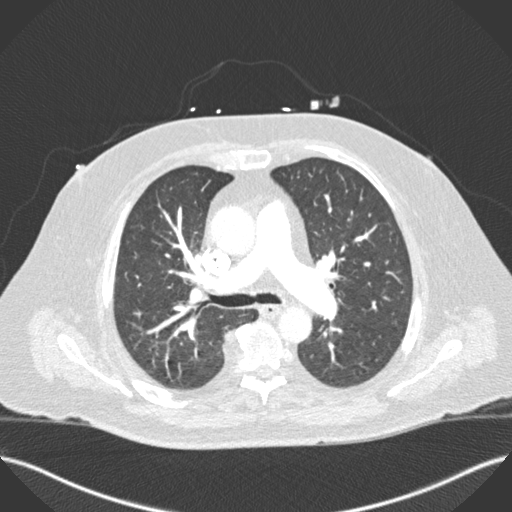
[im 157/241  soft-tissue]
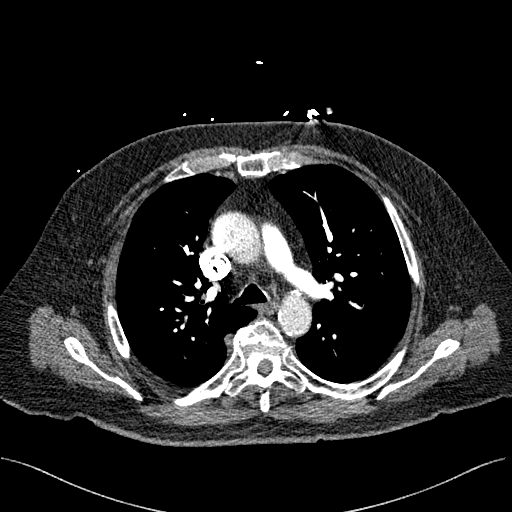
[im 178/241  lung]
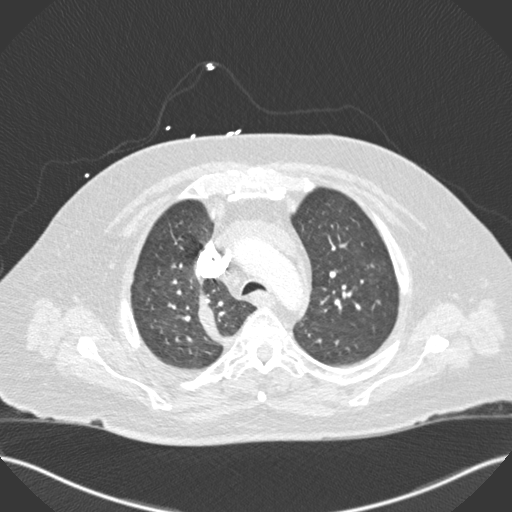
[im 199/241  soft-tissue]
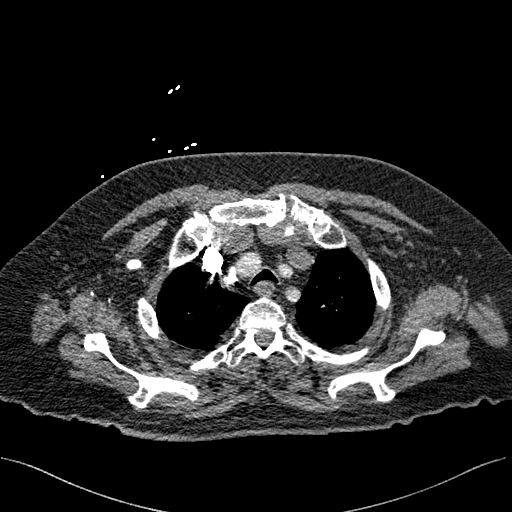
[im 209/241  lung]
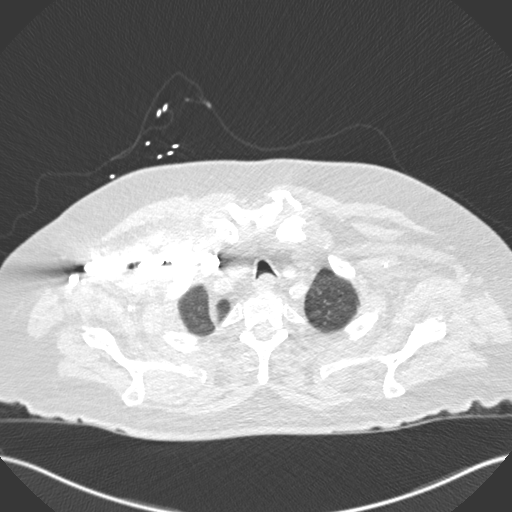
[im 230/241  soft-tissue]
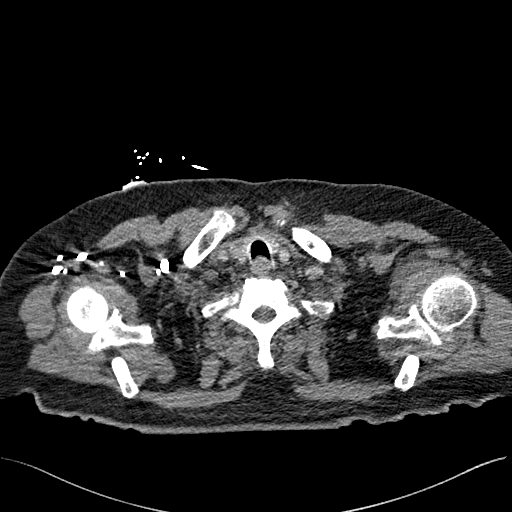

[Series 8: coronal mpr · coronal · 0.48mm/px · 3 of 147 slices shown]
[im 37/147  soft-tissue]
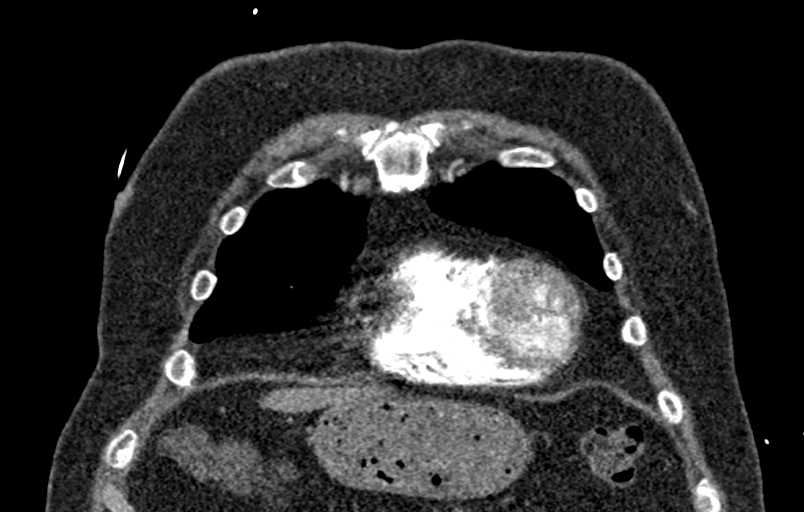
[im 74/147  soft-tissue]
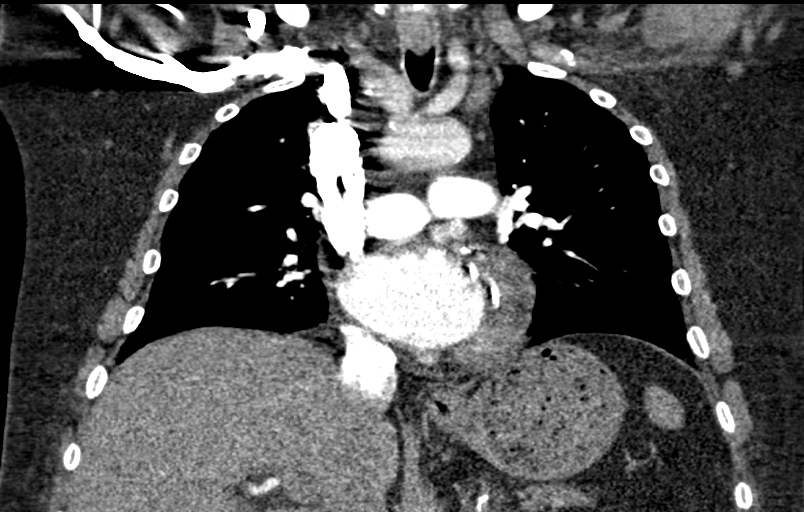
[im 110/147  soft-tissue]
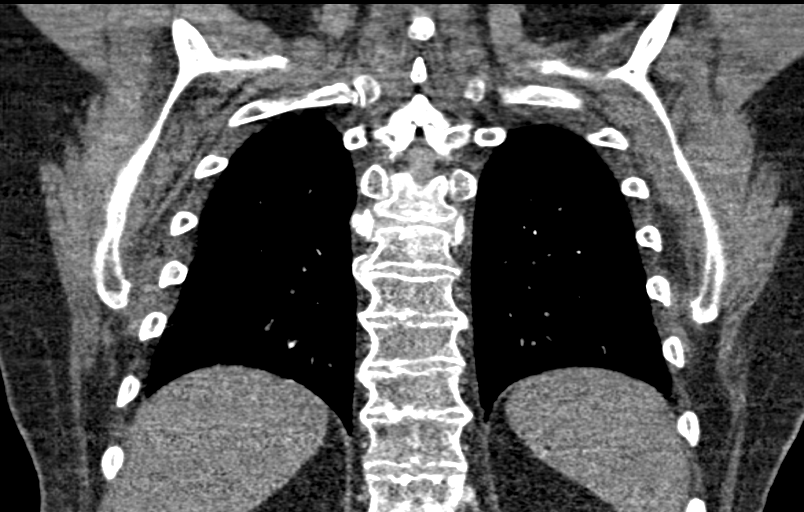

[17 of 46 positions shown; findings below may reference images not displayed]

FINDINGS: Cardiovascular: There are no filling defects within the pulmonary
arteries to suggest pulmonary embolus. Aortic atherosclerosis
without dissection or acute aortic findings. Coronary artery
calcifications. No pericardial effusion.

Mediastinum/Nodes: No mediastinal or hilar adenopathy. No thyroid
nodule. Decompressed esophagus.

Lungs/Pleura: Incidental azygos fissure. No acute or focal airspace
disease. No pleural fluid. No pulmonary edema. No pulmonary nodule
or mass. Trachea and central bronchi are patent.

Upper Abdomen: No acute or unexpected findings.

Musculoskeletal: Thoracic spondylosis. There are no acute or
suspicious osseous abnormalities. Mild degenerative change of the
shoulders.

Review of the MIP images confirms the above findings.
IMPRESSION: 1. No pulmonary embolus or acute intrathoracic abnormality.
2. Coronary artery calcifications.  Aortic atherosclerosis.

Aortic Atherosclerosis ([id number]).

## 2023-03-07 DIAGNOSIS — H40013 Open angle with borderline findings, low risk, bilateral: Secondary | ICD-10-CM | POA: Diagnosis not present

## 2023-03-07 DIAGNOSIS — H524 Presbyopia: Secondary | ICD-10-CM | POA: Diagnosis not present

## 2023-03-07 DIAGNOSIS — H532 Diplopia: Secondary | ICD-10-CM | POA: Diagnosis not present

## 2023-03-07 DIAGNOSIS — E113291 Type 2 diabetes mellitus with mild nonproliferative diabetic retinopathy without macular edema, right eye: Secondary | ICD-10-CM | POA: Diagnosis not present

## 2023-04-01 DIAGNOSIS — C61 Malignant neoplasm of prostate: Secondary | ICD-10-CM | POA: Diagnosis not present

## 2023-04-01 DIAGNOSIS — Z9861 Coronary angioplasty status: Secondary | ICD-10-CM | POA: Diagnosis not present

## 2023-04-01 DIAGNOSIS — E7800 Pure hypercholesterolemia, unspecified: Secondary | ICD-10-CM | POA: Diagnosis not present

## 2023-04-01 DIAGNOSIS — E669 Obesity, unspecified: Secondary | ICD-10-CM | POA: Diagnosis not present

## 2023-04-01 DIAGNOSIS — E1151 Type 2 diabetes mellitus with diabetic peripheral angiopathy without gangrene: Secondary | ICD-10-CM | POA: Diagnosis not present

## 2023-04-01 DIAGNOSIS — I10 Essential (primary) hypertension: Secondary | ICD-10-CM | POA: Diagnosis not present

## 2023-04-01 DIAGNOSIS — I25118 Atherosclerotic heart disease of native coronary artery with other forms of angina pectoris: Secondary | ICD-10-CM | POA: Diagnosis not present

## 2023-04-01 DIAGNOSIS — I6529 Occlusion and stenosis of unspecified carotid artery: Secondary | ICD-10-CM | POA: Diagnosis not present

## 2023-04-21 ENCOUNTER — Encounter: Payer: Self-pay | Admitting: Podiatry

## 2023-04-21 ENCOUNTER — Ambulatory Visit: Payer: Medicare PPO | Admitting: Podiatry

## 2023-04-21 DIAGNOSIS — M79675 Pain in left toe(s): Secondary | ICD-10-CM | POA: Diagnosis not present

## 2023-04-21 DIAGNOSIS — E1169 Type 2 diabetes mellitus with other specified complication: Secondary | ICD-10-CM

## 2023-04-21 DIAGNOSIS — E785 Hyperlipidemia, unspecified: Secondary | ICD-10-CM

## 2023-04-21 DIAGNOSIS — B351 Tinea unguium: Secondary | ICD-10-CM | POA: Diagnosis not present

## 2023-04-21 DIAGNOSIS — M79674 Pain in right toe(s): Secondary | ICD-10-CM | POA: Diagnosis not present

## 2023-04-21 NOTE — Progress Notes (Signed)
This patient returns to my office for at risk foot care.  This patient requires this care by a professional since this patient will be at risk due to having diabetes.  This patient is unable to cut nails himself since the patient cannot reach his nails.These nails are painful walking and wearing shoes.  He presents to the office with his wife.  This patient presents for at risk foot care today.  General Appearance  Alert, conversant and in no acute stress.  Vascular  Dorsalis pedis and posterior tibial  pulses are weakly  palpable  bilaterally.  Capillary return is within normal limits  bilaterally. Temperature is within normal limits  bilaterally.  Neurologic  Senn-Weinstein monofilament wire test within normal limits  bilaterally. Muscle power within normal limits bilaterally.  Nails Thick disfigured discolored nails with subungual debris  from hallux to fifth toes bilaterally. No evidence of bacterial infection or drainage bilaterally.  Orthopedic  No limitations of motion  feet .  No crepitus or effusions noted.  No bony pathology or digital deformities noted.  Skin  normotropic skin with no porokeratosis noted bilaterally.  No signs of infections or ulcers noted.     Onychomycosis  Pain in right toes  Pain in left toes  Consent was obtained for treatment procedures.   Mechanical debridement of nails 1-5  bilaterally performed with a nail nipper.  Filed with dremel without incident. Cauterized hallux  B/L.   Return office visit   3 months                   Told patient to return for periodic foot care and evaluation due to potential at risk complications.   Helane Gunther DPM

## 2023-07-21 ENCOUNTER — Ambulatory Visit: Admitting: Podiatry

## 2023-07-31 ENCOUNTER — Encounter: Payer: Self-pay | Admitting: Podiatry

## 2023-07-31 ENCOUNTER — Ambulatory Visit: Admitting: Podiatry

## 2023-07-31 DIAGNOSIS — E785 Hyperlipidemia, unspecified: Secondary | ICD-10-CM | POA: Diagnosis not present

## 2023-07-31 DIAGNOSIS — B351 Tinea unguium: Secondary | ICD-10-CM | POA: Diagnosis not present

## 2023-07-31 DIAGNOSIS — Z7901 Long term (current) use of anticoagulants: Secondary | ICD-10-CM | POA: Diagnosis not present

## 2023-07-31 DIAGNOSIS — M79674 Pain in right toe(s): Secondary | ICD-10-CM

## 2023-07-31 DIAGNOSIS — E1169 Type 2 diabetes mellitus with other specified complication: Secondary | ICD-10-CM | POA: Diagnosis not present

## 2023-07-31 DIAGNOSIS — M79675 Pain in left toe(s): Secondary | ICD-10-CM

## 2023-07-31 NOTE — Progress Notes (Signed)
This patient returns to my office for at risk foot care.  This patient requires this care by a professional since this patient will be at risk due to having diabetes.  This patient is unable to cut nails himself since the patient cannot reach his nails.These nails are painful walking and wearing shoes.  He presents to the office with his wife.  This patient presents for at risk foot care today.  General Appearance  Alert, conversant and in no acute stress.  Vascular  Dorsalis pedis and posterior tibial  pulses are weakly  palpable  bilaterally.  Capillary return is within normal limits  bilaterally. Temperature is within normal limits  bilaterally.  Neurologic  Senn-Weinstein monofilament wire test within normal limits  bilaterally. Muscle power within normal limits bilaterally.  Nails Thick disfigured discolored nails with subungual debris  from hallux to fifth toes bilaterally. No evidence of bacterial infection or drainage bilaterally.  Orthopedic  No limitations of motion  feet .  No crepitus or effusions noted.  No bony pathology or digital deformities noted.  Skin  normotropic skin with no porokeratosis noted bilaterally.  No signs of infections or ulcers noted.     Onychomycosis  Pain in right toes  Pain in left toes  Consent was obtained for treatment procedures.   Mechanical debridement of nails 1-5  bilaterally performed with a nail nipper.  Filed with dremel without incident. Cauterized hallux  B/L.   Return office visit   3 months                   Told patient to return for periodic foot care and evaluation due to potential at risk complications.   Helane Gunther DPM

## 2023-08-11 DIAGNOSIS — E349 Endocrine disorder, unspecified: Secondary | ICD-10-CM | POA: Diagnosis not present

## 2023-08-11 DIAGNOSIS — C61 Malignant neoplasm of prostate: Secondary | ICD-10-CM | POA: Diagnosis not present

## 2023-08-22 ENCOUNTER — Ambulatory Visit: Payer: Self-pay | Admitting: Physician Assistant

## 2023-08-22 ENCOUNTER — Ambulatory Visit (HOSPITAL_COMMUNITY)
Admission: RE | Admit: 2023-08-22 | Discharge: 2023-08-22 | Disposition: A | Discharge status: Home / Self Care | Source: Ambulatory Visit | Attending: Physician Assistant | Admitting: Physician Assistant

## 2023-08-22 DIAGNOSIS — I6523 Occlusion and stenosis of bilateral carotid arteries: Secondary | ICD-10-CM | POA: Diagnosis not present

## 2023-09-01 ENCOUNTER — Emergency Department (HOSPITAL_COMMUNITY)

## 2023-09-01 ENCOUNTER — Other Ambulatory Visit: Payer: Self-pay

## 2023-09-01 ENCOUNTER — Encounter: Payer: Self-pay | Admitting: Cardiovascular Disease

## 2023-09-01 ENCOUNTER — Ambulatory Visit: Attending: Cardiovascular Disease | Admitting: Cardiovascular Disease

## 2023-09-01 ENCOUNTER — Observation Stay (HOSPITAL_COMMUNITY)
Admission: EM | Admit: 2023-09-01 | Discharge: 2023-09-03 | Disposition: A | Discharge status: Home Health | Attending: Family Medicine | Admitting: Family Medicine

## 2023-09-01 ENCOUNTER — Encounter (HOSPITAL_COMMUNITY): Payer: Self-pay

## 2023-09-01 VITALS — BP 122/60 | HR 60 | Ht 66.0 in | Wt 188.4 lb

## 2023-09-01 DIAGNOSIS — R58 Hemorrhage, not elsewhere classified: Secondary | ICD-10-CM | POA: Diagnosis not present

## 2023-09-01 DIAGNOSIS — Z043 Encounter for examination and observation following other accident: Secondary | ICD-10-CM | POA: Diagnosis not present

## 2023-09-01 DIAGNOSIS — S32010A Wedge compression fracture of first lumbar vertebra, initial encounter for closed fracture: Secondary | ICD-10-CM | POA: Diagnosis not present

## 2023-09-01 DIAGNOSIS — S32038A Other fracture of third lumbar vertebra, initial encounter for closed fracture: Principal | ICD-10-CM

## 2023-09-01 DIAGNOSIS — I6523 Occlusion and stenosis of bilateral carotid arteries: Secondary | ICD-10-CM

## 2023-09-01 DIAGNOSIS — S3993XA Unspecified injury of pelvis, initial encounter: Secondary | ICD-10-CM | POA: Diagnosis not present

## 2023-09-01 DIAGNOSIS — M19021 Primary osteoarthritis, right elbow: Secondary | ICD-10-CM | POA: Diagnosis not present

## 2023-09-01 DIAGNOSIS — I129 Hypertensive chronic kidney disease with stage 1 through stage 4 chronic kidney disease, or unspecified chronic kidney disease: Secondary | ICD-10-CM | POA: Insufficient documentation

## 2023-09-01 DIAGNOSIS — K683 Retroperitoneal hematoma: Secondary | ICD-10-CM | POA: Diagnosis not present

## 2023-09-01 DIAGNOSIS — E782 Mixed hyperlipidemia: Secondary | ICD-10-CM

## 2023-09-01 DIAGNOSIS — M549 Dorsalgia, unspecified: Secondary | ICD-10-CM | POA: Diagnosis present

## 2023-09-01 DIAGNOSIS — S0990XA Unspecified injury of head, initial encounter: Secondary | ICD-10-CM | POA: Diagnosis not present

## 2023-09-01 DIAGNOSIS — R2681 Unsteadiness on feet: Secondary | ICD-10-CM | POA: Insufficient documentation

## 2023-09-01 DIAGNOSIS — S32039A Unspecified fracture of third lumbar vertebra, initial encounter for closed fracture: Principal | ICD-10-CM | POA: Insufficient documentation

## 2023-09-01 DIAGNOSIS — D649 Anemia, unspecified: Secondary | ICD-10-CM | POA: Diagnosis not present

## 2023-09-01 DIAGNOSIS — E119 Type 2 diabetes mellitus without complications: Secondary | ICD-10-CM | POA: Insufficient documentation

## 2023-09-01 DIAGNOSIS — I441 Atrioventricular block, second degree: Secondary | ICD-10-CM | POA: Diagnosis not present

## 2023-09-01 DIAGNOSIS — I10 Essential (primary) hypertension: Secondary | ICD-10-CM | POA: Diagnosis not present

## 2023-09-01 DIAGNOSIS — W19XXXA Unspecified fall, initial encounter: Secondary | ICD-10-CM | POA: Diagnosis not present

## 2023-09-01 DIAGNOSIS — Z96651 Presence of right artificial knee joint: Secondary | ICD-10-CM | POA: Diagnosis not present

## 2023-09-01 DIAGNOSIS — Z1152 Encounter for screening for COVID-19: Secondary | ICD-10-CM | POA: Insufficient documentation

## 2023-09-01 DIAGNOSIS — N1831 Chronic kidney disease, stage 3a: Secondary | ICD-10-CM | POA: Diagnosis not present

## 2023-09-01 DIAGNOSIS — I25119 Atherosclerotic heart disease of native coronary artery with unspecified angina pectoris: Secondary | ICD-10-CM

## 2023-09-01 DIAGNOSIS — M546 Pain in thoracic spine: Secondary | ICD-10-CM | POA: Diagnosis not present

## 2023-09-01 DIAGNOSIS — I2510 Atherosclerotic heart disease of native coronary artery without angina pectoris: Secondary | ICD-10-CM | POA: Diagnosis not present

## 2023-09-01 DIAGNOSIS — S299XXA Unspecified injury of thorax, initial encounter: Secondary | ICD-10-CM | POA: Diagnosis not present

## 2023-09-01 DIAGNOSIS — M47816 Spondylosis without myelopathy or radiculopathy, lumbar region: Secondary | ICD-10-CM | POA: Diagnosis not present

## 2023-09-01 DIAGNOSIS — I700 Atherosclerosis of aorta: Secondary | ICD-10-CM

## 2023-09-01 DIAGNOSIS — E871 Hypo-osmolality and hyponatremia: Secondary | ICD-10-CM | POA: Insufficient documentation

## 2023-09-01 DIAGNOSIS — Z96642 Presence of left artificial hip joint: Secondary | ICD-10-CM | POA: Diagnosis not present

## 2023-09-01 LAB — COMPREHENSIVE METABOLIC PANEL WITH GFR
ALT: 17 U/L (ref 0–44)
AST: 24 U/L (ref 15–41)
Albumin: 3.7 g/dL (ref 3.5–5.0)
Alkaline Phosphatase: 43 U/L (ref 38–126)
Anion gap: 14 (ref 5–15)
BUN: 31 mg/dL — ABNORMAL HIGH (ref 8–23)
CO2: 21 mmol/L — ABNORMAL LOW (ref 22–32)
Calcium: 9 mg/dL (ref 8.9–10.3)
Chloride: 98 mmol/L (ref 98–111)
Creatinine, Ser: 1.25 mg/dL — ABNORMAL HIGH (ref 0.61–1.24)
GFR, Estimated: 55 mL/min — ABNORMAL LOW (ref >60)
Glucose, Bld: 135 mg/dL — ABNORMAL HIGH (ref 70–99)
Potassium: 4.6 mmol/L (ref 3.5–5.1)
Sodium: 133 mmol/L — ABNORMAL LOW (ref 135–145)
Total Bilirubin: 0.4 mg/dL (ref 0.0–1.2)
Total Protein: 5.9 g/dL — ABNORMAL LOW (ref 6.5–8.1)

## 2023-09-01 LAB — I-STAT CHEM 8, ED
BUN: 31 mg/dL — ABNORMAL HIGH (ref 8–23)
Calcium, Ion: 1 mmol/L — ABNORMAL LOW (ref 1.15–1.40)
Chloride: 99 mmol/L (ref 98–111)
Creatinine, Ser: 1.3 mg/dL — ABNORMAL HIGH (ref 0.61–1.24)
Glucose, Bld: 131 mg/dL — ABNORMAL HIGH (ref 70–99)
HCT: 32 % — ABNORMAL LOW (ref 39.0–52.0)
Hemoglobin: 10.9 g/dL — ABNORMAL LOW (ref 13.0–17.0)
Potassium: 4.7 mmol/L (ref 3.5–5.1)
Sodium: 132 mmol/L — ABNORMAL LOW (ref 135–145)
TCO2: 21 mmol/L — ABNORMAL LOW (ref 22–32)

## 2023-09-01 LAB — CBC
HCT: 34.4 % — ABNORMAL LOW (ref 39.0–52.0)
Hemoglobin: 10.8 g/dL — ABNORMAL LOW (ref 13.0–17.0)
MCH: 30.9 pg (ref 26.0–34.0)
MCHC: 31.4 g/dL (ref 30.0–36.0)
MCV: 98.3 fL (ref 80.0–100.0)
Platelets: 136 K/uL — ABNORMAL LOW (ref 150–400)
RBC: 3.5 MIL/uL — ABNORMAL LOW (ref 4.22–5.81)
RDW: 13.5 % (ref 11.5–15.5)
WBC: 7.7 K/uL (ref 4.0–10.5)
nRBC: 0 % (ref 0.0–0.2)

## 2023-09-01 LAB — PROTIME-INR
INR: 1.1 (ref 0.8–1.2)
Prothrombin Time: 14.5 s (ref 11.4–15.2)

## 2023-09-01 MED ORDER — OXYCODONE-ACETAMINOPHEN 5-325 MG PO TABS
1.0000 | ORAL_TABLET | Freq: Once | ORAL | Status: AC
  Administered 2023-09-01: 1 via ORAL
  Filled 2023-09-01: qty 1

## 2023-09-01 MED ORDER — FENTANYL CITRATE PF 50 MCG/ML IJ SOSY
50.0000 ug | PREFILLED_SYRINGE | Freq: Once | INTRAMUSCULAR | Status: AC
  Administered 2023-09-01: 50 ug via INTRAVENOUS
  Filled 2023-09-01: qty 1

## 2023-09-01 MED ORDER — IOHEXOL 350 MG/ML SOLN
75.0000 mL | Freq: Once | INTRAVENOUS | Status: AC | PRN
  Administered 2023-09-01: 75 mL via INTRAVENOUS

## 2023-09-01 NOTE — ED Notes (Signed)
 Trauma Response Nurse Documentation   [name] Ray is a 89 y.o. male arriving to [facility] ED via EMS  On Eliquis (apixaban) daily. Trauma was activated as a Level 2 by ED charge RN based on the following trauma criteria Elderly patients > 65 with head trauma on anti-coagulation (excluding ASA). GLF Patient cleared for CT by Dr. Randol EDP. Pt transported to CT with trauma response nurse present to monitor. RN remained with the patient throughout their absence from the department for clinical observation.   GCS 15.   History   Past Medical History:  Diagnosis Date   Adenomatous polyp    history   Arthritis    knees , shoulders, hands    Atrial bigeminy    in the past   BPH (benign prostatic hyperplasia)    Carotid artery disease (HCC)    a. Doppler, January, 2012, 40-59% bilateral stenoses;  b.  Carotids 4/14:  40-59% bilat ICA, occluded R vertebral artery; f/u 1 year // Carotid US  07/2018:  Bilat ICA 1-39; R subclavian stenosis >> rpt in 1 year  // Carotid US  07/2019: Bilateral ICA 40-59; right subclavian stenosis; repeat 1 year // Carotid US  7/22: LICA 40-59, R subcl stenosis.//Carotid US  07/2021: R 1-39, L 40-59   Colon polyps    Coronary artery disease    acute inferior MI Jan 2002 with stent / Taxus stent LAD 2004 / Cypher stent prox LAD and cypher stent mid circ May 2006 / nuclear stress Dec 2009 normal EF 63% / nuclear August 2011 EF 62% / inferior thining and very mild ischemia inferior and apex / catherization Sept 2011 EF 60%  mild to moderate diffuse RCA disease, moderate discrete LAD stenosis before the stent in the proximal LAD med tx rec   Diverticulosis    Dyslipidemia    Ejection fraction    Fatty liver    history   GERD (gastroesophageal reflux disease)    Hypertension    [facility] - 5 stents total , last cardiac cath. 2011.  Cleared for  surgery by Dr. Micky- 04/23/2013   IBS (irritable bowel syndrome)    Myocardial infarction [facility]) 2002   Myoview     Myoview   07/2018: EF 63, inferior artifact, no ischemia or scar; Low Risk   Numbness and tingling in hands    resolved   Overweight(278.02)    Prostate cancer (HCC) approx 2016   treated /w  Lupron injection x1, then radium   Shortness of breath dyspnea    Type II diabetes mellitus (HCC)      Past Surgical History:  Procedure Laterality Date   CARDIAC CATHETERIZATION     last cath- 2006   COLONOSCOPY     CORONARY ANGIOPLASTY WITH STENT PLACEMENT     multiple stents in LAD:  I've got 5 stents (05/24/2013)   EYE SURGERY Bilateral    cataract surgery with lens implants   LUMBAR LAMINECTOMY/DECOMPRESSION MICRODISCECTOMY N/A 09/11/2017   Procedure: LAMINECTOMY AND FORAMINOTOMY LUMBAR One-Two, Two-Three, Three-Four, Four-Five, Five-Sacral One;  Surgeon: Mavis Purchase, MD;  Location: [facility] OR;  Service: Neurosurgery;  Laterality: N/A;  LAMINECTOMY AND FORAMINOTOMY LUMBAR One-Two,Two-Three, Three-Four, Four-Five, Five-Sacral One   NEUROPLASTY / TRANSPOSITION MEDIAN NERVE AT CARPAL TUNNEL Left    also surgery on thumb, elbow- nerve release    PROSTATE BIOPSY     followed later by HDR   TOTAL HIP ARTHROPLASTY Left 01/26/2018   Procedure: TOTAL HIP ARTHROPLASTY ANTERIOR APPROACH;  Surgeon: Melodi Lerner, MD;  Location:  [facility] ORS;  Service: Orthopedics;  Laterality: Left;    TOTAL KNEE ARTHROPLASTY Right 05/24/2013   TOTAL KNEE ARTHROPLASTY Right 05/24/2013   Procedure: RIGHT TOTAL KNEE ARTHROPLASTY;  Surgeon: Marcey Raman, MD;  Location: [facility] OR;  Service: Orthopedics;  Laterality: Right;   TOTAL KNEE ARTHROPLASTY Left 02/14/2014   Procedure: LEFT TOTAL KNEE ARTHROPLASTY;  Surgeon: Marcey Raman, MD;  Location: [facility] OR;  Service: Orthopedics;  Laterality: Left;       Initial Focused Assessment (If applicable, or please see trauma documentation): Alert/oriented male presents via EMS from [place] after a GLF. States he hit his head. C/o right flank/hip and right elbow pain. Back pain worse with positioning.  Skin tear right forearm bleeding controlled.  Airway patent, BS clear Bleeding from skin tear controlled with EMS dressing GCS 15 PERRLA 2  CT's Completed:   CT Head and CT C-Spine  T/L spine Interventions:   Plan for disposition:  {Trauma Dispo:26867}   Consults completed:  {Trauma Consults:26862} at ***.  Event Summary: Presents via EMS after a ground level fall while ambulating. C/o right flank/hip pain and right elbow pain.   Bedside handoff with ED RN Lorenza.    [name] Ray O [name] Ray  Trauma Response RN  Please call TRN at [phone] for further assistance.

## 2023-09-01 NOTE — Assessment & Plan Note (Signed)
 Recent duplex US  showed <40% ICA stenosis bilaterally. Clinical follow-up/medical therapy recommended.

## 2023-09-01 NOTE — Assessment & Plan Note (Addendum)
 Treated with simvastatin . Followed by PCP. LDL at goal at 54 mg/dL.

## 2023-09-01 NOTE — Assessment & Plan Note (Signed)
 Asymptomatic, today's EKG reviewed with PAC's, sinus rhythm. Continue observation.

## 2023-09-01 NOTE — ED Notes (Signed)
 Patient transported to CT

## 2023-09-01 NOTE — ED Notes (Signed)
 Called and placed PT on monitor with CCMD

## 2023-09-01 NOTE — ED Provider Notes (Signed)
 [place] [facility] DEPARTMENT AT  HOSPITAL Provider Note  MDM   HPI/ROS:  [name] Ray is a 89 y.o. male with a medical history as below who presents after mechanical fall.  Patient denies loss of consciousness but does endorse blood thinner use.  Last dose this morning.  Primarily complaining of pain in his right elbow and lower back.  States he tripped at the store fell into a doorway hit his right side and landed on his right hip.  Physical exam is notable for: - Skin tears to the right elbow with no significant swelling or erythema.  No acute traumatic injury to the head.  Benign neurologic exam.  Tenderness to palpation across lumbar spine.  On my initial evaluation, patient is:  -Vital signs stable. Patient afebrile, hemodynamically stable, and non-toxic appearing. -Additional history obtained from wife   Differentials include fracture, intracranial hemorrhage, spinal cord injury, intra-abdominal hemorrhage, hemo or pneumothorax.    ABCs intact, GCS 15, primary survey reassuring as no exsanguinating hemorrhage.  He has mild skin tears to his right arm that are hemostatic.  On secondary survey he does have significant tenderness to palpation in his lumbar spine.  Will proceed with CT head and C-spine given cannot rule out via [place] C-spine.  Also CT thoracic and lumbar given his pain.  X-rays obtained of chest pelvis and elbow.  Results are as below.  Did reach out to neurosurgery given his lumbar spine fracture.  Please see their note for details however in brief they recommend lumbar corset and follow-up outpatient.  CT was abdomen pelvis he does have concern for hematoma at the site of the injury.  Did speak with trauma surgery who state this is normal and expected with this type of injury.  Low concern for acute intravascular injury that require intervention.  State appropriate for discharge from their standpoint so long as he is able to ambulate with his brace.  At the  time of shift change awaiting his brace for trial of ambulation.  If unable to ambulate will require admission for PT OT and pain management.  Handoff given to the oncoming team and all questions answered the best my ability.  Interpretations, interventions, and the patient's course of care are documented below.    Clinical Course as of 09/01/23 2327  Mon Sep 01, 2023  2132 CT CERVICAL SPINE WO CONTRAST No acute cervical spine injury [RC]  2132 CT Thoracic Spine Wo Contrast No acute thoracic spine injury [RC]  2132 CT Lumbar Spine Wo Contrast L3 distraction injury.  Concern for RP hemorrhage, CT chest abdomen pelvis ordered [RC]  2133 DG Elbow 2 Views Right No acute fracture [RC]  2133 DG Knee 2 Views Right No acute [RC]  2133 CT HEAD WO CONTRAST No acute intracranial injury [RC]  2133 DG Chest Port 1 View No acute injury [RC]  2133 Creatinine(!): 1.25 Baseline [RC]  2326 CT CHEST ABDOMEN PELVIS W CONTRAST Concern for IVC injury and possible active extra from iliopsoas hematoma/vertebral injury.  Trauma surgery and IR consulted [RC]    Clinical Course User Index [RC] Sharyne Darina RAMAN, MD      Disposition:   Clinical Impression:  1. Other closed fracture of third lumbar vertebra, initial encounter [facility])      The plan for this patient was discussed with Dr. Randol, who voiced agreement and who oversaw evaluation and treatment of this patient.   Clinical Complexity A medically appropriate history, review of systems, and physical exam was  performed.  My independent interpretations of EKG, labs, and radiology are documented in the ED course above.   If decision rules were used in this patient's evaluation, they are listed below.   Click here for ABCD2, HEART and other calculatorsREFRESH Note before signing   Patient's presentation is most consistent with acute presentation with potential threat to life or bodily function.  Medical Decision Making Amount and/or  Complexity of Data Reviewed Labs: ordered. Decision-making details documented in ED Course. Radiology: ordered. Decision-making details documented in ED Course.  Risk Prescription drug management.    HPI/ROS      See MDM section for pertinent HPI and ROS. A complete ROS was performed with pertinent positives/negatives noted above.   Past Medical History:  Diagnosis Date   Adenomatous polyp    history   Arthritis    knees , shoulders, hands    Atrial bigeminy    in the past   BPH (benign prostatic hyperplasia)    Carotid artery disease (HCC)    a. Doppler, January, 2012, 40-59% bilateral stenoses;  b.  Carotids 4/14:  40-59% bilat ICA, occluded R vertebral artery; f/u 1 year // Carotid US  07/2018:  Bilat ICA 1-39; R subclavian stenosis >> rpt in 1 year  // Carotid US  07/2019: Bilateral ICA 40-59; right subclavian stenosis; repeat 1 year // Carotid US  7/22: LICA 40-59, R subcl stenosis.//Carotid US  07/2021: R 1-39, L 40-59   Colon polyps    Coronary artery disease    acute inferior MI Jan 2002 with stent / Taxus stent LAD 2004 / Cypher stent prox LAD and cypher stent mid circ May 2006 / nuclear stress Dec 2009 normal EF 63% / nuclear August 2011 EF 62% / inferior thining and very mild ischemia inferior and apex / catherization Sept 2011 EF 60%  mild to moderate diffuse RCA disease, moderate discrete LAD stenosis before the stent in the proximal LAD med tx rec   Diverticulosis    Dyslipidemia    Ejection fraction    Fatty liver    history   GERD (gastroesophageal reflux disease)    Hypertension    [facility] - 5 stents total , last cardiac cath. 2011.  Cleared for  surgery by Dr. Micky- 04/23/2013   IBS (irritable bowel syndrome)    Myocardial infarction (HCC) 2002   Myoview     Myoview  07/2018: EF 63, inferior artifact, no ischemia or scar; Low Risk   Numbness and tingling in hands    resolved   Overweight(278.02)    Prostate cancer (HCC) approx 2016   treated /w  Lupron  injection x1, then radium   Shortness of breath dyspnea    Type II diabetes mellitus (HCC)     Past Surgical History:  Procedure Laterality Date   CARDIAC CATHETERIZATION     last cath- 2006   COLONOSCOPY     CORONARY ANGIOPLASTY WITH STENT PLACEMENT     multiple stents in LAD:  I've got 5 stents (05/24/2013)   EYE SURGERY Bilateral    cataract surgery with lens implants   LUMBAR LAMINECTOMY/DECOMPRESSION MICRODISCECTOMY N/A 09/11/2017   Procedure: LAMINECTOMY AND FORAMINOTOMY LUMBAR One-Two, Two-Three, Three-Four, Four-Five, Five-Sacral One;  Surgeon: Mavis Purchase, MD;  Location: [facility] OR;  Service: Neurosurgery;  Laterality: N/A;  LAMINECTOMY AND FORAMINOTOMY LUMBAR One-Two,Two-Three, Three-Four, Four-Five, Five-Sacral One   NEUROPLASTY / TRANSPOSITION MEDIAN NERVE AT CARPAL TUNNEL Left    also surgery on thumb, elbow- nerve release    PROSTATE BIOPSY  followed later by HDR   TOTAL HIP ARTHROPLASTY Left 01/26/2018   Procedure: TOTAL HIP ARTHROPLASTY ANTERIOR APPROACH;  Surgeon: Melodi Lerner, MD;  Location: [facility] ORS;  Service: Orthopedics;  Laterality: Left;    TOTAL KNEE ARTHROPLASTY Right 05/24/2013   TOTAL KNEE ARTHROPLASTY Right 05/24/2013   Procedure: RIGHT TOTAL KNEE ARTHROPLASTY;  Surgeon: Marcey Raman, MD;  Location: [facility] OR;  Service: Orthopedics;  Laterality: Right;   TOTAL KNEE ARTHROPLASTY Left 02/14/2014   Procedure: LEFT TOTAL KNEE ARTHROPLASTY;  Surgeon: Marcey Raman, MD;  Location: [facility] OR;  Service: Orthopedics;  Laterality: Left;      Physical Exam   Vitals:   09/01/23 2015 09/01/23 2030 09/01/23 2310 09/01/23 2319  BP: (!) 111/51 (!) 99/49 (!) 137/52   Pulse: (!) 57 (!) 59 68   Resp: 10 14 18    Temp:    98.3 F (36.8 C)  TempSrc:    Oral  SpO2: 98% 100% 100%   Weight:      Height:        Physical Exam Vitals and nursing note reviewed.  Constitutional:      General: He is not in acute distress.    Appearance: He is well-developed.  HENT:     Head:  Normocephalic and atraumatic.  Eyes:     Conjunctiva/sclera: Conjunctivae normal.  Cardiovascular:     Rate and Rhythm: Normal rate and regular rhythm.     Heart sounds: No murmur heard. Pulmonary:     Effort: Pulmonary effort is normal. No respiratory distress.     Breath sounds: Normal breath sounds.  Abdominal:     Palpations: Abdomen is soft.     Tenderness: There is no abdominal tenderness.  Musculoskeletal:        General: No swelling.     Cervical back: Neck supple.     Comments: Skin tear to right elbow Tenderness to palpation lumbar spine  Skin:    General: Skin is warm and dry.     Capillary Refill: Capillary refill takes less than 2 seconds.  Neurological:     General: No focal deficit present.     Mental Status: He is alert and oriented to person, place, and time. Mental status is at baseline.     GCS: GCS eye subscore is 4. GCS verbal subscore is 5. GCS motor subscore is 6.     Cranial Nerves: Cranial nerves 2-12 are intact.     Sensory: Sensation is intact.     Motor: Motor function is intact.  Psychiatric:        Mood and Affect: Mood normal.      Procedures   If procedures were preformed on this patient, they are listed below:  Procedures   @BBSIG @   Please note that this documentation was produced with the assistance of voice-to-text technology and may contain errors.    Sharyne Darina RAMAN, MD 09/02/23 0000    Randol Simmonds, MD 09/03/23 [phone]

## 2023-09-01 NOTE — ED Notes (Signed)
 Contacted ortho tech to place brace and ortho tech stated that if they are getting admitted, upstairs places the brace up there.

## 2023-09-01 NOTE — ED Triage Notes (Signed)
 Pt bib [facility] from [employer] where he was rushing to the bathroom, and tripped. He says that he hit his head and has right elbow and hip pain. Pt has skin tear on right elbow, bruising to right knee. Pt takes plavix . Denies LOC

## 2023-09-01 NOTE — Progress Notes (Signed)
 Chaplain responded to Level 2 Trauma.  Visited w/ pt and family offering ministry of presence while hearing the story of how pt tripped on his shoe.  Of how he is so careful and hasn't had a fall in a long time.  Wife asked for prayers that CAT scan would not reveal any serious injury to his back.  Chaplain prayed at bedside with pt, wife and son.  [place] Chaplain

## 2023-09-01 NOTE — Assessment & Plan Note (Addendum)
 Treated with clopidogrel , ramipril , simvastatin .  The patient is maintaining a markable good activity level at age 89, still going to the gym 2 to 3 days/week.

## 2023-09-01 NOTE — Progress Notes (Signed)
 Cardiology Office Note:    Date:  09/01/2023   ID:  [name] Ray, DOB 1933/03/03, MRN [id number]  PCP:  Vernadine Charlie ORN, MD   [facility] Providers Cardiologist:  Ozell Fell, MD Cardiology APP:  Lelon Glendia [name]     Referring MD: Vernadine Charlie ORN, MD   Chief Complaint  Patient presents with   Coronary Artery Disease    History of Present Illness:    [name] Ray is a 89 y.o. male with a hx of:  The patient is here alone today.  His cardiovascular problem list includes:   Coronary artery disease S/p Inf MI in 2002 tx with PCI S/p Taxus DES to LAD in 2004 S/p Cypher DES to pLAD and Cypher DES to mLCx in 05/2004 LHC in 2011 - RCA, LCx and LAD stents patent (mild to mod non-obs dz in LAD, LCx, RCA) Myoview  07/2018: low risk  Echo 07/04/20: EF 55-60, GR 1 DD, no RWMA, mild LVH, normal RVSF  Diabetes Mellitus Hypertension Amlodipine  D/C'd 2024 with low BP and dizziness Hyperlipidemia Chronic kidney disease II-III Carotid artery disease US  07/2018: bilat ICA 1-39; R subcl stenosis  US  07/26/20: L 40-59; ? Prox bilat VA stenosis; R subclavian stenosis  US  07/31/2021: R ICA 1-39, LICA 40-59 (no VA or subclavian stenosis noted) BPH Prostate CA s/p radiation Aortic atherosclerosis  Mobitz I  Monitor 02/2021: NSR, occ Mobitz I, no high grade HB, no sustained arrhythmias, AFib/Flutter  The patient is here with his wife today. He's been going to the [facility] three days per week and he works out for about 90 minutes. He's using an Elliptical machine for 25 minutes and doing leg and core strengthening exercises. Today, he denies symptoms of palpitations, chest pain, shortness of breath, orthopnea, PND, lower extremity edema, dizziness, or syncope. He has no complaints today except for easy bruising of his arms.   Current Medications: Current Meds  Medication Sig   acetaminophen  (TYLENOL ) 500 MG tablet Take 1 tablet (500 mg total) by mouth every 6 (six) hours as  needed for mild pain.   amoxicillin (AMOXIL) 500 MG tablet Take by mouth. Prior to dental appointment   clopidogrel  (PLAVIX ) 75 MG tablet Take 75 mg by mouth daily.   gabapentin (NEURONTIN) 300 MG capsule Take 300 mg by mouth 2 (two) times daily.   Guaifenesin  1200 MG TB12 Take 1,200 mg by mouth daily as needed (cough).   JENTADUETO  XR 2.05-998 MG TB24 Take 1 tablet by mouth daily.   loperamide  (IMODIUM  A-D) 2 MG tablet Take 2 mg by mouth 4 (four) times daily as needed for diarrhea or loose stools.   loratadine  (CLARITIN ) 10 MG tablet Take 10 mg by mouth daily as needed for allergies.   Multiple Vitamin (MULTI-VITAMIN) tablet Take 1 tablet by mouth daily.   nitroGLYCERIN  (NITROSTAT ) 0.4 MG SL tablet Place 1 tablet (0.4 mg total) under the tongue every 5 (five) minutes as needed for chest pain. Please keep upcoming appt. Thank you   ramipril  (ALTACE ) 10 MG capsule Take 10 mg by mouth daily after breakfast.   simvastatin  (ZOCOR ) 20 MG tablet Take 20 mg by mouth at bedtime.   tamsulosin  (FLOMAX ) 0.4 MG CAPS capsule Take 0.4 mg by mouth at bedtime.     Allergies:   Atorvastatin, Rosuvastatin, and Clindamycin   ROS:   Please see the history of present illness.    All other systems reviewed and are negative.  EKGs/Labs/Other Studies Reviewed:    The  following studies were reviewed today: Cardiac Studies & Procedures   ______________________________________________________________________________________________   STRESS TESTS  MYOCARDIAL PERFUSION IMAGING 07/22/2018  Interpretation Summary  Nuclear stress EF: 63%. The left ventricular ejection fraction is normal (55-65%).  There was no ST segment deviation noted during stress.  Defect 1: There is a small defect of mild severity present in the basal inferior and mid inferior location. This is consistent with artifart.  This is a low risk study. No evidence of ischemia or infarction  The study is normal.    ECHOCARDIOGRAM  ECHOCARDIOGRAM COMPLETE 07/04/2020  Narrative ECHOCARDIOGRAM REPORT    Patient Name:   [name] Ray Date of Exam: 07/04/2020 Medical Rec #:  [id number]       Height:       66.0 in Accession #:    [id number]      Weight:       197.0 lb Date of Birth:  [birth year]        BSA:          1.987 m Patient Age:    86 years        BP:           151/62 mmHg Patient Gender: M               HR:           69 bpm. Exam Location:  Inpatient  Procedure: 2D Echo, Color Doppler and Cardiac Doppler  Indications:    R07.9* Chest pain, unspecified  History:        Patient has prior history of Echocardiogram examinations, most recent 05/06/2014.  Sonographer:    Vernell Hammersmith RDCS Referring Phys: [id number] RONDELL A SMITH  IMPRESSIONS   1. Left ventricular ejection fraction, by estimation, is 55 to 60%. The left ventricle has normal function. The left ventricle has no regional wall motion abnormalities. There is mild left ventricular hypertrophy. Left ventricular diastolic parameters are consistent with Grade I diastolic dysfunction (impaired relaxation). 2. Right ventricular systolic function is normal. The right ventricular size is normal. 3. The mitral valve is normal in structure. No evidence of mitral valve regurgitation. No evidence of mitral stenosis. 4. The aortic valve is tricuspid. Aortic valve regurgitation is not visualized. No aortic stenosis is present.  FINDINGS Left Ventricle: Left ventricular ejection fraction, by estimation, is 55 to 60%. The left ventricle has normal function. The left ventricle has no regional wall motion abnormalities. The left ventricular internal cavity size was normal in size. There is mild left ventricular hypertrophy. Left ventricular diastolic parameters are consistent with Grade I diastolic dysfunction (impaired relaxation).  Right Ventricle: The right ventricular size is normal. Right ventricular systolic function is normal.  Left Atrium:  Left atrial size was normal in size.  Right Atrium: Right atrial size was normal in size.  Pericardium: There is no evidence of pericardial effusion.  Mitral Valve: The mitral valve is normal in structure. No evidence of mitral valve regurgitation. No evidence of mitral valve stenosis.  Tricuspid Valve: The tricuspid valve is normal in structure. Tricuspid valve regurgitation is trivial. No evidence of tricuspid stenosis.  Aortic Valve: The aortic valve is tricuspid. Aortic valve regurgitation is not visualized. No aortic stenosis is present. Aortic valve mean gradient measures 7.0 mmHg. Aortic valve peak gradient measures 10.8 mmHg. Aortic valve area, by VTI measures 1.95 cm.  Pulmonic Valve: The pulmonic valve was normal in structure. Pulmonic valve regurgitation is not visualized. No evidence of pulmonic stenosis.  Aorta: The  aortic root is normal in size and structure.  Venous: The inferior vena cava was not well visualized.  IAS/Shunts: The interatrial septum was not well visualized.   LEFT VENTRICLE PLAX 2D LVIDd:         4.10 cm  Diastology LVIDs:         2.80 cm  LV e' medial:    7.07 cm/s LV PW:         1.20 cm  LV E/e' medial:  9.5 LV IVS:        1.10 cm  LV e' lateral:   6.64 cm/s LVOT diam:     2.00 cm  LV E/e' lateral: 10.2 LV SV:         73 LV SV Index:   37 LVOT Area:     3.14 cm   RIGHT VENTRICLE RV Basal diam:  4.20 cm RV Mid diam:    3.80 cm  LEFT ATRIUM             Index       RIGHT ATRIUM           Index LA diam:        4.30 cm 2.16 cm/m  RA Area:     19.00 cm LA Vol (A2C):   61.7 ml 31.05 ml/m RA Volume:   45.50 ml  22.90 ml/m LA Vol (A4C):   51.2 ml 25.77 ml/m LA Biplane Vol: 55.7 ml 28.03 ml/m AORTIC VALVE AV Area (Vmax):    1.91 cm AV Area (Vmean):   1.81 cm AV Area (VTI):     1.95 cm AV Vmax:           164.00 cm/s AV Vmean:          125.000 cm/s AV VTI:            0.374 m AV Peak Grad:      10.8 mmHg AV Mean Grad:      7.0  mmHg LVOT Vmax:         99.80 cm/s LVOT Vmean:        72.100 cm/s LVOT VTI:          0.232 m LVOT/AV VTI ratio: 0.62  AORTA Ao Root diam: 3.50 cm Ao Asc diam:  3.00 cm  MITRAL VALVE MV Area (PHT): 2.66 cm     SHUNTS MV Decel Time: 285 msec     Systemic VTI:  0.23 m MV E velocity: 67.40 cm/s   Systemic Diam: 2.00 cm MV A velocity: 123.00 cm/s MV E/A ratio:  0.55  Redell Shallow MD Electronically signed by Redell Shallow MD Signature Date/Time: 07/04/2020/2:58:23 PM    Final    MONITORS  LONG TERM MONITOR (3-14 DAYS) 02/28/2021  Narrative Patch Wear Time:  14 days and 0 hours (2023-01-24T12:00:12-498 to 2023-02-07T12:00:16-0500)  Patient had a min HR of 28 bpm, max HR of 158 bpm, and avg HR of 66 bpm. Predominant underlying rhythm was Sinus Rhythm. 1 run of Supraventricular Tachycardia occurred lasting 6 beats with a max rate of 158 bpm (avg 149 bpm). Second Degree AV Block-Mobitz I (Wenckebach) was present. Isolated SVEs were rare (<1.0%), SVE Couplets were rare (<1.0%), and SVE Triplets were rare (<1.0%). Isolated VEs were rare (<1.0%, 678), VE Couplets were rare (<1.0%, 30), and VE Triplets were rare (<1.0%, 2).  SUMMARY: The basic rhythm is normal sinus with an average heart rate of 66 bpm.  Occasional Mobitz 1 AV block noted.  No high-grade heart block.  No sustained arrhythmias.  No atrial fibrillation or flutter.       ______________________________________________________________________________________________      EKG:   EKG Interpretation Date/Time:  Monday September 01 2023 08:34:36 EDT Ventricular Rate:  60 PR Interval:  156 QRS Duration:  82 QT Interval:  384 QTC Calculation: 384 R Axis:   -21  Text Interpretation: Sinus rhythm with Premature atrial complexes When compared with ECG of 04-Jul-2020 05:00, No significant change was found Confirmed by Wonda Sharper [phone]) on 09/01/2023 8:42:08 AM    Recent Labs: No results found for requested labs  within last 365 days.  Recent Lipid Panel    Component Value Date/Time   CHOL 122 07/04/2020 1107   TRIG 97 07/04/2020 1107   HDL 52 07/04/2020 1107   CHOLHDL 2.3 07/04/2020 1107   VLDL 19 07/04/2020 1107   LDLCALC 51 07/04/2020 1107     Risk Assessment/Calculations:                Physical Exam:    VS:  BP 122/60   Pulse 60   Ht 5' 6 (1.676 m)   Wt 188 lb 6.4 oz (85.5 kg)   SpO2 98%   BMI 30.41 kg/m     Wt Readings from Last 3 Encounters:  09/01/23 188 lb 6.4 oz (85.5 kg)  01/20/23 196 lb (88.9 kg)  06/07/22 196 lb (88.9 kg)     GEN:  Well nourished, well developed in no acute distress HEENT: Normal NECK: No JVD; No carotid bruits LYMPHATICS: No lymphadenopathy CARDIAC: RRR, no murmurs, rubs, gallops RESPIRATORY:  Clear to auscultation without rales, wheezing or rhonchi  ABDOMEN: Soft, non-tender, non-distended MUSCULOSKELETAL:  No edema; No deformity  SKIN: Warm and dry, multiple ecchymoses of his arms.  NEUROLOGIC:  Alert and oriented x 3 PSYCHIATRIC:  Normal affect   Assessment & Plan Coronary artery disease involving native coronary artery of native heart with angina pectoris (HCC) Treated with clopidogrel , ramipril , simvastatin .  The patient is maintaining a markable good activity level at age 46, still going to the gym 2 to 3 days/week. Bilateral carotid artery stenosis Recent duplex US  showed <40% ICA stenosis bilaterally. Clinical follow-up/medical therapy recommended.  Essential hypertension Amlodipine  discontinued last year. On ramipril .  Blood pressure stabilized, no symptoms of lightheadedness or dizziness. Mixed hyperlipidemia Treated with simvastatin . Followed by PCP. LDL at goal at 54 mg/dL.  Aortic atherosclerosis (HCC) Clopidogrel , simvastatin . AV block, Mobitz 1 Asymptomatic, today's EKG reviewed with PAC's, sinus rhythm. Continue observation.       Medication Adjustments/Labs and Tests Ordered: Current medicines are reviewed at  length with the patient today.  Concerns regarding medicines are outlined above.  Orders Placed This Encounter  Procedures   EKG 12-Lead   No orders of the defined types were placed in this encounter.   Patient Instructions  Medication Instructions:  No medication changes were made at this visit. Continue current regimen.   *If you need a refill on your cardiac medications before your next appointment, please call your pharmacy*  Lab Work: None ordered today. If you have labs (blood work) drawn today and your tests are completely normal, you will receive your results only by: MyChart Message (if you have MyChart) OR A paper copy in the mail If you have any lab test that is abnormal or we need to change your treatment, we will call you to review the results.  Testing/Procedures: None ordered today.  Follow-Up: At [facility], you and your health needs are  our priority.  As part of our continuing mission to provide you with exceptional heart care, our providers are all part of one team.  This team includes your primary Cardiologist (physician) and Advanced Practice Providers or APPs (Physician Assistants and Nurse Practitioners) who all work together to provide you with the care you need, when you need it.  Your next appointment:   1 year(s)  Provider:   Ozell Fell, MD      Signed, Ozell Fell, MD  09/01/2023 9:02 AM    [facility]

## 2023-09-01 NOTE — Patient Instructions (Signed)
 Medication Instructions:  No medication changes were made at this visit. Continue current regimen.   *If you need a refill on your cardiac medications before your next appointment, please call your pharmacy*  Lab Work: None ordered today. If you have labs (blood work) drawn today and your tests are completely normal, you will receive your results only by: MyChart Message (if you have MyChart) OR A paper copy in the mail If you have any lab test that is abnormal or we need to change your treatment, we will call you to review the results.  Testing/Procedures: None ordered today.  Follow-Up: At [facility], you and your health needs are our priority.  As part of our continuing mission to provide you with exceptional heart care, our providers are all part of one team.  This team includes your primary Cardiologist (physician) and Advanced Practice Providers or APPs (Physician Assistants and Nurse Practitioners) who all work together to provide you with the care you need, when you need it.  Your next appointment:   1 year(s)  Provider:   Ozell Fell, MD

## 2023-09-02 DIAGNOSIS — I871 Compression of vein: Secondary | ICD-10-CM | POA: Diagnosis not present

## 2023-09-02 DIAGNOSIS — S32038A Other fracture of third lumbar vertebra, initial encounter for closed fracture: Secondary | ICD-10-CM

## 2023-09-02 DIAGNOSIS — Z7902 Long term (current) use of antithrombotics/antiplatelets: Secondary | ICD-10-CM | POA: Diagnosis not present

## 2023-09-02 DIAGNOSIS — Z96653 Presence of artificial knee joint, bilateral: Secondary | ICD-10-CM | POA: Diagnosis not present

## 2023-09-02 DIAGNOSIS — S32039A Unspecified fracture of third lumbar vertebra, initial encounter for closed fracture: Secondary | ICD-10-CM | POA: Diagnosis present

## 2023-09-02 DIAGNOSIS — K683 Retroperitoneal hematoma: Secondary | ICD-10-CM | POA: Diagnosis not present

## 2023-09-02 DIAGNOSIS — W010XXA Fall on same level from slipping, tripping and stumbling without subsequent striking against object, initial encounter: Secondary | ICD-10-CM | POA: Diagnosis not present

## 2023-09-02 DIAGNOSIS — I2510 Atherosclerotic heart disease of native coronary artery without angina pectoris: Secondary | ICD-10-CM | POA: Diagnosis not present

## 2023-09-02 LAB — COMPREHENSIVE METABOLIC PANEL WITH GFR
ALT: 17 U/L (ref 0–44)
AST: 23 U/L (ref 15–41)
Albumin: 3.4 g/dL — ABNORMAL LOW (ref 3.5–5.0)
Alkaline Phosphatase: 40 U/L (ref 38–126)
Anion gap: 9 (ref 5–15)
BUN: 28 mg/dL — ABNORMAL HIGH (ref 8–23)
CO2: 21 mmol/L — ABNORMAL LOW (ref 22–32)
Calcium: 8.6 mg/dL — ABNORMAL LOW (ref 8.9–10.3)
Chloride: 102 mmol/L (ref 98–111)
Creatinine, Ser: 1.14 mg/dL (ref 0.61–1.24)
GFR, Estimated: >60 mL/min (ref >60)
Glucose, Bld: 96 mg/dL (ref 70–99)
Potassium: 4.7 mmol/L (ref 3.5–5.1)
Sodium: 132 mmol/L — ABNORMAL LOW (ref 135–145)
Total Bilirubin: 0.8 mg/dL (ref 0.0–1.2)
Total Protein: 5.6 g/dL — ABNORMAL LOW (ref 6.5–8.1)

## 2023-09-02 LAB — CBC
HCT: 30.5 % — ABNORMAL LOW (ref 39.0–52.0)
Hemoglobin: 10 g/dL — ABNORMAL LOW (ref 13.0–17.0)
MCH: 30.8 pg (ref 26.0–34.0)
MCHC: 32.8 g/dL (ref 30.0–36.0)
MCV: 93.8 fL (ref 80.0–100.0)
Platelets: 125 K/uL — ABNORMAL LOW (ref 150–400)
RBC: 3.25 MIL/uL — ABNORMAL LOW (ref 4.22–5.81)
RDW: 13.2 % (ref 11.5–15.5)
WBC: 7.5 K/uL (ref 4.0–10.5)
nRBC: 0 % (ref 0.0–0.2)

## 2023-09-02 LAB — GLUCOSE, CAPILLARY
Glucose-Capillary: 99 mg/dL (ref 70–99)
Glucose-Capillary: 125 mg/dL — ABNORMAL HIGH (ref 70–99)
Glucose-Capillary: 116 mg/dL — ABNORMAL HIGH (ref 70–99)
Glucose-Capillary: 108 mg/dL — ABNORMAL HIGH (ref 70–99)

## 2023-09-02 LAB — HEMOGLOBIN A1C
Hgb A1c MFr Bld: 5.6 % (ref 4.8–5.6)
Mean Plasma Glucose: 114.02 mg/dL

## 2023-09-02 MED ORDER — SIMVASTATIN 20 MG PO TABS
20.0000 mg | ORAL_TABLET | Freq: Every day | ORAL | Status: DC
  Administered 2023-09-02: 20 mg via ORAL
  Filled 2023-09-02: qty 1

## 2023-09-02 MED ORDER — ACETAMINOPHEN 500 MG PO TABS
1000.0000 mg | ORAL_TABLET | Freq: Three times a day (TID) | ORAL | Status: DC
  Administered 2023-09-02 – 2023-09-03 (×4): 1000 mg via ORAL
  Filled 2023-09-02 (×4): qty 2

## 2023-09-02 MED ORDER — METHOCARBAMOL 1000 MG/10ML IJ SOLN: 500.0000 mg | Freq: Three times a day (TID) | INTRAMUSCULAR | Status: DC | PRN

## 2023-09-02 MED ORDER — OXYCODONE HCL 5 MG PO TABS: 5.0000 mg | ORAL_TABLET | ORAL | Status: DC | PRN

## 2023-09-02 MED ORDER — OXYCODONE HCL 5 MG PO TABS
2.5000 mg | ORAL_TABLET | ORAL | Status: DC | PRN
  Administered 2023-09-02 (×2): 2.5 mg via ORAL
  Filled 2023-09-02 (×2): qty 1

## 2023-09-02 MED ORDER — ONDANSETRON HCL 4 MG/2ML IJ SOLN: 4.0000 mg | Freq: Four times a day (QID) | INTRAMUSCULAR | Status: DC | PRN

## 2023-09-02 MED ORDER — INSULIN ASPART 100 UNIT/ML IJ SOLN: 0.0000 [IU] | Freq: Every day | INTRAMUSCULAR | Status: DC

## 2023-09-02 MED ORDER — TAMSULOSIN HCL 0.4 MG PO CAPS
0.4000 mg | ORAL_CAPSULE | Freq: Every day | ORAL | Status: DC
  Administered 2023-09-02 (×2): 0.4 mg via ORAL
  Filled 2023-09-02 (×2): qty 1

## 2023-09-02 MED ORDER — RAMIPRIL 5 MG PO CAPS
10.0000 mg | ORAL_CAPSULE | Freq: Every day | ORAL | Status: DC
  Administered 2023-09-02: 10 mg via ORAL
  Filled 2023-09-02 (×2): qty 2

## 2023-09-02 MED ORDER — INSULIN ASPART 100 UNIT/ML IJ SOLN
0.0000 [IU] | Freq: Three times a day (TID) | INTRAMUSCULAR | Status: DC
  Administered 2023-09-02: 3 [IU] via SUBCUTANEOUS
  Administered 2023-09-03: 1 [IU] via SUBCUTANEOUS

## 2023-09-02 MED ORDER — ONDANSETRON HCL 4 MG PO TABS: 4.0000 mg | ORAL_TABLET | Freq: Four times a day (QID) | ORAL | Status: DC | PRN

## 2023-09-02 MED ORDER — LIDOCAINE 5 % EX PTCH
3.0000 | MEDICATED_PATCH | CUTANEOUS | Status: DC
  Administered 2023-09-02 – 2023-09-03 (×2): 3 via TRANSDERMAL
  Filled 2023-09-02 (×2): qty 3

## 2023-09-02 NOTE — Evaluation (Addendum)
 Physical Therapy Evaluation  Patient Details Name: [name] Ray MRN: [id number] DOB: [birth year] Today's Date: 09/02/2023  History of Present Illness  Pt is an 89 y.o. male who presents 09/01/23 s/p mechanical fall, sustaining an L3 vertebral fracture. Plan is for non-operative management with back brace. PMH significant of CAD, prostate cancer, HTN, T2DM.  Clinical Impression  Pt admitted with above diagnosis. At the time of PT eval, pt was able to demonstrate transfers and ambulation with gross CGA to min assist and RW for support. Pt was educated on precautions, brace application/wearing schedule, appropriate activity progression, and positioning recommendations. Wife present throughout session. Increased time spent answering all questions within scope of practice. Pt's wife with additional questions regarding follow up with a different neurosurgeon at d/c, and pain medication availability/refill at d/c. Encouraged pt to discuss with RN and rounding physician. Pt currently with functional limitations due to the deficits listed below (see PT Problem List). Pt will benefit from skilled PT to increase their independence and safety with mobility to allow discharge to the venue listed below.          If plan is discharge home, recommend the following: A little help with walking and/or transfers;A little help with bathing/dressing/bathroom;Assistance with cooking/housework;Assist for transportation;Help with stairs or ramp for entrance   Can travel by private vehicle        Equipment Recommendations None recommended by PT  Recommendations for Other Services       Functional Status Assessment Patient has had a recent decline in their functional status and demonstrates the ability to make significant improvements in function in a reasonable and predictable amount of time.     Precautions / Restrictions Precautions Precautions: Back Precaution Booklet Issued: Yes (comment) Recall of  Precautions/Restrictions: Intact Precaution/Restrictions Comments: Reviewed precautions during functional mobility. Handout present in room Required Braces or Orthoses: Spinal Brace Spinal Brace: Lumbar corset;Applied in sitting position Restrictions Weight Bearing Restrictions Per Provider Order: No      Mobility  Bed Mobility Overal bed mobility: Needs Assistance Bed Mobility: Sidelying to Sit, Rolling, Sit to Sidelying Rolling: Min assist Sidelying to sit: Min assist     Sit to sidelying: Min assist General bed mobility comments: Assist for proper log roll. Use of rails required. HOB flat.    Transfers Overall transfer level: Needs assistance Equipment used: Rolling walker (2 wheels) Transfers: Sit to/from Stand Sit to Stand: Contact guard assist           General transfer comment: VC's for hand placement on seated surface. No assist required but hands on guarding provided for safety.    Ambulation/Gait Ambulation/Gait assistance: Contact guard assist Gait Distance (Feet): 120 Feet Assistive device: Rolling walker (2 wheels) Gait Pattern/deviations: Step-through pattern, Decreased stride length, Trunk flexed Gait velocity: Decreased Gait velocity interpretation: 1.31 - 2.62 ft/sec, indicative of limited community ambulator   General Gait Details: VC's for improved posture, closer walker proximity and forward gaze. No assist required but hands on guarding provided throughout for safety.  Stairs            Wheelchair Mobility     Tilt Bed    Modified Rankin (Stroke Patients Only)       Balance Overall balance assessment: Needs assistance Sitting-balance support: Feet supported Sitting balance-Leahy Scale: Fair     Standing balance support: Reliant on assistive device for balance Standing balance-Leahy Scale: Poor Standing balance comment: dynamically  Pertinent Vitals/Pain Pain Assessment Pain  Assessment: 0-10 Pain Score: 5  Pain Location: low back Pain Descriptors / Indicators: Sore Pain Intervention(s): Limited activity within patient's tolerance, Monitored during session, Repositioned    Home Living Family/patient expects to be discharged to:: Private residence Living Arrangements: Spouse/significant other Available Help at Discharge: Family;Available 24 hours/day Type of Home: House Home Access: Ramped entrance     Alternate Level Stairs-Number of Steps: flight Home Layout: Two level;Laundry or work area in [employer] Equipment: Agricultural consultant (2 wheels);Rollator (4 wheels);Adaptive equipment;Grab bars - tub/shower;Electric scooter      Prior Function Prior Level of Function : Independent/Modified Independent;Driving                     Extremity/Trunk Assessment   Upper Extremity Assessment Upper Extremity Assessment: Overall WFL for tasks assessed    Lower Extremity Assessment Lower Extremity Assessment: Overall WFL for tasks assessed    Cervical / Trunk Assessment Cervical / Trunk Assessment: Other exceptions Cervical / Trunk Exceptions: L spine fx.  Communication   Communication Communication: No apparent difficulties    Cognition Arousal: Alert Behavior During Therapy: WFL for tasks assessed/performed   PT - Cognitive impairments: No apparent impairments                         Following commands: Intact       Cueing Cueing Techniques: Verbal cues     General Comments      Exercises     Assessment/Plan    PT Assessment Patient needs continued PT services  PT Problem List Decreased strength;Decreased activity tolerance;Decreased balance;Decreased mobility;Decreased knowledge of use of DME;Decreased safety awareness;Decreased knowledge of precautions;Pain       PT Treatment Interventions DME instruction;Gait training;Functional mobility training;Therapeutic exercise;Therapeutic activities;Balance  training;Patient/family education    PT Goals (Current goals can be found in the Care Plan section)  Acute Rehab PT Goals Patient Stated Goal: Home tomorrow PT Goal Formulation: With patient/family Time For Goal Achievement: 09/09/23 Potential to Achieve Goals: Good    Frequency Min 2X/week     Co-evaluation               AM-PAC PT 6 Clicks Mobility  Outcome Measure Help needed turning from your back to your side while in a flat bed without using bedrails?: A Little Help needed moving from lying on your back to sitting on the side of a flat bed without using bedrails?: A Little Help needed moving to and from a bed to a chair (including a wheelchair)?: A Little Help needed standing up from a chair using your arms (e.g., wheelchair or bedside chair)?: A Little Help needed to walk in hospital room?: A Little Help needed climbing 3-5 steps with a railing? : A Little 6 Click Score: 18    End of Session Equipment Utilized During Treatment: Gait belt;Back brace Activity Tolerance: Patient tolerated treatment well Patient left: in bed;with call bell/phone within reach;with bed alarm set;with family/visitor present Nurse Communication: Mobility status PT Visit Diagnosis: Unsteadiness on feet (R26.81);Pain Pain - part of body:  (back)    Time: 8472-8379 PT Time Calculation (min) (ACUTE ONLY): 53 min   Charges:   PT Evaluation $PT Eval Moderate Complexity: 1 Mod PT Treatments $Gait Training: 23-37 mins $Self Care/Home Management: 8-22 PT General Charges $$ ACUTE PT VISIT: 1 Visit         Leita Sable, PT, DPT Acute Rehabilitation Services Secure Chat Preferred Office: [phone]  Leita JONETTA Sable 09/02/2023, 5:51 PM

## 2023-09-02 NOTE — ED Provider Notes (Signed)
  12:28 AM TLSO brace applied.  Attempted to stand and walk and became dizzy.  Wife is very nervous about his current state.  Will admit to medical team, plan for AM PT/OT eval.  Discussed with Dr. Perri-- will admit for ongoing care.   Jarold Olam HERO, PA-C 09/02/23 0130    Bari Charmaine FALCON, MD 09/05/23 [phone]

## 2023-09-02 NOTE — Progress Notes (Signed)
 IVT consult placed for replacement of previous IV. Patient is restricted to left arm due to wound on right side. No IV meds ordered except PRN which hasn't been needed since admission last night. Spoke with patient who would prefer to remain without IV unless absolutely needed. Secure chat sent to MD and primary RN requesting for patient to remain without access unless indicated. If needs change, new consult can be placed and IVT can re-assess. Awaiting response from MD.   Karsten Howry R Selam Pietsch, RN

## 2023-09-02 NOTE — ED Notes (Addendum)
 Placed brace on PT with Ortho tech.Pt c/o being dizzy when stood up. Family concerned about taking him home and not being able to get him up.Ortho tech notified physician.

## 2023-09-02 NOTE — Progress Notes (Addendum)
 Orthopedic Tech Progress Note Patient Details:  [name] Ray 10-01-1933 [id number]  Ortho Devices Type of Ortho Device: Lumbar corsett Ortho Device/Splint Interventions: Ordered, Application, Adjustment  I was called to fit the brace and see if the pt could walk. When standing the pt felt dizzy and it continued to get worse the longer they were standing. I told the rn and dr. I left the lso in the room and let the family know the brace was to stay with the pt. Post Interventions Patient Tolerated: Well Instructions Provided: Care of device, Adjustment of device  Chandra Dorn PARAS 09/02/2023, 12:45 AM

## 2023-09-02 NOTE — Evaluation (Signed)
 Occupational Therapy Evaluation Patient Details Name: [name] Ray MRN: [id number] DOB: [birth year] Today's Date: 09/02/2023   History of Present Illness   89 y.o. male adm 09/01/23 with medical history significant of CAD, prostate cancer, HTN, GERD, T2DM.  Mechanical fall with L3 vertebral fx.  Non operative with back brace.     Clinical Impressions Patient admitted for the diagnosis above.  PTA he lives at home with his spouse who is able to provide supportive assist.  Patient expresses minimal pain, and is not experiencing any dizziness with positional changes this date.  He is largely supervision with in room mobility/toileting at RW level, and light CGA with lower body dressing.  At baseline he uses a hip kit for dressing.  No significant OT needs exist in the acute setting, and OT will defer to mobility team and PT for continued OOB.  No post acute OT is anticipated.       If plan is discharge home, recommend the following:   Assist for transportation;Assistance with cooking/housework     Functional Status Assessment   Patient has had a recent decline in their functional status and demonstrates the ability to make significant improvements in function in a reasonable and predictable amount of time.     Equipment Recommendations   Tub/shower seat     Recommendations for Other Services         Precautions/Restrictions   Precautions Precautions: Back Precaution Booklet Issued: Yes (comment) Recall of Precautions/Restrictions: Intact Required Braces or Orthoses: Spinal Brace Spinal Brace: Lumbar corset;Applied in sitting position Restrictions Weight Bearing Restrictions Per Provider Order: No     Mobility Bed Mobility Overal bed mobility: Needs Assistance Bed Mobility: Sidelying to Sit   Sidelying to sit: Supervision            Transfers Overall transfer level: Needs assistance Equipment used: Rolling walker (2 wheels) Transfers: Sit to/from Stand,  Bed to chair/wheelchair/BSC Sit to Stand: Contact guard assist     Step pivot transfers: Supervision            Balance Overall balance assessment: Needs assistance Sitting-balance support: Feet supported Sitting balance-Leahy Scale: Good     Standing balance support: Reliant on assistive device for balance Standing balance-Leahy Scale: Fair                             ADL either performed or assessed with clinical judgement   ADL       Grooming: Supervision/safety;Standing               Lower Body Dressing: Sit to/from stand;Contact guard assist   Toilet Transfer: Supervision/safety;Regular Toilet;Ambulation;Rolling walker (2 wheels)                   Vision Baseline Vision/History: 1 Wears glasses Patient Visual Report: No change from baseline       Perception Perception: Not tested       Praxis Praxis: Not tested       Pertinent Vitals/Pain Pain Assessment Pain Assessment: Faces Faces Pain Scale: Hurts a little bit Pain Location: low back Pain Descriptors / Indicators: Sore Pain Intervention(s): Monitored during session     Extremity/Trunk Assessment Upper Extremity Assessment Upper Extremity Assessment: Overall WFL for tasks assessed   Lower Extremity Assessment Lower Extremity Assessment: Defer to PT evaluation   Cervical / Trunk Assessment Cervical / Trunk Assessment: Other exceptions Cervical / Trunk Exceptions: L spine fx.   Communication Communication  Communication: No apparent difficulties   Cognition Arousal: Alert Behavior During Therapy: WFL for tasks assessed/performed Cognition: No apparent impairments                               Following commands: Intact       Cueing  General Comments   Cueing Techniques: Verbal cues      Exercises     Shoulder Instructions      Home Living Family/patient expects to be discharged to:: Private residence Living Arrangements:  Spouse/significant other Available Help at Discharge: Family;Available 24 hours/day Type of Home: House Home Access: Ramped entrance     Home Layout: Two level;Laundry or work area in basement Alternate Teacher, music of Steps: flight Alternate Level Stairs-Rails: Right;Left Bathroom Shower/Tub: Producer, television/film/video: Handicapped height Bathroom Accessibility: Yes How Accessible: Accessible via walker Home Equipment: Agricultural consultant (2 wheels);Rollator (4 wheels);Adaptive equipment;Grab bars - tub/shower;Programmer, systems: Reacher;Sock aid;Long-handled Building services engineer        Prior Functioning/Environment Prior Level of Function : Independent/Modified Independent;Driving                    OT Problem List: Impaired balance (sitting and/or standing);Pain   OT Treatment/Interventions:        OT Goals(Current goals can be found in the care plan section)   Acute Rehab OT Goals Patient Stated Goal: Return home OT Goal Formulation: With patient Time For Goal Achievement: 09/09/23 Potential to Achieve Goals: Good   OT Frequency:       Co-evaluation              AM-PAC OT 6 Clicks Daily Activity     Outcome Measure Help from another person eating meals?: None Help from another person taking care of personal grooming?: A Little Help from another person toileting, which includes using toliet, bedpan, or urinal?: A Little Help from another person bathing (including washing, rinsing, drying)?: A Little Help from another person to put on and taking off regular upper body clothing?: None Help from another person to put on and taking off regular lower body clothing?: A Little 6 Click Score: 20   End of Session Equipment Utilized During Treatment: Rolling walker (2 wheels) Nurse Communication: Mobility status  Activity Tolerance: Patient tolerated treatment well Patient left: in chair;with call bell/phone within reach;with chair alarm  set  OT Visit Diagnosis: Pain Pain - part of body:  (back)                Time: 9152-9086 OT Time Calculation (min): 26 min Charges:  OT General Charges $OT Visit: 1 Visit OT Evaluation $OT Eval Moderate Complexity: 1 Mod OT Treatments $Self Care/Home Management : 8-22 mins  09/02/2023  RP, OTR/L  Acute Rehabilitation Services  Office:  [phone]   [name] Ray 09/02/2023, 9:19 AM

## 2023-09-02 NOTE — H&P (Addendum)
 History and Physical    JAMONT MELLIN FMW:[id number] DOB: November 09, 1933 DOA: 09/01/2023  PCP: Tisovec, Richard W, MD  Patient coming from: home  I have personally briefly reviewed patient's old medical records in [facility]  Chief Complaint: back pain  HPI: [name] is [name] Ray 89 y.o. male with medical history significant of CAD, prostate cancer, HTN, GERD, T2DM and multiple other medical issues here after [place] [employer] fall.  He was at [facility].  Was going to use the bathroom and tripped over his right toe.  His right side hit the ground.  His head did hit the ground, but had no LOC.  EMS was called and he was brought to the hospital.  Now has 6/10 pain in his lower back.  Sharp, nonradiating.  No numbness, tingling, weakness.    ED Course: labs, imaging.  Discussion with nsgy, trauma surgery.  Dizzy with standing.  Admission requested to hospitalist for further evaluation, therapy eval.    Review of Systems: As per HPI otherwise all other systems reviewed and are negative.  Past Medical History:  Diagnosis Date   Adenomatous polyp    history   Arthritis    knees , shoulders, hands    Atrial bigeminy    in the past   BPH (benign prostatic hyperplasia)    Carotid artery disease (HCC)    Jaeson Molstad. Doppler, January, 2012, 40-59% bilateral stenoses;  b.  Carotids 4/14:  40-59% bilat ICA, occluded R vertebral artery; f/u 1 year // Carotid US  07/2018:  Bilat ICA 1-39; R subclavian stenosis >> rpt in 1 year  // Carotid US  07/2019: Bilateral ICA 40-59; right subclavian stenosis; repeat 1 year // Carotid US  7/22: LICA 40-59, R subcl stenosis.//Carotid US  07/2021: R 1-39, L 40-59   Colon polyps    Coronary artery disease    acute inferior MI Jan 2002 with stent / Taxus stent LAD 2004 / Cypher stent prox LAD and cypher stent mid circ May 2006 / nuclear stress Dec 2009 normal EF 63% / nuclear August 2011 EF 62% / inferior thining and very mild ischemia inferior and apex / catherization Sept 2011 EF 60%  mild to  moderate diffuse RCA disease, moderate discrete LAD stenosis before the stent in the proximal LAD med tx rec   Diverticulosis    Dyslipidemia    Ejection fraction    Fatty liver    history   GERD (gastroesophageal reflux disease)    Hypertension    [facility] - 5 stents total , last cardiac cath. 2011.  Cleared for  surgery by Dr. Micky- 04/23/2013   IBS (irritable bowel syndrome)    Myocardial infarction (HCC) 2002   Myoview     Myoview  07/2018: EF 63, inferior artifact, no ischemia or scar; Low Risk   Numbness and tingling in hands    resolved   Overweight(278.02)    Prostate cancer (HCC) approx 2016   treated /w  Lupron injection x1, then radium   Shortness of breath dyspnea    Type II diabetes mellitus (HCC)     Past Surgical History:  Procedure Laterality Date   CARDIAC CATHETERIZATION     last cath- 2006   COLONOSCOPY     CORONARY ANGIOPLASTY WITH STENT PLACEMENT     multiple stents in LAD:  I've got 5 stents (05/24/2013)   EYE SURGERY Bilateral    cataract surgery with lens implants   LUMBAR LAMINECTOMY/DECOMPRESSION MICRODISCECTOMY N/Brennyn Haisley 09/11/2017   Procedure: LAMINECTOMY AND FORAMINOTOMY LUMBAR One-Two, Two-Three,  Three-Four, Four-Five, Five-Sacral One;  Surgeon: Mavis Purchase, MD;  Location: [facility] OR;  Service: Neurosurgery;  Laterality: N/Cezar Misiaszek;  LAMINECTOMY AND FORAMINOTOMY LUMBAR One-Two,Two-Three, Three-Four, Four-Five, Five-Sacral One   NEUROPLASTY / TRANSPOSITION MEDIAN NERVE AT CARPAL TUNNEL Left    also surgery on thumb, elbow- nerve release    PROSTATE BIOPSY     followed later by HDR   TOTAL HIP ARTHROPLASTY Left 01/26/2018   Procedure: TOTAL HIP ARTHROPLASTY ANTERIOR APPROACH;  Surgeon: Melodi Lerner, MD;  Location: [facility] ORS;  Service: Orthopedics;  Laterality: Left;    TOTAL KNEE ARTHROPLASTY Right 05/24/2013   TOTAL KNEE ARTHROPLASTY Right 05/24/2013   Procedure: RIGHT TOTAL KNEE ARTHROPLASTY;  Surgeon: Marcey Raman, MD;  Location: [facility] OR;  Service:  Orthopedics;  Laterality: Right;   TOTAL KNEE ARTHROPLASTY Left 02/14/2014   Procedure: LEFT TOTAL KNEE ARTHROPLASTY;  Surgeon: Marcey Raman, MD;  Location: [facility] OR;  Service: Orthopedics;  Laterality: Left;    Social History  reports that he has never smoked. He has never used smokeless tobacco. He reports that he does not drink alcohol  and does not use drugs.  Allergies  Allergen Reactions   Atorvastatin Other (See Comments)    SEVERE LEG PAIN     Rosuvastatin Other (See Comments)    JOINT PAIN       Morphine     Clindamycin Rash and Other (See Comments)    PATIENT REFUSES    Family History  Problem Relation Age of Onset   Hypertension Father    Heart disease Father    Heart disease Brother    Heart attack Paternal Grandfather    Prior to Admission medications   Medication Sig Start Date End Date Taking? Authorizing Provider  amoxicillin (AMOXIL) 500 MG tablet Take 2,000 mg by mouth See admin instructions. Prior to dental appointment   Yes [provider]  clopidogrel  (PLAVIX ) 75 MG tablet Take 75 mg by mouth daily.   Yes [provider]  Guaifenesin  1200 MG TB12 Take 1,200 mg by mouth daily as needed (cough).   Yes [provider]  JENTADUETO  XR 2.05-998 MG TB24 Take 1 tablet by mouth daily. 02/12/18  Yes [provider]  loperamide  (IMODIUM  Malley Hauter-D) 2 MG tablet Take 2 mg by mouth 4 (four) times daily as needed for diarrhea or loose stools.   Yes [provider]  Multiple Vitamin (MULTI-VITAMIN) tablet Take 1 tablet by mouth daily.   Yes [provider]  ramipril  (ALTACE ) 10 MG capsule Take 10 mg by mouth daily after breakfast.   Yes [provider]  simvastatin  (ZOCOR ) 20 MG tablet Take 20 mg by mouth at bedtime.   Yes [provider]  tamsulosin  (FLOMAX ) 0.4 MG CAPS capsule Take 0.4 mg by mouth at bedtime. 04/01/13  Yes [provider]  acetaminophen  (TYLENOL ) 500 MG tablet Take 500 mg by mouth every 6  (six) hours as needed for mild pain (pain score 1-3). Patient not taking: Reported on 09/02/2023    [provider]  gabapentin (NEURONTIN) 300 MG capsule Take 300 mg by mouth 2 (two) times daily. Patient not taking: Reported on 09/02/2023    [provider]  loratadine  (CLARITIN ) 10 MG tablet Take 10 mg by mouth daily as needed for allergies. Patient not taking: Reported on 09/02/2023    [provider]  nitroGLYCERIN  (NITROSTAT ) 0.4 MG SL tablet Place 1 tablet (0.4 mg total) under the tongue every 5 (five) minutes as needed for chest pain. Please keep upcoming appt.  Thank you Patient not taking: Reported on 09/02/2023 04/23/17   Wonda Sharper, MD    Physical Exam: Vitals:   09/01/23 2015 09/01/23 2030 09/01/23 2310 09/01/23 2319  BP: (!) 111/51 (!) 99/49 (!) 137/52   Pulse: (!) 57 (!) 59 68   Resp: 10 14 18    Temp:    98.3 F (36.8 C)  TempSrc:    Oral  SpO2: 98% 100% 100%   Weight:      Height:        Constitutional: NAD, calm, comfortable Vitals:   09/01/23 2015 09/01/23 2030 09/01/23 2310 09/01/23 2319  BP: (!) 111/51 (!) 99/49 (!) 137/52   Pulse: (!) 57 (!) 59 68   Resp: 10 14 18    Temp:    98.3 F (36.8 C)  TempSrc:    Oral  SpO2: 98% 100% 100%   Weight:      Height:       Eyes: PERRL ENMT: Mucous membranes are mois  Neck: normal, supple Respiratory: clear to auscultation bilaterally, no wheezing, no crackles. Normal respiratory effort. No accessory muscle use.  Cardiovascular: Regular rate and rhythm, no murmurs / rubs / gallops. No extremity edema. Abdomen: no tenderness, no masses palpated. Musculoskeletal: bruising to R knee - no visible injury to midline back  Skin: no rashes, lesions Neurologic: CN 2-12 grossly intact. Moving all extremities.  Weakness to lower extremities due to pain.  Psychiatric: Normal judgment and insight. Alert and oriented x 3. Normal mood.   Labs on Admission: I have personally reviewed following labs and  imaging studies  CBC: Recent Labs  Lab 09/01/23 1922 09/01/23 1928  WBC 7.7  --   HGB 10.8* 10.9*  HCT 34.4* 32.0*  MCV 98.3  --   PLT 136*  --     Basic Metabolic Panel: Recent Labs  Lab 09/01/23 1922 09/01/23 1928  NA 133* 132*  K 4.6 4.7  CL 98 99  CO2 21*  --   GLUCOSE 135* 131*  BUN 31* 31*  CREATININE 1.25* 1.30*  CALCIUM 9.0  --     GFR: Estimated Creatinine Clearance: 40.2 mL/min (Ronell Boldin) (by C-G formula based on SCr of 1.3 mg/dL (H)).  Liver Function Tests: Recent Labs  Lab 09/01/23 1922  AST 24  ALT 17  ALKPHOS 43  BILITOT 0.4  PROT 5.9*  ALBUMIN 3.7    Urine analysis:    Component Value Date/Time   COLORURINE YELLOW 07/03/2020 1953   APPEARANCEUR HAZY (Williom Cedar) 07/03/2020 1953   LABSPEC 1.028 07/03/2020 1953   PHURINE 5.0 07/03/2020 1953   GLUCOSEU NEGATIVE 07/03/2020 1953   HGBUR NEGATIVE 07/03/2020 1953   BILIRUBINUR negative 07/21/2020 1700   KETONESUR negative 07/21/2020 1700   KETONESUR NEGATIVE 07/03/2020 1953   PROTEINUR =30 (Jazmine Longshore) 07/21/2020 1700   PROTEINUR 30 (Pinki Rottman) 07/03/2020 1953   UROBILINOGEN 0.2 07/21/2020 1700   UROBILINOGEN 0.2 02/02/2014 1109   NITRITE Negative 07/21/2020 1700   NITRITE NEGATIVE 07/03/2020 1953   LEUKOCYTESUR Trace (Cortnee Steinmiller) 07/21/2020 1700   LEUKOCYTESUR NEGATIVE 07/03/2020 1953    Radiological Exams on Admission: CT CHEST ABDOMEN PELVIS W CONTRAST Addendum Date: 09/01/2023 ADDENDUM REPORT: 09/01/2023 23:25 ADDENDUM: These results were called to the ordering clinician or representative by the Radiologist Assistant, and communication documented in the PACS or [employer]. Electronically Signed   By: Morgane  Naveau M.D.   On: 09/01/2023 23:25   Result Date: 09/01/2023 CLINICAL DATA:  Polytrauma, blunt fx and retroperitoneal hematoma noted on spine imaging EXAM: CT  CHEST, ABDOMEN, AND PELVIS WITH CONTRAST TECHNIQUE: Multidetector CT imaging of the chest, abdomen and pelvis was performed following the standard protocol  during bolus administration of intravenous contrast. RADIATION DOSE REDUCTION: This exam was performed according to the departmental dose-optimization program which includes automated exposure control, adjustment of the mA and/or kV according to patient size and/or use of iterative reconstruction technique. CONTRAST:  75mL OMNIPAQUE  IOHEXOL  350 MG/ML SOLN COMPARISON:  CT thoracolumbar spine 09/01/2023, CT angio chest 07/04/2020, CT abdomen pelvis 04/21/2015 FINDINGS: CHEST: Cardiovascular: No aortic injury. The thoracic aorta is normal in caliber. The heart is normal in size. No significant pericardial effusion. Atherosclerotic plaque. Aortic valve leaflet calcification. Four-vessel coronary calcification. Mediastinum/Nodes: No pneumomediastinum. No mediastinal hematoma. The esophagus is unremarkable. The thyroid is unremarkable. The central airways are patent. No mediastinal, hilar, or axillary lymphadenopathy. Lungs/Pleura: Incidentally noted azygous fissure. No focal consolidation. No pulmonary nodule. No pulmonary mass. No pulmonary contusion or laceration. No pneumatocele formation. No pleural effusion. No pneumothorax. No hemothorax. Musculoskeletal/Chest wall: No chest wall mass. No acute rib or sternal fracture. Bilateral shoulder degenerative changes. Please see separately dictated CT thoracolumbar spine. ABDOMEN / PELVIS: Hepatobiliary: Not enlarged. No focal lesion. No laceration or subcapsular hematoma. The gallbladder is otherwise unremarkable with no radio-opaque gallstones. No biliary ductal dilatation. Pancreas: Normal pancreatic contour. No main pancreatic duct dilatation. Spleen: Not enlarged. No focal lesion. No laceration, subcapsular hematoma, or vascular injury. Adrenals/Urinary Tract: No nodularity bilaterally. Atrophic kidneys, right greater than left. Bilateral kidneys enhance symmetrically. No hydronephrosis. No contusion, laceration, or subcapsular hematoma. No injury to the vascular  structures or collecting systems. No hydroureter. The urinary bladder is unremarkable. Stomach/Bowel: No small or large bowel wall thickening or dilatation. Colonic diverticulosis. The appendix is unremarkable. Vasculature/Lymphatics: Flattened inferior vena cava with question wall thickening of the inferior vena cava on delayed view. No definite finding of extravasation of intravenous contrast with however markedly limited evaluation due to timing of contrast. No abdominal aorta or iliac aneurysm. No active contrast extravasation or pseudoaneurysm of the arterial vasculature. No abdominal, pelvic, inguinal lymphadenopathy. Reproductive: Unremarkable prostate. Other: Slightly increased in size 5.5 x 4.2 cm (from 5.4 x 3.7 cm) enlarged and heterogeneous right iliopsoas muscle with active extravasation (blooming noted on delayed view) into the psoas muscle from likely Yuliet Needs branch of the lumbar vasculature (3:78, 8:49). Stable retroperitoneal hematoma. No simple free fluid ascites. No pneumoperitoneum. No organized fluid collection. Musculoskeletal: No significant soft tissue hematoma. No acute pelvic fracture. Unstable L3 fracture please see separately dictated CT thoracolumbar spine. Total left hip arthroplasty. Other ports and devices: None. IMPRESSION: 1.  No acute intrathoracic traumatic injury. 2. Slightly increased in size 5.5 x 4.2 cm (from 5.4 x 3.7 cm) right iliopsoas intramuscular hematoma with active extravasation into the psoas muscle from likely Solange Emry branch of the lumbar vasculature. 3. Stable retroperitoneal hematoma. The inferior vena cava appears flattened with question of wall thickening which could be see in Oriah Leinweber posttraumatic inferior vena cava thrombosis. Findings suggestive of likely inferior vena cava injury with healing/thrombosis and no further active extravasation into the retroperitoneum. 4. Unstable L3 fracture. Please see separately dictated CT thoracolumbar spine 09/01/2023. 5.  Aortic  Atherosclerosis (ICD10-I70.0). 6. Atrophic kidneys. Electronically Signed: By: Morgane  Naveau M.D. On: 09/01/2023 23:12   CT CERVICAL SPINE WO CONTRAST Addendum Date: 09/01/2023 ADDENDUM REPORT: 09/01/2023 21:20 ADDENDUM: Unstable L3 distraction injury. Retroperitoneal hemorrhage and intramuscular right iliopsoas hemorrhage. These results were called by telephone at the time of interpretation on 09/01/2023 at 9:20  pm to provider JON KNAPP , who verbally acknowledged these results. Electronically Signed   By: Morgane  Naveau M.D.   On: 09/01/2023 21:20   Result Date: 09/01/2023 CLINICAL DATA:  Polytrauma, blunt; fall midline ttp; fall, midline ttp. Trauma level 2. Fall. Midline tenderness palpation thoracic lumbar spine. EXAM: CT CERVICAL, THORACIC, AND LUMBAR SPINE WITHOUT CONTRAST TECHNIQUE: Multidetector CT imaging of the cervical, thoracic and lumbar spine was performed without intravenous contrast. Multiplanar CT image reconstructions were also generated. RADIATION DOSE REDUCTION: This exam was performed according to the departmental dose-optimization program which includes automated exposure control, adjustment of the mA and/or kV according to patient size and/or use of iterative reconstruction technique. COMPARISON:  X-ray lumbar spine 09/11/2017 FINDINGS: CT CERVICAL SPINE FINDINGS Alignment: Normal. Skull base and vertebrae: Multilevel severe degenerative changes of the spine. Associated severe osseous neural foraminal stenosis at the C4-C5 levels. No severe osseous central canal stenosis. No acute fracture. No aggressive appearing focal osseous lesion or focal pathologic process. Soft tissues and spinal canal: No prevertebral fluid or swelling. No visible canal hematoma. Upper chest: Azygous fissure noted. Other: Atherosclerotic plaque of the carotid arteries within the neck. CT THORACIC SPINE FINDINGS Alignment: Normal. Vertebrae: Multilevel moderate degenerative changes of the spine. Chronic  appearing multilevel vertebral body height loss. No associated severe osseous foraminal or central canal stenosis. No acute fracture or focal pathologic process. Paraspinal and other soft tissues: Negative. Disc levels: Maintained. CT LUMBAR SPINE FINDINGS Segmentation: 5 lumbar type vertebrae. Alignment: Grade 1 anterolisthesis of L4 on L5 and L5 on S1. Vertebrae: Multilevel moderate severe degenerative changes of the spine. No severe osseous neural foraminal or central canal stenosis. Acute distraction injury with fracture through the L3 vertebral body (8:45, 7:20). Prior L3-L4 L5 laminectomy. Paraspinal and other soft tissues: Prevertebral hematoma. Disc levels: Maintained. Other: Right retroperitoneal hemorrhage as well as asymmetric and heterogeneous right iliopsoas muscle. Total left hip arthroplasty. Colonic diverticulosis with no acute diverticulitis. Atherosclerotic plaque. Poorly visualized inferior vena cava along the retroperitoneal hematoma. Stranding along the infrarenal abdominal aorta. IMPRESSION: 1. Acute distraction injury with fracture through the L3 vertebral body. Recommend MRI for further evaluation. 2. Associated right retroperitoneal hemorrhage is well as intramuscular right lobe iliopsoas hematoma. Poorly visualized inferior vena cava along the retroperitoneal hematoma with underlying injury not excluded. Stranding along the infrarenal abdominal aorta with limited evaluation. Recommend trauma CT chest abdomen pelvis for further evaluation. 3. No acute displaced fracture or traumatic listhesis of the cervical and thoracic spine. 4. Multilevel severe degenerative changes of the spine. Associated severe osseous neural foraminal stenosis at the C4-C5 levels. 5. Prior L3-L4 L5 laminectomy. Electronically Signed: By: Morgane  Naveau M.D. On: 09/01/2023 20:45   CT Thoracic Spine Wo Contrast Addendum Date: 09/01/2023 ADDENDUM REPORT: 09/01/2023 21:20 ADDENDUM: Unstable L3 distraction injury.  Retroperitoneal hemorrhage and intramuscular right iliopsoas hemorrhage. These results were called by telephone at the time of interpretation on 09/01/2023 at 9:20 pm to provider [facility] , who verbally acknowledged these results. Electronically Signed   By: Morgane  Naveau M.D.   On: 09/01/2023 21:20   Result Date: 09/01/2023 CLINICAL DATA:  Polytrauma, blunt; fall midline ttp; fall, midline ttp. Trauma level 2. Fall. Midline tenderness palpation thoracic lumbar spine. EXAM: CT CERVICAL, THORACIC, AND LUMBAR SPINE WITHOUT CONTRAST TECHNIQUE: Multidetector CT imaging of the cervical, thoracic and lumbar spine was performed without intravenous contrast. Multiplanar CT image reconstructions were also generated. RADIATION DOSE REDUCTION: This exam was performed according to the departmental dose-optimization program which includes  automated exposure control, adjustment of the mA and/or kV according to patient size and/or use of iterative reconstruction technique. COMPARISON:  X-ray lumbar spine 09/11/2017 FINDINGS: CT CERVICAL SPINE FINDINGS Alignment: Normal. Skull base and vertebrae: Multilevel severe degenerative changes of the spine. Associated severe osseous neural foraminal stenosis at the C4-C5 levels. No severe osseous central canal stenosis. No acute fracture. No aggressive appearing focal osseous lesion or focal pathologic process. Soft tissues and spinal canal: No prevertebral fluid or swelling. No visible canal hematoma. Upper chest: Azygous fissure noted. Other: Atherosclerotic plaque of the carotid arteries within the neck. CT THORACIC SPINE FINDINGS Alignment: Normal. Vertebrae: Multilevel moderate degenerative changes of the spine. Chronic appearing multilevel vertebral body height loss. No associated severe osseous foraminal or central canal stenosis. No acute fracture or focal pathologic process. Paraspinal and other soft tissues: Negative. Disc levels: Maintained. CT LUMBAR SPINE FINDINGS  Segmentation: 5 lumbar type vertebrae. Alignment: Grade 1 anterolisthesis of L4 on L5 and L5 on S1. Vertebrae: Multilevel moderate severe degenerative changes of the spine. No severe osseous neural foraminal or central canal stenosis. Acute distraction injury with fracture through the L3 vertebral body (8:45, 7:20). Prior L3-L4 L5 laminectomy. Paraspinal and other soft tissues: Prevertebral hematoma. Disc levels: Maintained. Other: Right retroperitoneal hemorrhage as well as asymmetric and heterogeneous right iliopsoas muscle. Total left hip arthroplasty. Colonic diverticulosis with no acute diverticulitis. Atherosclerotic plaque. Poorly visualized inferior vena cava along the retroperitoneal hematoma. Stranding along the infrarenal abdominal aorta. IMPRESSION: 1. Acute distraction injury with fracture through the L3 vertebral body. Recommend MRI for further evaluation. 2. Associated right retroperitoneal hemorrhage is well as intramuscular right lobe iliopsoas hematoma. Poorly visualized inferior vena cava along the retroperitoneal hematoma with underlying injury not excluded. Stranding along the infrarenal abdominal aorta with limited evaluation. Recommend trauma CT chest abdomen pelvis for further evaluation. 3. No acute displaced fracture or traumatic listhesis of the cervical and thoracic spine. 4. Multilevel severe degenerative changes of the spine. Associated severe osseous neural foraminal stenosis at the C4-C5 levels. 5. Prior L3-L4 L5 laminectomy. Electronically Signed: By: Morgane  Naveau M.D. On: 09/01/2023 20:45   CT Lumbar Spine Wo Contrast Addendum Date: 09/01/2023 ADDENDUM REPORT: 09/01/2023 21:20 ADDENDUM: Unstable L3 distraction injury. Retroperitoneal hemorrhage and intramuscular right iliopsoas hemorrhage. These results were called by telephone at the time of interpretation on 09/01/2023 at 9:20 pm to provider [facility] , who verbally acknowledged these results. Electronically Signed   By:  Morgane  Naveau M.D.   On: 09/01/2023 21:20   Result Date: 09/01/2023 CLINICAL DATA:  Polytrauma, blunt; fall midline ttp; fall, midline ttp. Trauma level 2. Fall. Midline tenderness palpation thoracic lumbar spine. EXAM: CT CERVICAL, THORACIC, AND LUMBAR SPINE WITHOUT CONTRAST TECHNIQUE: Multidetector CT imaging of the cervical, thoracic and lumbar spine was performed without intravenous contrast. Multiplanar CT image reconstructions were also generated. RADIATION DOSE REDUCTION: This exam was performed according to the departmental dose-optimization program which includes automated exposure control, adjustment of the mA and/or kV according to patient size and/or use of iterative reconstruction technique. COMPARISON:  X-ray lumbar spine 09/11/2017 FINDINGS: CT CERVICAL SPINE FINDINGS Alignment: Normal. Skull base and vertebrae: Multilevel severe degenerative changes of the spine. Associated severe osseous neural foraminal stenosis at the C4-C5 levels. No severe osseous central canal stenosis. No acute fracture. No aggressive appearing focal osseous lesion or focal pathologic process. Soft tissues and spinal canal: No prevertebral fluid or swelling. No visible canal hematoma. Upper chest: Azygous fissure noted. Other: Atherosclerotic plaque of the carotid arteries  within the neck. CT THORACIC SPINE FINDINGS Alignment: Normal. Vertebrae: Multilevel moderate degenerative changes of the spine. Chronic appearing multilevel vertebral body height loss. No associated severe osseous foraminal or central canal stenosis. No acute fracture or focal pathologic process. Paraspinal and other soft tissues: Negative. Disc levels: Maintained. CT LUMBAR SPINE FINDINGS Segmentation: 5 lumbar type vertebrae. Alignment: Grade 1 anterolisthesis of L4 on L5 and L5 on S1. Vertebrae: Multilevel moderate severe degenerative changes of the spine. No severe osseous neural foraminal or central canal stenosis. Acute distraction injury with  fracture through the L3 vertebral body (8:45, 7:20). Prior L3-L4 L5 laminectomy. Paraspinal and other soft tissues: Prevertebral hematoma. Disc levels: Maintained. Other: Right retroperitoneal hemorrhage as well as asymmetric and heterogeneous right iliopsoas muscle. Total left hip arthroplasty. Colonic diverticulosis with no acute diverticulitis. Atherosclerotic plaque. Poorly visualized inferior vena cava along the retroperitoneal hematoma. Stranding along the infrarenal abdominal aorta. IMPRESSION: 1. Acute distraction injury with fracture through the L3 vertebral body. Recommend MRI for further evaluation. 2. Associated right retroperitoneal hemorrhage is well as intramuscular right lobe iliopsoas hematoma. Poorly visualized inferior vena cava along the retroperitoneal hematoma with underlying injury not excluded. Stranding along the infrarenal abdominal aorta with limited evaluation. Recommend trauma CT chest abdomen pelvis for further evaluation. 3. No acute displaced fracture or traumatic listhesis of the cervical and thoracic spine. 4. Multilevel severe degenerative changes of the spine. Associated severe osseous neural foraminal stenosis at the C4-C5 levels. 5. Prior L3-L4 L5 laminectomy. Electronically Signed: By: Morgane  Naveau M.D. On: 09/01/2023 20:45   DG Elbow 2 Views Right Result Date: 09/01/2023 CLINICAL DATA:  fall EXAM: RIGHT ELBOW - 2 VIEW COMPARISON:  None Available. FINDINGS: There is no evidence of fracture, dislocation, or joint effusion. Degenerative changes of the elbow. Soft tissues are unremarkable. IMPRESSION: No acute displaced fracture or dislocation. Electronically Signed   By: Morgane  Naveau M.D.   On: 09/01/2023 20:58   DG Knee 2 Views Right Result Date: 09/01/2023 CLINICAL DATA:  fall EXAM: RIGHT KNEE - 1-2 VIEW COMPARISON:  None Available. FINDINGS: Total knee arthroplasty. 3 mm lucency along the inferior aspect of the tibial prosthesis. No evidence of fracture,  dislocation, or joint effusion. No evidence of arthropathy or other focal bone abnormality. Soft tissues are unremarkable. Vascular calcification. IMPRESSION: 1.  No acute displaced fracture or dislocation. 2. Total knee arthroplasty. 3 mm lucency along the inferior aspect of the tibial prosthesis. Recommend correlation with prior x-ray. Electronically Signed   By: Morgane  Naveau M.D.   On: 09/01/2023 20:57   CT HEAD WO CONTRAST Result Date: 09/01/2023 CLINICAL DATA:  Head trauma, moderate-severe. Trauma level 2. Fall. EXAM: CT HEAD WITHOUT CONTRAST TECHNIQUE: Contiguous axial images were obtained from the base of the skull through the vertex without intravenous contrast. RADIATION DOSE REDUCTION: This exam was performed according to the departmental dose-optimization program which includes automated exposure control, adjustment of the mA and/or kV according to patient size and/or use of iterative reconstruction technique. COMPARISON:  MRI head 03/30/2022 FINDINGS: Brain: Cerebral ventricle sizes are concordant with the degree of cerebral volume loss. Patchy and confluent areas of decreased attenuation are noted throughout the deep and periventricular white matter of the cerebral hemispheres bilaterally, compatible with chronic microvascular ischemic disease. No evidence of large-territorial acute infarction. No parenchymal hemorrhage. No mass lesion. No extra-axial collection. No mass effect or midline shift. No hydrocephalus. Basilar cisterns are patent. Vascular: No hyperdense vessel. Atherosclerotic calcifications are present within the cavernous internal carotid arteries. Skull: No  acute fracture or focal lesion. Sinuses/Orbits: Left maxillary sinus, bilateral ethmoid, bilateral frontal sinus mucosal thickening. Paranasal sinuses and mastoid air cells are clear. Bilateral lens replacement. Otherwise the orbits are unremarkable. Other: None. IMPRESSION: No acute intracranial abnormality. Electronically  Signed   By: Morgane  Naveau M.D.   On: 09/01/2023 20:30   DG Pelvis Portable Result Date: 09/01/2023 CLINICAL DATA:  Trauma EXAM: PORTABLE PELVIS 1-2 VIEWS COMPARISON:  X-ray pelvis 01/26/2018 FINDINGS: Total left hip arthroplasty. There is no evidence of pelvic fracture or diastasis. No pelvic bone lesions are seen. Vascular calcification. Radiation seeds noted overlying the mid pelvis likely prostate treatment. Degenerative changes of the lower lumbar spine. IMPRESSION: Negative for acute traumatic injury. Electronically Signed   By: Morgane  Naveau M.D.   On: 09/01/2023 19:58   DG Chest Port 1 View Result Date: 09/01/2023 CLINICAL DATA:  Trauma EXAM: PORTABLE CHEST 1 VIEW COMPARISON:  Chest x-ray 07/03/2020 FINDINGS: The heart and mediastinal contours are unchanged. Atherosclerotic plaque. Coronary artery stent. Azygous fissure again noted. No focal consolidation. No pulmonary edema. No pleural effusion. No pneumothorax. No acute osseous abnormality. IMPRESSION: No active disease. Electronically Signed   By: Morgane  Naveau M.D.   On: 09/01/2023 19:57    EKG: Independently reviewed. Sinus   Assessment/Plan Principal Problem:   L3 vertebral fracture (HCC)    Assessment and Plan:  Mechanical Fall  Unstable L3 Fracture  Right Iliopsoas Intramuscular Hematoma  Retroperitoenal Hematoma In the setting of mechanical fall Hold plavix   Active extravasation into R ilipsoas hematoma noted (discussed with trauma surgery who noted this should stop without intervention) Per EDP, nsgy recommended lumbar corset at all times when out of bed and outpatient follow up (note is pending) EDP discussed with trauma surgery who noted stable for discharge PT/OT CT mentioned to follow MRI, consider clarification with neurosurgery whether this is indicated  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  Please discuss with vascular in the morning (rads suggested doppler IVC study, I can't find  this)  Coronary Artery Disease Hold plavix  for now  Per review of cards note, last stent appears to have been 2006  Lightheadedness Follow orthostatics Trend Hb  PT/OT   3 mm lucency along inferior aspect of tibial prosthesis  Follow with orthopedics, I don't see prior knee films to use for comparison Would discuss with ortho in AM  Anemia Appears relatively stable Will trend in setting of above/hematomas  Thrombocytopenia Mild, will monitor  CKD IIIa  Hyponatremia Appears stable  Carotid Artery Disease Noted  HTN Ramipril   T2DM Holding linagliptin /metformin  SSI for now    DVT prophylaxis: SCD  Code Status:   full  Family Communication:  Son, wife at bedside  Disposition Plan:   Patient is from:  home  Anticipated DC to:  Pending therapy eval  Anticipated DC date:  Pending improvemetn  Anticipated DC barriers: Pending improvement in mobility  Consults called:  ED called nsgy and trauma surgery  Admission status:  obs   Severity of Illness: The appropriate patient status for this patient is OBSERVATION. Observation status is judged to be reasonable and necessary in order to provide the required intensity of service to ensure the patient's safety. The patient's presenting symptoms, physical exam findings, and initial radiographic and laboratory data in the context of their medical condition is felt to place them at decreased risk for further clinical deterioration. Furthermore, it is anticipated that the patient will be medically stable for discharge from the hospital within 2 midnights of  admission.     Meliton Monte MD [facility]  How to contact the [facility] Attending or Consulting provider 7A - 7P or covering provider during after hours 7P -7A, for this patient?   Check the care team in [facility] and look for Dakwan Pridgen) attending/consulting [facility] provider listed and b) the [facility] team listed Log into [email or web address] and use [place]'s universal password to access. If  you do not have the password, please contact the hospital operator. Locate the [facility] provider you are looking for under [facility] and page to Emmely Bittinger number that you can be directly reached. If you still have difficulty reaching the provider, please page the [facility] (Director on Call) for the Hospitalists listed on amion for assistance.  09/02/2023, 1:49 AM

## 2023-09-02 NOTE — Progress Notes (Signed)
   Providing Compassionate, Quality Care - Together    [name] Ray tripped and fell while shopping. He has a history of L2-3, L3-4, L4-5, and L5-S1 laminectomy by Dr. Mavis performed on 09/11/2017. He sustained an L3 vertebral body fracture from the fall. No surgical intervention is recommended at this time. Recommend lumbosacral corset at all times and outpatient follow up with Dr. Mavis. The patient can be mobilized with his brace.    Gerard Beck, DNP, AGNP-C Nurse Practitioner  [facility] [address], [place], [place] [address] P: [phone]    F: [phone]

## 2023-09-02 NOTE — Progress Notes (Signed)
   Patient seen and examined at bedside, patient admitted after midnight, please see earlier detailed admission note by A Meliton Perri Raddle., MD. Briefly, patient presented secondary to back pain after fall resulting in L3 fracture, multiple hematomas and evidence of post-traumatic IVC thrombosis. Trauma surgery and neurosurgery cleared patient for discharge. Vascular surgery recommendations pending. Pain management.  Subjective: Patient reports pain has improved from yesterday.  BP (!) 101/46 (BP Location: Right Arm)   Pulse 66   Temp 97.7 F (36.5 C) (Oral)   Resp 17   Ht 5' 6 (1.676 m)   Wt 85 kg   SpO2 97%   BMI 30.25 kg/m   General exam: Appears calm and comfortable Respiratory system: Clear to auscultation. Respiratory effort normal. Cardiovascular system: S1 & S2 heard, RRR. Gastrointestinal system: Abdomen is nondistended, soft and nontender. Normal bowel sounds heard. Central nervous system: Alert and oriented. No focal neurological deficits. Musculoskeletal: R>L LE edema. No calf tenderness Psychiatry: Judgement and insight appear normal. Mood & affect appropriate.   Brief assessment/Plan:  Fall L3 fracture Seen by trauma surgery and neurosurgery. No surgical management. Brace ordered for use when mobilizing. PT/OT ordered with recommendations pending. -Follow-up PT recommendations -Continue analgesics  Right iliopsoas intramuscular hematoma Retroperitoneal hematoma Complicated by Plavix  use, which was held. 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 -Vascular surgery consulted  CAD Lightheadedness Tibial prosthesis lucency Thrombocytopenia CKD stage IIIa Hypertension Carotid artery disease Diabetes mellitus Per H&P   Family communication: None at bedside DVT prophylaxis: SCDs Disposition: Likely discharge home tomorrow if pain manageable, vascular surgery recommendations and stable hemoglobin  [name] Lam, MD [facility] 09/02/2023, 1:24 PM

## 2023-09-02 NOTE — Consult Note (Addendum)
 Hospital Consult    Reason for Consult:  IVC thrombosis/compression Requesting Physician:  Dr. Elgin Lam MRN #:  [id number]  History of Present Illness: This is a 89 y.o. male who sustained a ground level fall yesterday while shopping. He presented to ED via EMS. He mostly reports lower back pain but overall pain now better controlled. Says his right arm and right leg are also sore and bruised because he fell on his right side. He explains that normally he has swelling in his right, more so then left lower leg. Feels right is a little more swollen after fall. Denies any pain in his legs, no numbness or tingling. On workup he was noted to have a retroperitoneal hematoma and right iliopsoas intramuscular hematoma on CT chest/Abd/ pelvis. This also shows flattened IVC concerning for inferior vena cava injury with questionable healing/thrombosis. no further active extravasation into the retroperitoneum. Vascular surgery was consulted for evaluation and management recommendations.   Past Medical History:  Diagnosis Date   Adenomatous polyp    history   Arthritis    knees , shoulders, hands    Atrial bigeminy    in the past   BPH (benign prostatic hyperplasia)    Carotid artery disease (HCC)    a. Doppler, January, 2012, 40-59% bilateral stenoses;  b.  Carotids 4/14:  40-59% bilat ICA, occluded R vertebral artery; f/u 1 year // Carotid US  07/2018:  Bilat ICA 1-39; R subclavian stenosis >> rpt in 1 year  // Carotid US  07/2019: Bilateral ICA 40-59; right subclavian stenosis; repeat 1 year // Carotid US  7/22: LICA 40-59, R subcl stenosis.//Carotid US  07/2021: R 1-39, L 40-59   Colon polyps    Coronary artery disease    acute inferior MI Jan 2002 with stent / Taxus stent LAD 2004 / Cypher stent prox LAD and cypher stent mid circ May 2006 / nuclear stress Dec 2009 normal EF 63% / nuclear August 2011 EF 62% / inferior thining and very mild ischemia inferior and apex / catherization Sept 2011 EF 60%  mild  to moderate diffuse RCA disease, moderate discrete LAD stenosis before the stent in the proximal LAD med tx rec   Diverticulosis    Dyslipidemia    Ejection fraction    Fatty liver    history   GERD (gastroesophageal reflux disease)    Hypertension    [facility] - 5 stents total , last cardiac cath. 2011.  Cleared for  surgery by Dr. Micky- 04/23/2013   IBS (irritable bowel syndrome)    Myocardial infarction (HCC) 2002   Myoview     Myoview  07/2018: EF 63, inferior artifact, no ischemia or scar; Low Risk   Numbness and tingling in hands    resolved   Overweight(278.02)    Prostate cancer (HCC) approx 2016   treated /w  Lupron injection x1, then radium   Shortness of breath dyspnea    Type II diabetes mellitus (HCC)     Past Surgical History:  Procedure Laterality Date   CARDIAC CATHETERIZATION     last cath- 2006   COLONOSCOPY     CORONARY ANGIOPLASTY WITH STENT PLACEMENT     multiple stents in LAD:  I've got 5 stents (05/24/2013)   EYE SURGERY Bilateral    cataract surgery with lens implants   LUMBAR LAMINECTOMY/DECOMPRESSION MICRODISCECTOMY N/A 09/11/2017   Procedure: LAMINECTOMY AND FORAMINOTOMY LUMBAR One-Two, Two-Three, Three-Four, Four-Five, Five-Sacral One;  Surgeon: Mavis Purchase, MD;  Location: [facility] OR;  Service: Neurosurgery;  Laterality: N/A;  LAMINECTOMY AND FORAMINOTOMY LUMBAR One-Two,Two-Three, Three-Four, Four-Five, Five-Sacral One   NEUROPLASTY / TRANSPOSITION MEDIAN NERVE AT CARPAL TUNNEL Left    also surgery on thumb, elbow- nerve release    PROSTATE BIOPSY     followed later by HDR   TOTAL HIP ARTHROPLASTY Left 01/26/2018   Procedure: TOTAL HIP ARTHROPLASTY ANTERIOR APPROACH;  Surgeon: Melodi Lerner, MD;  Location: [facility] ORS;  Service: Orthopedics;  Laterality: Left;    TOTAL KNEE ARTHROPLASTY Right 05/24/2013   TOTAL KNEE ARTHROPLASTY Right 05/24/2013   Procedure: RIGHT TOTAL KNEE ARTHROPLASTY;  Surgeon: Marcey Raman, MD;  Location: [facility] OR;  Service:  Orthopedics;  Laterality: Right;   TOTAL KNEE ARTHROPLASTY Left 02/14/2014   Procedure: LEFT TOTAL KNEE ARTHROPLASTY;  Surgeon: Marcey Raman, MD;  Location: [facility] OR;  Service: Orthopedics;  Laterality: Left;    Allergies  Allergen Reactions   Atorvastatin Other (See Comments)    SEVERE LEG PAIN     Rosuvastatin Other (See Comments)    JOINT PAIN       Morphine     Clindamycin Rash and Other (See Comments)    PATIENT REFUSES    Prior to Admission medications   Medication Sig Start Date End Date Taking? Authorizing Provider  amoxicillin (AMOXIL) 500 MG tablet Take 2,000 mg by mouth See admin instructions. Prior to dental appointment   Yes [provider]  clopidogrel  (PLAVIX ) 75 MG tablet Take 75 mg by mouth daily.   Yes [provider]  Guaifenesin  1200 MG TB12 Take 1,200 mg by mouth daily as needed (cough).   Yes [provider]  JENTADUETO  XR 2.05-998 MG TB24 Take 1 tablet by mouth daily. 02/12/18  Yes [provider]  loperamide  (IMODIUM  A-D) 2 MG tablet Take 2 mg by mouth 4 (four) times daily as needed for diarrhea or loose stools.   Yes [provider]  Multiple Vitamin (MULTI-VITAMIN) tablet Take 1 tablet by mouth daily.   Yes [provider]  ramipril  (ALTACE ) 10 MG capsule Take 10 mg by mouth daily after breakfast.   Yes [provider]  simvastatin  (ZOCOR ) 20 MG tablet Take 20 mg by mouth at bedtime.   Yes [provider]  tamsulosin  (FLOMAX ) 0.4 MG CAPS capsule Take 0.4 mg by mouth at bedtime. 04/01/13  Yes [provider]  acetaminophen  (TYLENOL ) 500 MG tablet Take 500 mg by mouth every 6 (six) hours as needed for mild pain (pain score 1-3). Patient not taking: Reported on 09/02/2023    [provider]  gabapentin (NEURONTIN) 300 MG capsule Take 300 mg by mouth 2 (two) times daily. Patient not taking: Reported on 09/02/2023    [provider]  loratadine  (CLARITIN ) 10 MG tablet Take  10 mg by mouth daily as needed for allergies. Patient not taking: Reported on 09/02/2023    [provider]  nitroGLYCERIN  (NITROSTAT ) 0.4 MG SL tablet Place 1 tablet (0.4 mg total) under the tongue every 5 (five) minutes as needed for chest pain. Please keep upcoming appt. Thank you Patient not taking: Reported on 09/02/2023 04/23/17   Wonda Sharper, MD    Social History   Socioeconomic History   Marital status: Married    Spouse name: Not on file   Number of children: Not on file   Years of education: Not on file   Highest education level: Not on file  Occupational History   Not on file  Tobacco Use   Smoking status: Never   Smokeless tobacco: Never  Vaping  Use   Vaping status: Never Used  Substance and Sexual Activity   Alcohol  use: No   Drug use: No   Sexual activity: Not Currently  Other Topics Concern   Not on file  Social History Narrative   Not on file   Social Drivers of Health   Financial Resource Strain: Not on file  Food Insecurity: Not on file  Transportation Needs: Not on file  Physical Activity: Not on file  Stress: Not on file  Social Connections: Not on file  Intimate Partner Violence: Not on file     Family History  Problem Relation Age of Onset   Hypertension Father    Heart disease Father    Heart disease Brother    Heart attack Paternal Grandfather     ROS: Otherwise negative unless mentioned in HPI  Physical Examination  Vitals:   09/02/23 0553 09/02/23 0913  BP: (!) 129/50 (!) 101/46  Pulse: (!) 56 66  Resp: 16 17  Temp: 97.7 F (36.5 C) 97.7 F (36.5 C)  SpO2: 98% 97%   Body mass index is 30.25 kg/m.  General:  WDWN in NAD; sitting up in chair with lumbosacral corset in place Gait: Not observed HENT: WNL, normocephalic Pulmonary: normal non-labored breathing Cardiac: regular Abdomen: soft Vascular Exam/Pulses: 2+ radial pulses, right arm dressed Extremities: without ischemic changes, without Gangrene , without  cellulitis; without open wounds; right leg edema > left, ecchymosis present about right knee Musculoskeletal: no muscle wasting or atrophy  Neurologic: A&O X 3;  No focal weakness or paresthesias are detected; speech is fluent/normal Psychiatric:  The pt has Normal affect. Lymph:  Unremarkable  CBC    Component Value Date/Time   WBC 7.5 09/02/2023 0659   RBC 3.25 (L) 09/02/2023 0659   HGB 10.0 (L) 09/02/2023 0659   HGB 11.1 (L) 07/14/2018 1101   HCT 30.5 (L) 09/02/2023 0659   HCT 34.8 (L) 07/14/2018 1101   PLT 125 (L) 09/02/2023 0659   PLT 185 07/14/2018 1101   MCV 93.8 09/02/2023 0659   MCV 91 07/14/2018 1101   MCH 30.8 09/02/2023 0659   MCHC 32.8 09/02/2023 0659   RDW 13.2 09/02/2023 0659   RDW 14.4 07/14/2018 1101   LYMPHSABS 0.7 07/06/2020 0248   MONOABS 0.3 07/06/2020 0248   EOSABS 0.0 07/06/2020 0248   BASOSABS 0.0 07/06/2020 0248    BMET    Component Value Date/Time   NA 132 (L) 09/02/2023 0659   NA 139 07/22/2018 1100   K 4.7 09/02/2023 0659   CL 102 09/02/2023 0659   CO2 21 (L) 09/02/2023 0659   GLUCOSE 96 09/02/2023 0659   BUN 28 (H) 09/02/2023 0659   BUN 26 07/22/2018 1100   CREATININE 1.14 09/02/2023 0659   CALCIUM 8.6 (L) 09/02/2023 0659   GFRNONAA >60 09/02/2023 0659   GFRAA 74 07/22/2018 1100    COAGS: Lab Results  Component Value Date   INR 1.1 09/01/2023   INR 1.00 01/21/2018   INR 1.01 02/02/2014     Non-Invasive Vascular Imaging:   CT Chest Abdomen Pelvis w contrast: IMPRESSION: 1.  No acute intrathoracic traumatic injury. 2. Slightly increased in size 5.5 x 4.2 cm (from 5.4 x 3.7 cm) right iliopsoas intramuscular hematoma with active extravasation into the psoas muscle from likely a branch of the lumbar vasculature. 3. Stable retroperitoneal hematoma. The inferior vena cava appears flattened with question of wall thickening which could be see in a posttraumatic inferior vena cava thrombosis. Findings suggestive of  likely inferior  vena cava injury with healing/thrombosis and no further active extravasation into the retroperitoneum. 4. Unstable L3 fracture. Please see separately dictated CT thoracolumbar spine 09/01/2023. 5.  Aortic Atherosclerosis (ICD10-I70.0). 6. Atrophic kidneys.  Statin:  Yes.   Beta Blocker:  No. Aspirin :  No. ACEI:  Yes.   ARB:  No. CCB use:  No Other antiplatelets/anticoagulants:  Yes.   Plavix    ASSESSMENT/PLAN: This is a 89 y.o. male who presents after ground level fall with concerns of IVC injury/ compression. CT showing inferior vena cava appears flattened suggestive of likely inferior vena cava injury with healing/thrombosis. IVC appears compressed on CT likely due to the RP bleed. No extravasation seen. Patient on Plavix  for CAD. Would hold any anticoagulation at this time in setting of RP and intramuscular hematoma. Would recommend trending Hgb to make sure no signs of bleeding. He does not have any significant BLE swelling outside of his baseline edema.  No indication for intervention at this time. The on call vascular surgeon, Dr. Sheree will see patient later this afternoon to provide further recommendations   Teretha Damme PA-C [facility] [phone] 09/02/2023  11:12 AM  I have independently interviewed and examined patient and agree with PA assessment and plan above.  Mild swelling of the right greater than left lower extremity which is stable having previous bilateral knee replacements.  Abdomen is very soft he only complains of mild back tenderness.  Appears to have retroperitoneal hematoma compressing the IVC I do not think merits any intervention at this time.  As long as his H&H remained stable he should be okay for discharge and can see me on an as-needed basis.  Azell Bill C. Sheree, MD [facility] [place] Office: [phone] Pager: [phone]

## 2023-09-02 NOTE — Plan of Care (Signed)

## 2023-09-02 NOTE — Consult Note (Addendum)
 WOC Nurse Consult Note: Consult requested for right arm. Performed remotely after review of progress notes and photos in the EMR.  Right arm with significant areas of full thickness skin loss related to abrasion/skin tears from a fall. Loose flaps of skin at edges and dark red-purple wound bed and also bright red areas with small amt bloody drainage and areas of bruising.   Dressing procedure/placement/frequency: Topical treatment provided for bedside nurses to perfrom as follows to protect dressing from adhering and decrease discomfort with dressing changes: Change right arm dressing Q day as follows:  1. Apply Mepitel nonadherent dressing Soila # [phone]).  Leave this in place over the skin tears and change it every Tues and Sat.  2. Every day, apply a double-folded Xeroform gauze over the Mepitel layer, then cover with ABD pad and kerlex.  Moisten previous Xeroform gauze with NS to remove if it is adhered.   Please re-consult if further assistance is needed.  Thank-you,  Stephane Fought MSN, RN, CWOCN, CWCN-AP, CNS Contact Mon-Fri 0700-1500: [phone]

## 2023-09-02 NOTE — ED Notes (Signed)
 Ortho tech called to place brace on PT

## 2023-09-02 NOTE — Plan of Care (Signed)
 Pain controlled with current pain regimen.  Brace on when OOB. Worked with PT today. Skin tear R arm cleansed and dressed with xeroform, gauze, kerlix and ace.  WOCN consult placed.  Bruising and edema on R knee.  Problem: Education: Goal: Ability to describe self-care measures that may prevent or decrease complications (Diabetes Survival Skills Education) will improve Outcome: Progressing Goal: Individualized Educational Video(s) Outcome: Progressing   Problem: Coping: Goal: Ability to adjust to condition or change in health will improve Outcome: Progressing   Problem: Fluid Volume: Goal: Ability to maintain a balanced intake and output will improve Outcome: Progressing   Problem: Health Behavior/Discharge Planning: Goal: Ability to identify and utilize available resources and services will improve Outcome: Progressing Goal: Ability to manage health-related needs will improve Outcome: Progressing

## 2023-09-03 ENCOUNTER — Other Ambulatory Visit (HOSPITAL_COMMUNITY): Payer: Self-pay

## 2023-09-03 DIAGNOSIS — S32039A Unspecified fracture of third lumbar vertebra, initial encounter for closed fracture: Secondary | ICD-10-CM | POA: Diagnosis not present

## 2023-09-03 LAB — GLUCOSE, CAPILLARY
Glucose-Capillary: 142 mg/dL — ABNORMAL HIGH (ref 70–99)
Glucose-Capillary: 142 mg/dL — ABNORMAL HIGH (ref 70–99)

## 2023-09-03 LAB — CBC
HCT: 26.4 % — ABNORMAL LOW (ref 39.0–52.0)
Hemoglobin: 8.7 g/dL — ABNORMAL LOW (ref 13.0–17.0)
MCH: 31.1 pg (ref 26.0–34.0)
MCHC: 33 g/dL (ref 30.0–36.0)
MCV: 94.3 fL (ref 80.0–100.0)
Platelets: 120 K/uL — ABNORMAL LOW (ref 150–400)
RBC: 2.8 MIL/uL — ABNORMAL LOW (ref 4.22–5.81)
RDW: 13.6 % (ref 11.5–15.5)
WBC: 5.9 K/uL (ref 4.0–10.5)
nRBC: 0 % (ref 0.0–0.2)

## 2023-09-03 MED ORDER — OXYCODONE HCL 5 MG PO TABS
2.5000 mg | ORAL_TABLET | ORAL | 0 refills | Status: DC | PRN
  Filled 2023-09-03: qty 8, 3d supply, fill #0

## 2023-09-03 NOTE — Progress Notes (Signed)
 Awaiting for few things before d/c  and prionting AVS.

## 2023-09-03 NOTE — Progress Notes (Signed)
 RN explained AVS, highlighted the to do list.   Wound care education provided and demonstrated, multiple questions answered. Pt's wife making a list of what supplies to order. RN provided patient with several days  of supplies.  Patient's wife is very anxious about the dressing change, RN asked if they want to change the Recommendations, Patient and wife declined, and wife stated she will do the wound care.

## 2023-09-03 NOTE — Care Management Obs Status (Signed)
 MEDICARE OBSERVATION STATUS NOTIFICATION   Patient Details  Name: [name] Ray MRN: [id number] Date of Birth: 1933/10/15   Medicare Observation Status Notification Given:  Yes    Jon Cruel 09/03/2023, 9:28 AM

## 2023-09-03 NOTE — Discharge Summary (Signed)
 Physician Discharge Summary   Patient: [name] Ray MRN: [id number] DOB: 1933/06/20  Admit date:     09/01/2023  Discharge date: 09/03/23  Discharge Physician: Lonni SHAUNNA Dalton   PCP: Tisovec, Richard W, MD     Recommendations at discharge:  Follow up with PCP Dr. Tisovec for fall and retroperitoneal hematoma Dr. Tisovec: Please check CBC next Monday (discharge Hgb 8.7) Please resume ramipril  at appropriate time Please resume Plavix  if long-term use benefit outweighs risk in your opinion  Follow up with Dr. Mavis Neurosurgery for L3 fracture     Discharge Diagnoses: Principal Problem:   L3 vertebral fracture [facility]) Other hospital problems   Retroperitoneal hematoma   Right iliopsoas hematoma   Fall   Coronary artery disease   Anemia   CKD IIIa   Carotid artery disease   Hypertension   Diabetes    Hyponatremia      Hospital Course: 89 y.o. M with obesity, CAD, CKD IIIa baseline 1.3, who presented after a fall at the store.    Had a mechanical fall, landed on his right side.  In the ER, radiography showed L3 body fracture, an RP hematoma, and ?IVC compression.  Admitted for further evaluation.    Fall and trauma Admitted and case discussed with Trauma service who recommended nonoperative management, PT eval.  Worked with PT and did well.  See item by item below.    L3 vertebral body fracture Radiology reported unstable fracture.  This was reviewed by Neurosurgery Dr. Mavis who recommended nonoperative management, brace at all times (which was applied) and outpatient follow up.  Retroperitoneal hematoma Iliopsoas hematoma Acute anemia Admitted and Plavix  stopped.    Hgb trended down, but patient asymptomatic, working well with PT.  No dizziness with standing, no abdominal pain. Appetite good. These hematomas are in confined space and I suspect Hgb will stabilize without needing transfusion.  Plavix  stopped.  Defer to PCP if or when to  resume.    IVC abnormality CT abdomen appeared to show IVC compression or thrombosis.  Vascular surgery were consulted, described that this was likely impingement of the RP hematoma, and recommended nonoperative management of this likely self-limited condition.  They felt no Vascular surgery follow up was needed.  Would have low threshold to ultrasound for DVT if new leg swelling.           [place]  Controlled Substances Registry was reviewed for this patient prior to discharge.   Consultants: Neurosurgery, Dr. Mavis   Disposition: Home health Diet recommendation:  Cardiac diet  DISCHARGE MEDICATION: Allergies as of 09/03/2023       Reactions   Atorvastatin Other (See Comments)   SEVERE LEG PAIN   Rosuvastatin Other (See Comments)   JOINT PAIN    Morphine     Clindamycin Rash, Other (See Comments)   PATIENT REFUSES        Medication List     PAUSE taking these medications    clopidogrel  75 MG tablet Wait to take this until your doctor or other care provider tells you to start again. Commonly known as: PLAVIX  Take 75 mg by mouth daily.   ramipril  10 MG capsule Wait to take this until your doctor or other care provider tells you to start again. Commonly known as: ALTACE  Take 10 mg by mouth daily after breakfast.       STOP taking these medications    gabapentin 300 MG capsule Commonly known as: NEURONTIN   Guaifenesin  1200 MG Tb12  TAKE these medications    acetaminophen  500 MG tablet Commonly known as: TYLENOL  Take 500 mg by mouth every 6 (six) hours as needed for mild pain (pain score 1-3).   amoxicillin 500 MG tablet Commonly known as: AMOXIL Take 2,000 mg by mouth See admin instructions. Prior to dental appointment   Jentadueto  XR 2.05-998 MG Tb24 Generic drug: linaGLIPtin -metFORMIN  HCl ER Take 1 tablet by mouth daily.   loperamide  2 MG tablet Commonly known as: IMODIUM  A-D Take 2 mg by mouth 4 (four) times daily as  needed for diarrhea or loose stools.   loratadine  10 MG tablet Commonly known as: CLARITIN  Take 10 mg by mouth daily as needed for allergies.   Multi-Vitamin tablet Take 1 tablet by mouth daily.   nitroGLYCERIN  0.4 MG SL tablet Commonly known as: NITROSTAT  Place 1 tablet (0.4 mg total) under the tongue every 5 (five) minutes as needed for chest pain. Please keep upcoming appt. Thank you   oxyCODONE  5 MG immediate release tablet Commonly known as: Oxy IR/ROXICODONE  Take 0.5 tablets (2.5 mg total) by mouth every 4 (four) hours as needed for moderate pain (pain score 4-6).   simvastatin  20 MG tablet Commonly known as: ZOCOR  Take 20 mg by mouth at bedtime.   tamsulosin  0.4 MG Caps capsule Commonly known as: FLOMAX  Take 0.4 mg by mouth at bedtime.               Discharge Care Instructions  (From admission, onward)           Start     Ordered   09/03/23 0000  Discharge wound care:       Comments: Apply Mepitel nonadherent dressing over the wound.   Exchange the Mepitel every Tuesday and Saturday.  Every day, apply a new double folded Xeroform gauze over the Mepitel layer, then cover with ABD pad and then wrap that in Kerlex. Cover with an ACE wrap to secure. If the Xeroform is stuck to the wound, apply some clean water  to moisten it and loosen it   09/03/23 1047            Follow-up Information     Tisovec, Charlie ORN, MD .   Specialty: Internal Medicine Contact information: [address] [place] [address] [phone]         Mavis Purchase, MD Follow up.   Specialty: Neurosurgery Contact information: [address] [place] [address] [phone]         [facility] Follow up.   Contact information: phone [phone]  fax [phone]  get [email or web address]                Discharge Instructions     Discharge instructions   Complete by: As directed    **IMPORTANT DISCHARGE INSTRUCTIONS**   From  Dr. Jonel: You were admitted for a fall  You were found to have some internal bleeding (called a retroperitoneal hematoma based on where it is located in the abdomen) and a fracture of a lumbar vertebra.  The internal bleeding is stable.  Based on where it is, we know that this type of bleeding is in a confined space and will stop itself.  There is also a small spot where the internal bleeding is pushing on a blood vessel in your abdomen, but our blood vessel surgeon reviewed this and recommends to do nothing, as it will likely resolve by itself.   Do NOT take your clopidogrel /Plavix  Go see  Dr. Tisovec or his partner in 1 week Schedule an extra appoinrtment Have them check your blood level next Monday   For the lumbar spine fracture Take oxycodone  2.5 mg up to three times daily for pain You may also take acetaminophen  1000 mg (two tabs) three times daily for pain Acetaminophen  will make the oxycodone  work better  [employer] [facility] AT ALL TIMES and call Dr. Devon office to schedule follow up  For your right arm: See wound care instructions below   RETURN TO THE HOSPITAL if you have dizziness, pass out or feel like you might pass out with standing, or have new severe abdominal pain   IMPORTANT: for now, also do NOT take your ramipril , until you see Dr. Tisovec and he clears you to resume it   Discharge wound care:   Complete by: As directed    Apply Mepitel nonadherent dressing over the wound.   Exchange the Mepitel every Tuesday and Saturday.  Every day, apply a new double folded Xeroform gauze over the Mepitel layer, then cover with ABD pad and then wrap that in Kerlex. Cover with an ACE wrap to secure. If the Xeroform is stuck to the wound, apply some clean water  to moisten it and loosen it   Increase activity slowly   Complete by: As directed        Discharge Exam: Filed Weights   09/01/23 1931  Weight: 85 kg    General: Adult male, sitting up in bed, no  acute distress, interactive and pleasant Cardiovascular: RRR no murmurs, no LE edema, no JVD visible Respiratory: Normal respiratory effort, lungs clear Abdominal: Abdomen soft, no TTP , no guarding.   Neuro/Psych: Strength symmetric in upper and lower extremities bilaterally.  Judgment and insight normal.  Gait slow with walker but steady.      Condition at discharge: good  The results of significant diagnostics from this hospitalization (including imaging, microbiology, ancillary and laboratory) are listed below for reference.   Imaging Studies: CT CHEST ABDOMEN PELVIS W CONTRAST Addendum Date: 09/01/2023 ADDENDUM REPORT: 09/01/2023 23:25 ADDENDUM: These results were called to the ordering clinician or representative by the Radiologist Assistant, and communication documented in the PACS or [employer]. Electronically Signed   By: Morgane  Naveau M.D.   On: 09/01/2023 23:25   Result Date: 09/01/2023 CLINICAL DATA:  Polytrauma, blunt fx and retroperitoneal hematoma noted on spine imaging EXAM: CT CHEST, ABDOMEN, AND PELVIS WITH CONTRAST TECHNIQUE: Multidetector CT imaging of the chest, abdomen and pelvis was performed following the standard protocol during bolus administration of intravenous contrast. RADIATION DOSE REDUCTION: This exam was performed according to the departmental dose-optimization program which includes automated exposure control, adjustment of the mA and/or kV according to patient size and/or use of iterative reconstruction technique. CONTRAST:  75mL OMNIPAQUE  IOHEXOL  350 MG/ML SOLN COMPARISON:  CT thoracolumbar spine 09/01/2023, CT angio chest 07/04/2020, CT abdomen pelvis 04/21/2015 FINDINGS: CHEST: Cardiovascular: No aortic injury. The thoracic aorta is normal in caliber. The heart is normal in size. No significant pericardial effusion. Atherosclerotic plaque. Aortic valve leaflet calcification. Four-vessel coronary calcification. Mediastinum/Nodes: No pneumomediastinum. No  mediastinal hematoma. The esophagus is unremarkable. The thyroid is unremarkable. The central airways are patent. No mediastinal, hilar, or axillary lymphadenopathy. Lungs/Pleura: Incidentally noted azygous fissure. No focal consolidation. No pulmonary nodule. No pulmonary mass. No pulmonary contusion or laceration. No pneumatocele formation. No pleural effusion. No pneumothorax. No hemothorax. Musculoskeletal/Chest wall: No chest wall mass. No acute rib or sternal fracture. Bilateral shoulder degenerative changes.  Please see separately dictated CT thoracolumbar spine. ABDOMEN / PELVIS: Hepatobiliary: Not enlarged. No focal lesion. No laceration or subcapsular hematoma. The gallbladder is otherwise unremarkable with no radio-opaque gallstones. No biliary ductal dilatation. Pancreas: Normal pancreatic contour. No main pancreatic duct dilatation. Spleen: Not enlarged. No focal lesion. No laceration, subcapsular hematoma, or vascular injury. Adrenals/Urinary Tract: No nodularity bilaterally. Atrophic kidneys, right greater than left. Bilateral kidneys enhance symmetrically. No hydronephrosis. No contusion, laceration, or subcapsular hematoma. No injury to the vascular structures or collecting systems. No hydroureter. The urinary bladder is unremarkable. Stomach/Bowel: No small or large bowel wall thickening or dilatation. Colonic diverticulosis. The appendix is unremarkable. Vasculature/Lymphatics: Flattened inferior vena cava with question wall thickening of the inferior vena cava on delayed view. No definite finding of extravasation of intravenous contrast with however markedly limited evaluation due to timing of contrast. No abdominal aorta or iliac aneurysm. No active contrast extravasation or pseudoaneurysm of the arterial vasculature. No abdominal, pelvic, inguinal lymphadenopathy. Reproductive: Unremarkable prostate. Other: Slightly increased in size 5.5 x 4.2 cm (from 5.4 x 3.7 cm) enlarged and heterogeneous  right iliopsoas muscle with active extravasation (blooming noted on delayed view) into the psoas muscle from likely a branch of the lumbar vasculature (3:78, 8:49). Stable retroperitoneal hematoma. No simple free fluid ascites. No pneumoperitoneum. No organized fluid collection. Musculoskeletal: No significant soft tissue hematoma. No acute pelvic fracture. Unstable L3 fracture please see separately dictated CT thoracolumbar spine. Total left hip arthroplasty. Other ports and devices: None. IMPRESSION: 1.  No acute intrathoracic traumatic injury. 2. Slightly increased in size 5.5 x 4.2 cm (from 5.4 x 3.7 cm) right iliopsoas intramuscular hematoma with active extravasation into the psoas muscle from likely a branch of the lumbar vasculature. 3. Stable retroperitoneal hematoma. The inferior vena cava appears flattened with question of wall thickening which could be see in a posttraumatic inferior vena cava thrombosis. Findings suggestive of likely inferior vena cava injury with healing/thrombosis and no further active extravasation into the retroperitoneum. 4. Unstable L3 fracture. Please see separately dictated CT thoracolumbar spine 09/01/2023. 5.  Aortic Atherosclerosis (ICD10-I70.0). 6. Atrophic kidneys. Electronically Signed: By: Morgane  Naveau M.D. On: 09/01/2023 23:12   CT CERVICAL SPINE WO CONTRAST Addendum Date: 09/01/2023 ADDENDUM REPORT: 09/01/2023 21:20 ADDENDUM: Unstable L3 distraction injury. Retroperitoneal hemorrhage and intramuscular right iliopsoas hemorrhage. These results were called by telephone at the time of interpretation on 09/01/2023 at 9:20 pm to provider [facility] , who verbally acknowledged these results. Electronically Signed   By: Morgane  Naveau M.D.   On: 09/01/2023 21:20   Result Date: 09/01/2023 CLINICAL DATA:  Polytrauma, blunt; fall midline ttp; fall, midline ttp. Trauma level 2. Fall. Midline tenderness palpation thoracic lumbar spine. EXAM: CT CERVICAL, THORACIC, AND LUMBAR  SPINE WITHOUT CONTRAST TECHNIQUE: Multidetector CT imaging of the cervical, thoracic and lumbar spine was performed without intravenous contrast. Multiplanar CT image reconstructions were also generated. RADIATION DOSE REDUCTION: This exam was performed according to the departmental dose-optimization program which includes automated exposure control, adjustment of the mA and/or kV according to patient size and/or use of iterative reconstruction technique. COMPARISON:  X-ray lumbar spine 09/11/2017 FINDINGS: CT CERVICAL SPINE FINDINGS Alignment: Normal. Skull base and vertebrae: Multilevel severe degenerative changes of the spine. Associated severe osseous neural foraminal stenosis at the C4-C5 levels. No severe osseous central canal stenosis. No acute fracture. No aggressive appearing focal osseous lesion or focal pathologic process. Soft tissues and spinal canal: No prevertebral fluid or swelling. No visible canal hematoma. Upper chest: Azygous  fissure noted. Other: Atherosclerotic plaque of the carotid arteries within the neck. CT THORACIC SPINE FINDINGS Alignment: Normal. Vertebrae: Multilevel moderate degenerative changes of the spine. Chronic appearing multilevel vertebral body height loss. No associated severe osseous foraminal or central canal stenosis. No acute fracture or focal pathologic process. Paraspinal and other soft tissues: Negative. Disc levels: Maintained. CT LUMBAR SPINE FINDINGS Segmentation: 5 lumbar type vertebrae. Alignment: Grade 1 anterolisthesis of L4 on L5 and L5 on S1. Vertebrae: Multilevel moderate severe degenerative changes of the spine. No severe osseous neural foraminal or central canal stenosis. Acute distraction injury with fracture through the L3 vertebral body (8:45, 7:20). Prior L3-L4 L5 laminectomy. Paraspinal and other soft tissues: Prevertebral hematoma. Disc levels: Maintained. Other: Right retroperitoneal hemorrhage as well as asymmetric and heterogeneous right iliopsoas  muscle. Total left hip arthroplasty. Colonic diverticulosis with no acute diverticulitis. Atherosclerotic plaque. Poorly visualized inferior vena cava along the retroperitoneal hematoma. Stranding along the infrarenal abdominal aorta. IMPRESSION: 1. Acute distraction injury with fracture through the L3 vertebral body. Recommend MRI for further evaluation. 2. Associated right retroperitoneal hemorrhage is well as intramuscular right lobe iliopsoas hematoma. Poorly visualized inferior vena cava along the retroperitoneal hematoma with underlying injury not excluded. Stranding along the infrarenal abdominal aorta with limited evaluation. Recommend trauma CT chest abdomen pelvis for further evaluation. 3. No acute displaced fracture or traumatic listhesis of the cervical and thoracic spine. 4. Multilevel severe degenerative changes of the spine. Associated severe osseous neural foraminal stenosis at the C4-C5 levels. 5. Prior L3-L4 L5 laminectomy. Electronically Signed: By: Morgane  Naveau M.D. On: 09/01/2023 20:45   CT Thoracic Spine Wo Contrast Addendum Date: 09/01/2023 ADDENDUM REPORT: 09/01/2023 21:20 ADDENDUM: Unstable L3 distraction injury. Retroperitoneal hemorrhage and intramuscular right iliopsoas hemorrhage. These results were called by telephone at the time of interpretation on 09/01/2023 at 9:20 pm to provider [facility] , who verbally acknowledged these results. Electronically Signed   By: Morgane  Naveau M.D.   On: 09/01/2023 21:20   Result Date: 09/01/2023 CLINICAL DATA:  Polytrauma, blunt; fall midline ttp; fall, midline ttp. Trauma level 2. Fall. Midline tenderness palpation thoracic lumbar spine. EXAM: CT CERVICAL, THORACIC, AND LUMBAR SPINE WITHOUT CONTRAST TECHNIQUE: Multidetector CT imaging of the cervical, thoracic and lumbar spine was performed without intravenous contrast. Multiplanar CT image reconstructions were also generated. RADIATION DOSE REDUCTION: This exam was performed according to  the departmental dose-optimization program which includes automated exposure control, adjustment of the mA and/or kV according to patient size and/or use of iterative reconstruction technique. COMPARISON:  X-ray lumbar spine 09/11/2017 FINDINGS: CT CERVICAL SPINE FINDINGS Alignment: Normal. Skull base and vertebrae: Multilevel severe degenerative changes of the spine. Associated severe osseous neural foraminal stenosis at the C4-C5 levels. No severe osseous central canal stenosis. No acute fracture. No aggressive appearing focal osseous lesion or focal pathologic process. Soft tissues and spinal canal: No prevertebral fluid or swelling. No visible canal hematoma. Upper chest: Azygous fissure noted. Other: Atherosclerotic plaque of the carotid arteries within the neck. CT THORACIC SPINE FINDINGS Alignment: Normal. Vertebrae: Multilevel moderate degenerative changes of the spine. Chronic appearing multilevel vertebral body height loss. No associated severe osseous foraminal or central canal stenosis. No acute fracture or focal pathologic process. Paraspinal and other soft tissues: Negative. Disc levels: Maintained. CT LUMBAR SPINE FINDINGS Segmentation: 5 lumbar type vertebrae. Alignment: Grade 1 anterolisthesis of L4 on L5 and L5 on S1. Vertebrae: Multilevel moderate severe degenerative changes of the spine. No severe osseous neural foraminal or central canal stenosis. Acute  distraction injury with fracture through the L3 vertebral body (8:45, 7:20). Prior L3-L4 L5 laminectomy. Paraspinal and other soft tissues: Prevertebral hematoma. Disc levels: Maintained. Other: Right retroperitoneal hemorrhage as well as asymmetric and heterogeneous right iliopsoas muscle. Total left hip arthroplasty. Colonic diverticulosis with no acute diverticulitis. Atherosclerotic plaque. Poorly visualized inferior vena cava along the retroperitoneal hematoma. Stranding along the infrarenal abdominal aorta. IMPRESSION: 1. Acute distraction  injury with fracture through the L3 vertebral body. Recommend MRI for further evaluation. 2. Associated right retroperitoneal hemorrhage is well as intramuscular right lobe iliopsoas hematoma. Poorly visualized inferior vena cava along the retroperitoneal hematoma with underlying injury not excluded. Stranding along the infrarenal abdominal aorta with limited evaluation. Recommend trauma CT chest abdomen pelvis for further evaluation. 3. No acute displaced fracture or traumatic listhesis of the cervical and thoracic spine. 4. Multilevel severe degenerative changes of the spine. Associated severe osseous neural foraminal stenosis at the C4-C5 levels. 5. Prior L3-L4 L5 laminectomy. Electronically Signed: By: Morgane  Naveau M.D. On: 09/01/2023 20:45   CT Lumbar Spine Wo Contrast Addendum Date: 09/01/2023 ADDENDUM REPORT: 09/01/2023 21:20 ADDENDUM: Unstable L3 distraction injury. Retroperitoneal hemorrhage and intramuscular right iliopsoas hemorrhage. These results were called by telephone at the time of interpretation on 09/01/2023 at 9:20 pm to provider [facility] , who verbally acknowledged these results. Electronically Signed   By: Morgane  Naveau M.D.   On: 09/01/2023 21:20   Result Date: 09/01/2023 CLINICAL DATA:  Polytrauma, blunt; fall midline ttp; fall, midline ttp. Trauma level 2. Fall. Midline tenderness palpation thoracic lumbar spine. EXAM: CT CERVICAL, THORACIC, AND LUMBAR SPINE WITHOUT CONTRAST TECHNIQUE: Multidetector CT imaging of the cervical, thoracic and lumbar spine was performed without intravenous contrast. Multiplanar CT image reconstructions were also generated. RADIATION DOSE REDUCTION: This exam was performed according to the departmental dose-optimization program which includes automated exposure control, adjustment of the mA and/or kV according to patient size and/or use of iterative reconstruction technique. COMPARISON:  X-ray lumbar spine 09/11/2017 FINDINGS: CT CERVICAL SPINE FINDINGS  Alignment: Normal. Skull base and vertebrae: Multilevel severe degenerative changes of the spine. Associated severe osseous neural foraminal stenosis at the C4-C5 levels. No severe osseous central canal stenosis. No acute fracture. No aggressive appearing focal osseous lesion or focal pathologic process. Soft tissues and spinal canal: No prevertebral fluid or swelling. No visible canal hematoma. Upper chest: Azygous fissure noted. Other: Atherosclerotic plaque of the carotid arteries within the neck. CT THORACIC SPINE FINDINGS Alignment: Normal. Vertebrae: Multilevel moderate degenerative changes of the spine. Chronic appearing multilevel vertebral body height loss. No associated severe osseous foraminal or central canal stenosis. No acute fracture or focal pathologic process. Paraspinal and other soft tissues: Negative. Disc levels: Maintained. CT LUMBAR SPINE FINDINGS Segmentation: 5 lumbar type vertebrae. Alignment: Grade 1 anterolisthesis of L4 on L5 and L5 on S1. Vertebrae: Multilevel moderate severe degenerative changes of the spine. No severe osseous neural foraminal or central canal stenosis. Acute distraction injury with fracture through the L3 vertebral body (8:45, 7:20). Prior L3-L4 L5 laminectomy. Paraspinal and other soft tissues: Prevertebral hematoma. Disc levels: Maintained. Other: Right retroperitoneal hemorrhage as well as asymmetric and heterogeneous right iliopsoas muscle. Total left hip arthroplasty. Colonic diverticulosis with no acute diverticulitis. Atherosclerotic plaque. Poorly visualized inferior vena cava along the retroperitoneal hematoma. Stranding along the infrarenal abdominal aorta. IMPRESSION: 1. Acute distraction injury with fracture through the L3 vertebral body. Recommend MRI for further evaluation. 2. Associated right retroperitoneal hemorrhage is well as intramuscular right lobe iliopsoas hematoma. Poorly visualized inferior  vena cava along the retroperitoneal hematoma with  underlying injury not excluded. Stranding along the infrarenal abdominal aorta with limited evaluation. Recommend trauma CT chest abdomen pelvis for further evaluation. 3. No acute displaced fracture or traumatic listhesis of the cervical and thoracic spine. 4. Multilevel severe degenerative changes of the spine. Associated severe osseous neural foraminal stenosis at the C4-C5 levels. 5. Prior L3-L4 L5 laminectomy. Electronically Signed: By: Morgane  Naveau M.D. On: 09/01/2023 20:45   DG Elbow 2 Views Right Result Date: 09/01/2023 CLINICAL DATA:  fall EXAM: RIGHT ELBOW - 2 VIEW COMPARISON:  None Available. FINDINGS: There is no evidence of fracture, dislocation, or joint effusion. Degenerative changes of the elbow. Soft tissues are unremarkable. IMPRESSION: No acute displaced fracture or dislocation. Electronically Signed   By: Morgane  Naveau M.D.   On: 09/01/2023 20:58   DG Knee 2 Views Right Result Date: 09/01/2023 CLINICAL DATA:  fall EXAM: RIGHT KNEE - 1-2 VIEW COMPARISON:  None Available. FINDINGS: Total knee arthroplasty. 3 mm lucency along the inferior aspect of the tibial prosthesis. No evidence of fracture, dislocation, or joint effusion. No evidence of arthropathy or other focal bone abnormality. Soft tissues are unremarkable. Vascular calcification. IMPRESSION: 1.  No acute displaced fracture or dislocation. 2. Total knee arthroplasty. 3 mm lucency along the inferior aspect of the tibial prosthesis. Recommend correlation with prior x-ray. Electronically Signed   By: Morgane  Naveau M.D.   On: 09/01/2023 20:57   CT HEAD WO CONTRAST Result Date: 09/01/2023 CLINICAL DATA:  Head trauma, moderate-severe. Trauma level 2. Fall. EXAM: CT HEAD WITHOUT CONTRAST TECHNIQUE: Contiguous axial images were obtained from the base of the skull through the vertex without intravenous contrast. RADIATION DOSE REDUCTION: This exam was performed according to the departmental dose-optimization program which includes  automated exposure control, adjustment of the mA and/or kV according to patient size and/or use of iterative reconstruction technique. COMPARISON:  MRI head 03/30/2022 FINDINGS: Brain: Cerebral ventricle sizes are concordant with the degree of cerebral volume loss. Patchy and confluent areas of decreased attenuation are noted throughout the deep and periventricular white matter of the cerebral hemispheres bilaterally, compatible with chronic microvascular ischemic disease. No evidence of large-territorial acute infarction. No parenchymal hemorrhage. No mass lesion. No extra-axial collection. No mass effect or midline shift. No hydrocephalus. Basilar cisterns are patent. Vascular: No hyperdense vessel. Atherosclerotic calcifications are present within the cavernous internal carotid arteries. Skull: No acute fracture or focal lesion. Sinuses/Orbits: Left maxillary sinus, bilateral ethmoid, bilateral frontal sinus mucosal thickening. Paranasal sinuses and mastoid air cells are clear. Bilateral lens replacement. Otherwise the orbits are unremarkable. Other: None. IMPRESSION: No acute intracranial abnormality. Electronically Signed   By: Morgane  Naveau M.D.   On: 09/01/2023 20:30   DG Pelvis Portable Result Date: 09/01/2023 CLINICAL DATA:  Trauma EXAM: PORTABLE PELVIS 1-2 VIEWS COMPARISON:  X-ray pelvis 01/26/2018 FINDINGS: Total left hip arthroplasty. There is no evidence of pelvic fracture or diastasis. No pelvic bone lesions are seen. Vascular calcification. Radiation seeds noted overlying the mid pelvis likely prostate treatment. Degenerative changes of the lower lumbar spine. IMPRESSION: Negative for acute traumatic injury. Electronically Signed   By: Morgane  Naveau M.D.   On: 09/01/2023 19:58   DG Chest Port 1 View Result Date: 09/01/2023 CLINICAL DATA:  Trauma EXAM: PORTABLE CHEST 1 VIEW COMPARISON:  Chest x-ray 07/03/2020 FINDINGS: The heart and mediastinal contours are unchanged. Atherosclerotic plaque.  Coronary artery stent. Azygous fissure again noted. No focal consolidation. No pulmonary edema. No pleural effusion. No pneumothorax.  No acute osseous abnormality. IMPRESSION: No active disease. Electronically Signed   By: Morgane  Naveau M.D.   On: 09/01/2023 19:57   VAS US  CAROTID Result Date: 08/22/2023 Carotid Arterial Duplex Study Patient Name:  [name] Ray  Date of Exam:   08/22/2023 Medical Rec #: [id number]        Accession #:    [id number] Date of Birth: April 15, 1933         Patient Gender: M Patient Age:   72 years Exam Location:  [address] Procedure:      VAS US  CAROTID Referring Phys: GLENDIA WEAVER --------------------------------------------------------------------------------  Indications:      Carotid artery disease. Risk Factors:     Hypertension, hyperlipidemia, Diabetes, coronary artery                   disease. Comparison Study: 08/22/22 Performing Technologist: Garnette Rockers  Examination Guidelines: A complete evaluation includes B-mode imaging, spectral Doppler, color Doppler, and power Doppler as needed of all accessible portions of each vessel. Bilateral testing is considered an integral part of a complete examination. Limited examinations for reoccurring indications may be performed as noted.  Right Carotid Findings: +----------+--------+--------+--------+--------------------+------------------+           PSV cm/sEDV cm/sStenosisPlaque Description  Comments           +----------+--------+--------+--------+--------------------+------------------+ CCA Prox  85      6                                   intimal thickening +----------+--------+--------+--------+--------------------+------------------+ CCA Distal86      11                                  intimal thickening +----------+--------+--------+--------+--------------------+------------------+ ICA Prox  116     17      1-39%   focal and hypoechoic                    +----------+--------+--------+--------+--------------------+------------------+ ICA Mid   146     32      1-39%                                          +----------+--------+--------+--------+--------------------+------------------+ ICA Distal82      17                                                     +----------+--------+--------+--------+--------------------+------------------+ ECA       127     0                                                      +----------+--------+--------+--------+--------------------+------------------+ +----------+--------+-------+--------+-------------------+           PSV cm/sEDV cmsDescribeArm Pressure (mmHG) +----------+--------+-------+--------+-------------------+ [id number]                                        +----------+--------+-------+--------+-------------------+ +---------+--------+--+--------+-+  VertebralPSV cm/s37EDV cm/s7 +---------+--------+--+--------+-+  Left Carotid Findings: +----------+--------+--------+--------+-------------------+------------------+           PSV cm/sEDV cm/sStenosisPlaque Description Comments           +----------+--------+--------+--------+-------------------+------------------+ CCA Prox  84      8                                  intimal thickening +----------+--------+--------+--------+-------------------+------------------+ CCA Distal92      9                                  intimal thickening +----------+--------+--------+--------+-------------------+------------------+ ICA Prox  94      21                                 intimal thickening +----------+--------+--------+--------+-------------------+------------------+ ICA Mid   156     35      1-39%   focal and irregular                   +----------+--------+--------+--------+-------------------+------------------+ ICA Distal146     11                                                     +----------+--------+--------+--------+-------------------+------------------+ ECA       114     9                                                     +----------+--------+--------+--------+-------------------+------------------+ +----------+--------+--------+--------+-------------------+           PSV cm/sEDV cm/sDescribeArm Pressure (mmHG) +----------+--------+--------+--------+-------------------+ [id number]                                         +----------+--------+--------+--------+-------------------+ +---------+--------+---+--------+--+ VertebralPSV cm/s107EDV cm/s10 +---------+--------+---+--------+--+   Summary: Right Carotid: Velocities in the right ICA are consistent with a 1-39% stenosis. Left Carotid: Velocities in the left ICA are consistent with a 1-39% stenosis. Vertebrals:  Bilateral vertebral arteries demonstrate antegrade flow. Subclavians: Normal flow hemodynamics were seen in bilateral subclavian              arteries. *See table(s) above for measurements and observations.  Electronically signed by Dorn Ross MD on 08/22/2023 at 12:20:11 PM.    Final     Microbiology: Results for orders placed or performed during the hospital encounter of 02/14/22  SARS CORONAVIRUS 2 (TAT 6-24 HRS) Anterior Nasal Swab     Status: Abnormal   Collection Time: 02/14/22 10:45 AM   Specimen: Anterior Nasal Swab  Result Value Ref Range Status   SARS Coronavirus 2 POSITIVE (A) NEGATIVE Final    Comment: (NOTE) SARS-CoV-2 target nucleic acids are DETECTED.  The SARS-CoV-2 RNA is generally detectable in upper and lower respiratory specimens during the acute phase of infection. Positive results are indicative of the presence of SARS-CoV-2 RNA. Clinical correlation with patient history and other diagnostic information is  necessary to determine patient infection  status. Positive results do not rule out bacterial infection or co-infection with other viruses.  The expected  result is Negative.  Fact Sheet for Patients: HairSlick.no  Fact Sheet for Healthcare Providers: quierodirigir.com  This test is not yet approved or cleared by the [place]  FDA and  has been authorized for detection and/or diagnosis of SARS-CoV-2 by FDA under an Emergency Use Authorization (EUA). This EUA will remain  in effect (meaning this test can be used) for the duration of the COVID-19 declaration under Section 564(b)(1) of the Act, 21 U. S.C. section 360bbb-3(b)(1), unless the authorization is terminated or revoked sooner.   Performed at [facility], [address] [place], [place] [address]     Labs: CBC: Recent Labs  Lab 09/01/23 1922 09/01/23 1928 09/02/23 0659 09/03/23 0250  WBC 7.7  --  7.5 5.9  HGB 10.8* 10.9* 10.0* 8.7*  HCT 34.4* 32.0* 30.5* 26.4*  MCV 98.3  --  93.8 94.3  PLT 136*  --  125* 120*   Basic Metabolic Panel: Recent Labs  Lab 09/01/23 1922 09/01/23 1928 09/02/23 0659  NA 133* 132* 132*  K 4.6 4.7 4.7  CL 98 99 102  CO2 21*  --  21*  GLUCOSE 135* 131* 96  BUN 31* 31* 28*  CREATININE 1.25* 1.30* 1.14  CALCIUM 9.0  --  8.6*   Liver Function Tests: Recent Labs  Lab 09/01/23 1922 09/02/23 0659  AST 24 23  ALT 17 17  ALKPHOS 43 40  BILITOT 0.4 0.8  PROT 5.9* 5.6*  ALBUMIN 3.7 3.4*   CBG: Recent Labs  Lab 09/02/23 1208 09/02/23 1658 09/02/23 2038 09/03/23 0934 09/03/23 1212  GLUCAP 125* 116* 108* 142* 142*    Discharge time spent: approximately 45 minutes spent on discharge counseling, evaluation of patient on day of discharge, and coordination of discharge planning with nursing, social work, pharmacy and case management  Signed: Lonni SHAUNNA Dalton, MD [facility] 09/03/2023

## 2023-09-03 NOTE — TOC Initial Note (Addendum)
 Transition of Care (TOC) - Initial/Assessment Note   Spoke to patient and wife [name] Ray at bedside. PT recommending HHPT.   Wife to be taught wound care prior to discharge. Asked if they want a HHRN to check on wound , wife does.   Their son in law is a PT who does in home PT services.   They called son in law [name] Ray . He can provide home health PT , and assist with wound care. He works for [employer] , they do not have Charity fundraiser.  Patient and wife aware of above and patient wants to use [facility]. Patient wanting to be sure [name] will get paid for it. [name] told patient and his wife that he will be paid for PT visits.   [name] will call patient back with fax number for NCM to fax order to .   MD ordered OT with PT . Will need to check with [name] to see if they have OT   [name] Ray called [name] back, his company does not offer OT. Patient , wife and [name] aware , [facility] has no OT. Patient still wanting to use [facility] , understands they cannot provide OT or RN. [name] asking MD to change order to HHPT only. Sent secure chat.   [facility] phone [phone] fax [phone]   Wife requesting NCM to call Dr Tisovec to schedule follow up appointment.   NCM called Dr Vernadine office was transferred three times and transferred to Dr Tisovec assistant's voicemail. NCM left message stating patient being discharged today and needs follow up appointment. Wife wants Dr Eura office to call patient's cell phone number to schedule. Information was left on voicemail. Phone was on speaker , wife and patient heard voicemail that was left   Received order for HHPT. Faxed orders and clinicals to [facility]    Patient Details  Name: [name] Ray MRN: [id number] Date of Birth: 1933/01/18  Transition of Care [facility]) CM/SW Contact:    Stephane Powell Jansky, RN Phone Number: 09/03/2023, 11:59 AM  Clinical Narrative:                   Expected Discharge Plan: Home w Home Health Services Barriers to Discharge:  (see  note)   Patient Goals and CMS Choice Patient states their goals for this hospitalization and ongoing recovery are:: to return to home CMS Medicare.gov Compare Post Acute Care list provided to:: Patient Choice offered to / list presented to : Patient, Spouse      Expected Discharge Plan and Services   Discharge Planning Services: CM Consult Post Acute Care Choice: Home Health Living arrangements for the past 2 months: Single Family Home Expected Discharge Date: 09/03/23               DME Arranged: N/A DME Agency: NA       HH Arranged: PT, OT          Prior Living Arrangements/Services Living arrangements for the past 2 months: Single Family Home Lives with:: Spouse Patient language and need for interpreter reviewed:: Yes Do you feel safe going back to the place where you live?: Yes      Need for Family Participation in Patient Care: Yes (Comment) Care giver support system in place?: Yes (comment)   Criminal Activity/Legal Involvement Pertinent to Current Situation/Hospitalization: No - Comment as needed  Activities of Daily Living   ADL Screening (condition at time of admission) Independently performs ADLs?: Yes (  appropriate for developmental age) Is the patient deaf or have difficulty hearing?: No Does the patient have difficulty seeing, even when wearing glasses/contacts?: No Does the patient have difficulty concentrating, remembering, or making decisions?: No  Permission Sought/Granted Permission sought to share information with : PCP Permission granted to share information with : Yes, Verbal Permission Granted  Share Information with NAME: wife [name] Ray at bedside           Emotional Assessment Appearance:: Appears stated age Attitude/Demeanor/Rapport: Engaged Affect (typically observed): Appropriate Orientation: : Oriented to Self, Oriented to Place, Oriented to  Time, Oriented to Situation Alcohol  / Substance Use: Not Applicable Psych Involvement: No  (comment)  Admission diagnosis:  L3 vertebral fracture (HCC) [S32.039A] Other closed fracture of third lumbar vertebra, initial encounter (HCC) [S32.038A] Patient Active Problem List   Diagnosis Date Noted   L3 vertebral fracture (HCC) 09/02/2023   Pain due to onychomycosis of toenails of both feet 10/07/2022   AV block, Mob[place] 1 11/27/2021   Pancytopenia (HCC)    Diarrhea    Tick bite    Chest pain 07/04/2020   SIRS (systemic inflammatory response syndrome) (HCC) 07/04/2020   Renal insufficiency 07/04/2020   BPH (benign prostatic hyperplasia) 07/04/2020   OA (osteoarthritis) of hip 01/26/2018   Spinal stenosis of lumbar region with neurogenic claudication 09/11/2017   Dizziness 05/04/2014   Ejection fraction    S/P total knee arthroplasty 05/24/2013   Carotid artery disease (HCC)    Coronary artery disease    Hyperlipidemia    GERD (gastroesophageal reflux disease)    Hypertension    Numbness and tingling in both hands    FECAL OCCULT BLOOD 10/19/2009   History of colonic polyps 10/19/2009   OVERWEIGHT 11/08/2008   Type 2 diabetes mellitus with hyperlipidemia (HCC) 10/25/2008   PCP:  Vernadine Charlie ORN, MD Pharmacy:   [facility] [address] [place] - [address] [place] [address] Phone: [phone] Fax: [phone]  [facility] [address] [place] [address] Phone: [phone] Fax: [phone]     Social Drivers of Health (SDOH) Social History: SDOH Screenings   Food Insecurity: No Food Insecurity (09/02/2023)  Housing: Low Risk  (09/02/2023)  Transportation Needs: No Transportation Needs (09/02/2023)  Utilities: Not At Risk (09/02/2023)  Social Connections: Socially Integrated (09/02/2023)  Tobacco Use: Low Risk  (09/01/2023)   SDOH Interventions:     Readmission Risk Interventions     No data to display

## 2023-09-03 NOTE — Progress Notes (Signed)
 Mobility Specialist Progress Note:   09/03/23 1227  Mobility  Activity Ambulated with assistance (In hallway)  Level of Assistance Standby assist, set-up cues, supervision of patient - no hands on  Assistive Device Front wheel walker  Distance Ambulated (ft) 375 ft  Activity Response Tolerated well  Mobility Referral Yes  Mobility visit 1 Mobility  Mobility Specialist Start Time (ACUTE ONLY) 1201  Mobility Specialist Stop Time (ACUTE ONLY) 1213  Mobility Specialist Time Calculation (min) (ACUTE ONLY) 12 min   Received pt in chair and agreeable to mobility. No physical assistance needed. No c/o. Returned to room without fault. Left pt in chair with personal belongings and call light within reach. All needs met.  Lavanda Pollack Mobility Specialist  Please contact via [employer] or  Rehab Office [phone]

## 2023-09-03 NOTE — Progress Notes (Signed)
 Physical Therapy Treatment Patient Details Name: [name] Ray MRN: [id number] DOB: [birth year] Today's Date: 09/03/2023   History of Present Illness Pt is an 89 y.o. male who presents 09/01/23 s/p mechanical fall, sustaining an L3 vertebral fracture. Plan is for non-operative management with back brace. PMH significant of CAD, prostate cancer, HTN, T2DM.    PT Comments  Pt up in chair on arrival, pleasant and agreeable to session. Pt demonstrating continues progress towards acute goals, progressing gait distance with RW for support and grossly CGA for safety. Pt with good adherence to all precautions throughout session and able to verbally recall.Continued education on precautions, brace application/wearing schedule, appropriate activity progression, and car transfer, with pt verbalizing understanding. Anticipate safe discharge, with assist level outlined below, once medically cleared, will continue to follow acutely.      If plan is discharge home, recommend the following: A little help with walking and/or transfers;A little help with bathing/dressing/bathroom;Assistance with cooking/housework;Assist for transportation;Help with stairs or ramp for entrance   Can travel by private vehicle        Equipment Recommendations  None recommended by PT    Recommendations for Other Services       Precautions / Restrictions Precautions Precautions: Back Precaution Booklet Issued: Yes (comment) Recall of Precautions/Restrictions: Intact Precaution/Restrictions Comments: Reviewed precautions during functional mobility. Handout present in room Required Braces or Orthoses: Spinal Brace Spinal Brace: Lumbar corset;Other (comment) (donned on arrival) Restrictions Weight Bearing Restrictions Per Provider Order: No     Mobility  Bed Mobility Overal bed mobility: Needs Assistance             General bed mobility comments: seated up in chair on arrival and at end of session     Transfers Overall transfer level: Needs assistance Equipment used: Rolling walker (2 wheels) Transfers: Sit to/from Stand Sit to Stand: Supervision           General transfer comment: good recall for hand placement and technique    Ambulation/Gait Ambulation/Gait assistance: Contact guard assist Gait Distance (Feet): 240 Feet Assistive device: Rolling walker (2 wheels) Gait Pattern/deviations: Step-through pattern, Decreased stride length, Trunk flexed Gait velocity: Decreased     General Gait Details: good recall for positioning in RW, No assist required but hands on guarding provided throughout for safety.   Stairs             Wheelchair Mobility     Tilt Bed    Modified Rankin (Stroke Patients Only)       Balance Overall balance assessment: Needs assistance Sitting-balance support: Feet supported Sitting balance-Leahy Scale: Fair     Standing balance support: Reliant on assistive device for balance Standing balance-Leahy Scale: Poor Standing balance comment: dynamically                            Communication Communication Communication: No apparent difficulties  Cognition Arousal: Alert Behavior During Therapy: WFL for tasks assessed/performed   PT - Cognitive impairments: No apparent impairments                         Following commands: Intact      Cueing Cueing Techniques: Verbal cues  Exercises      General Comments        Pertinent Vitals/Pain Pain Assessment Pain Assessment: 0-10 Pain Score: 4  Pain Location: low back with mobility Pain Descriptors / Indicators: Sore Pain Intervention(s): Monitored during session, Limited activity  within patient's tolerance    Home Living Family/patient expects to be discharged to:: Private residence Living Arrangements: Spouse/significant other                      Prior Function            PT Goals (current goals can now be found in the care plan  section) Acute Rehab PT Goals PT Goal Formulation: With patient/family Time For Goal Achievement: 09/09/23 Progress towards PT goals: Progressing toward goals    Frequency    Min 2X/week      PT Plan      Co-evaluation              AM-PAC PT 6 Clicks Mobility   Outcome Measure  Help needed turning from your back to your side while in a flat bed without using bedrails?: A Little Help needed moving from lying on your back to sitting on the side of a flat bed without using bedrails?: A Little Help needed moving to and from a bed to a chair (including a wheelchair)?: A Little Help needed standing up from a chair using your arms (e.g., wheelchair or bedside chair)?: A Little Help needed to walk in hospital room?: A Little Help needed climbing 3-5 steps with a railing? : A Little 6 Click Score: 18    End of Session Equipment Utilized During Treatment: Back brace Activity Tolerance: Patient tolerated treatment well Patient left: with call bell/phone within reach;in chair Nurse Communication: Mobility status PT Visit Diagnosis: Unsteadiness on feet (R26.81);Pain Pain - part of body:  (back)     Time: 9144-9082 PT Time Calculation (min) (ACUTE ONLY): 22 min  Charges:    $Gait Training: 8-22 mins PT General Charges $$ ACUTE PT VISIT: 1 Visit                     Tasnim Balentine R. PTA Acute Rehabilitation Services Office: [phone]   [name] CHRISTELLA Boor 09/03/2023, 9:21 AM

## 2023-09-08 DIAGNOSIS — I10 Essential (primary) hypertension: Secondary | ICD-10-CM | POA: Diagnosis not present

## 2023-09-08 DIAGNOSIS — K683 Retroperitoneal hematoma: Secondary | ICD-10-CM | POA: Diagnosis not present

## 2023-09-08 DIAGNOSIS — S7011XA Contusion of right thigh, initial encounter: Secondary | ICD-10-CM | POA: Diagnosis not present

## 2023-09-08 DIAGNOSIS — S32039A Unspecified fracture of third lumbar vertebra, initial encounter for closed fracture: Secondary | ICD-10-CM | POA: Diagnosis not present

## 2023-09-08 DIAGNOSIS — W19XXXA Unspecified fall, initial encounter: Secondary | ICD-10-CM | POA: Diagnosis not present

## 2023-09-08 DIAGNOSIS — Z7902 Long term (current) use of antithrombotics/antiplatelets: Secondary | ICD-10-CM | POA: Diagnosis not present

## 2023-09-08 DIAGNOSIS — Z96642 Presence of left artificial hip joint: Secondary | ICD-10-CM | POA: Diagnosis not present

## 2023-09-08 DIAGNOSIS — D62 Acute posthemorrhagic anemia: Secondary | ICD-10-CM | POA: Diagnosis not present

## 2023-09-08 DIAGNOSIS — E1151 Type 2 diabetes mellitus with diabetic peripheral angiopathy without gangrene: Secondary | ICD-10-CM | POA: Diagnosis not present

## 2023-09-08 DIAGNOSIS — I119 Hypertensive heart disease without heart failure: Secondary | ICD-10-CM | POA: Diagnosis not present

## 2023-09-10 DIAGNOSIS — R269 Unspecified abnormalities of gait and mobility: Secondary | ICD-10-CM | POA: Diagnosis not present

## 2023-09-10 DIAGNOSIS — M5441 Lumbago with sciatica, right side: Secondary | ICD-10-CM | POA: Diagnosis not present

## 2023-09-10 DIAGNOSIS — G8929 Other chronic pain: Secondary | ICD-10-CM | POA: Diagnosis not present

## 2023-09-19 DIAGNOSIS — G8929 Other chronic pain: Secondary | ICD-10-CM | POA: Diagnosis not present

## 2023-09-19 DIAGNOSIS — M5441 Lumbago with sciatica, right side: Secondary | ICD-10-CM | POA: Diagnosis not present

## 2023-09-19 DIAGNOSIS — R269 Unspecified abnormalities of gait and mobility: Secondary | ICD-10-CM | POA: Diagnosis not present

## 2023-09-22 DIAGNOSIS — E538 Deficiency of other specified B group vitamins: Secondary | ICD-10-CM | POA: Diagnosis not present

## 2023-09-22 DIAGNOSIS — I119 Hypertensive heart disease without heart failure: Secondary | ICD-10-CM | POA: Diagnosis not present

## 2023-09-22 DIAGNOSIS — Z125 Encounter for screening for malignant neoplasm of prostate: Secondary | ICD-10-CM | POA: Diagnosis not present

## 2023-09-22 DIAGNOSIS — E7800 Pure hypercholesterolemia, unspecified: Secondary | ICD-10-CM | POA: Diagnosis not present

## 2023-09-22 DIAGNOSIS — E1151 Type 2 diabetes mellitus with diabetic peripheral angiopathy without gangrene: Secondary | ICD-10-CM | POA: Diagnosis not present

## 2023-09-22 DIAGNOSIS — E7849 Other hyperlipidemia: Secondary | ICD-10-CM | POA: Diagnosis not present

## 2023-09-24 DIAGNOSIS — E119 Type 2 diabetes mellitus without complications: Secondary | ICD-10-CM | POA: Diagnosis not present

## 2023-09-26 DIAGNOSIS — G8929 Other chronic pain: Secondary | ICD-10-CM | POA: Diagnosis not present

## 2023-09-26 DIAGNOSIS — R269 Unspecified abnormalities of gait and mobility: Secondary | ICD-10-CM | POA: Diagnosis not present

## 2023-09-26 DIAGNOSIS — M5441 Lumbago with sciatica, right side: Secondary | ICD-10-CM | POA: Diagnosis not present

## 2023-09-29 DIAGNOSIS — M5416 Radiculopathy, lumbar region: Secondary | ICD-10-CM | POA: Diagnosis not present

## 2023-09-29 DIAGNOSIS — M25511 Pain in right shoulder: Secondary | ICD-10-CM | POA: Diagnosis not present

## 2023-09-29 DIAGNOSIS — G5601 Carpal tunnel syndrome, right upper limb: Secondary | ICD-10-CM | POA: Diagnosis not present

## 2023-09-29 DIAGNOSIS — E669 Obesity, unspecified: Secondary | ICD-10-CM | POA: Diagnosis not present

## 2023-09-29 DIAGNOSIS — R82998 Other abnormal findings in urine: Secondary | ICD-10-CM | POA: Diagnosis not present

## 2023-09-29 DIAGNOSIS — Z9861 Coronary angioplasty status: Secondary | ICD-10-CM | POA: Diagnosis not present

## 2023-09-29 DIAGNOSIS — K219 Gastro-esophageal reflux disease without esophagitis: Secondary | ICD-10-CM | POA: Diagnosis not present

## 2023-09-29 DIAGNOSIS — I10 Essential (primary) hypertension: Secondary | ICD-10-CM | POA: Diagnosis not present

## 2023-09-29 DIAGNOSIS — E1151 Type 2 diabetes mellitus with diabetic peripheral angiopathy without gangrene: Secondary | ICD-10-CM | POA: Diagnosis not present

## 2023-09-29 DIAGNOSIS — E538 Deficiency of other specified B group vitamins: Secondary | ICD-10-CM | POA: Diagnosis not present

## 2023-09-29 DIAGNOSIS — Z1339 Encounter for screening examination for other mental health and behavioral disorders: Secondary | ICD-10-CM | POA: Diagnosis not present

## 2023-09-29 DIAGNOSIS — Z1331 Encounter for screening for depression: Secondary | ICD-10-CM | POA: Diagnosis not present

## 2023-09-29 DIAGNOSIS — Z0000 Encounter for general adult medical examination without abnormal findings: Secondary | ICD-10-CM | POA: Diagnosis not present

## 2023-09-30 DIAGNOSIS — R269 Unspecified abnormalities of gait and mobility: Secondary | ICD-10-CM | POA: Diagnosis not present

## 2023-09-30 DIAGNOSIS — G8929 Other chronic pain: Secondary | ICD-10-CM | POA: Diagnosis not present

## 2023-09-30 DIAGNOSIS — M5441 Lumbago with sciatica, right side: Secondary | ICD-10-CM | POA: Diagnosis not present

## 2023-10-07 DIAGNOSIS — R269 Unspecified abnormalities of gait and mobility: Secondary | ICD-10-CM | POA: Diagnosis not present

## 2023-10-07 DIAGNOSIS — S32039D Unspecified fracture of third lumbar vertebra, subsequent encounter for fracture with routine healing: Secondary | ICD-10-CM | POA: Diagnosis not present

## 2023-10-07 DIAGNOSIS — G8929 Other chronic pain: Secondary | ICD-10-CM | POA: Diagnosis not present

## 2023-10-07 DIAGNOSIS — M5441 Lumbago with sciatica, right side: Secondary | ICD-10-CM | POA: Diagnosis not present

## 2023-10-14 DIAGNOSIS — G8929 Other chronic pain: Secondary | ICD-10-CM | POA: Diagnosis not present

## 2023-10-14 DIAGNOSIS — R269 Unspecified abnormalities of gait and mobility: Secondary | ICD-10-CM | POA: Diagnosis not present

## 2023-10-14 DIAGNOSIS — M5441 Lumbago with sciatica, right side: Secondary | ICD-10-CM | POA: Diagnosis not present

## 2023-10-21 DIAGNOSIS — R269 Unspecified abnormalities of gait and mobility: Secondary | ICD-10-CM | POA: Diagnosis not present

## 2023-10-21 DIAGNOSIS — G8929 Other chronic pain: Secondary | ICD-10-CM | POA: Diagnosis not present

## 2023-10-21 DIAGNOSIS — M5441 Lumbago with sciatica, right side: Secondary | ICD-10-CM | POA: Diagnosis not present

## 2023-10-28 DIAGNOSIS — G8929 Other chronic pain: Secondary | ICD-10-CM | POA: Diagnosis not present

## 2023-10-28 DIAGNOSIS — M5441 Lumbago with sciatica, right side: Secondary | ICD-10-CM | POA: Diagnosis not present

## 2023-10-28 DIAGNOSIS — R269 Unspecified abnormalities of gait and mobility: Secondary | ICD-10-CM | POA: Diagnosis not present

## 2023-11-04 DIAGNOSIS — R269 Unspecified abnormalities of gait and mobility: Secondary | ICD-10-CM | POA: Diagnosis not present

## 2023-11-04 DIAGNOSIS — G8929 Other chronic pain: Secondary | ICD-10-CM | POA: Diagnosis not present

## 2023-11-04 DIAGNOSIS — M5441 Lumbago with sciatica, right side: Secondary | ICD-10-CM | POA: Diagnosis not present

## 2023-11-06 ENCOUNTER — Ambulatory Visit: Admitting: Podiatry

## 2023-11-06 DIAGNOSIS — S32039D Unspecified fracture of third lumbar vertebra, subsequent encounter for fracture with routine healing: Secondary | ICD-10-CM | POA: Diagnosis not present

## 2023-11-10 ENCOUNTER — Ambulatory Visit: Admitting: Podiatry

## 2023-11-10 ENCOUNTER — Encounter: Payer: Self-pay | Admitting: Podiatry

## 2023-11-10 DIAGNOSIS — E785 Hyperlipidemia, unspecified: Secondary | ICD-10-CM

## 2023-11-10 DIAGNOSIS — B351 Tinea unguium: Secondary | ICD-10-CM

## 2023-11-10 DIAGNOSIS — Z7901 Long term (current) use of anticoagulants: Secondary | ICD-10-CM

## 2023-11-10 DIAGNOSIS — M79674 Pain in right toe(s): Secondary | ICD-10-CM | POA: Diagnosis not present

## 2023-11-10 DIAGNOSIS — E1169 Type 2 diabetes mellitus with other specified complication: Secondary | ICD-10-CM | POA: Diagnosis not present

## 2023-11-10 DIAGNOSIS — M79675 Pain in left toe(s): Secondary | ICD-10-CM

## 2023-11-10 NOTE — Progress Notes (Signed)
This patient returns to my office for at risk foot care.  This patient requires this care by a professional since this patient will be at risk due to having diabetes.  This patient is unable to cut nails himself since the patient cannot reach his nails.These nails are painful walking and wearing shoes.  He presents to the office with his wife.  This patient presents for at risk foot care today.  General Appearance  Alert, conversant and in no acute stress.  Vascular  Dorsalis pedis and posterior tibial  pulses are weakly  palpable  bilaterally.  Capillary return is within normal limits  bilaterally. Temperature is within normal limits  bilaterally.  Neurologic  Senn-Weinstein monofilament wire test within normal limits  bilaterally. Muscle power within normal limits bilaterally.  Nails Thick disfigured discolored nails with subungual debris  from hallux to fifth toes bilaterally. No evidence of bacterial infection or drainage bilaterally.  Orthopedic  No limitations of motion  feet .  No crepitus or effusions noted.  No bony pathology or digital deformities noted.  Skin  normotropic skin with no porokeratosis noted bilaterally.  No signs of infections or ulcers noted.     Onychomycosis  Pain in right toes  Pain in left toes  Consent was obtained for treatment procedures.   Mechanical debridement of nails 1-5  bilaterally performed with a nail nipper.  Filed with dremel without incident. Cauterized hallux  B/L.   Return office visit   3 months                   Told patient to return for periodic foot care and evaluation due to potential at risk complications.   Helane Gunther DPM

## 2023-11-11 DIAGNOSIS — M5441 Lumbago with sciatica, right side: Secondary | ICD-10-CM | POA: Diagnosis not present

## 2023-11-11 DIAGNOSIS — G8929 Other chronic pain: Secondary | ICD-10-CM | POA: Diagnosis not present

## 2023-11-11 DIAGNOSIS — R269 Unspecified abnormalities of gait and mobility: Secondary | ICD-10-CM | POA: Diagnosis not present

## 2023-11-13 ENCOUNTER — Encounter (HOSPITAL_COMMUNITY): Payer: Self-pay | Admitting: Internal Medicine

## 2023-11-13 ENCOUNTER — Observation Stay (HOSPITAL_COMMUNITY)
Admission: EM | Admit: 2023-11-13 | Discharge: 2023-11-18 | Disposition: A | Discharge status: Home / Self Care | Attending: Family Medicine | Admitting: Family Medicine

## 2023-11-13 ENCOUNTER — Emergency Department (HOSPITAL_COMMUNITY)

## 2023-11-13 ENCOUNTER — Other Ambulatory Visit: Payer: Self-pay

## 2023-11-13 ENCOUNTER — Observation Stay (HOSPITAL_COMMUNITY)

## 2023-11-13 DIAGNOSIS — J90 Pleural effusion, not elsewhere classified: Secondary | ICD-10-CM | POA: Diagnosis not present

## 2023-11-13 DIAGNOSIS — R2689 Other abnormalities of gait and mobility: Secondary | ICD-10-CM | POA: Insufficient documentation

## 2023-11-13 DIAGNOSIS — Z7901 Long term (current) use of anticoagulants: Secondary | ICD-10-CM | POA: Diagnosis not present

## 2023-11-13 DIAGNOSIS — J9811 Atelectasis: Secondary | ICD-10-CM | POA: Diagnosis not present

## 2023-11-13 DIAGNOSIS — R0602 Shortness of breath: Secondary | ICD-10-CM | POA: Diagnosis not present

## 2023-11-13 DIAGNOSIS — I5032 Chronic diastolic (congestive) heart failure: Secondary | ICD-10-CM | POA: Diagnosis not present

## 2023-11-13 DIAGNOSIS — E875 Hyperkalemia: Secondary | ICD-10-CM | POA: Diagnosis not present

## 2023-11-13 DIAGNOSIS — I130 Hypertensive heart and chronic kidney disease with heart failure and stage 1 through stage 4 chronic kidney disease, or unspecified chronic kidney disease: Secondary | ICD-10-CM | POA: Diagnosis not present

## 2023-11-13 DIAGNOSIS — I5031 Acute diastolic (congestive) heart failure: Secondary | ICD-10-CM

## 2023-11-13 DIAGNOSIS — R0789 Other chest pain: Secondary | ICD-10-CM | POA: Diagnosis not present

## 2023-11-13 DIAGNOSIS — R071 Chest pain on breathing: Secondary | ICD-10-CM | POA: Diagnosis not present

## 2023-11-13 DIAGNOSIS — I25119 Atherosclerotic heart disease of native coronary artery with unspecified angina pectoris: Secondary | ICD-10-CM | POA: Diagnosis not present

## 2023-11-13 DIAGNOSIS — I2510 Atherosclerotic heart disease of native coronary artery without angina pectoris: Secondary | ICD-10-CM | POA: Diagnosis not present

## 2023-11-13 DIAGNOSIS — R079 Chest pain, unspecified: Secondary | ICD-10-CM

## 2023-11-13 DIAGNOSIS — E785 Hyperlipidemia, unspecified: Secondary | ICD-10-CM | POA: Diagnosis not present

## 2023-11-13 DIAGNOSIS — D649 Anemia, unspecified: Secondary | ICD-10-CM | POA: Diagnosis present

## 2023-11-13 DIAGNOSIS — I300 Acute nonspecific idiopathic pericarditis: Secondary | ICD-10-CM

## 2023-11-13 DIAGNOSIS — Z7982 Long term (current) use of aspirin: Secondary | ICD-10-CM | POA: Insufficient documentation

## 2023-11-13 DIAGNOSIS — E119 Type 2 diabetes mellitus without complications: Secondary | ICD-10-CM | POA: Diagnosis not present

## 2023-11-13 DIAGNOSIS — N1831 Chronic kidney disease, stage 3a: Secondary | ICD-10-CM | POA: Diagnosis not present

## 2023-11-13 DIAGNOSIS — E7800 Pure hypercholesterolemia, unspecified: Secondary | ICD-10-CM

## 2023-11-13 DIAGNOSIS — R0781 Pleurodynia: Secondary | ICD-10-CM | POA: Diagnosis not present

## 2023-11-13 DIAGNOSIS — I4891 Unspecified atrial fibrillation: Secondary | ICD-10-CM | POA: Diagnosis present

## 2023-11-13 DIAGNOSIS — R7989 Other specified abnormal findings of blood chemistry: Secondary | ICD-10-CM | POA: Diagnosis not present

## 2023-11-13 DIAGNOSIS — I2489 Other forms of acute ischemic heart disease: Secondary | ICD-10-CM | POA: Diagnosis not present

## 2023-11-13 DIAGNOSIS — I700 Atherosclerosis of aorta: Secondary | ICD-10-CM | POA: Diagnosis not present

## 2023-11-13 LAB — ECHOCARDIOGRAM COMPLETE
AR max vel: 1.87 cm2
AV Area VTI: 1.64 cm2
AV Area mean vel: 1.6 cm2
AV Mean grad: 4 mmHg
AV Peak grad: 8.4 mmHg
Ao pk vel: 1.45 m/s
Area-P 1/2: 4.96 cm2
Calc EF: 61.3 %
Height: 66 in
MV VTI: 1.9 cm2
S' Lateral: 2.8 cm
Single Plane A2C EF: 64.4 %
Single Plane A4C EF: 57.9 %
Weight: 2998.26 [oz_av]

## 2023-11-13 LAB — CBC
HCT: 35.2 % — ABNORMAL LOW (ref 39.0–52.0)
Hemoglobin: 11.5 g/dL — ABNORMAL LOW (ref 13.0–17.0)
MCH: 30.5 pg (ref 26.0–34.0)
MCHC: 32.7 g/dL (ref 30.0–36.0)
MCV: 93.4 fL (ref 80.0–100.0)
Platelets: 153 K/uL (ref 150–400)
RBC: 3.77 MIL/uL — ABNORMAL LOW (ref 4.22–5.81)
RDW: 13.5 % (ref 11.5–15.5)
WBC: 8.2 K/uL (ref 4.0–10.5)
nRBC: 0 % (ref 0.0–0.2)

## 2023-11-13 LAB — BRAIN NATRIURETIC PEPTIDE: B Natriuretic Peptide: 625.7 pg/mL — ABNORMAL HIGH (ref 0.0–100.0)

## 2023-11-13 LAB — BASIC METABOLIC PANEL WITH GFR
Anion gap: 12 (ref 5–15)
BUN: 29 mg/dL — ABNORMAL HIGH (ref 8–23)
CO2: 26 mmol/L (ref 22–32)
Calcium: 9.2 mg/dL (ref 8.9–10.3)
Chloride: 98 mmol/L (ref 98–111)
Creatinine, Ser: 1.27 mg/dL — ABNORMAL HIGH (ref 0.61–1.24)
GFR, Estimated: 54 mL/min — ABNORMAL LOW (ref >60)
Glucose, Bld: 122 mg/dL — ABNORMAL HIGH (ref 70–99)
Potassium: 5.5 mmol/L — ABNORMAL HIGH (ref 3.5–5.1)
Sodium: 136 mmol/L (ref 135–145)

## 2023-11-13 LAB — TSH: TSH: 1.617 u[IU]/mL (ref 0.350–4.500)

## 2023-11-13 LAB — LIPID PANEL
Cholesterol: 113 mg/dL (ref 0–200)
HDL: 70 mg/dL (ref >40)
LDL Cholesterol: 37 mg/dL (ref 0–99)
Total CHOL/HDL Ratio: 1.6 ratio
Triglycerides: 28 mg/dL (ref <150)
VLDL: 6 mg/dL (ref 0–40)

## 2023-11-13 LAB — D-DIMER, QUANTITATIVE: D-Dimer, Quant: 0.97 ug{FEU}/mL — ABNORMAL HIGH (ref 0.00–0.50)

## 2023-11-13 LAB — C-REACTIVE PROTEIN: CRP: 2.5 mg/dL — ABNORMAL HIGH (ref <1.0)

## 2023-11-13 LAB — TROPONIN I (HIGH SENSITIVITY)
Troponin I (High Sensitivity): 38 ng/L — ABNORMAL HIGH (ref <18)
Troponin I (High Sensitivity): 33 ng/L — ABNORMAL HIGH (ref <18)

## 2023-11-13 LAB — GLUCOSE, CAPILLARY
Glucose-Capillary: 89 mg/dL (ref 70–99)
Glucose-Capillary: 147 mg/dL — ABNORMAL HIGH (ref 70–99)

## 2023-11-13 LAB — SEDIMENTATION RATE: Sed Rate: 14 mm/h (ref 0–16)

## 2023-11-13 LAB — HEPARIN LEVEL (UNFRACTIONATED): Heparin Unfractionated: 0.41 [IU]/mL (ref 0.30–0.70)

## 2023-11-13 LAB — MAGNESIUM: Magnesium: 1.7 mg/dL (ref 1.7–2.4)

## 2023-11-13 MED ORDER — ACETAMINOPHEN 325 MG PO TABS
650.0000 mg | ORAL_TABLET | Freq: Four times a day (QID) | ORAL | Status: DC | PRN
Start: 2023-11-13 — End: 2023-11-14

## 2023-11-13 MED ORDER — TAMSULOSIN HCL 0.4 MG PO CAPS
0.4000 mg | ORAL_CAPSULE | Freq: Every day | ORAL | Status: DC
  Administered 2023-11-13 – 2023-11-17 (×5): 0.4 mg via ORAL
  Filled 2023-11-13 (×5): qty 1

## 2023-11-13 MED ORDER — CLOPIDOGREL BISULFATE 75 MG PO TABS: 75.0000 mg | ORAL_TABLET | Freq: Every day | ORAL | Status: DC

## 2023-11-13 MED ORDER — HEPARIN BOLUS VIA INFUSION
4000.0000 [IU] | Freq: Once | INTRAVENOUS | Status: AC
  Administered 2023-11-13: 4000 [IU] via INTRAVENOUS
  Filled 2023-11-13: qty 4000

## 2023-11-13 MED ORDER — FUROSEMIDE 10 MG/ML IJ SOLN
40.0000 mg | Freq: Once | INTRAMUSCULAR | Status: AC
  Administered 2023-11-13: 40 mg via INTRAVENOUS
  Filled 2023-11-13: qty 4

## 2023-11-13 MED ORDER — OXYCODONE-ACETAMINOPHEN 5-325 MG PO TABS
1.0000 | ORAL_TABLET | Freq: Four times a day (QID) | ORAL | Status: DC | PRN
  Administered 2023-11-13 – 2023-11-15 (×4): 1 via ORAL
  Filled 2023-11-13 (×4): qty 1

## 2023-11-13 MED ORDER — HEPARIN (PORCINE) 25000 UT/250ML-% IV SOLN
1200.0000 [IU]/h | INTRAVENOUS | Status: DC
  Administered 2023-11-13 – 2023-11-14 (×2): 1200 [IU]/h via INTRAVENOUS
  Filled 2023-11-13 (×2): qty 250

## 2023-11-13 MED ORDER — IOHEXOL 350 MG/ML SOLN
75.0000 mL | Freq: Once | INTRAVENOUS | Status: AC | PRN
  Administered 2023-11-13: 75 mL via INTRAVENOUS

## 2023-11-13 MED ORDER — LIDOCAINE 5 % EX PTCH
1.0000 | MEDICATED_PATCH | CUTANEOUS | Status: DC
  Administered 2023-11-13 – 2023-11-17 (×5): 1 via TRANSDERMAL
  Filled 2023-11-13 (×5): qty 1

## 2023-11-13 MED ORDER — ACETAMINOPHEN 650 MG RE SUPP: 650.0000 mg | Freq: Four times a day (QID) | RECTAL | Status: DC | PRN

## 2023-11-13 MED ORDER — LIDOCAINE 5 % EX PTCH
1.0000 | MEDICATED_PATCH | Freq: Every day | CUTANEOUS | Status: DC | PRN
  Administered 2023-11-13: 1 via TRANSDERMAL
  Filled 2023-11-13: qty 1

## 2023-11-13 MED ORDER — LOPERAMIDE HCL 2 MG PO CAPS
2.0000 mg | ORAL_CAPSULE | Freq: Every day | ORAL | Status: DC | PRN
  Filled 2023-11-13: qty 1

## 2023-11-13 MED ORDER — SIMVASTATIN 20 MG PO TABS
20.0000 mg | ORAL_TABLET | Freq: Every day | ORAL | Status: DC
  Administered 2023-11-13 – 2023-11-17 (×5): 20 mg via ORAL
  Filled 2023-11-13 (×5): qty 1

## 2023-11-13 MED ORDER — SODIUM CHLORIDE 0.9% FLUSH
3.0000 mL | Freq: Two times a day (BID) | INTRAVENOUS | Status: DC
  Administered 2023-11-13 – 2023-11-17 (×7): 3 mL via INTRAVENOUS

## 2023-11-13 NOTE — Progress Notes (Signed)
 TRH night cross cover note:   Per patient's request I have ordered a lidocaine  patch for the patient's left shoulder discomfort.    Eva Pore, DO Hospitalist

## 2023-11-13 NOTE — Consult Note (Addendum)
 Cardiology Consultation   Patient ID: [name] Ray MRN: [id number]; DOB: 1933/03/22  Admit date: 11/13/2023 Date of Consult: 11/13/2023  PCP:  Vernadine Charlie ORN, MD   [facility] Providers Cardiologist:  Ozell Fell, MD  Cardiology APP:  Lelon Glendia DASEN, PA-C     Patient Profile: [name] Ray is a 89 y.o. male with a hx of type 2 diabetes, hypertension, hyperlipidemia, aortic atherosclerosis, CKD stage IIIa, Mobitz type I AV block, carotid artery disease, and CAD s/p multiple stents.  Who is being seen 11/13/2023 for the evaluation of chest pain at the request of Rondell Smith.  History of Present Illness: [name] Ray is an 89 year old male with prior cardiac history listed below.  Had a prior MI in 2002 was treated with PCI.  Since then has had stents placed in LAD in 2004, proximal LAD and LCx and 2006.  A cardiac cath was done in 2011 and showed patent stents.  A stress Myoview  was done on 07/2018 and was low risk.  Most recent echo prior to admission was done on 06/2020 and showed normal LVEF of 55 to 60%, no RWMA, mild LVH, normal RV systolic function, and grossly normal valve function.  On 02/2021 patient wore a cardiac monitor that showed occasional runs of Mobitz type I.  No other arrhythmias, atrial fibrillation, atrial flutter, or high degree heart block was seen.  Patient has a history of carotid artery stenosis that was initially discovered back in 2016.  He gets yearly carotid artery duplex to monitor.  The most recent one was done on 08/2023 and showed 1 to 39% stenosis in the left and right ICA, and normal flow in the left and right subclavian's.  He was last seen in the clinic by Dr. Fell on 08/2023.  The patient was doing well and able to go to the gym 3 days a week.   On 08/2023 the patient had a fall and fractured his third lumbar vertebrae.  On 11/13/2023 the patient presented to the emergency department complaining of chest pain.  Patient  and his wife were present for the interview. On interview the patient reported that the chest pain is sharp and worse with inspiration.  Chest pain radiates to shoulders.  Nitroglycerin  did not help the pain.  Has been less active than usual over the past few months due to the recent fall and back pain related to the fall.  Denies snoring or other symptoms of OSA.  Denies dyspnea on exertion, shortness of breath, diaphoresis, nausea, vomiting, fever, congestion, and chills.  Denies any tobacco use, alcohol  use, illicit substance use.  Labs showed high-sensitivity troponins 38> 33, elevated D-dimer of 0.97, hyperkalemia of 5.5, sodium of 136, creatinine of 1.27.  Appears to be about baseline, and anemia with a hemoglobin of 11.5.  Pulmonary CTA showed no PE, no acute cardiopulmonary disease, aortic atherosclerosis, CAD, and minimal bibasilar pleural fluid.   Past Medical History:  Diagnosis Date   Adenomatous polyp    history   Arthritis    knees , shoulders, hands    Atrial bigeminy    in the past   BPH (benign prostatic hyperplasia)    Carotid artery disease    a. Doppler, January, 2012, 40-59% bilateral stenoses;  b.  Carotids 4/14:  40-59% bilat ICA, occluded R vertebral artery; f/u 1 year // Carotid US  07/2018:  Bilat ICA 1-39; R subclavian stenosis >> rpt in 1 year  // Carotid US  07/2019: Bilateral ICA 40-59; right subclavian  stenosis; repeat 1 year // Carotid US  7/22: LICA 40-59, R subcl stenosis.//Carotid US  07/2021: R 1-39, L 40-59   Colon polyps    Coronary artery disease    acute inferior MI Jan 2002 with stent / Taxus stent LAD 2004 / Cypher stent prox LAD and cypher stent mid circ May 2006 / nuclear stress Dec 2009 normal EF 63% / nuclear August 2011 EF 62% / inferior thining and very mild ischemia inferior and apex / catherization Sept 2011 EF 60%  mild to moderate diffuse RCA disease, moderate discrete LAD stenosis before the stent in the proximal LAD med tx rec   Diverticulosis     Dyslipidemia    Ejection fraction    Fatty liver    history   GERD (gastroesophageal reflux disease)    Hypertension    [facility] - 5 stents total , last cardiac cath. 2011.  Cleared for  surgery by Dr. Micky- 04/23/2013   IBS (irritable bowel syndrome)    Myocardial infarction (HCC) 2002   Myoview     Myoview  07/2018: EF 63, inferior artifact, no ischemia or scar; Low Risk   Numbness and tingling in hands    resolved   Overweight(278.02)    Prostate cancer (HCC) approx 2016   treated /w  Lupron injection x1, then radium   Shortness of breath dyspnea    Type II diabetes mellitus (HCC)     Past Surgical History:  Procedure Laterality Date   CARDIAC CATHETERIZATION     last cath- 2006   COLONOSCOPY     CORONARY ANGIOPLASTY WITH STENT PLACEMENT     multiple stents in LAD:  I've got 5 stents (05/24/2013)   EYE SURGERY Bilateral    cataract surgery with lens implants   LUMBAR LAMINECTOMY/DECOMPRESSION MICRODISCECTOMY N/A 09/11/2017   Procedure: LAMINECTOMY AND FORAMINOTOMY LUMBAR One-Two, Two-Three, Three-Four, Four-Five, Five-Sacral One;  Surgeon: Mavis Purchase, MD;  Location: [facility] OR;  Service: Neurosurgery;  Laterality: N/A;  LAMINECTOMY AND FORAMINOTOMY LUMBAR One-Two,Two-Three, Three-Four, Four-Five, Five-Sacral One   NEUROPLASTY / TRANSPOSITION MEDIAN NERVE AT CARPAL TUNNEL Left    also surgery on thumb, elbow- nerve release    PROSTATE BIOPSY     followed later by [facility]   TOTAL HIP ARTHROPLASTY Left 01/26/2018   Procedure: TOTAL HIP ARTHROPLASTY ANTERIOR APPROACH;  Surgeon: Melodi Lerner, MD;  Location: [facility] ORS;  Service: Orthopedics;  Laterality: Left;    TOTAL KNEE ARTHROPLASTY Right 05/24/2013   TOTAL KNEE ARTHROPLASTY Right 05/24/2013   Procedure: RIGHT TOTAL KNEE ARTHROPLASTY;  Surgeon: Marcey Raman, MD;  Location: [facility] OR;  Service: Orthopedics;  Laterality: Right;   TOTAL KNEE ARTHROPLASTY Left 02/14/2014   Procedure: LEFT TOTAL KNEE ARTHROPLASTY;  Surgeon: Marcey Raman, MD;  Location: [facility] OR;  Service: Orthopedics;  Laterality: Left;     Home Medications:  Prior to Admission medications   Medication Sig Start Date End Date Taking? Authorizing Provider  acetaminophen  (TYLENOL ) 500 MG tablet Take 1,000 mg by mouth every 6 (six) hours as needed for mild pain (pain score 1-3).   Yes [provider]  clopidogrel  (PLAVIX ) 75 MG tablet Take 75 mg by mouth daily.   Yes [provider]  Cyanocobalamin  (VITAMIN B-12 PO) Take 1 tablet by mouth daily.   Yes [provider]  JENTADUETO  XR 2.05-998 MG TB24 Take 1 tablet by mouth daily. 02/12/18  Yes [provider]  lidocaine  (HM LIDOCAINE  PATCH) 4 % Place 1 patch onto the skin daily as needed (pain).  Yes [provider]  loperamide  (IMODIUM  A-D) 2 MG tablet Take 2 mg by mouth daily as needed for diarrhea or loose stools.   Yes [provider]  loratadine  (CLARITIN ) 10 MG tablet Take 10 mg by mouth daily as needed for allergies.   Yes [provider]  Multiple Vitamin (MULTI-VITAMIN) tablet Take 1 tablet by mouth daily.   Yes [provider]  nitroGLYCERIN  (NITROSTAT ) 0.4 MG SL tablet Place 1 tablet (0.4 mg total) under the tongue every 5 (five) minutes as needed for chest pain. Please keep upcoming appt. Thank you 04/23/17  Yes Wonda Sharper, MD  oxyCODONE -acetaminophen  (PERCOCET/ROXICET) 5-325 MG tablet Take 1 tablet by mouth every 6 (six) hours as needed. 09/16/23  Yes [provider]  ramipril  (ALTACE ) 10 MG capsule Take 10 mg by mouth daily after breakfast.   Yes [provider]  simvastatin  (ZOCOR ) 20 MG tablet Take 20 mg by mouth at bedtime.   Yes [provider]  tamsulosin  (FLOMAX ) 0.4 MG CAPS capsule Take 0.4 mg by mouth daily after supper. 04/01/13  Yes [provider]    Scheduled Meds:  furosemide  40 mg Intravenous Once   sodium chloride  flush  3 mL Intravenous Q12H   Continuous Infusions:   heparin 1,200 Units/hr (11/13/23 0952)   PRN Meds: acetaminophen  **OR** acetaminophen , oxyCODONE -acetaminophen   Allergies:    Allergies  Allergen Reactions   Atorvastatin Other (See Comments)    SEVERE LEG PAIN     Rosuvastatin Other (See Comments)    JOINT PAIN       Morphine  Other (See Comments)    confusing   Clindamycin Rash and Other (See Comments)    PATIENT REFUSES    Social History:   Social History   Socioeconomic History   Marital status: Married    Spouse name: Not on file   Number of children: Not on file   Years of education: Not on file   Highest education level: Not on file  Occupational History   Not on file  Tobacco Use   Smoking status: Never   Smokeless tobacco: Never  Vaping Use   Vaping status: Never Used  Substance and Sexual Activity   Alcohol  use: No   Drug use: No   Sexual activity: Not Currently  Other Topics Concern   Not on file  Social History Narrative   Not on file   Social Drivers of Health   Financial Resource Strain: Not on file  Food Insecurity: No Food Insecurity (09/02/2023)   Hunger Vital Sign    Worried About Running Out of Food in the Last Year: Never true    Ran Out of Food in the Last Year: Never true  Transportation Needs: No Transportation Needs (09/02/2023)   PRAPARE - Administrator, Civil Service (Medical): No    Lack of Transportation (Non-Medical): No  Physical Activity: Not on file  Stress: Not on file  Social Connections: Socially Integrated (09/02/2023)   Social Connection and Isolation Panel    Frequency of Communication with Friends and Family: Three times a week    Frequency of Social Gatherings with Friends and Family: Three times a week    Attends Religious Services: More than 4 times per year    Active Member of Clubs or Organizations: Yes    Attends Banker Meetings: More than 4 times per year    Marital Status: Married  Catering Manager Violence: Not At Risk  (09/02/2023)   Humiliation, Afraid,  Rape, and Kick questionnaire    Fear of Current or Ex-Partner: No    Emotionally Abused: No    Physically Abused: No    Sexually Abused: No    Family History:    Family History  Problem Relation Age of Onset   Hypertension Father    Heart disease Father    Heart disease Brother    Heart attack Paternal Grandfather      ROS:  Please see the history of present illness.   All other ROS reviewed and negative.     Physical Exam/Data: Vitals:   11/13/23 1300 11/13/23 1330 11/13/23 1400 11/13/23 1445  BP: (!) 136/56 (!) 149/53 (!) 143/46 133/71  Pulse: (!) 57 66 (!) 56 (!) 57  Resp: 12 18 20 19   Temp:      TempSrc:      SpO2: 99% 97% 97% 99%  Weight:      Height:       No intake or output data in the 24 hours ending 11/13/23 1510    11/13/2023    6:30 AM 09/01/2023    7:31 PM 09/01/2023    8:36 AM  Last 3 Weights  Weight (lbs) 187 lb 6.3 oz 187 lb 6.3 oz 188 lb 6.4 oz  Weight (kg) 85 kg 85 kg 85.458 kg     Body mass index is 30.25 kg/m.  General:  Well nourished, well developed, in no acute distress HEENT: normal Neck: no JVD Vascular: No carotid bruits; Distal pulses 2+ bilaterally Cardiac:  normal S1, S2; RRR; no murmur  Lungs:  clear to auscultation bilaterally, no wheezing, rhonchi or rales  Abd: soft, nontender, no hepatomegaly  Ext: no edema Musculoskeletal:  No deformities. Skin: warm and dry  Neuro:  no focal abnormalities noted Psych:  Normal affect   EKG:  The EKG has not been released yet Telemetry:  Telemetry was personally reviewed and demonstrates: Atrial fibrillation with resting heart rates in the 50s to 60s  Relevant CV Studies: Echo pending  Laboratory Data: High Sensitivity Troponin:   Recent Labs  Lab 11/13/23 0634 11/13/23 0852  TROPONINIHS 38* 33*     Chemistry Recent Labs  Lab 11/13/23 0634  NA 136  K 5.5*  CL 98  CO2 26  GLUCOSE 122*  BUN 29*  CREATININE 1.27*  CALCIUM 9.2  GFRNONAA  54*  ANIONGAP 12    No results for input(s): PROT, ALBUMIN, AST, ALT, ALKPHOS, BILITOT in the last 168 hours. Lipids No results for input(s): CHOL, TRIG, HDL, LABVLDL, LDLCALC, CHOLHDL in the last 168 hours.  Hematology Recent Labs  Lab 11/13/23 0634  WBC 8.2  RBC 3.77*  HGB 11.5*  HCT 35.2*  MCV 93.4  MCH 30.5  MCHC 32.7  RDW 13.5  PLT 153   Thyroid No results for input(s): TSH, FREET4 in the last 168 hours.  BNPNo results for input(s): BNP, PROBNP in the last 168 hours.  DDimer  Recent Labs  Lab 11/13/23 9277  DDIMER 0.97*    Radiology/Studies:  ECHOCARDIOGRAM COMPLETE Result Date: 11/13/2023    ECHOCARDIOGRAM REPORT   Patient Name:   [name] Ray Date of Exam: 11/13/2023 Medical Rec #:  [id number]         Height:       66.0 in Accession #:    [id number]        Weight:       187.4 lb Date of Birth:  04/30/1933  BSA:          1.945 m Patient Age:    89 years          BP:           129/47 mmHg Patient Gender: M                 HR:           52 bpm. Exam Location:  Inpatient Procedure: 2D Echo, Cardiac Doppler and Color Doppler (Both Spectral and Color            Flow Doppler were utilized during procedure). Indications:    Chest pain R07.9  History:        Patient has prior history of Echocardiogram examinations, most                 recent 07/04/2020. Previous Myocardial Infarction and CAD,                 Arrythmias:AV Block Mobitz 1, Signs/Symptoms:Chest Pain,                 Dizziness/Lightheadedness and Shortness of Breath; Risk                 Factors:Diabetes and Hyperlipidemia.  Sonographer:    BERNARDA ROCKS Referring Phys: [id number] RONDELL A SMITH IMPRESSIONS  1. Left ventricular ejection fraction, by estimation, is 55 to 60%. The left ventricle has normal function. Left ventricular endocardial border not optimally defined to evaluate regional wall motion. There is severe left ventricular hypertrophy of the basal-septal segment. Left  ventricular diastolic function could not be evaluated.  2. Right ventricular systolic function is normal. The right ventricular size is normal. There is moderately elevated pulmonary artery systolic pressure. The estimated right ventricular systolic pressure is 45.7 mmHg.  3. The mitral valve is normal in structure. Trivial mitral valve regurgitation. No evidence of mitral stenosis.  4. The aortic valve is tricuspid. Aortic valve regurgitation is not visualized. Aortic valve sclerosis/calcification is present, without any evidence of aortic stenosis. Aortic valve area, by VTI measures 1.64 cm. Aortic valve mean gradient measures 4.0 mmHg. Aortic valve Vmax measures 1.45 m/s.  5. The inferior vena cava is normal in size with <50% respiratory variability, suggesting right atrial pressure of 8 mmHg. FINDINGS  Left Ventricle: Left ventricular ejection fraction, by estimation, is 55 to 60%. The left ventricle has normal function. Left ventricular endocardial border not optimally defined to evaluate regional wall motion. The left ventricular internal cavity size was normal in size. There is severe left ventricular hypertrophy of the basal-septal segment. Left ventricular diastolic function could not be evaluated. Right Ventricle: The right ventricular size is normal. No increase in right ventricular wall thickness. Right ventricular systolic function is normal. There is moderately elevated pulmonary artery systolic pressure. The tricuspid regurgitant velocity is 3.07 m/s, and with an assumed right atrial pressure of 8 mmHg, the estimated right ventricular systolic pressure is 45.7 mmHg. Left Atrium: Left atrial size was normal in size. Right Atrium: Right atrial size was normal in size. Pericardium: There is no evidence of pericardial effusion. Mitral Valve: The mitral valve is normal in structure. Trivial mitral valve regurgitation. No evidence of mitral valve stenosis. MV peak gradient, 6.0 mmHg. The mean mitral valve  gradient is 2.0 mmHg. Tricuspid Valve: The tricuspid valve is normal in structure. Tricuspid valve regurgitation is trivial. No evidence of tricuspid stenosis. Aortic Valve: The aortic valve is tricuspid. Aortic valve regurgitation is not visualized.  Aortic valve sclerosis/calcification is present, without any evidence of aortic stenosis. Aortic valve mean gradient measures 4.0 mmHg. Aortic valve peak gradient measures 8.4 mmHg. Aortic valve area, by VTI measures 1.64 cm. Pulmonic Valve: The pulmonic valve was normal in structure. Pulmonic valve regurgitation is not visualized. No evidence of pulmonic stenosis. Aorta: The aortic root is normal in size and structure. Venous: The inferior vena cava is normal in size with less than 50% respiratory variability, suggesting right atrial pressure of 8 mmHg. IAS/Shunts: No atrial level shunt detected by color flow Doppler.  LEFT VENTRICLE PLAX 2D LVIDd:         3.90 cm LVIDs:         2.80 cm LV PW:         1.00 cm LV IVS:        1.63 cm LVOT diam:     2.00 cm LV SV:         57 LV SV Index:   29 LVOT Area:     3.14 cm  LV Volumes (MOD) LV vol d, MOD A2C: 132.0 ml LV vol d, MOD A4C: 140.0 ml LV vol s, MOD A2C: 47.0 ml LV vol s, MOD A4C: 59.0 ml LV SV MOD A2C:     85.0 ml LV SV MOD A4C:     140.0 ml LV SV MOD BP:      87.1 ml RIGHT VENTRICLE            IVC RV Basal diam:  3.00 cm    IVC diam: 1.80 cm RV S prime:     8.92 cm/s TAPSE (M-mode): 1.6 cm RVSP:           40.7 mmHg LEFT ATRIUM             Index        RIGHT ATRIUM           Index LA diam:        3.90 cm 2.00 cm/m   RA Pressure: 3.00 mmHg LA Vol (A2C):   42.9 ml 22.05 ml/m  RA Area:     11.00 cm LA Vol (A4C):   48.5 ml 24.93 ml/m  RA Volume:   20.30 ml  10.44 ml/m LA Biplane Vol: 46.7 ml 24.01 ml/m  AORTIC VALVE                    PULMONIC VALVE AV Area (Vmax):    1.87 cm     PV Vmax:          1.01 m/s AV Area (Vmean):   1.60 cm     PV Peak grad:     4.1 mmHg AV Area (VTI):     1.64 cm     PR End Diast Vel:  0.90 msec AV Vmax:           145.00 cm/s AV Vmean:          94.000 cm/s AV VTI:            0.347 m AV Peak Grad:      8.4 mmHg AV Mean Grad:      4.0 mmHg LVOT Vmax:         86.40 cm/s LVOT Vmean:        47.800 cm/s LVOT VTI:          0.181 m LVOT/AV VTI ratio: 0.52  AORTA Ao Root diam: 2.70 cm Ao Asc diam:  3.10 cm MITRAL VALVE  TRICUSPID VALVE MV Area (PHT): 4.96 cm     TR Peak grad:   37.7 mmHg MV Area VTI:   1.90 cm     TR Vmax:        307.00 cm/s MV Peak grad:  6.0 mmHg     Estimated RAP:  3.00 mmHg MV Mean grad:  2.0 mmHg     RVSP:           40.7 mmHg MV Vmax:       1.22 m/s MV Vmean:      69.8 cm/s    SHUNTS MV Decel Time: 153 msec     Systemic VTI:  0.18 m MV E velocity: 136.00 cm/s  Systemic Diam: 2.00 cm MV A velocity: 51.40 cm/s MV E/A ratio:  2.65 [name] Bihari MD Electronically signed by [name] Bihari MD Signature Date/Time: 11/13/2023/2:28:29 PM    Final    CT Angio Chest PE W and/or Wo Contrast Result Date: 11/13/2023 CLINICAL DATA:  Chest pain with pleuritic component beginning this morning. Positive D-dimer. Evaluate for pulmonary embolism. EXAM: CT ANGIOGRAPHY CHEST WITH CONTRAST TECHNIQUE: Multidetector CT imaging of the chest was performed using the standard protocol during bolus administration of intravenous contrast. Multiplanar CT image reconstructions and MIPs were obtained to evaluate the vascular anatomy. RADIATION DOSE REDUCTION: This exam was performed according to the departmental dose-optimization program which includes automated exposure control, adjustment of the mA and/or kV according to patient size and/or use of iterative reconstruction technique. CONTRAST:  75mL OMNIPAQUE  IOHEXOL  350 MG/ML SOLN COMPARISON:  09/01/2023 FINDINGS: Cardiovascular: Heart is normal size. Calcified plaque over the left main and 3 vessel coronary arteries. Stent over the left anterior descending coronary artery. Stent over the right coronary artery. Thoracic aorta is normal caliber.  Calcified plaque over the descending thoracic aorta. Pulmonary arteries are well opacified without evidence of emboli. Remaining vascular structures are unremarkable. Mediastinum/Nodes: No mediastinal or hilar adenopathy. Remaining mediastinal structures are unremarkable. Lungs/Pleura: Lungs are adequately inflated. No focal lobar consolidation or significant effusion. Tiny amount of bilateral basilar pleural fluid. Subtle dependent bibasilar atelectatic change. Airways are normal. Upper Abdomen: Calcified plaque over the abdominal aorta. No acute findings in the visualized upper abdomen. Musculoskeletal: No focal abnormality. Review of the MIP images confirms the above findings. IMPRESSION: 1. No acute cardiopulmonary disease and no evidence of pulmonary embolism. 2. Aortic atherosclerosis. Atherosclerotic coronary artery disease with coronary stents as described. 3. Tiny amount of bilateral basilar pleural fluid with subtle dependent bibasilar atelectatic change. Aortic Atherosclerosis (ICD10-I70.0). Electronically Signed   By: Toribio Agreste M.D.   On: 11/13/2023 08:56   DG Chest 2 View Result Date: 11/13/2023 EXAM: 2 VIEW(S) XRAY OF THE CHEST 11/13/2023 06:52:00 AM COMPARISON: AP radiograph of the chest dated 09/01/2023. CLINICAL HISTORY: Central chest pain, worse with deep breath, 8/10, not relieved with nitroglycerin . History of MI, but current pain feels different. FINDINGS: LUNGS AND PLEURA: Azygous fissure is incidentally demonstrated. No focal pulmonary opacity. No pulmonary edema. No pleural effusion. No pneumothorax. HEART AND MEDIASTINUM: There is calcification within the aortic arch. No acute abnormality of the cardiac and mediastinal silhouettes. BONES AND SOFT TISSUES: No acute osseous abnormality. IMPRESSION: 1. No acute cardiopulmonary pathology related to the clinical history of chest pain. Electronically signed by: Evalene Coho MD 11/13/2023 06:57 AM EDT RP Workstation: [id number]      Assessment and Plan:  Jonothan Heberle is a 89 y.o. male with a hx of type 2 diabetes, hypertension, hyperlipidemia, aortic atherosclerosis, CKD stage  IIIa, Mobitz type I AV block, carotid artery disease, and CAD s/p multiple stents.  Who is being seen 11/13/2023 for the evaluation of chest pain at the request of Rondell Smith.  New onset atrial fibrillation CHA2DS2-VASc Score = 5 [CHF History: 0, HTN History: 1, Diabetes History: 1, Stroke History: 0, Vascular Disease History: 1, Age Score: 2, Gender Score: 0].  Therefore, the patient's annual risk of stroke is 7.2 %.    Denies any prior history of atrial fibrillation.  Denies any symptoms with atrial fibrillation Overall resting heart rates are well-controlled on telemetry in the 50s and 60s.  But heart rates do intermittently go into the 120s.  Ordered TSH and magnesium Continue IV heparin   Chest pain Elevated high-sensitivity troponins 38> 33 CAD s/p multiple stents Hyperlipidemia On interview the patient reported that the chest pain is sharp and worse with inspiration. Pulmonary CTA was negative for PE and no acute cardiopulmonary disease. May consider outpatient nuclear stress test. Ordered CRP and ESR Continue simvastatin  20 mg daily   Possible diastolic heart failure Pulmonary CTA showed no PE, no acute cardiopulmonary disease, aortic atherosclerosis, CAD, and minimal bibasilar pleural fluid. Echocardiogram showed an LVEF of 55 to 60%, no RWMA, severe LVH of basal septal segment, normal RV systolic function, grossly normal valve function, right atrial pressure of 8 mmHg. Ordered BMP Ordered 1 dose of 40 mg IV Lasix   Hypertension Blood pressure well-controlled most recent BP 133/71.  Hyperkalemia Potassium 5.5 Ordered IV Lasix  Otherwise management per primary  Risk Assessment/Risk Scores:     New [place]  Heart Association (NYHA) Functional Class NYHA Class II  CHA2DS2-VASc Score = 5  :1} This indicates a  7.2% annual risk of stroke. The patient's score is based upon: CHF History: 0 HTN History: 1 Diabetes History: 1 Stroke History: 0 Vascular Disease History: 1 Age Score: 2 Gender Score: 0        For questions or updates, please contact [facility] Please consult [email or web address] for contact info under     Signed, Morse Clause, PA-C  11/13/2023 3:10 PM

## 2023-11-13 NOTE — ED Provider Notes (Signed)
 [facility]MENT AT [facility] Provider Note   CSN: [id number] Arrival date & time: 11/13/23  9373     Patient presents with: Chest Pain   [name] Ray is a 89 y.o. male.   89 yo M with a chief complaint of chest pain.  This started this morning about 2 AM.  Described as a pressure goes into both of his shoulders.  Worse with deep breathing.  He felt like he was quite short of breath and the pain was a bit more severe this morning.  He took some nitroglycerin  and now feels a bit better.  Has a history of 5 stents that were placed.  He thinks this felt the same but was more intense.  He denied ever having pain with inspiration before.  Recent fall with L-spine fracture.  He denies trauma to the chest denies congestion or fever.  He has a chronic cough which he thinks is unchanged.   Chest Pain      Prior to Admission medications   Medication Sig Start Date End Date Taking? Authorizing Provider  acetaminophen  (TYLENOL ) 500 MG tablet Take 1,000 mg by mouth every 6 (six) hours as needed for mild pain (pain score 1-3).   Yes [provider]  clopidogrel  (PLAVIX ) 75 MG tablet Take 75 mg by mouth daily.   Yes [provider]  Cyanocobalamin  (VITAMIN B-12 PO) Take 1 tablet by mouth daily.   Yes [provider]  JENTADUETO  XR 2.05-998 MG TB24 Take 1 tablet by mouth daily. 02/12/18  Yes [provider]  lidocaine  (HM LIDOCAINE  PATCH) 4 % Place 1 patch onto the skin daily as needed (pain).   Yes [provider]  loperamide  (IMODIUM  A-D) 2 MG tablet Take 2 mg by mouth daily as needed for diarrhea or loose stools.   Yes [provider]  loratadine  (CLARITIN ) 10 MG tablet Take 10 mg by mouth daily as needed for allergies.   Yes [provider]  Multiple Vitamin (MULTI-VITAMIN) tablet Take 1 tablet by mouth daily.   Yes [provider]  nitroGLYCERIN  (NITROSTAT ) 0.4 MG SL tablet Place 1 tablet  (0.4 mg total) under the tongue every 5 (five) minutes as needed for chest pain. Please keep upcoming appt. Thank you 04/23/17  Yes Wonda Sharper, MD  oxyCODONE -acetaminophen  (PERCOCET/ROXICET) 5-325 MG tablet Take 1 tablet by mouth every 6 (six) hours as needed. 09/16/23  Yes [provider]  ramipril  (ALTACE ) 10 MG capsule Take 10 mg by mouth daily after breakfast.   Yes [provider]  simvastatin  (ZOCOR ) 20 MG tablet Take 20 mg by mouth at bedtime.   Yes [provider]  tamsulosin  (FLOMAX ) 0.4 MG CAPS capsule Take 0.4 mg by mouth daily after supper. 04/01/13  Yes [provider]    Allergies: Atorvastatin, Rosuvastatin, Morphine , and Clindamycin    Review of Systems  Cardiovascular:  Positive for chest pain.    Updated Vital Signs BP (!) 129/47   Pulse (!) 58   Temp 97.7 F (36.5 C) (Oral)   Resp (!) 24   Ht 5' 6 (1.676 m)   Wt 85 kg   SpO2 100%   BMI 30.25 kg/m   Physical Exam Vitals and nursing note reviewed.  Constitutional:      Appearance: He is well-developed.     Comments: pale  HENT:     Head: Normocephalic and atraumatic.  Eyes:     Pupils: Pupils are equal, round, and reactive to  light.  Neck:     Vascular: No JVD.  Cardiovascular:     Rate and Rhythm: Normal rate and regular rhythm.     Heart sounds: No murmur heard.    No friction rub. No gallop.  Pulmonary:     Effort: No respiratory distress.     Breath sounds: No wheezing.  Abdominal:     General: There is no distension.     Tenderness: There is no abdominal tenderness. There is no guarding or rebound.  Musculoskeletal:        General: Normal range of motion.     Cervical back: Normal range of motion and neck supple.  Skin:    Coloration: Skin is not pale.     Findings: No rash.  Neurological:     Mental Status: He is alert and oriented to person, place, and time.  Psychiatric:        Behavior: Behavior normal.     (all labs ordered are listed, but only  abnormal results are displayed) Labs Reviewed  BASIC METABOLIC PANEL WITH GFR - Abnormal; Notable for the following components:      Result Value   Potassium 5.5 (*)    Glucose, Bld 122 (*)    BUN 29 (*)    Creatinine, Ser 1.27 (*)    GFR, Estimated 54 (*)    All other components within normal limits  CBC - Abnormal; Notable for the following components:   RBC 3.77 (*)    Hemoglobin 11.5 (*)    HCT 35.2 (*)    All other components within normal limits  D-DIMER, QUANTITATIVE - Abnormal; Notable for the following components:   D-Dimer, Quant 0.97 (*)    All other components within normal limits  TROPONIN I (HIGH SENSITIVITY) - Abnormal; Notable for the following components:   Troponin I (High Sensitivity) 38 (*)    All other components within normal limits  TROPONIN I (HIGH SENSITIVITY) - Abnormal; Notable for the following components:   Troponin I (High Sensitivity) 33 (*)    All other components within normal limits  HEPARIN LEVEL (UNFRACTIONATED)    EKG: None  Radiology: CT Angio Chest PE W and/or Wo Contrast Result Date: 11/13/2023 CLINICAL DATA:  Chest pain with pleuritic component beginning this morning. Positive D-dimer. Evaluate for pulmonary embolism. EXAM: CT ANGIOGRAPHY CHEST WITH CONTRAST TECHNIQUE: Multidetector CT imaging of the chest was performed using the standard protocol during bolus administration of intravenous contrast. Multiplanar CT image reconstructions and MIPs were obtained to evaluate the vascular anatomy. RADIATION DOSE REDUCTION: This exam was performed according to the departmental dose-optimization program which includes automated exposure control, adjustment of the mA and/or kV according to patient size and/or use of iterative reconstruction technique. CONTRAST:  75mL OMNIPAQUE  IOHEXOL  350 MG/ML SOLN COMPARISON:  09/01/2023 FINDINGS: Cardiovascular: Heart is normal size. Calcified plaque over the left main and 3 vessel coronary arteries. Stent over the  left anterior descending coronary artery. Stent over the right coronary artery. Thoracic aorta is normal caliber. Calcified plaque over the descending thoracic aorta. Pulmonary arteries are well opacified without evidence of emboli. Remaining vascular structures are unremarkable. Mediastinum/Nodes: No mediastinal or hilar adenopathy. Remaining mediastinal structures are unremarkable. Lungs/Pleura: Lungs are adequately inflated. No focal lobar consolidation or significant effusion. Tiny amount of bilateral basilar pleural fluid. Subtle dependent bibasilar atelectatic change. Airways are normal. Upper Abdomen: Calcified plaque over the abdominal aorta. No acute findings in the visualized upper abdomen. Musculoskeletal: No focal abnormality. Review of the MIP images  confirms the above findings. IMPRESSION: 1. No acute cardiopulmonary disease and no evidence of pulmonary embolism. 2. Aortic atherosclerosis. Atherosclerotic coronary artery disease with coronary stents as described. 3. Tiny amount of bilateral basilar pleural fluid with subtle dependent bibasilar atelectatic change. Aortic Atherosclerosis (ICD10-I70.0). Electronically Signed   By: Toribio Agreste M.D.   On: 11/13/2023 08:56   DG Chest 2 View Result Date: 11/13/2023 EXAM: 2 VIEW(S) XRAY OF THE CHEST 11/13/2023 06:52:00 AM COMPARISON: AP radiograph of the chest dated 09/01/2023. CLINICAL HISTORY: Central chest pain, worse with deep breath, 8/10, not relieved with nitroglycerin . History of MI, but current pain feels different. FINDINGS: LUNGS AND PLEURA: Azygous fissure is incidentally demonstrated. No focal pulmonary opacity. No pulmonary edema. No pleural effusion. No pneumothorax. HEART AND MEDIASTINUM: There is calcification within the aortic arch. No acute abnormality of the cardiac and mediastinal silhouettes. BONES AND SOFT TISSUES: No acute osseous abnormality. IMPRESSION: 1. No acute cardiopulmonary pathology related to the clinical history of  chest pain. Electronically signed by: Evalene Coho MD 11/13/2023 06:57 AM EDT RP Workstation: [id number]     .Critical Care  Performed by: Emil Share, DO Authorized by: Emil Share, DO   Critical care provider statement:    Critical care time (minutes):  35   Critical care time was exclusive of:  Separately billable procedures and treating other patients   Critical care was time spent personally by me on the following activities:  Development of treatment plan with patient or surrogate, discussions with consultants, evaluation of patient's response to treatment, examination of patient, ordering and review of laboratory studies, ordering and review of radiographic studies, ordering and performing treatments and interventions, pulse oximetry, re-evaluation of patient's condition and review of old charts   Care discussed with: admitting provider      Medications Ordered in the ED  heparin ADULT infusion 100 units/mL (25000 units/250mL) (1,200 Units/hr Intravenous New Bag/Given 11/13/23 0952)  sodium chloride  flush (NS) 0.9 % injection 3 mL (has no administration in time range)  acetaminophen  (TYLENOL ) tablet 650 mg (has no administration in time range)    Or  acetaminophen  (TYLENOL ) suppository 650 mg (has no administration in time range)  iohexol  (OMNIPAQUE ) 350 MG/ML injection 75 mL (75 mLs Intravenous Contrast Given 11/13/23 0845)  heparin bolus via infusion 4,000 Units (4,000 Units Intravenous Bolus from Bag 11/13/23 0955)                                    Medical Decision Making Amount and/or Complexity of Data Reviewed Labs: ordered. Radiology: ordered. ECG/medicine tests: ordered.  Risk Prescription drug management. Decision regarding hospitalization.   89 yo M with a chief complaints of chest pain.  Started about 6 hours ago.  Pain pleuritic.  Patient with significant coronary history multiple MIs requiring stent placements in the past.  Chest x-ray on my independent  interpretation without focal infiltrate or pneumothorax.  No obvious acute anemia.  Mild hyperkalemia.  Troponin elevated above baseline.  D-dimer positive.  Will obtain a CT angiogram of the chest.  CT angiogram of the chest is negative for pulmonary embolism.  I discussed case with cardiology, Dr. Okey recommends medical admission and cardiology will formally consult.  I did discuss with her that he is newly in A-fib.  Recommended holding Plavix  and starting him on heparin.  The patients results and plan were reviewed and discussed.   Any x-rays performed were independently reviewed  by myself.   Differential diagnosis were considered with the presenting HPI.  Medications  heparin ADULT infusion 100 units/mL (25000 units/250mL) (1,200 Units/hr Intravenous New Bag/Given 11/13/23 0952)  sodium chloride  flush (NS) 0.9 % injection 3 mL (has no administration in time range)  acetaminophen  (TYLENOL ) tablet 650 mg (has no administration in time range)    Or  acetaminophen  (TYLENOL ) suppository 650 mg (has no administration in time range)  iohexol  (OMNIPAQUE ) 350 MG/ML injection 75 mL (75 mLs Intravenous Contrast Given 11/13/23 0845)  heparin bolus via infusion 4,000 Units (4,000 Units Intravenous Bolus from Bag 11/13/23 0955)    Vitals:   11/13/23 0630 11/13/23 0700 11/13/23 0745  BP:  (!) 137/48 (!) 129/47  Pulse:  (!) 45 (!) 58  Resp:  16 (!) 24  Temp: 97.7 F (36.5 C)    TempSrc: Oral    SpO2:  100% 100%  Weight: 85 kg    Height: 5' 6 (1.676 m)      Final diagnoses:  Chest pain with moderate risk for cardiac etiology  Atrial fibrillation, unspecified type [facility])    Admission/ observation were discussed with the admitting physician, patient and/or family and they are comfortable with the plan.       Final diagnoses:  Chest pain with moderate risk for cardiac etiology  Atrial fibrillation, unspecified type [facility])    ED Discharge Orders     None          Emil Share, DO 11/13/23 1004

## 2023-11-13 NOTE — H&P (Signed)
 History and Physical    Patient: [name] Ray FMW:[id number] DOB: 06-16-1933 DOA: 11/13/2023 DOS: the patient was seen and examined on 11/13/2023 PCP: Tisovec, Charlie ORN, MD  Patient coming from: Home  Chief Complaint:  Chief Complaint  Patient presents with   Chest Pain   HPI: [name] Ray is a 89 y.o. male with medical history significant of HTN, dyslipidemia, CAD, diabetes mellitus type 2, and GERD , presents with chest pain and shortness of breath. He is accompanied by his wife.  He experienced chest pain that began around 12:30 AM, initially felt across his chest and radiating down towards his stomach. The pain intensified to a level of 8 or 9 out of 10, prompting him to take nitroglycerin , which did not alleviate the pain. He experienced shortness of breath during the episode. No nausea or diaphoresis was noted. The pain also radiated across his shoulders, and he had difficulty taking deep breaths, which exacerbated the pain.  He has a history of a back injury from a fall on September 01, 2023, resulting in a vertebrae fracture. He was hospitalized for a couple of days following the injury. He has been experiencing numbness and a sensation of swelling in his right leg and foot, which he attributes to a previous back surgery in 2018. He has been taking oxycodone  for back pain and previously took gabapentin, which he discontinued three weeks ago as it was not effective. He reports that morphine  made him sick and 'zonked him out'.  He does not use CPAP or BiPAP for sleep apnea. He has been sleeping in a recliner after his back injury and only recently returned to sleeping in bed.   In the ED patient was noted to be afebrile with pulse 45-58, respirations 16-24, and all other vital signs maintained.Labs significant for sensitivity troponin 38-> 33, D-dimer 0.97, potassium 5.5, BUN 29, and creatinine 1.27.  Chest x-ray noted no acute abnormality.  CT scan of the chest abdomen pelvis  noted no acute cardiopulmonary disease or evidence of pulmonary embolism.  There was noted of aortic atherosclerosis with atherosclerotic coronary artery disease with stents present and tiny amount of bilateral bibasilar pleural fluid with atelectasis.  Cardiology has been formally consulted.   As mentioned in the history of present illness. All other systems reviewed and are negative. Review of Systems:  Past Medical History:  Diagnosis Date   Adenomatous polyp    history   Arthritis    knees , shoulders, hands    Atrial bigeminy    in the past   BPH (benign prostatic hyperplasia)    Carotid artery disease    a. Doppler, January, 2012, 40-59% bilateral stenoses;  b.  Carotids 4/14:  40-59% bilat ICA, occluded R vertebral artery; f/u 1 year // Carotid US  07/2018:  Bilat ICA 1-39; R subclavian stenosis >> rpt in 1 year  // Carotid US  07/2019: Bilateral ICA 40-59; right subclavian stenosis; repeat 1 year // Carotid US  7/22: LICA 40-59, R subcl stenosis.//Carotid US  07/2021: R 1-39, L 40-59   Colon polyps    Coronary artery disease    acute inferior MI Jan 2002 with stent / Taxus stent LAD 2004 / Cypher stent prox LAD and cypher stent mid circ May 2006 / nuclear stress Dec 2009 normal EF 63% / nuclear August 2011 EF 62% / inferior thining and very mild ischemia inferior and apex / catherization Sept 2011 EF 60%  mild to moderate diffuse RCA disease, moderate discrete LAD stenosis before the  stent in the proximal LAD med tx rec   Diverticulosis    Dyslipidemia    Ejection fraction    Fatty liver    history   GERD (gastroesophageal reflux disease)    Hypertension    [facility] - 5 stents total , last cardiac cath. 2011.  Cleared for  surgery by Dr. Micky- 04/23/2013   IBS (irritable bowel syndrome)    Myocardial infarction (HCC) 2002   Myoview     Myoview  07/2018: EF 63, inferior artifact, no ischemia or scar; Low Risk   Numbness and tingling in hands    resolved   Overweight(278.02)     Prostate cancer (HCC) approx 2016   treated /w  Lupron injection x1, then radium   Shortness of breath dyspnea    Type II diabetes mellitus (HCC)    Past Surgical History:  Procedure Laterality Date   CARDIAC CATHETERIZATION     last cath- 2006   COLONOSCOPY     CORONARY ANGIOPLASTY WITH STENT PLACEMENT     multiple stents in LAD:  I've got 5 stents (05/24/2013)   EYE SURGERY Bilateral    cataract surgery with lens implants   LUMBAR LAMINECTOMY/DECOMPRESSION MICRODISCECTOMY N/A 09/11/2017   Procedure: LAMINECTOMY AND FORAMINOTOMY LUMBAR One-Two, Two-Three, Three-Four, Four-Five, Five-Sacral One;  Surgeon: Mavis Purchase, MD;  Location[facility];  Service: Neurosurgery;  Laterality: N/A;  LAMINECTOMY AND FORAMINOTOMY LUMBAR One-Two,Two-Three, Three-Four, Four-Five, Five-Sacral One   NEUROPLASTY / TRANSPOSITION MEDIAN NERVE AT CARPAL TUNNEL Left    also surgery on thumb, elbow- nerve release    PROSTATE BIOPSY     followed later by [facility]DR   TOTAL HIP ARTHROPLASTY Left 01/26/2018   Procedure: TOTAL HIP ARTHROPLASTY ANTERIOR APPROACH;  Surgeon: Melodi Lerner, MD;  Location: [facility] OR[facility];  Service: Orthopedics;  Laterality: Left;    TOTAL KNEE ARTHROPLASTY Right 05/24/2013   TOTAL KNEE ARTHROPLASTY Right 05/24/2013   Procedure: RIGHT TOTAL KNEE ARTHROPLASTY;  Surgeon: Marcey Raman, MD;  Location: [facility] OR;  Service: Orthopedics;  Laterality: Right;   TOTAL KNEE ARTHROPLASTY Left 02/14/2014   Procedure: LEFT TOTAL KNEE ARTHROPLASTY;  Surgeon: Marcey Raman, MD;  Location: [facility] OR;  Service: Orthopedics;  Laterality: Left;   Social History:  reports that he has never smoked. He has never used smokeless tobacco. He reports that he does not drink alcohol  and does not use drugs.  Allergies  Allergen Reactions   Atorvastatin Other (See Comments)    SEVERE LEG PAIN     Rosuvastatin Other (See Comments)    JOINT PAIN       Morphine  Other (See Comments)    confusing   Clindamycin Rash and Other (See  Comments)    PATIENT REFUSES    Family History  Problem Relation Age of Onset   Hypertension Father    Heart disease Father    Heart disease Brother    Heart attack Paternal Grandfather     Prior to Admission medications   Medication Sig Start Date End Date Taking? Authorizing Provider  acetaminophen  (TYLENOL ) 500 MG tablet Take 1,000 mg by mouth every 6 (six) hours as needed for mild pain (pain score 1-3).   Yes [provider]  clopidogrel  (PLAVIX ) 75 MG tablet Take 75 mg by mouth daily.   Yes [provider]  Cyanocobalamin  (VITAMIN B-12 PO) Take 1 tablet by mouth daily.   Yes [provider]  JENTADUETO  XR 2.05-998 MG TB24 Take 1 tablet by mouth daily. 02/12/18  Yes [provider]  lidocaine  (HM LIDOCAINE  PATCH) 4 % Place 1 patch onto the skin daily as needed (pain).   Yes [provider]  loperamide  (IMODIUM  A-D) 2 MG tablet Take 2 mg by mouth daily as needed for diarrhea or loose stools.   Yes [provider]  loratadine  (CLARITIN ) 10 MG tablet Take 10 mg by mouth daily as needed for allergies.   Yes [provider]  Multiple Vitamin (MULTI-VITAMIN) tablet Take 1 tablet by mouth daily.   Yes [provider]  nitroGLYCERIN  (NITROSTAT ) 0.4 MG SL tablet Place 1 tablet (0.4 mg total) under the tongue every 5 (five) minutes as needed for chest pain. Please keep upcoming appt. Thank you 04/23/17  Yes Wonda, Ozell, MD  oxyCODONE -acetaminophen  (PERCOCET/ROXICET) 5-325 MG tablet Take 1 tablet by mouth every 6 (six) hours as needed. 09/16/23  Yes [provider]  ramipril  (ALTACE ) 10 MG capsule Take 10 mg by mouth daily after breakfast.   Yes [provider]  simvastatin  (ZOCOR ) 20 MG tablet Take 20 mg by mouth at bedtime.   Yes [provider]  tamsulosin  (FLOMAX ) 0.4 MG CAPS capsule Take 0.4 mg by mouth daily after supper. 04/01/13  Yes [provider]    Physical Exam: Vitals:    11/13/23 0630 11/13/23 0700 11/13/23 0745  BP:  (!) 137/48 (!) 129/47  Pulse:  (!) 45 (!) 58  Resp:  16 (!) 24  Temp: 97.7 F (36.5 C)    TempSrc: Oral    SpO2:  100% 100%  Weight: 85 kg    Height: 5' 6 (1.676 m)      Constitutional: Early male currently no acute distress Eyes: PERRL, lids and conjunctivae normal.  Wearing glasses. ENMT: Mucous membranes are moist.  .Normal dentition.  Neck: normal, supple,  Respiratory: clear to auscultation bilaterally, no wheezing, no crackles. Normal respiratory effort. No accessory muscle use.  Cardiovascular: Irregular, irregular.  Has lower extremity edema.  2+ pedal pulses.   Abdomen: no tenderness, no masses palpated. No hepatosplenomegaly. Bowel sounds positive.  Musculoskeletal: no clubbing / cyanosis. No joint deformity upper and lower extremities. Good ROM, no contractures. Normal muscle tone.  Skin: no rashes, lesions, ulcers. No induration Neurologic: CN 2-12 grossly intact. Sensation intact, DTR normal. Strength 5/5 in all 4.  Psychiatric: Normal judgment and insight. Alert and oriented x 3. Normal mood.   Data Reviewed:  Reviewed labs, imaging, and pertinent records as documented.  Assessment and Plan:  Chest pain Elevated troponin Patient presented with complaints of chest pain reports of radiation into his stomach.  High-sensitivity troponin 38-> 33.    Chest x-ray did not noted acute abnormality. D-dimer was noted to be elevated at 0.97.  CT angiogram of the chest did not reveal any acute signs for pulmonary embolism but did note aortic atherosclerosis with atherosclerotic coronary artery disease with coronary stent is present.  Patient had taken nitroglycerin  prior to arrival. - Admit to a cardiac telemetry bed  - Check echocardiogram - Cardiology consulted we will follow-up for any further recommendations  New onset atrial fibrillation Patient found to be in atrial fibrillation with slow ventricular rate.  Patient not on  any rate controlling medications.  He was started on heparin drip per pharmacy. CHA2DS2-VASc score equal to at least 5 (based off age, HTN, CAD, DM) - Goal potassium at least 4 and magnesium at least 2 - Continue heparin drip per pharmacy  CAD Patient with a history of multiple stents. - Continue Plavix  and statin  Diastolic  congestive heart failure Patient appears to be fairly euvolemic on physical exam at this time.  CTA of the chest tiny amount of bilateral basilar pleural effusion with subtle dependent bibasilar atelectatic changes.  Last echocardiogram noted EF to be 55 to 60% with grade 1 diastolic dysfunction back in 06/2020. - Strict I&O's and daily weights - Check BP - Lasix 40 mg IV given x 1 dose  Hyperkalemia Acute.  Initial potassium noted to be elevated at 5.5.  Continue to monitor - Continue to monitor  Controlled diabetes mellitus type 2, without long-term use of insulin  On admission glucose noted to be 122.  Last available hemoglobin A1c noted to be 5.6 when checked on 09/02/2023. - Hypoglycemic protocols - Hold linagliptin -metformin   Chronic kidney disease stage IIIa Creatinine noted to be 1.27 with BUN 29.  Kidney function appears to be near patient's baseline. - Continue to monitor  Normocytic anemia Hemoglobin 11.5 which appears improved from prior. - Continue to monitor  Hyperlipidemia - Check lipid panel - Continue simvastatin   DVT prophylaxis: Heparin Advance Care Planning:   Code Status: Full Code    Consults: Cardiology  Family Communication: Wife updated at bedside  Severity of Illness: The appropriate patient status for this patient is OBSERVATION. Observation status is judged to be reasonable and necessary in order to provide the required intensity of service to ensure the patient's safety. The patient's presenting symptoms, physical exam findings, and initial radiographic and laboratory data in the context of their medical condition is felt to  place them at decreased risk for further clinical deterioration. Furthermore, it is anticipated that the patient will be medically stable for discharge from the hospital within 2 midnights of admission.   Author: Maximino DELENA Sharps, MD 11/13/2023 9:43 AM  For on call review [email or web address].

## 2023-11-13 NOTE — ED Notes (Signed)
 RN attempted to give pain medication. Beside testing being preformed.

## 2023-11-13 NOTE — ED Notes (Signed)
 CCMD called.

## 2023-11-13 NOTE — ED Triage Notes (Addendum)
 At 3am pt began having central chest pain that is worse with deep breath. 8/10 not relieved with nitroglycerin  x2. Pain does not radiate. Ems gave 324 asa. Pt has hx of MI but states this does not feel the same. Bp 144/72 hr 68 100% ra. Pt is on plavix 

## 2023-11-13 NOTE — Care Management Obs Status (Signed)
 MEDICARE OBSERVATION STATUS NOTIFICATION   Patient Details  Name: [name] Ray MRN: [id number] Date of Birth: [birth year]   Medicare Observation Status Notification Given:  Other (see comment)    Claretta Deed verbally reviewed observation notice with [name] Bologna telephonically at [phone].  Will mail a copy of this notice to the patient home address. , 1:34 PM

## 2023-11-13 NOTE — Plan of Care (Signed)
   Problem: Education: Goal: Knowledge of General Education information will improve Description Including pain rating scale, medication(s)/side effects and non-pharmacologic comfort measures Outcome: Progressing

## 2023-11-13 NOTE — Progress Notes (Signed)
 PHARMACY - ANTICOAGULATION CONSULT NOTE  Pharmacy Consult for Heparin Indication: atrial fibrillation  Allergies  Allergen Reactions   Atorvastatin Other (See Comments)    SEVERE LEG PAIN     Rosuvastatin Other (See Comments)    JOINT PAIN       Morphine  Other (See Comments)    confusing   Clindamycin Rash and Other (See Comments)    PATIENT REFUSES    Patient Measurements: Height: 5' 6 (167.6 cm) Weight: 85 kg (187 lb 6.3 oz) IBW/kg (Calculated) : 63.8 HEPARIN DW (KG): 81.3  Vital Signs: Temp: 97.7 F (36.5 C) (10/30 0630) Temp Source: Oral (10/30 0630) BP: 129/47 (10/30 0745) Pulse Rate: 58 (10/30 0745)  Labs: Recent Labs    11/13/23 0634  HGB 11.5*  HCT 35.2*  PLT 153  CREATININE 1.27*  TROPONINIHS 38*    Estimated Creatinine Clearance: 41.1 mL/min (A) (by C-G formula based on SCr of 1.27 mg/dL (H)).   Medical History: Past Medical History:  Diagnosis Date   Adenomatous polyp    history   Arthritis    knees , shoulders, hands    Atrial bigeminy    in the past   BPH (benign prostatic hyperplasia)    Carotid artery disease    a. Doppler, January, 2012, 40-59% bilateral stenoses;  b.  Carotids 4/14:  40-59% bilat ICA, occluded R vertebral artery; f/u 1 year // Carotid US  07/2018:  Bilat ICA 1-39; R subclavian stenosis >> rpt in 1 year  // Carotid US  07/2019: Bilateral ICA 40-59; right subclavian stenosis; repeat 1 year // Carotid US  7/22: LICA 40-59, R subcl stenosis.//Carotid US  07/2021: R 1-39, L 40-59   Colon polyps    Coronary artery disease    acute inferior MI Jan 2002 with stent / Taxus stent LAD 2004 / Cypher stent prox LAD and cypher stent mid circ May 2006 / nuclear stress Dec 2009 normal EF 63% / nuclear August 2011 EF 62% / inferior thining and very mild ischemia inferior and apex / catherization Sept 2011 EF 60%  mild to moderate diffuse RCA disease, moderate discrete LAD stenosis before the stent in the proximal LAD med tx rec    Diverticulosis    Dyslipidemia    Ejection fraction    Fatty liver    history   GERD (gastroesophageal reflux disease)    Hypertension    [facility] - 5 stents total , last cardiac cath. 2011.  Cleared for  surgery by Dr. Micky- 04/23/2013   IBS (irritable bowel syndrome)    Myocardial infarction (HCC) 2002   Myoview     Myoview  07/2018: EF 63, inferior artifact, no ischemia or scar; Low Risk   Numbness and tingling in hands    resolved   Overweight(278.02)    Prostate cancer (HCC) approx 2016   treated /w  Lupron injection x1, then radium   Shortness of breath dyspnea    Type II diabetes mellitus (HCC)     Medications:  See electronic med rec  Assessment: 89 y.o. M presents with afib. To begin heparin. No AC PTA. Hgb 11.5, plt wnl.  Goal of Therapy:  Heparin level 0.3-0.7 units/ml Monitor platelets by anticoagulation protocol: Yes   Plan:  Heparin IV bolus 4000 units Heparin gtt at 1200 units/hr Will f/u heparin level in 8 hours Daily heparin level and CBC  Vito Ralph, PharmD, BCPS Please see amion for complete clinical pharmacist phone list 11/13/2023,9:35 AM

## 2023-11-13 NOTE — Progress Notes (Signed)
 PHARMACY - ANTICOAGULATION CONSULT NOTE  Pharmacy Consult for Heparin Indication: atrial fibrillation  Allergies  Allergen Reactions   Atorvastatin Other (See Comments)    SEVERE LEG PAIN     Rosuvastatin Other (See Comments)    JOINT PAIN       Morphine  Other (See Comments)    confusing   Clindamycin Rash and Other (See Comments)    PATIENT REFUSES    Patient Measurements: Height: 5' 6 (167.6 cm) Weight: 86.5 kg (190 lb 11.2 oz) IBW/kg (Calculated) : 63.8 HEPARIN DW (KG): 81.8  Vital Signs: Temp: 98.7 F (37.1 C) (10/30 1445) Temp Source: Oral (10/30 1445) BP: 133/71 (10/30 1445) Pulse Rate: 57 (10/30 1445)  Labs: Recent Labs    11/13/23 0634 11/13/23 0852 11/13/23 1810  HGB 11.5*  --   --   HCT 35.2*  --   --   PLT 153  --   --   HEPARINUNFRC  --   --  0.41  CREATININE 1.27*  --   --   TROPONINIHS 38* 33*  --     Estimated Creatinine Clearance (by C-G formula based on SCr of 1.27 mg/dL (H)) Male: 66.0 mL/min (A) Male: 41.5 mL/min (A)   Medical History: Past Medical History:  Diagnosis Date   Adenomatous polyp    history   Arthritis    knees , shoulders, hands    Atrial bigeminy    in the past   BPH (benign prostatic hyperplasia)    Carotid artery disease    a. Doppler, January, 2012, 40-59% bilateral stenoses;  b.  Carotids 4/14:  40-59% bilat ICA, occluded R vertebral artery; f/u 1 year // Carotid US  07/2018:  Bilat ICA 1-39; R subclavian stenosis >> rpt in 1 year  // Carotid US  07/2019: Bilateral ICA 40-59; right subclavian stenosis; repeat 1 year // Carotid US  7/22: LICA 40-59, R subcl stenosis.//Carotid US  07/2021: R 1-39, L 40-59   Colon polyps    Coronary artery disease    acute inferior MI Jan 2002 with stent / Taxus stent LAD 2004 / Cypher stent prox LAD and cypher stent mid circ May 2006 / nuclear stress Dec 2009 normal EF 63% / nuclear August 2011 EF 62% / inferior thining and very mild ischemia inferior and apex / catherization Sept  2011 EF 60%  mild to moderate diffuse RCA disease, moderate discrete LAD stenosis before the stent in the proximal LAD med tx rec   Diverticulosis    Dyslipidemia    Ejection fraction    Fatty liver    history   GERD (gastroesophageal reflux disease)    Hypertension    [facility] - 5 stents total , last cardiac cath. 2011.  Cleared for  surgery by Dr. Micky- 04/23/2013   IBS (irritable bowel syndrome)    Myocardial infarction [facility]) 2002   Myoview     Myoview  07/2018: EF 63, inferior artifact, no ischemia or scar; Low Risk   Numbness and tingling in hands    resolved   Overweight(278.02)    Prostate cancer (HCC) approx 2016   treated /w  Lupron injection x1, then radium   Shortness of breath dyspnea    Type II diabetes mellitus (HCC)     Assessment: 89 y.o. M presents with afib. To begin heparin. No AC PTA. Hgb 11.5, plt wnl.  10/30 PM: Heparin level therapeutic at 0.41. Continue dosing.  Goal of Therapy:  Heparin level 0.3-0.7 units/ml Monitor platelets by anticoagulation protocol: Yes   Plan:  Continue Heparin gtt at 1200 units/hr Daily heparin level and CBC  [name], PharmD Clinical Pharmacist 11/13/2023  7:40 PM **Pharmacist phone directory can now be found on [email or web address] ([email or web address]).  Listed under [facility].

## 2023-11-14 ENCOUNTER — Other Ambulatory Visit (HOSPITAL_COMMUNITY): Payer: Self-pay

## 2023-11-14 ENCOUNTER — Telehealth (HOSPITAL_COMMUNITY): Payer: Self-pay

## 2023-11-14 DIAGNOSIS — I2489 Other forms of acute ischemic heart disease: Secondary | ICD-10-CM | POA: Diagnosis not present

## 2023-11-14 DIAGNOSIS — I5031 Acute diastolic (congestive) heart failure: Secondary | ICD-10-CM | POA: Diagnosis not present

## 2023-11-14 DIAGNOSIS — I4891 Unspecified atrial fibrillation: Secondary | ICD-10-CM | POA: Diagnosis not present

## 2023-11-14 DIAGNOSIS — N1831 Chronic kidney disease, stage 3a: Secondary | ICD-10-CM | POA: Diagnosis not present

## 2023-11-14 DIAGNOSIS — R079 Chest pain, unspecified: Secondary | ICD-10-CM | POA: Diagnosis not present

## 2023-11-14 DIAGNOSIS — I2510 Atherosclerotic heart disease of native coronary artery without angina pectoris: Secondary | ICD-10-CM | POA: Diagnosis not present

## 2023-11-14 LAB — BASIC METABOLIC PANEL WITH GFR
Anion gap: 15 (ref 5–15)
BUN: 26 mg/dL — ABNORMAL HIGH (ref 8–23)
CO2: 22 mmol/L (ref 22–32)
Calcium: 8.9 mg/dL (ref 8.9–10.3)
Chloride: 96 mmol/L — ABNORMAL LOW (ref 98–111)
Creatinine, Ser: 1.3 mg/dL — ABNORMAL HIGH (ref 0.61–1.24)
GFR, Estimated: 53 mL/min — ABNORMAL LOW (ref >60)
Glucose, Bld: 144 mg/dL — ABNORMAL HIGH (ref 70–99)
Potassium: 4.6 mmol/L (ref 3.5–5.1)
Sodium: 133 mmol/L — ABNORMAL LOW (ref 135–145)

## 2023-11-14 LAB — CBC
HCT: 33.4 % — ABNORMAL LOW (ref 39.0–52.0)
Hemoglobin: 10.9 g/dL — ABNORMAL LOW (ref 13.0–17.0)
MCH: 29.9 pg (ref 26.0–34.0)
MCHC: 32.6 g/dL (ref 30.0–36.0)
MCV: 91.5 fL (ref 80.0–100.0)
Platelets: 155 K/uL (ref 150–400)
RBC: 3.65 MIL/uL — ABNORMAL LOW (ref 4.22–5.81)
RDW: 13.5 % (ref 11.5–15.5)
WBC: 9.2 K/uL (ref 4.0–10.5)
nRBC: 0 % (ref 0.0–0.2)

## 2023-11-14 LAB — HEPARIN LEVEL (UNFRACTIONATED): Heparin Unfractionated: 0.66 [IU]/mL (ref 0.30–0.70)

## 2023-11-14 LAB — GLUCOSE, CAPILLARY: Glucose-Capillary: 127 mg/dL — ABNORMAL HIGH (ref 70–99)

## 2023-11-14 MED ORDER — PANTOPRAZOLE SODIUM 40 MG PO TBEC
40.0000 mg | DELAYED_RELEASE_TABLET | Freq: Every day | ORAL | Status: DC
  Administered 2023-11-14 – 2023-11-18 (×5): 40 mg via ORAL
  Filled 2023-11-14 (×5): qty 1

## 2023-11-14 MED ORDER — ASPIRIN 325 MG PO TABS
650.0000 mg | ORAL_TABLET | Freq: Three times a day (TID) | ORAL | Status: AC
  Administered 2023-11-14 – 2023-11-16 (×7): 650 mg via ORAL
  Filled 2023-11-14 (×7): qty 2

## 2023-11-14 MED ORDER — COLCHICINE 0.6 MG PO TABS
0.6000 mg | ORAL_TABLET | Freq: Two times a day (BID) | ORAL | Status: DC
  Administered 2023-11-14 – 2023-11-18 (×9): 0.6 mg via ORAL
  Filled 2023-11-14 (×9): qty 1

## 2023-11-14 MED ORDER — ENOXAPARIN SODIUM 40 MG/0.4ML IJ SOSY
40.0000 mg | PREFILLED_SYRINGE | INTRAMUSCULAR | Status: DC
  Administered 2023-11-14 – 2023-11-18 (×5): 40 mg via SUBCUTANEOUS
  Filled 2023-11-14 (×5): qty 0.4

## 2023-11-14 MED ORDER — ENOXAPARIN SODIUM 40 MG/0.4ML IJ SOSY: 40.0000 mg | PREFILLED_SYRINGE | INTRAMUSCULAR | Status: DC

## 2023-11-14 NOTE — Progress Notes (Signed)
 PHARMACY - ANTICOAGULATION CONSULT NOTE  Pharmacy Consult for Heparin Indication: atrial fibrillation  Allergies  Allergen Reactions   Atorvastatin Other (See Comments)    SEVERE LEG PAIN     Rosuvastatin Other (See Comments)    JOINT PAIN       Morphine  Other (See Comments)    confusing   Clindamycin Rash and Other (See Comments)    PATIENT REFUSES    Patient Measurements: Height: 5' 6 (167.6 cm) Weight: 84.8 kg (186 lb 15.2 oz) IBW/kg (Calculated) : 63.8 HEPARIN DW (KG): 81.8  Vital Signs: Temp: 97.8 F (36.6 C) (10/31 0722) Temp Source: Oral (10/31 0722) BP: 123/55 (10/31 0722) Pulse Rate: 63 (10/31 0722)  Labs: Recent Labs    11/13/23 0634 11/13/23 0852 11/13/23 1810 11/14/23 0234  HGB 11.5*  --   --  10.9*  HCT 35.2*  --   --  33.4*  PLT 153  --   --  155  HEPARINUNFRC  --   --  0.41 0.66  CREATININE 1.27*  --   --  1.30*  TROPONINIHS 38* 33*  --   --     Estimated Creatinine Clearance (by C-G formula based on SCr of 1.3 mg/dL (H)) Male: 67.1 mL/min (A) Male: 40.1 mL/min (A)   Medical History: Past Medical History:  Diagnosis Date   Adenomatous polyp    history   Arthritis    knees , shoulders, hands    Atrial bigeminy    in the past   BPH (benign prostatic hyperplasia)    Carotid artery disease    a. Doppler, January, 2012, 40-59% bilateral stenoses;  b.  Carotids 4/14:  40-59% bilat ICA, occluded R vertebral artery; f/u 1 year // Carotid US  07/2018:  Bilat ICA 1-39; R subclavian stenosis >> rpt in 1 year  // Carotid US  07/2019: Bilateral ICA 40-59; right subclavian stenosis; repeat 1 year // Carotid US  7/22: LICA 40-59, R subcl stenosis.//Carotid US  07/2021: R 1-39, L 40-59   Colon polyps    Coronary artery disease    acute inferior MI Jan 2002 with stent / Taxus stent LAD 2004 / Cypher stent prox LAD and cypher stent mid circ May 2006 / nuclear stress Dec 2009 normal EF 63% / nuclear August 2011 EF 62% / inferior thining and very mild  ischemia inferior and apex / catherization Sept 2011 EF 60%  mild to moderate diffuse RCA disease, moderate discrete LAD stenosis before the stent in the proximal LAD med tx rec   Diverticulosis    Dyslipidemia    Ejection fraction    Fatty liver    history   GERD (gastroesophageal reflux disease)    Hypertension    [facility] - 5 stents total , last cardiac cath. 2011.  Cleared for  surgery by Dr. Micky- 04/23/2013   IBS (irritable bowel syndrome)    Myocardial infarction (HCC) 2002   Myoview     Myoview  07/2018: EF 63, inferior artifact, no ischemia or scar; Low Risk   Numbness and tingling in hands    resolved   Overweight(278.02)    Prostate cancer (HCC) approx 2016   treated /w  Lupron injection x1, then radium   Shortness of breath dyspnea    Type II diabetes mellitus (HCC)     Assessment: 89 y.o. M presents with afib, started on IV heparin, no AC PTA.  Heparin level therapeutic at 0.66, H/H stable, pltc wnl.  Goal of Therapy:  Heparin level 0.3-0.7 units/ml Monitor platelets  by anticoagulation protocol: Yes   Plan:  Continue Heparin gtt at 1200 units/hr Daily heparin level and CBC   Ozell Jamaica, PharmD, [place], [facility] Clinical Pharmacist [phone] Please check AMION for all [facility] numbers 11/14/2023

## 2023-11-14 NOTE — Progress Notes (Signed)
 Progress Note   Patient: [name] Ray FMW:[id number] DOB: [birth year] DOA: 11/13/2023     0 DOS: the patient was seen and examined on 11/14/2023   Brief hospital course: The patient is a 89 yr old man who presented to [facility] on 11/13/2023 with complaints of chest pain and shortness of breath. He has a past medical history significant for HTN, dyslipidemia, CAD s/p stents, DM II, GERD.  The patient had a back injury from a fall in Aug 2025 resulting in a vertebral fracture. He takes oxycodone  for the back pain. He has been sleeping in a recliner since his fall until the last couple of weeks.   In the ED the patient was found to have a pulse of 45-58. His troponin was 38 and then 33. D dimer was 0.97. Potassium was 5.5, BUN was 29 and creatinine was 1.27.   CTA chest was negative for PE, but did demonstrate aortic atherosclerosis with atherosclerotic CAD with stents present   The patient was admitted to a telemetry bed. Cardiology was consulted.   Assessment and Plan: Chest pain Elevated troponin Patient presented with complaints of chest pain reports of radiation into his stomach.  High-sensitivity troponin 38-> 33.    Chest x-ray did not noted acute abnormality. D-dimer was noted to be elevated at 0.97.  CT angiogram of the chest did not reveal any acute signs for pulmonary embolism but did note aortic atherosclerosis with atherosclerotic coronary artery disease with coronary stent is present.  Patient had taken nitroglycerin  prior to arrival. -Echocardiogram was obtained and demonstrated and EF of 55-60% with normal left ventricular function. Regional wall motion was not able to be evaluated. There was severe left ventricular diastolic function could not be evaluated. RV ventricular systolic function is normal. PASP was moderately elevated at 45.7.   The patient was evaluated by cardiology. They do not believe that the patient's symptoms are related to CAD. They feel that his chest pain is  most likely related to pericarditis. CRP is elevated. They have started him on tid high dose ASA and colchicine 0.6 mg bid. They will watch the patient to see if this improves his pain. If it does he will be discharged on these anti-inflammatories and diagnose pericarditis as the source of his pain. If his symptoms do not improve on antiinflammatories, he may require a LHC.   New onset atrial fibrillation Patient found to be in atrial fibrillation with slow ventricular rate.  Patient not on any rate controlling medications.  He was started on heparin drip per pharmacy. CHA2DS2-VASc score equal to at least 5 (based off age, HTN, CAD, DM) - Goal potassium at least 4 and magnesium at least 2 - Continue heparin drip per pharmacy   CAD Patient with a history of multiple stents. - Continue Plavix  and statin   Diastolic congestive heart failure Patient appears to be fairly euvolemic on physical exam at this time.  CTA of the chest tiny amount of bilateral basilar pleural effusion with subtle dependent bibasilar atelectatic changes.  Last echocardiogram noted EF to be 55 to 60% with grade 1 diastolic dysfunction back in 06/2020. - Strict I&O's and daily weights - Check BP - Lasix 40 mg IV given x 1 dose. Will start PO lasix.    Hyperkalemia Resolved. Acute.  Initial potassium noted to be elevated at 5.5.   - Continue to monitor   Controlled diabetes mellitus type 2, without long-term use of insulin  On admission glucose noted to be 122.  Last  available hemoglobin A1c noted to be 5.6 when checked on 09/02/2023. - Hypoglycemic protocols - Hold linagliptin -metformin    Chronic kidney disease stage IIIa Creatinine noted to be 1.30 with BUN 29.  Kidney function appears to be near patient's baseline. - Continue to monitor   Normocytic anemia Hemoglobin 11.5 which appears improved from prior. - Continue to monitor   Hyperlipidemia - Check lipid panel - Continue simvastatin    DVT prophylaxis:  Heparin Advance Care Planning:   Code Status: Full Code     Consults: Cardiology   Family Communication: Wife updated at bedside         Subjective: The patient is resting comfortably. He states that he continues to have some pain, but it is better. He has some difficulty characterizing it. It is notably worse with deep respiration.  Physical Exam: Vitals:   11/14/23 0500 11/14/23 0722 11/14/23 1150 11/14/23 1700  BP:  (!) 123/55 (!) 116/53 125/60  Pulse:  63 73 71  Resp:  16 20 18   Temp:  97.8 F (36.6 C) 98 F (36.7 C)   TempSrc:  Oral Oral   SpO2:  98% 100% 96%  Weight: 84.8 kg     Height:       Exam:  Constitutional:  The patient is awake, alert, and oriented x 3. No acute distress. Respiratory:  No increased work of breathing. No wheezes, rales, or rhonchi No tactile fremitus Cardiovascular:  Regular rate and rhythm No murmurs, ectopy, or gallups. No lateral PMI. No thrills. Abdomen:  Abdomen is soft, non-tender, non-distended No hernias, masses, or organomegaly Normoactive bowel sounds.  Musculoskeletal:  No cyanosis, clubbing, or edema Skin:  No rashes, lesions, ulcers palpation of skin: no induration or nodules Neurologic:  CN 2-12 intact Sensation all 4 extremities intact Psychiatric:  Mental status Mood, affect appropriate Orientation to person, place, time  judgment and insight appear intact  Data Reviewed:  Cbc, bmp, echocardiogram  Family Communication: Spouse at bedside  Disposition: Status is: Observation The patient remains OBS appropriate and will d/c before 2 midnights.  Planned Discharge Destination: Home    Time spent: 36 minutes  Author: Linden Tagliaferro, DO 11/14/2023 6:33 PM  For on call review [email or web address].

## 2023-11-14 NOTE — Telephone Encounter (Signed)
 Pharmacy Patient Advocate Encounter  Insurance verification completed.    The patient is insured through [employer]. Patient has Medicare and is not eligible for a copay card, but may be able to apply for patient assistance or Medicare RX Payment Plan (Patient Must reach out to their plan, if eligible for payment plan), if available.    Ran test claim for Eliquis 5mg  and the current 30 day co-pay is $40.00.  Ran test claim for Xarelto 20mg  and the current 30 day co-pay is $40.00.   This test claim was processed through [facility]- copay amounts may vary at other pharmacies due to pharmacy/plan contracts, or as the patient moves through the different stages of their insurance plan.

## 2023-11-14 NOTE — Progress Notes (Signed)
 Cardiology Progress Note  Patient ID: Jeromie Gainor MRN: [id number] DOB: 1933-09-26 Date of Encounter: 11/14/2023 Primary Cardiologist: Ozell Fell, MD  Subjective   Chief Complaint: Chest pain  HPI: Continues to describe sharp chest discomfort.  Occurs worse with laying flat.  Better with leaning forward.  CRP elevated.  ROS:  All other ROS reviewed and negative. Pertinent positives noted in the HPI.     Telemetry  Overnight telemetry shows Afib 90s, which I personally reviewed.   ECG  The most recent ECG shows Afib 57 bpm, which I personally reviewed.   Physical Exam   Vitals:   11/14/23 0019 11/14/23 0357 11/14/23 0500 11/14/23 0722  BP: (!) 156/61 (!) 148/61  (!) 123/55  Pulse: 72 74  63  Resp: 18 18  16   Temp: 98.2 F (36.8 C) 98 F (36.7 C)  97.8 F (36.6 C)  TempSrc: Oral Oral  Oral  SpO2: 100% 95%  98%  Weight:   84.8 kg   Height:        Intake/Output Summary (Last 24 hours) at 11/14/2023 1106 Last data filed at 11/14/2023 1029 Gross per 24 hour  Intake 351.94 ml  Output 1475 ml  Net -1123.06 ml       11/14/2023    5:00 AM 11/13/2023    2:45 PM 11/13/2023    6:30 AM  Last 3 Weights  Weight (lbs) 186 lb 15.2 oz 190 lb 11.2 oz 187 lb 6.3 oz  Weight (kg) 84.8 kg 86.5 kg 85 kg    Body mass index is 30.17 kg/m.  General: Well nourished, well developed, in no acute distress Head: Atraumatic, normal size  Eyes: PEERLA, EOMI  Neck: Supple, no JVD Endocrine: No thryomegaly Cardiac: Normal S1, S2; irregular rhythm, no murmurs Lungs: Clear to auscultation bilaterally, no wheezing, rhonchi or rales  Abd: Soft, nontender, no hepatomegaly  Ext: No edema, pulses 2+ Musculoskeletal: No deformities, BUE and BLE strength normal and equal Skin: Warm and dry, no rashes   Neuro: Alert and oriented to person, place, time, and situation, CNII-XII grossly intact, no focal deficits  Psych: Normal mood and affect   Cardiac Studies  TTE 11/13/2023  1. Left  ventricular ejection fraction, by estimation, is 55 to 60%. The  left ventricle has normal function. Left ventricular endocardial border  not optimally defined to evaluate regional wall motion. There is severe  left ventricular hypertrophy of the  basal-septal segment. Left ventricular diastolic function could not be  evaluated.   2. Right ventricular systolic function is normal. The right ventricular  size is normal. There is moderately elevated pulmonary artery systolic  pressure. The estimated right ventricular systolic pressure is 45.7 mmHg.   3. The mitral valve is normal in structure. Trivial mitral valve  regurgitation. No evidence of mitral stenosis.   4. The aortic valve is tricuspid. Aortic valve regurgitation is not  visualized. Aortic valve sclerosis/calcification is present, without any  evidence of aortic stenosis. Aortic valve area, by VTI measures 1.64 cm.  Aortic valve mean gradient measures  4.0 mmHg. Aortic valve Vmax measures 1.45 m/s.   5. The inferior vena cava is normal in size with <50% respiratory  variability, suggesting right atrial pressure of 8 mmHg.   Patient Profile  [name] Ray is a 89 y.o. adult with CKD 3a, CAD status post PCI, type 2 diabetes, hypertension, hyperlipidemia, recent fall with lumbar vertebral fracture admitted on 11/13/2023 with chest pain and new onset A-fib.  Assessment & Plan   #  Chest pain, pleuritic # Shoulder pain # Recent fall with L5 vertebral fracture - Admitted to the hospital with acute chest pain and new onset A-fib.  He reports it is worse with breathing and deep.  Described as sharp.  He also endorses back pain and shoulder pain at times.  He tells me sitting up helps it. - CRP is elevated. - EKG with no definitive ST elevation.  However given that it is better with sitting forward and elevated CRP his symptoms could represent pericarditis. - I would like to stop his heparin today.  We will try high-dose aspirin  650  mg 3 times daily and colchicine 0.6 mg twice daily.  If this improves his pain I suspect this is just pericarditis.  At Protonix  40 mg daily.  We should know pretty quickly if his symptoms will improve with anti-inflammatories.  If they do not we will need to consider other etiologies including possible need for cardiac cath.  His symptoms just do not sound like obstructive epicardial coronary disease.  # Acute diastolic heart failure - Given Lasix 40 mg IV as a one-time dose.  Net -1.2 L.  Euvolemic.  Can pursue Lasix p.o. as needed.  # New onset A-fib - Rate controlled on no medications.  Hold beta-blocker for now.  Has a history of first AV block. - I am stopping heparin as we are going to treat him for presumptive pericarditis. - Blood counts are stable.  Recent admission with L3 vertebral fracture, iliopsoas hematoma and retroperitoneal hematoma.  If he has pericarditis responds well to this we may want to hold anticoagulation until he has completed therapy.  If his symptoms do not improve with treatment of pericarditis he will need to go back on them. - There is clearly a risk of bleeding but I think he is far enough out from his fall and injury that it would be okay when need be started.  # CKD 3a - Stable   # Elevated troponin, demand # CAD status post remote PCI - Troponins are minimally elevated and flat.  Likely related to diastolic heart failure.  His chest pain symptoms likely represent pericarditis or musculoskeletal pain.  See discussion as above. - If symptoms persist may need to consider cardiac cath.  However symptoms seem to be quite inconsistent with CAD. - He was on Plavix  at home.  This has been stopped.  He will just need to be on anticoagulation as monotherapy moving forward.  # Recent fall August 2025 # L3 vertebral fracture # Iliopsoas hematoma # Retroperitoneal hematoma -Admitted in August with fall and L3 vertebral fracture managed medically.  Suffered iliopsoas  hematoma and retroperitoneal hematoma.  Plavix  was held.  He was placed on heparin yesterday.  Hemoglobin did trend down slightly.  No evidence of bleeding.  We are holding anticoagulation while we trial him on treatment for pericarditis.      For questions or updates, please contact [facility] Please consult [email or web address] for contact info under        Signed, Darryle T. Barbaraann, MD, [facility]  [facility]  11/14/2023 11:06 AM

## 2023-11-14 NOTE — TOC CM/SW Note (Addendum)
 Transition of Care [facility]) - Inpatient Brief Assessment   Patient Details  Name: [name] Ray MRN: [id number] Date of Birth: May 29, 1933  Transition of [facility]) CM/SW Contact:    Waddell Barnie Rama, RN Phone Number: 11/14/2023, 4:29 PM   Clinical Narrative: From home with spouse, has PCP and insurance on file, states has  [facility] services in place with [facility] for HHPT  at this time, has walker/cane at home.  States family member  (wife) will transport them home at [employer] and family is support system, states gets medications from [facility].  Pta self ambulatory with cane/walker.   There are no ICM needs identified  at this time.  Please place consult for ICM needs.  NCM called  [facility] at [phone], confirmed he has home physical therapy with them.  He is observation so will not need an order to resume, but if becomes inpatient, will need order.   Transition of Care Asessment: Insurance and Status: Insurance coverage has been reviewed Patient has primary care physician: Yes Home environment has been reviewed: home with wife Prior level of function:: ambulatory with walker/cane Prior/Current Home Services: Current home services (walker/cane) Social Drivers of Health Review: SDOH reviewed no interventions necessary Readmission risk has been reviewed: Yes Transition of care needs: no transition of care needs at this time

## 2023-11-15 DIAGNOSIS — I309 Acute pericarditis, unspecified: Secondary | ICD-10-CM | POA: Diagnosis not present

## 2023-11-15 DIAGNOSIS — I4891 Unspecified atrial fibrillation: Secondary | ICD-10-CM | POA: Diagnosis not present

## 2023-11-15 DIAGNOSIS — R079 Chest pain, unspecified: Secondary | ICD-10-CM | POA: Diagnosis not present

## 2023-11-15 LAB — CBC
HCT: 28.2 % — ABNORMAL LOW (ref 39.0–52.0)
Hemoglobin: 9.4 g/dL — ABNORMAL LOW (ref 13.0–17.0)
MCH: 30.8 pg (ref 26.0–34.0)
MCHC: 33.3 g/dL (ref 30.0–36.0)
MCV: 92.5 fL (ref 80.0–100.0)
Platelets: 128 K/uL — ABNORMAL LOW (ref 150–400)
RBC: 3.05 MIL/uL — ABNORMAL LOW (ref 4.22–5.81)
RDW: 13.6 % (ref 11.5–15.5)
WBC: 6.7 K/uL (ref 4.0–10.5)
nRBC: 0 % (ref 0.0–0.2)

## 2023-11-15 LAB — BASIC METABOLIC PANEL WITH GFR
Anion gap: 9 (ref 5–15)
BUN: 34 mg/dL — ABNORMAL HIGH (ref 8–23)
CO2: 26 mmol/L (ref 22–32)
Calcium: 8.1 mg/dL — ABNORMAL LOW (ref 8.9–10.3)
Chloride: 96 mmol/L — ABNORMAL LOW (ref 98–111)
Creatinine, Ser: 1.5 mg/dL — ABNORMAL HIGH (ref 0.61–1.24)
GFR, Estimated: 45 mL/min — ABNORMAL LOW (ref >60)
Glucose, Bld: 161 mg/dL — ABNORMAL HIGH (ref 70–99)
Potassium: 4.4 mmol/L (ref 3.5–5.1)
Sodium: 131 mmol/L — ABNORMAL LOW (ref 135–145)

## 2023-11-15 MED ORDER — OXYCODONE-ACETAMINOPHEN 5-325 MG PO TABS
1.0000 | ORAL_TABLET | ORAL | Status: DC | PRN
  Administered 2023-11-15 – 2023-11-16 (×5): 1 via ORAL
  Filled 2023-11-15 (×5): qty 1

## 2023-11-15 NOTE — Progress Notes (Signed)
  Progress Note  Patient Name: Bradyn Soward Date of Encounter: 11/15/2023 [facility] Cardiologist: Ozell Fell, MD   Interval Summary   Continuing to have chest pain.  Feels well while at rest, but does have pain with deep breathing and lying flat.  Vital Signs Vitals:   11/15/23 0246 11/15/23 0538 11/15/23 0540 11/15/23 0755  BP:   138/60 134/61  Pulse:   (!) 54 82  Resp:   18 18  Temp:  98.2 F (36.8 C)  98 F (36.7 C)  TempSrc:  Oral  Oral  SpO2:   99% 100%  Weight: 84 kg     Height:        Intake/Output Summary (Last 24 hours) at 11/15/2023 1121 Last data filed at 11/15/2023 1100 Gross per 24 hour  Intake 840 ml  Output 600 ml  Net 240 ml      11/15/2023    2:46 AM 11/14/2023    5:00 AM 11/13/2023    2:45 PM  Last 3 Weights  Weight (lbs) 185 lb 1.6 oz 186 lb 15.2 oz 190 lb 11.2 oz  Weight (kg) 83.961 kg 84.8 kg 86.5 kg      Telemetry/ECG  Atrial fibrillation- Personally Reviewed  Physical Exam  GEN: No acute distress.   Neck: No JVD Cardiac: Irregular, no murmurs, rubs, or gallops.  Respiratory: Clear to auscultation bilaterally. GI: Soft, nontender, non-distended  MS: No edema  Assessment & Plan   1.  Pericarditis: On high-dose aspirin  and colchicine.  His pain has improved.  He does have an elevated CRP which supports the diagnosis of pericarditis.  He is on Protonix  for GI prophylaxis.  For now, we Shaunak Kreis continue with his current medications.  2.  Acute diastolic heart failure: Appears euvolemic.  Lasix as needed.  3.  New onset atrial fibrillation.  On no rate controlling medications at this time.  Nikesh Teschner hold off on anticoagulation as he is continue to have issues with pericarditis.  Richard Ritchey need outpatient follow-up.  4.  Coronary artery disease: Has remote PCI.  Elevated troponin likely related to demand ischemia.   For questions or updates, please contact [facility] Please consult [email or web address] for contact info under          Signed, Vania Rosero Gladis Norton, MD

## 2023-11-15 NOTE — Progress Notes (Signed)
 PROGRESS NOTE    [name] Ray  FMW:[id number] DOB: 12-09-1933 DOA: 11/13/2023 PCP: Vernadine Charlie ORN, MD  Chief Complaint  Patient presents with   Chest Pain    Brief Narrative:    89 yr old man who presented to [facility] on 11/13/2023 with complaints of chest pain and shortness of breath. He has Nash Bolls past medical history significant for HTN, dyslipidemia, CAD s/p stents, DM II, GERD.   The patient had [place] back injury from Zoejane Gaulin fall in Aug 2025 resulting in Khalea Ventura vertebral fracture. He takes oxycodone  for the back pain. He has been sleeping in Aman Batley recliner since his fall until the last couple of weeks.    In the ED the patient was found to have Anina Schnake pulse of 45-58. His troponin was 38 and then 33. D dimer was 0.97. Potassium was 5.5, BUN was 29 and creatinine was 1.27.    CTA chest was negative for PE, but did demonstrate aortic atherosclerosis with atherosclerotic CAD with stents present    The patient was admitted to Stephanine Reas telemetry bed. Cardiology was consulted. He's being treated with concern for pericarditis.  Assessment & Plan:   Principal Problem:   Acute chest pain Active Problems:   Elevated troponin   New onset Nyeisha Goodall-fib (HCC)   Coronary artery disease   Chronic diastolic CHF (congestive heart failure) (HCC)   Hyperkalemia   Controlled type 2 diabetes mellitus without complication, without long-term current use of insulin  (HCC)   Chronic kidney disease, stage 3a (HCC)   Normocytic anemia   Hyperlipidemia   Acute diastolic heart failure (HCC)   Demand ischemia (HCC)  Pericarditis Chest Pain  Left Shoulder Pain Elevated Troponin  Hx CAD Appreciate cardiology assistance, currently being treated for pericarditis with aspirin /colchicine Echo with preserved EF, severe LVH, no pericardial effusion Workup additionally as indicated  Atrial fibrillation Anticoagulation currently on hold with pericarditis above  Diastolic Heart Failure Euvolemic on exam Will continue to monitor    Hyperkalemia Resolved  T2DM Linagliptin /metformin  on hold A1c 5.6 09/02/2023  CKD stage IIIa  Will trend  Anemia Trend  Dyslipidemia Statin LDL 37    DVT prophylaxis: lovenox  Code Status: full Family Communication: none Disposition:   Status is: Observation The patient will require care spanning > 2 midnights and should be moved to inpatient because: need for continued cardiology care   Consultants:  cardiology  Procedures:  Echo IMPRESSIONS     1. Left ventricular ejection fraction, by estimation, is 55 to 60%. The  left ventricle has normal function. Left ventricular endocardial border  not optimally defined to evaluate regional wall motion. There is severe  left ventricular hypertrophy of the  basal-septal segment. Left ventricular diastolic function could not be  evaluated.   2. Right ventricular systolic function is normal. The right ventricular  size is normal. There is moderately elevated pulmonary artery systolic  pressure. The estimated right ventricular systolic pressure is 45.7 mmHg.   3. The mitral valve is normal in structure. Trivial mitral valve  regurgitation. No evidence of mitral stenosis.   4. The aortic valve is tricuspid. Aortic valve regurgitation is not  visualized. Aortic valve sclerosis/calcification is present, without any  evidence of aortic stenosis. Aortic valve area, by VTI measures 1.64 cm.  Aortic valve mean gradient measures  4.0 mmHg. Aortic valve Vmax measures 1.45 m/s.   5. The inferior vena cava is normal in size with <50% respiratory  variability, suggesting right atrial pressure of 8 mmHg.   Antimicrobials:  Anti-infectives (  From admission, onward)    None       Subjective: Persistent L shoulder pain  Objective: Vitals:   11/15/23 0538 11/15/23 0540 11/15/23 0755 11/15/23 1430  BP:  138/60 134/61 125/62  Pulse:  (!) 54 82 (!) 56  Resp:  18 18 18   Temp: 98.2 F (36.8 C)  98 F (36.7 C) 98.2 F (36.8 C)   TempSrc: Oral  Oral Oral  SpO2:  99% 100% 98%  Weight:      Height:        Intake/Output Summary (Last 24 hours) at 11/15/2023 1639 Last data filed at 11/15/2023 1433 Gross per 24 hour  Intake 1200 ml  Output 600 ml  Net 600 ml   Filed Weights   11/13/23 1445 11/14/23 0500 11/15/23 0246  Weight: 86.5 kg 84.8 kg 84 kg    Examination:  General exam: Appears calm and comfortable  Respiratory system: Clear to auscultation. Respiratory effort normal. Cardiovascular system: RRR Gastrointestinal system: Abdomen is nondistended, soft and nontender.  Central nervous system: Alert and oriented. No focal neurological deficits. Extremities: no LEE - R heel abrasion    Data Reviewed: I have personally reviewed following labs and imaging studies  CBC: Recent Labs  Lab 11/13/23 0634 11/14/23 0234 11/15/23 0234  WBC 8.2 9.2 6.7  HGB 11.5* 10.9* 9.4*  HCT 35.2* 33.4* 28.2*  MCV 93.4 91.5 92.5  PLT 153 155 128*    Basic Metabolic Panel: Recent Labs  Lab 11/13/23 0634 11/13/23 1554 11/14/23 0234 11/15/23 0846  NA 136  --  133* 131*  K 5.5*  --  4.6 4.4  CL 98  --  96* 96*  CO2 26  --  22 26  GLUCOSE 122*  --  144* 161*  BUN 29*  --  26* 34*  CREATININE 1.27*  --  1.30* 1.50*  CALCIUM 9.2  --  8.9 8.1*  MG  --  1.7  --   --     GFR: Estimated Creatinine Clearance (by C-G formula based on SCr of 1.5 mg/dL (H)) Male: 71.6 mL/min (Migel Hannis) Male: 34.6 mL/min (Tashona Calk)  Liver Function Tests: No results for input(s): AST, ALT, ALKPHOS, BILITOT, PROT, ALBUMIN in the last 168 hours.  CBG: Recent Labs  Lab 11/13/23 1536 11/13/23 2035 11/14/23 0640  GLUCAP 89 147* 127*     No results found for this or any previous visit (from the past 240 hours).       Radiology Studies: No results found.      Scheduled Meds:  aspirin   650 mg Oral TID   colchicine  0.6 mg Oral BID   enoxaparin  (LOVENOX ) injection  40 mg Subcutaneous Q24H   lidocaine   1 patch  Transdermal Q24H   pantoprazole   40 mg Oral Daily   simvastatin   20 mg Oral QHS   sodium chloride  flush  3 mL Intravenous Q12H   tamsulosin   0.4 mg Oral QPC supper   Continuous Infusions:   LOS: 0 days    Time spent: over 30 min    Meliton Monte, MD [facility]   To contact the attending provider between 7A-7P or the covering provider during after hours 7P-7A, please log into the web site [email or web address] and access using universal [place] password for that web site. If you do not have the password, please call the hospital operator.  11/15/2023, 4:39 PM

## 2023-11-16 DIAGNOSIS — R079 Chest pain, unspecified: Secondary | ICD-10-CM | POA: Diagnosis not present

## 2023-11-16 DIAGNOSIS — I309 Acute pericarditis, unspecified: Secondary | ICD-10-CM | POA: Diagnosis not present

## 2023-11-16 DIAGNOSIS — I4891 Unspecified atrial fibrillation: Secondary | ICD-10-CM | POA: Diagnosis not present

## 2023-11-16 LAB — COMPREHENSIVE METABOLIC PANEL WITH GFR
ALT: 16 U/L (ref 0–44)
AST: 18 U/L (ref 15–41)
Albumin: 3 g/dL — ABNORMAL LOW (ref 3.5–5.0)
Alkaline Phosphatase: 56 U/L (ref 38–126)
Anion gap: 14 (ref 5–15)
BUN: 40 mg/dL — ABNORMAL HIGH (ref 8–23)
CO2: 21 mmol/L — ABNORMAL LOW (ref 22–32)
Calcium: 8 mg/dL — ABNORMAL LOW (ref 8.9–10.3)
Chloride: 96 mmol/L — ABNORMAL LOW (ref 98–111)
Creatinine, Ser: 1.48 mg/dL — ABNORMAL HIGH (ref 0.61–1.24)
GFR, Estimated: 45 mL/min — ABNORMAL LOW (ref >60)
Glucose, Bld: 127 mg/dL — ABNORMAL HIGH (ref 70–99)
Potassium: 4.1 mmol/L (ref 3.5–5.1)
Sodium: 131 mmol/L — ABNORMAL LOW (ref 135–145)
Total Bilirubin: 0.4 mg/dL (ref 0.0–1.2)
Total Protein: 6 g/dL — ABNORMAL LOW (ref 6.5–8.1)

## 2023-11-16 LAB — CBC WITH DIFFERENTIAL/PLATELET
Abs Immature Granulocytes: 0.02 K/uL (ref 0.00–0.07)
Basophils Absolute: 0 K/uL (ref 0.0–0.1)
Basophils Relative: 0 %
Eosinophils Absolute: 0.2 K/uL (ref 0.0–0.5)
Eosinophils Relative: 3 %
HCT: 30.8 % — ABNORMAL LOW (ref 39.0–52.0)
Hemoglobin: 10.2 g/dL — ABNORMAL LOW (ref 13.0–17.0)
Immature Granulocytes: 0 %
Lymphocytes Relative: 11 %
Lymphs Abs: 0.7 K/uL (ref 0.7–4.0)
MCH: 30.4 pg (ref 26.0–34.0)
MCHC: 33.1 g/dL (ref 30.0–36.0)
MCV: 91.7 fL (ref 80.0–100.0)
Monocytes Absolute: 0.7 K/uL (ref 0.1–1.0)
Monocytes Relative: 11 %
Neutro Abs: 4.5 K/uL (ref 1.7–7.7)
Neutrophils Relative %: 75 %
Platelets: 157 K/uL (ref 150–400)
RBC: 3.36 MIL/uL — ABNORMAL LOW (ref 4.22–5.81)
RDW: 13.5 % (ref 11.5–15.5)
WBC: 6.1 K/uL (ref 4.0–10.5)
nRBC: 0 % (ref 0.0–0.2)

## 2023-11-16 LAB — MAGNESIUM: Magnesium: 1.6 mg/dL — ABNORMAL LOW (ref 1.7–2.4)

## 2023-11-16 LAB — PHOSPHORUS: Phosphorus: 3.6 mg/dL (ref 2.5–4.6)

## 2023-11-16 MED ORDER — ASPIRIN 325 MG PO TABS
650.0000 mg | ORAL_TABLET | Freq: Three times a day (TID) | ORAL | Status: DC
  Administered 2023-11-16 – 2023-11-18 (×5): 650 mg via ORAL
  Filled 2023-11-16 (×6): qty 2

## 2023-11-16 MED ORDER — CALCIUM CARBONATE ANTACID 500 MG PO CHEW
1.0000 | CHEWABLE_TABLET | Freq: Two times a day (BID) | ORAL | Status: DC | PRN
  Administered 2023-11-16 – 2023-11-17 (×2): 200 mg via ORAL
  Filled 2023-11-16 (×2): qty 1

## 2023-11-16 MED ORDER — MAGNESIUM SULFATE 2 GM/50ML IV SOLN
2.0000 g | Freq: Once | INTRAVENOUS | Status: AC
  Administered 2023-11-16: 2 g via INTRAVENOUS
  Filled 2023-11-16: qty 50

## 2023-11-16 NOTE — Progress Notes (Signed)
  Progress Note  Patient Name: [name] Ray Date of Encounter: 11/16/2023 [facility] Cardiologist: Ozell Fell, MD   Interval Summary   Feeling well currently.  Lying in bed comfortably.  Does continue to have pain when he lies flat or takes deep breaths.  Vital Signs Vitals:   11/16/23 0104 11/16/23 0335 11/16/23 0444 11/16/23 0715  BP: 128/68  (!) 133/53 128/60  Pulse: 90  73 60  Resp: 18  18 18   Temp: 98.3 F (36.8 C)  98.6 F (37 C) 98.1 F (36.7 C)  TempSrc: Oral  Oral Oral  SpO2: 98%  98% 97%  Weight:  83.7 kg    Height:        Intake/Output Summary (Last 24 hours) at 11/16/2023 0836 Last data filed at 11/15/2023 1433 Gross per 24 hour  Intake 600 ml  Output --  Net 600 ml      11/16/2023    3:35 AM 11/15/2023    2:46 AM 11/14/2023    5:00 AM  Last 3 Weights  Weight (lbs) 184 lb 9.6 oz 185 lb 1.6 oz 186 lb 15.2 oz  Weight (kg) 83.734 kg 83.961 kg 84.8 kg      Telemetry/ECG  Atrial fibrillation- Personally Reviewed  Physical Exam  GEN: No acute distress.   Neck: No JVD Cardiac: irregular, no murmurs, rubs, or gallops.  Respiratory: Clear to auscultation bilaterally. GI: Soft, nontender, non-distended  MS: No edema  Assessment & Plan   1.  Pericarditis: On high-dose aspirin  and colchicine.  Pain is improving.  Elevated CRP supports the diagnosis of pericarditis.  On Protonix  for GI prophylaxis.  2.  Diastolic heart failure: Appears euvolemic.  Lasix as needed.  3.  New onset atrial fibrillation: No rate controlling medications at this time.  Holding anticoagulation until pericarditis symptoms improved.  4.  Coronary artery disease: Has remote PCI.  Elevated troponin likely related to demand ischemia.    For questions or updates, please contact [facility] Please consult [email or web address] for contact info under         Signed, Marcellis [place]ampton Gladis Norton, MD

## 2023-11-16 NOTE — Progress Notes (Signed)
 PROGRESS NOTE    [name] Ray  FMW:[id number] DOB: [birth year] DOA: 11/13/2023 PCP: Vernadine Charlie ORN, MD  Chief Complaint  Patient presents with   Chest Pain    Brief Narrative:    89 yr old man who presented to [facility] on 11/13/2023 with complaints of chest pain and shortness of breath. He has [name] Ray past medical history significant for HTN, dyslipidemia, CAD s/p stents, DM II, GERD.   The patient had [name] Ray back injury from [name] Ray fall in Aug 2025 resulting in [name] Ray vertebral fracture. He takes oxycodone  for the back pain. He has been sleeping in [name] Ray recliner since his fall until the last couple of weeks.    In the ED the patient was found to have [name] Ray pulse of 45-58. His troponin was 38 and then 33. D dimer was 0.97. Potassium was 5.5, BUN was 29 and creatinine was 1.27.    CTA chest was negative for PE, but did demonstrate aortic atherosclerosis with atherosclerotic CAD with stents present    The patient was admitted to [name] Ray telemetry bed. Cardiology was consulted. He's being treated with concern for pericarditis.  Assessment & Plan:   Principal Problem:   Acute chest pain Active Problems:   Elevated troponin   New onset [name] Arabie-fib (HCC)   Coronary artery disease   Chronic diastolic CHF (congestive heart failure) (HCC)   Hyperkalemia   Controlled type 2 diabetes mellitus without complication, without long-term current use of insulin  (HCC)   Chronic kidney disease, stage 3a (HCC)   Normocytic anemia   Hyperlipidemia   Acute diastolic heart failure (HCC)   Demand ischemia (HCC)  Pericarditis Chest Pain  Left Shoulder Pain Elevated Troponin  Hx CAD Appreciate cardiology assistance, currently being treated for pericarditis with aspirin /colchicine - gradually improving Echo with preserved EF, severe LVH, no pericardial effusion Workup additionally as indicated  Atrial fibrillation Anticoagulation currently on hold with pericarditis above  Diastolic Heart Failure Euvolemic on exam Will continue  to monitor   Hyperkalemia Resolved  T2DM Linagliptin /metformin  on hold A1c 5.6 09/02/2023  CKD stage IIIa  Will trend  Anemia Trend  Dyslipidemia Statin LDL 37    DVT prophylaxis: lovenox  Code Status: full Family Communication: none Disposition:   Status is: Observation The patient will require care spanning > 2 midnights and should be moved to inpatient because: need for continued cardiology care   Consultants:  cardiology  Procedures:  Echo IMPRESSIONS     1. Left ventricular ejection fraction, by estimation, is 55 to 60%. The  left ventricle has normal function. Left ventricular endocardial border  not optimally defined to evaluate regional wall motion. There is severe  left ventricular hypertrophy of the  basal-septal segment. Left ventricular diastolic function could not be  evaluated.   2. Right ventricular systolic function is normal. The right ventricular  size is normal. There is moderately elevated pulmonary artery systolic  pressure. The estimated right ventricular systolic pressure is 45.7 mmHg.   3. The mitral valve is normal in structure. Trivial mitral valve  regurgitation. No evidence of mitral stenosis.   4. The aortic valve is tricuspid. Aortic valve regurgitation is not  visualized. Aortic valve sclerosis/calcification is present, without any  evidence of aortic stenosis. Aortic valve area, by VTI measures 1.64 cm.  Aortic valve mean gradient measures  4.0 mmHg. Aortic valve Vmax measures 1.45 m/s.   5. The inferior vena cava is normal in size with <50% respiratory  variability, suggesting right atrial pressure of 8 mmHg.  Antimicrobials:  Anti-infectives (From admission, onward)    None       Subjective: Shoulder pain is gradually improving  Objective: Vitals:   11/16/23 0335 11/16/23 0444 11/16/23 0715 11/16/23 1115  BP:  (!) 133/53 128/60 (!) 127/44  Pulse:  73 60 65  Resp:  18 18 20   Temp:  98.6 F (37 C) 98.1 F (36.7  C) 98 F (36.7 C)  TempSrc:  Oral Oral Oral  SpO2:  98% 97% 98%  Weight: 83.7 kg     Height:        Intake/Output Summary (Last 24 hours) at 11/16/2023 1243 Last data filed at 11/16/2023 1200 Gross per 24 hour  Intake 700 ml  Output 150 ml  Net 550 ml   Filed Weights   11/14/23 0500 11/15/23 0246 11/16/23 0335  Weight: 84.8 kg 84 kg 83.7 kg    Examination:  General: No acute distress. Seen standing at sink, cleaning himself up.  Lungs: unlabored Neurological: Alert and oriented 3. Moves all extremities 4 with equal strength. Cranial nerves II through XII grossly intact. Extremities: No clubbing or cyanosis. No edema.   Data Reviewed: I have personally reviewed following labs and imaging studies  CBC: Recent Labs  Lab 11/13/23 0634 11/14/23 0234 11/15/23 0234 11/16/23 0752  WBC 8.2 9.2 6.7 6.1  NEUTROABS  --   --   --  4.5  HGB 11.5* 10.9* 9.4* 10.2*  HCT 35.2* 33.4* 28.2* 30.8*  MCV 93.4 91.5 92.5 91.7  PLT 153 155 128* 157    Basic Metabolic Panel: Recent Labs  Lab 11/13/23 0634 11/13/23 1554 11/14/23 0234 11/15/23 0846 11/16/23 0752  NA 136  --  133* 131* 131*  K 5.5*  --  4.6 4.4 4.1  CL 98  --  96* 96* 96*  CO2 26  --  22 26 21*  GLUCOSE 122*  --  144* 161* 127*  BUN 29*  --  26* 34* 40*  CREATININE 1.27*  --  1.30* 1.50* 1.48*  CALCIUM 9.2  --  8.9 8.1* 8.0*  MG  --  1.7  --   --  1.6*  PHOS  --   --   --   --  3.6    GFR: Estimated Creatinine Clearance (by C-G formula based on SCr of 1.48 mg/dL (H)) Male: 71.2 mL/min ([name] Ray) Male: 35 mL/min ([name] Ray)  Liver Function Tests: Recent Labs  Lab 11/16/23 0752  AST 18  ALT 16  ALKPHOS 56  BILITOT 0.4  PROT 6.0*  ALBUMIN 3.0*    CBG: Recent Labs  Lab 11/13/23 1536 11/13/23 2035 11/14/23 0640  GLUCAP 89 147* 127*     No results found for this or any previous visit (from the past 240 hours).       Radiology Studies: No results found.      Scheduled Meds:  aspirin   650 mg  Oral Q8H   colchicine  0.6 mg Oral BID   enoxaparin  (LOVENOX ) injection  40 mg Subcutaneous Q24H   lidocaine   1 patch Transdermal Q24H   pantoprazole   40 mg Oral Daily   simvastatin   20 mg Oral QHS   sodium chloride  flush  3 mL Intravenous Q12H   tamsulosin   0.4 mg Oral QPC supper   Continuous Infusions:   LOS: 0 days    Time spent: over 30 min    [name] Monte, MD [facility]   To contact the attending provider between 7A-7P or the covering provider during after  hours 7P-7A, please log into the web site [email or web address] and access using universal [place] password for that web site. If you do not have the password, please call the hospital operator.  11/16/2023, 12:43 PM

## 2023-11-17 DIAGNOSIS — I300 Acute nonspecific idiopathic pericarditis: Secondary | ICD-10-CM

## 2023-11-17 DIAGNOSIS — I4891 Unspecified atrial fibrillation: Secondary | ICD-10-CM | POA: Diagnosis not present

## 2023-11-17 DIAGNOSIS — R079 Chest pain, unspecified: Secondary | ICD-10-CM | POA: Diagnosis not present

## 2023-11-17 DIAGNOSIS — I5032 Chronic diastolic (congestive) heart failure: Secondary | ICD-10-CM | POA: Diagnosis not present

## 2023-11-17 LAB — BASIC METABOLIC PANEL WITH GFR
Anion gap: 11 (ref 5–15)
BUN: 45 mg/dL — ABNORMAL HIGH (ref 8–23)
CO2: 22 mmol/L (ref 22–32)
Calcium: 8 mg/dL — ABNORMAL LOW (ref 8.9–10.3)
Chloride: 97 mmol/L — ABNORMAL LOW (ref 98–111)
Creatinine, Ser: 1.58 mg/dL — ABNORMAL HIGH (ref 0.61–1.24)
GFR, Estimated: 42 mL/min — ABNORMAL LOW (ref >60)
Glucose, Bld: 118 mg/dL — ABNORMAL HIGH (ref 70–99)
Potassium: 3.9 mmol/L (ref 3.5–5.1)
Sodium: 130 mmol/L — ABNORMAL LOW (ref 135–145)

## 2023-11-17 LAB — CBC
HCT: 28.4 % — ABNORMAL LOW (ref 39.0–52.0)
Hemoglobin: 9.5 g/dL — ABNORMAL LOW (ref 13.0–17.0)
MCH: 30.4 pg (ref 26.0–34.0)
MCHC: 33.5 g/dL (ref 30.0–36.0)
MCV: 91 fL (ref 80.0–100.0)
Platelets: 153 K/uL (ref 150–400)
RBC: 3.12 MIL/uL — ABNORMAL LOW (ref 4.22–5.81)
RDW: 13.5 % (ref 11.5–15.5)
WBC: 5.7 K/uL (ref 4.0–10.5)
nRBC: 0 % (ref 0.0–0.2)

## 2023-11-17 LAB — PHOSPHORUS: Phosphorus: 3.4 mg/dL (ref 2.5–4.6)

## 2023-11-17 LAB — MAGNESIUM: Magnesium: 1.9 mg/dL (ref 1.7–2.4)

## 2023-11-17 NOTE — Plan of Care (Signed)
   Problem: Education: Goal: Knowledge of General Education information will improve Description: Including pain rating scale, medication(s)/side effects and non-pharmacologic comfort measures Outcome: Progressing   Problem: Activity: Goal: Risk for activity intolerance will decrease Outcome: Progressing   Problem: Nutrition: Goal: Adequate nutrition will be maintained Outcome: Progressing

## 2023-11-17 NOTE — Plan of Care (Signed)

## 2023-11-17 NOTE — Progress Notes (Addendum)
 Progress Note  Patient Name: [name] Ray Date of Encounter: 11/17/2023 [facility] Cardiologist: Ozell Fell, MD   Interval Summary    Feeling better, chest pain has improved significantly with treatment. Has not been out of bed much.   Vital Signs Vitals:   11/16/23 1530 11/16/23 2116 11/17/23 0600 11/17/23 0835  BP: (!) 142/75 (!) 112/49  (!) 129/55  Pulse: 68 64  75  Resp: 19 20  20   Temp: 98.6 F (37 C) 98.4 F (36.9 C)  (!) 97.4 F (36.3 C)  TempSrc: Oral Oral  Oral  SpO2: 98% 98%  98%  Weight:   84.5 kg   Height:        Intake/Output Summary (Last 24 hours) at 11/17/2023 0902 Last data filed at 11/17/2023 0700 Gross per 24 hour  Intake 580 ml  Output 1200 ml  Net -620 ml      11/17/2023    6:00 AM 11/16/2023    3:35 AM 11/15/2023    2:46 AM  Last 3 Weights  Weight (lbs) 186 lb 4.8 oz 184 lb 9.6 oz 185 lb 1.6 oz  Weight (kg) 84.505 kg 83.734 kg 83.961 kg      Telemetry/ECG  Atrial fibrillation rates 50-60s - Personally Reviewed  Physical Exam  GEN: No acute distress.   Neck: No JVD Cardiac: Irreg Irreg, no murmurs, rubs, or gallops.  Respiratory: Clear to auscultation bilaterally. GI: Soft, nontender, non-distended  MS: No edema  Assessment & Plan   89 y.o. adult with CKD 3a, CAD status post PCI, type 2 diabetes, hypertension, hyperlipidemia, recent fall with lumbar vertebral fracture admitted on 11/13/2023 with chest pain and new onset A-fib.   Chest pain Pericarditis -- symptoms seem consistent with pericarditis, CRP 2.5, sed rate 14 -- started on high dose ASA 650mg  TID, and colchicine 0.6mg  BID along with PPI last week. Would recommend high dose ASA for total of 5 days (completed on 11/4), then switch to Eliquis 2.5mg  BID with continuation of colchicine for at least 1 month or longer depending on symptoms -- continue PPI  New onset atrial fibrillation -- rate controlled on no agents -- rec's for anticoagulation at above    Recent fall August 2025 c/b L3 vertebral fracture, Iliopsoas hematoma and Retroperitoneal hematoma -- Admitted in August with fall and L3 vertebral fracture managed medically.  Suffered iliopsoas hematoma and retroperitoneal hematoma.  Plavix  was held.   -- Initially placed on IV heparin on admission but this was held with slight drop in Hgb and treatment for pericarditis with high dose ASA  -- Hgb stable around 9.5-10  Chronic diastolic HF -- appear euvolemic on exam  For questions or updates, please contact [facility] Please consult [email or web address] for contact info under   Signed, Manuelita Rummer, NP   Patient seen, examined. Available data reviewed. Agree with findings, assessment, and plan as outlined by Manuelita Rummer, NP.  Patient known to me from the outpatient setting.  Elderly male, alert and oriented, in no distress.  HEENT is normal, JVP is normal, lungs are clear bilaterally, heart is irregularly irregular with a soft systolic murmur at the apex, abdomen is soft and nontender, extremities have no edema.  He has no significant chest wall tenderness to palpation.  His EKGs, echo study, and clinical history carefully reviewed.  The patient's chest pain is a pleuritic component and could be caused by pericarditis or musculoskeletal pain.  With his elevated inflammatory serologies, it seems appropriate to treat him for  pericarditis as currently being done with aspirin  650 mg 3 times daily and colchicine 0.6 mg twice daily along with GI prophylaxis proton pump inhibitor.  Agree that it is appropriate to transition him from aspirin  to apixaban once he completes 5 days of therapy.  He can be on dose reduced apixaban with his age and creatinine greater than 1.5 (2.5 mg twice daily).  Otherwise, I agree as outlined above.  I will arrange to see the patient back for close cardiology follow-up in about 2 weeks.  Ozell Fell, M.D. 11/17/2023 10:59 AM

## 2023-11-17 NOTE — Progress Notes (Signed)
 PROGRESS NOTE    [name] Ray  FMW:[id number] DOB: 11-14-1933 DOA: 11/13/2023 PCP: Vernadine Charlie ORN, MD  Chief Complaint  Patient presents with   Chest Pain    Brief Narrative:    89 yr old man who presented to [facility] on 11/13/2023 with complaints of chest pain and shortness of breath. He has [name] Ray past medical history significant for HTN, dyslipidemia, CAD s/p stents, DM II, GERD.   The patient had [name] Ray back injury from [name] Ray fall in Aug 2025 resulting in [name] Ray vertebral fracture. He takes oxycodone  for the back pain. He has been sleeping in [name] Ray recliner since his fall until the last couple of weeks.    In the ED the patient was found to have [name] pulse of 45-58. His troponin was 38 and then 33. D dimer was 0.97. Potassium was 5.5, BUN was 29 and creatinine was 1.27.    CTA chest was negative for PE, but did demonstrate aortic atherosclerosis with atherosclerotic CAD with stents present    The patient was admitted to [name] Ray telemetry bed. Cardiology was consulted. He's being treated with concern for pericarditis.  Assessment & Plan:   Principal Problem:   Acute chest pain Active Problems:   Elevated troponin   New onset [name] Ray-fib (HCC)   Coronary artery disease   Chronic diastolic CHF (congestive heart failure) (HCC)   Hyperkalemia   Controlled type 2 diabetes mellitus without complication, without long-term current use of insulin  (HCC)   Chronic kidney disease, stage 3a (HCC)   Normocytic anemia   Hyperlipidemia   Acute diastolic heart failure (HCC)   Demand ischemia (HCC)   Acute idiopathic pericarditis  Pericarditis Chest Pain  Left Shoulder Pain Elevated Troponin  Hx CAD Appreciate cardiology assistance, currently being treated for pericarditis with aspirin /colchicine - gradually improving.  Plan for 5 days aspirin  and colchicine x1 month.   Echo with preserved EF, severe LVH, no pericardial effusion Workup additionally as indicated  Atrial fibrillation Anticoagulation currently on  hold with pericarditis above Planning to start eliquis after he completes 5 days of aspirin   Diastolic Heart Failure Euvolemic on exam Will continue to monitor   Hyperkalemia Resolved  T2DM Linagliptin /metformin  on hold A1c 5.6 09/02/2023  CKD stage IIIa  Will trend  Anemia Trend  Dyslipidemia Statin LDL 37    DVT prophylaxis: lovenox  Code Status: full Family Communication: none Disposition:   Status is: Observation The patient will require care spanning > 2 midnights and should be moved to inpatient because: need for continued cardiology care   Consultants:  cardiology  Procedures:  Echo IMPRESSIONS     1. Left ventricular ejection fraction, by estimation, is 55 to 60%. The  left ventricle has normal function. Left ventricular endocardial border  not optimally defined to evaluate regional wall motion. There is severe  left ventricular hypertrophy of the  basal-septal segment. Left ventricular diastolic function could not be  evaluated.   2. Right ventricular systolic function is normal. The right ventricular  size is normal. There is moderately elevated pulmonary artery systolic  pressure. The estimated right ventricular systolic pressure is 45.7 mmHg.   3. The mitral valve is normal in structure. Trivial mitral valve  regurgitation. No evidence of mitral stenosis.   4. The aortic valve is tricuspid. Aortic valve regurgitation is not  visualized. Aortic valve sclerosis/calcification is present, without any  evidence of aortic stenosis. Aortic valve area, by VTI measures 1.64 cm.  Aortic valve mean gradient measures  4.0 mmHg. Aortic valve Vmax  measures 1.45 m/s.   5. The inferior vena cava is normal in size with <50% respiratory  variability, suggesting right atrial pressure of 8 mmHg.   Antimicrobials:  Anti-infectives (From admission, onward)    None       Subjective: He notes continued improvement in pain Wife is at  bedside  Objective: Vitals:   11/16/23 2116 11/17/23 0600 11/17/23 0835 11/17/23 1345  BP: (!) 112/49  (!) 129/55 (!) 145/59  Pulse: 64  75 (!) 59  Resp: 20  20 16   Temp: 98.4 F (36.9 C)  (!) 97.4 F (36.3 C) 98.3 F (36.8 C)  TempSrc: Oral  Oral Oral  SpO2: 98%  98% 100%  Weight:  84.5 kg    Height:        Intake/Output Summary (Last 24 hours) at 11/17/2023 1635 Last data filed at 11/17/2023 1337 Gross per 24 hour  Intake 603 ml  Output 1050 ml  Net -447 ml   Filed Weights   11/15/23 0246 11/16/23 0335 11/17/23 0600  Weight: 84 kg 83.7 kg 84.5 kg    Examination:  General: No acute distress. Cardiovascular: RRR Lungs: unlabored, CTAB Neurological: Alert and oriented 3. Moves all extremities 4 with equal strength. Cranial nerves II through XII grossly intact. Extremities: No clubbing or cyanosis. No edema.   Data Reviewed: I have personally reviewed following labs and imaging studies  CBC: Recent Labs  Lab 11/13/23 0634 11/14/23 0234 11/15/23 0234 11/16/23 0752 11/17/23 0250  WBC 8.2 9.2 6.7 6.1 5.7  NEUTROABS  --   --   --  4.5  --   HGB 11.5* 10.9* 9.4* 10.2* 9.5*  HCT 35.2* 33.4* 28.2* 30.8* 28.4*  MCV 93.4 91.5 92.5 91.7 91.0  PLT 153 155 128* 157 153    Basic Metabolic Panel: Recent Labs  Lab 11/13/23 0634 11/13/23 1554 11/14/23 0234 11/15/23 0846 11/16/23 0752 11/17/23 0250  NA 136  --  133* 131* 131* 130*  K 5.5*  --  4.6 4.4 4.1 3.9  CL 98  --  96* 96* 96* 97*  CO2 26  --  22 26 21* 22  GLUCOSE 122*  --  144* 161* 127* 118*  BUN 29*  --  26* 34* 40* 45*  CREATININE 1.27*  --  1.30* 1.50* 1.48* 1.58*  CALCIUM 9.2  --  8.9 8.1* 8.0* 8.0*  MG  --  1.7  --   --  1.6* 1.9  PHOS  --   --   --   --  3.6 3.4    GFR: Estimated Creatinine Clearance (by C-G formula based on SCr of 1.58 mg/dL (H)) Male: 27 mL/min ([name] Ray) Male: 33 mL/min ([name] Ray)  Liver Function Tests: Recent Labs  Lab 11/16/23 0752  AST 18  ALT 16  ALKPHOS 56  BILITOT 0.4   PROT 6.0*  ALBUMIN 3.0*    CBG: Recent Labs  Lab 11/13/23 1536 11/13/23 2035 11/14/23 0640  GLUCAP 89 147* 127*     No results found for this or any previous visit (from the past 240 hours).       Radiology Studies: No results found.      Scheduled Meds:  aspirin   650 mg Oral Q8H   colchicine  0.6 mg Oral BID   enoxaparin  (LOVENOX ) injection  40 mg Subcutaneous Q24H   lidocaine   1 patch Transdermal Q24H   pantoprazole   40 mg Oral Daily   simvastatin   20 mg Oral QHS   sodium chloride  flush  3 mL Intravenous Q12H   tamsulosin   0.4 mg Oral QPC supper   Continuous Infusions:   LOS: 0 days    Time spent: over 30 min    Meliton Monte, MD [facility]   To contact the attending provider between 7A-7P or the covering provider during after hours 7P-7A, please log into the web site [email or web address] and access using universal [place] password for that web site. If you do not have the password, please call the hospital operator.  11/17/2023, 4:35 PM

## 2023-11-17 NOTE — Progress Notes (Signed)
 Heart Failure Navigator Progress Note  Assessed for Heart & Vascular TOC clinic readiness.  Patient does not meet criteria due to .symptoms more related to new afib, EF  55-60% NO HF TOC.   Navigator will sign off at this time.   Stephane Haddock, BSN, Scientist, Clinical (histocompatibility And Immunogenetics) Only

## 2023-11-17 NOTE — Plan of Care (Signed)
  Problem: Clinical Measurements: Goal: Respiratory complications will improve Outcome: Progressing   Problem: Activity: Goal: Risk for activity intolerance will decrease Outcome: Progressing   Problem: Coping: Goal: Level of anxiety will decrease Outcome: Progressing   Problem: Safety: Goal: Ability to remain free from injury will improve Outcome: Progressing   

## 2023-11-18 ENCOUNTER — Other Ambulatory Visit (HOSPITAL_COMMUNITY): Payer: Self-pay

## 2023-11-18 DIAGNOSIS — R079 Chest pain, unspecified: Secondary | ICD-10-CM | POA: Diagnosis not present

## 2023-11-18 MED ORDER — PANTOPRAZOLE SODIUM 40 MG PO TBEC
40.0000 mg | DELAYED_RELEASE_TABLET | Freq: Every day | ORAL | 0 refills | Status: AC
  Filled 2023-11-18: qty 30, 30d supply, fill #0

## 2023-11-18 MED ORDER — APIXABAN 2.5 MG PO TABS
2.5000 mg | ORAL_TABLET | Freq: Two times a day (BID) | ORAL | 0 refills | Status: DC
  Filled 2023-11-18: qty 60, 30d supply, fill #0

## 2023-11-18 MED ORDER — ASPIRIN 325 MG PO TABS
650.0000 mg | ORAL_TABLET | Freq: Three times a day (TID) | ORAL | 0 refills | Status: AC
  Filled 2023-11-18: qty 4, 1d supply, fill #0

## 2023-11-18 MED ORDER — COLCHICINE 0.6 MG PO TABS
0.6000 mg | ORAL_TABLET | Freq: Two times a day (BID) | ORAL | 0 refills | Status: AC
  Filled 2023-11-18: qty 60, 30d supply, fill #0

## 2023-11-18 NOTE — Plan of Care (Signed)
   Problem: Education: Goal: Knowledge of General Education information will improve Description: Including pain rating scale, medication(s)/side effects and non-pharmacologic comfort measures Outcome: Progressing   Problem: Activity: Goal: Risk for activity intolerance will decrease Outcome: Progressing   Problem: Elimination: Goal: Will not experience complications related to bowel motility Outcome: Progressing

## 2023-11-18 NOTE — Discharge Summary (Signed)
 Physician Discharge Summary  Kolby Schara FMW:[id number] DOB: [birth year] DOA: 11/13/2023  PCP: Vernadine Charlie ORN, MD  Admit date: 11/13/2023 Discharge date: 11/18/2023  Time spent: 40 minutes  Recommendations for Outpatient Follow-up:  Follow outpatient CBC/CMP  Follow with cardiology outpatient - follow symptoms of pericarditis Trace LE edema on exam on day of discharge, patient notes chronic - attention to volume status outpatient Started on eliquis, follow H/H outpatient Attention to BP outpatient   Discharge Diagnoses:  Principal Problem:   Acute chest pain Active Problems:   Elevated troponin   New onset Remedios Mckone-fib (HCC)   Coronary artery disease   Chronic diastolic CHF (congestive heart failure) (HCC)   Hyperkalemia   Controlled type 2 diabetes mellitus without complication, without long-term current use of insulin  (HCC)   Chronic kidney disease, stage 3a (HCC)   Normocytic anemia   Hyperlipidemia   Acute diastolic heart failure (HCC)   Demand ischemia (HCC)   Acute idiopathic pericarditis   Discharge Condition: stable  Diet recommendation: heart healthy  Filed Weights   11/16/23 0335 11/17/23 0600 11/18/23 0544  Weight: 83.7 kg 84.5 kg 85 kg    History of present illness:   89 yr old with hx CAD, CKD IIIa, obesity, HTN, who presented with CP and SOB.    The patient had Arya Luttrull back injury from Monque Haggar fall in Aug 2025 resulting in Erian Rosengren vertebral fracture. He takes oxycodone  for the back pain. He has been sleeping in Dennise Bamber recliner since his fall until the last couple of weeks.    He's been treated for pericarditis with aspirin /colchicine.  Improving up until the day of discharge.  Stable 11/4 for discharge with outpatient cardiology follow up.  Hospital Course:  Assessment and Plan:  Pericarditis Chest Pain  Left Shoulder Pain Elevated Troponin  Hx CAD Appreciate cardiology assistance, currently being treated for pericarditis with aspirin /colchicine - gradually improving.   Plan for 5 days aspirin  (to complete today) and colchicine x1 month.   Echo with preserved EF, severe LVH, no pericardial effusion Workup additionally as indicated - follow with cardiology outpatient   Atrial fibrillation Anticoagulation currently on hold with pericarditis above Planning to start eliquis after he completes 5 days of aspirin  (11/5)   Diastolic Heart Failure Has trace LE edema, he notes chronic Net negative, weights stable since admission Follow outpatient    Hyperkalemia Resolved   T2DM Linagliptin /metformin   A1c 5.6 09/02/2023   CKD stage IIIa  Will trend   Anemia Trend - relatively stable   Dyslipidemia Statin LDL 37    Procedures: Echo IMPRESSIONS     1. Left ventricular ejection fraction, by estimation, is 55 to 60%. The  left ventricle has normal function. Left ventricular endocardial border  not optimally defined to evaluate regional wall motion. There is severe  left ventricular hypertrophy of the  basal-septal segment. Left ventricular diastolic function could not be  evaluated.   2. Right ventricular systolic function is normal. The right ventricular  size is normal. There is moderately elevated pulmonary artery systolic  pressure. The estimated right ventricular systolic pressure is 45.7 mmHg.   3. The mitral valve is normal in structure. Trivial mitral valve  regurgitation. No evidence of mitral stenosis.   4. The aortic valve is tricuspid. Aortic valve regurgitation is not  visualized. Aortic valve sclerosis/calcification is present, without any  evidence of aortic stenosis. Aortic valve area, by VTI measures 1.64 cm.  Aortic valve mean gradient measures  4.0 mmHg. Aortic valve Vmax measures 1.45  m/s.   5. The inferior vena cava is normal in size with <50% respiratory  variability, suggesting right atrial pressure of 8 mmHg.    Consultations: cardiology  Discharge Exam: Vitals:   11/18/23 0336 11/18/23 0818  BP: (!) 142/60 (!)  125/50  Pulse: 62 74  Resp: 18 20  Temp: (!) 97.4 F (36.3 C) 98.1 F (36.7 C)  SpO2: 100% 100%   L shoulder discomfort improved  General: No acute distress. Cardiovascular: irregularly irregular, rate in 60's on tele Lungs: unlabored Neurological: Alert and oriented 3. Moves all extremities 88 with equal strength. Cranial nerves II through XII grossly intact. Skin: Warm and dry. No rashes or lesions. Extremities: trace LE edema (he notes is chronic)  Discharge Instructions   Discharge Instructions     Call MD for:  difficulty breathing, headache or visual disturbances   Complete by: As directed    Call MD for:  extreme fatigue   Complete by: As directed    Call MD for:  hives   Complete by: As directed    Call MD for:  persistant dizziness or light-headedness   Complete by: As directed    Call MD for:  persistant nausea and vomiting   Complete by: As directed    Call MD for:  redness, tenderness, or signs of infection (pain, swelling, redness, odor or green/yellow discharge around incision site)   Complete by: As directed    Call MD for:  severe uncontrolled pain   Complete by: As directed    Call MD for:  temperature >100.4   Complete by: As directed    Diet - low sodium heart healthy   Complete by: As directed    Discharge instructions   Complete by: As directed    You were seen for concern for pericarditis.  You've improved with aspirin  and colchicine.  You'll discharge and take 2 more doses of aspirin .  You'll continue colchicine for at least an additional month, but will need to follow with cardiology outpatient to determine how long you'll need to take this.  For your atrial fibrillation, we've started you on eliquis.  Follow up with your PCP and/or cardiology for refills.  Your ramipril  was paused at your last hospitalization, I've discontinued this.  Your blood pressure should be followed outpatient.   You should follow up this hospitalization with your PCP  outpatient.  Your should have labs repeated within 1-2 weeks of discharge to ensure things are stable.  Return for new, recurrent, or worsening symptoms.  Please ask your PCP to request records from this hospitalization so they know what was done and what the next steps will be.   Increase activity slowly   Complete by: As directed    No wound care   Complete by: As directed       Allergies as of 11/18/2023       Reactions   Atorvastatin Other (See Comments)   SEVERE LEG PAIN   Rosuvastatin Other (See Comments)   JOINT PAIN    Morphine  Other (See Comments)   confusing   Clindamycin Rash, Other (See Comments)   PATIENT REFUSES        Medication List     STOP taking these medications    clopidogrel  75 MG tablet Commonly known as: PLAVIX    ramipril  10 MG capsule Commonly known as: ALTACE        TAKE these medications    acetaminophen  500 MG tablet Commonly known as: TYLENOL  Take 1,000 mg by  mouth every 6 (six) hours as needed for mild pain (pain score 1-3).   apixaban 2.5 MG Tabs tablet Commonly known as: ELIQUIS Take 1 tablet (2.5 mg total) by mouth 2 (two) times daily. Follow up with your PCP or cardiologist for refills Start taking on: November 19, 2023   aspirin  325 MG tablet Take 2 tablets (650 mg total) by mouth every 8 (eight) hours for 2 doses.   colchicine 0.6 MG tablet Take 1 tablet (0.6 mg total) by mouth 2 (two) times daily. Follow up with cardiology for refills   HM Lidocaine  Patch 4 % Generic drug: lidocaine  Place 1 patch onto the skin daily as needed (pain).   Jentadueto  XR 2.05-998 MG Tb24 Generic drug: linaGLIPtin -metFORMIN  HCl ER Take 1 tablet by mouth daily.   loperamide  2 MG tablet Commonly known as: IMODIUM  Kaitlinn Iversen-D Take 2 mg by mouth daily as needed for diarrhea or loose stools.   loratadine  10 MG tablet Commonly known as: CLARITIN  Take 10 mg by mouth daily as needed for allergies.   Multi-Vitamin tablet Take 1 tablet by mouth  daily.   nitroGLYCERIN  0.4 MG SL tablet Commonly known as: NITROSTAT  Place 1 tablet (0.4 mg total) under the tongue every 5 (five) minutes as needed for chest pain. Please keep upcoming appt. Thank you   oxyCODONE -acetaminophen  5-325 MG tablet Commonly known as: PERCOCET/ROXICET Take 1 tablet by mouth every 6 (six) hours as needed.   pantoprazole  40 MG tablet Commonly known as: PROTONIX  Take 1 tablet (40 mg total) by mouth daily. Start taking on: November 19, 2023   simvastatin  20 MG tablet Commonly known as: ZOCOR  Take 20 mg by mouth at bedtime.   tamsulosin  0.4 MG Caps capsule Commonly known as: FLOMAX  Take 0.4 mg by mouth daily after supper.   VITAMIN B-12 PO Take 1 tablet by mouth daily.       Allergies  Allergen Reactions   Atorvastatin Other (See Comments)    SEVERE LEG PAIN     Rosuvastatin Other (See Comments)    JOINT PAIN       Morphine  Other (See Comments)    confusing   Clindamycin Rash and Other (See Comments)    PATIENT REFUSES    Follow-up Information     [facility] Follow up.   Why: They provide Physical therapy at home and this will resume Contact information: [address] [place] [address] [phone]                 The results of significant diagnostics from this hospitalization (including imaging, microbiology, ancillary and laboratory) are listed below for reference.    Significant Diagnostic Studies: ECHOCARDIOGRAM COMPLETE Result Date: 11/13/2023    ECHOCARDIOGRAM REPORT   Patient Name:   KEIGEN CADDELL Date of Exam: 11/13/2023 Medical Rec #:  [id number]         Height:       66.0 in Accession #:    [id number]        Weight:       187.4 lb Date of Birth:  February 09, 1933          BSA:          1.945 m Patient Age:    89 years          BP:           129/47 mmHg Patient Gender: M                 HR:  52 bpm. Exam Location:  Inpatient Procedure: 2D Echo, Cardiac Doppler and Color Doppler (Both Spectral and  Color            Flow Doppler were utilized during procedure). Indications:    Chest pain R07.9  History:        Patient has prior history of Echocardiogram examinations, most                 recent 07/04/2020. Previous Myocardial Infarction and CAD,                 Arrythmias:AV Block Mobitz 1, Signs/Symptoms:Chest Pain,                 Dizziness/Lightheadedness and Shortness of Breath; Risk                 Factors:Diabetes and Hyperlipidemia.  Sonographer:    BERNARDA ROCKS Referring Phys: [id number] RONDELL Zanobia Griebel SMITH IMPRESSIONS  1. Left ventricular ejection fraction, by estimation, is 55 to 60%. The left ventricle has normal function. Left ventricular endocardial border not optimally defined to evaluate regional wall motion. There is severe left ventricular hypertrophy of the basal-septal segment. Left ventricular diastolic function could not be evaluated.  2. Right ventricular systolic function is normal. The right ventricular size is normal. There is moderately elevated pulmonary artery systolic pressure. The estimated right ventricular systolic pressure is 45.7 mmHg.  3. The mitral valve is normal in structure. Trivial mitral valve regurgitation. No evidence of mitral stenosis.  4. The aortic valve is tricuspid. Aortic valve regurgitation is not visualized. Aortic valve sclerosis/calcification is present, without any evidence of aortic stenosis. Aortic valve area, by VTI measures 1.64 cm. Aortic valve mean gradient measures 4.0 mmHg. Aortic valve Vmax measures 1.45 m/s.  5. The inferior vena cava is normal in size with <50% respiratory variability, suggesting right atrial pressure of 8 mmHg. FINDINGS  Left Ventricle: Left ventricular ejection fraction, by estimation, is 55 to 60%. The left ventricle has normal function. Left ventricular endocardial border not optimally defined to evaluate regional wall motion. The left ventricular internal cavity size was normal in size. There is severe left ventricular hypertrophy  of the basal-septal segment. Left ventricular diastolic function could not be evaluated. Right Ventricle: The right ventricular size is normal. No increase in right ventricular wall thickness. Right ventricular systolic function is normal. There is moderately elevated pulmonary artery systolic pressure. The tricuspid regurgitant velocity is 3.07 m/s, and with an assumed right atrial pressure of 8 mmHg, the estimated right ventricular systolic pressure is 45.7 mmHg. Left Atrium: Left atrial size was normal in size. Right Atrium: Right atrial size was normal in size. Pericardium: There is no evidence of pericardial effusion. Mitral Valve: The mitral valve is normal in structure. Trivial mitral valve regurgitation. No evidence of mitral valve stenosis. MV peak gradient, 6.0 mmHg. The mean mitral valve gradient is 2.0 mmHg. Tricuspid Valve: The tricuspid valve is normal in structure. Tricuspid valve regurgitation is trivial. No evidence of tricuspid stenosis. Aortic Valve: The aortic valve is tricuspid. Aortic valve regurgitation is not visualized. Aortic valve sclerosis/calcification is present, without any evidence of aortic stenosis. Aortic valve mean gradient measures 4.0 mmHg. Aortic valve peak gradient measures 8.4 mmHg. Aortic valve area, by VTI measures 1.64 cm. Pulmonic Valve: The pulmonic valve was normal in structure. Pulmonic valve regurgitation is not visualized. No evidence of pulmonic stenosis. Aorta: The aortic root is normal in size and structure. Venous: The inferior vena cava is normal in  size with less than 50% respiratory variability, suggesting right atrial pressure of 8 mmHg. IAS/Shunts: No atrial level shunt detected by color flow Doppler.  LEFT VENTRICLE PLAX 2D LVIDd:         3.90 cm LVIDs:         2.80 cm LV PW:         1.00 cm LV IVS:        1.63 cm LVOT diam:     2.00 cm LV SV:         57 LV SV Index:   29 LVOT Area:     3.14 cm  LV Volumes (MOD) LV vol d, MOD A2C: 132.0 ml LV vol d, MOD  A4C: 140.0 ml LV vol s, MOD A2C: 47.0 ml LV vol s, MOD A4C: 59.0 ml LV SV MOD A2C:     85.0 ml LV SV MOD A4C:     140.0 ml LV SV MOD BP:      87.1 ml RIGHT VENTRICLE            IVC RV Basal diam:  3.00 cm    IVC diam: 1.80 cm RV S prime:     8.92 cm/s TAPSE (M-mode): 1.6 cm RVSP:           40.7 mmHg LEFT ATRIUM             Index        RIGHT ATRIUM           Index LA diam:        3.90 cm 2.00 cm/m   RA Pressure: 3.00 mmHg LA Vol (A2C):   42.9 ml 22.05 ml/m  RA Area:     11.00 cm LA Vol (A4C):   48.5 ml 24.93 ml/m  RA Volume:   20.30 ml  10.44 ml/m LA Biplane Vol: 46.7 ml 24.01 ml/m  AORTIC VALVE                    PULMONIC VALVE AV Area (Vmax):    1.87 cm     PV Vmax:          1.01 m/s AV Area (Vmean):   1.60 cm     PV Peak grad:     4.1 mmHg AV Area (VTI):     1.64 cm     PR End Diast Vel: 0.90 msec AV Vmax:           145.00 cm/s AV Vmean:          94.000 cm/s AV VTI:            0.347 m AV Peak Grad:      8.4 mmHg AV Mean Grad:      4.0 mmHg LVOT Vmax:         86.40 cm/s LVOT Vmean:        47.800 cm/s LVOT VTI:          0.181 m LVOT/AV VTI ratio: 0.52  AORTA Ao Root diam: 2.70 cm Ao Asc diam:  3.10 cm MITRAL VALVE                TRICUSPID VALVE MV Area (PHT): 4.96 cm     TR Peak grad:   37.7 mmHg MV Area VTI:   1.90 cm     TR Vmax:        307.00 cm/s MV Peak grad:  6.0 mmHg     Estimated RAP:  3.00 mmHg MV Mean grad:  2.0 mmHg     RVSP:           40.7 mmHg MV Vmax:       1.22 m/s MV Vmean:      69.8 cm/s    SHUNTS MV Decel Time: 153 msec     Systemic VTI:  0.18 m MV E velocity: 136.00 cm/s  Systemic Diam: 2.00 cm MV Delisha Peaden velocity: 51.40 cm/s MV E/Denissa Cozart ratio:  2.65 Wilbert Bihari MD Electronically signed by Wilbert Bihari MD Signature Date/Time: 11/13/2023/2:28:29 PM    Final    CT Angio Chest PE W and/or Wo Contrast Result Date: 11/13/2023 CLINICAL DATA:  Chest pain with pleuritic component beginning this morning. Positive D-dimer. Evaluate for pulmonary embolism. EXAM: CT ANGIOGRAPHY CHEST WITH CONTRAST  TECHNIQUE: Multidetector CT imaging of the chest was performed using the standard protocol during bolus administration of intravenous contrast. Multiplanar CT image reconstructions and MIPs were obtained to evaluate the vascular anatomy. RADIATION DOSE REDUCTION: This exam was performed according to the departmental dose-optimization program which includes automated exposure control, adjustment of the mA and/or kV according to patient size and/or use of iterative reconstruction technique. CONTRAST:  75mL OMNIPAQUE  IOHEXOL  350 MG/ML SOLN COMPARISON:  09/01/2023 FINDINGS: Cardiovascular: Heart is normal size. Calcified plaque over the left main and 3 vessel coronary arteries. Stent over the left anterior descending coronary artery. Stent over the right coronary artery. Thoracic aorta is normal caliber. Calcified plaque over the descending thoracic aorta. Pulmonary arteries are well opacified without evidence of emboli. Remaining vascular structures are unremarkable. Mediastinum/Nodes: No mediastinal or hilar adenopathy. Remaining mediastinal structures are unremarkable. Lungs/Pleura: Lungs are adequately inflated. No focal lobar consolidation or significant effusion. Tiny amount of bilateral basilar pleural fluid. Subtle dependent bibasilar atelectatic change. Airways are normal. Upper Abdomen: Calcified plaque over the abdominal aorta. No acute findings in the visualized upper abdomen. Musculoskeletal: No focal abnormality. Review of the MIP images confirms the above findings. IMPRESSION: 1. No acute cardiopulmonary disease and no evidence of pulmonary embolism. 2. Aortic atherosclerosis. Atherosclerotic coronary artery disease with coronary stents as described. 3. Tiny amount of bilateral basilar pleural fluid with subtle dependent bibasilar atelectatic change. Aortic Atherosclerosis (ICD10-I70.0). Electronically Signed   By: Toribio Agreste M.D.   On: 11/13/2023 08:56   DG Chest 2 View Result Date:  11/13/2023 EXAM: 2 VIEW(S) XRAY OF THE CHEST 11/13/2023 06:52:00 AM COMPARISON: AP radiograph of the chest dated 09/01/2023. CLINICAL HISTORY: Central chest pain, worse with deep breath, 8/10, not relieved with nitroglycerin . History of MI, but current pain feels different. FINDINGS: LUNGS AND PLEURA: Azygous fissure is incidentally demonstrated. No focal pulmonary opacity. No pulmonary edema. No pleural effusion. No pneumothorax. HEART AND MEDIASTINUM: There is calcification within the aortic arch. No acute abnormality of the cardiac and mediastinal silhouettes. BONES AND SOFT TISSUES: No acute osseous abnormality. IMPRESSION: 1. No acute cardiopulmonary pathology related to the clinical history of chest pain. Electronically signed by: Evalene Coho MD 11/13/2023 06:57 AM EDT RP Workstation: [id number]    Microbiology: No results found for this or any previous visit (from the past 240 hours).   Labs: Basic Metabolic Panel: Recent Labs  Lab 11/13/23 0634 11/13/23 1554 11/14/23 0234 11/15/23 0846 11/16/23 0752 11/17/23 0250  NA 136  --  133* 131* 131* 130*  K 5.5*  --  4.6 4.4 4.1 3.9  CL 98  --  96* 96* 96* 97*  CO2 26  --  22 26 21* 22  GLUCOSE 122*  --  144* 161*  127* 118*  BUN 29*  --  26* 34* 40* 45*  CREATININE 1.27*  --  1.30* 1.50* 1.48* 1.58*  CALCIUM 9.2  --  8.9 8.1* 8.0* 8.0*  MG  --  1.7  --   --  1.6* 1.9  PHOS  --   --   --   --  3.6 3.4   Liver Function Tests: Recent Labs  Lab 11/16/23 0752  AST 18  ALT 16  ALKPHOS 56  BILITOT 0.4  PROT 6.0*  ALBUMIN 3.0*   No results for input(s): LIPASE, AMYLASE in the last 168 hours. No results for input(s): AMMONIA in the last 168 hours. CBC: Recent Labs  Lab 11/13/23 0634 11/14/23 0234 11/15/23 0234 11/16/23 0752 11/17/23 0250  WBC 8.2 9.2 6.7 6.1 5.7  NEUTROABS  --   --   --  4.5  --   HGB 11.5* 10.9* 9.4* 10.2* 9.5*  HCT 35.2* 33.4* 28.2* 30.8* 28.4*  MCV 93.4 91.5 92.5 91.7 91.0  PLT 153 155 128*  157 153   Cardiac Enzymes: No results for input(s): CKTOTAL, CKMB, CKMBINDEX, TROPONINI in the last 168 hours. BNP: BNP (last 3 results) Recent Labs    11/13/23 1554  BNP 625.7*    ProBNP (last 3 results) No results for input(s): PROBNP in the last 8760 hours.  CBG: Recent Labs  Lab 11/13/23 1536 11/13/23 2035 11/14/23 0640  GLUCAP 89 147* 127*       Signed:  Meliton Monte MD.  [facility] 11/18/2023, 9:44 AM

## 2023-11-18 NOTE — Progress Notes (Signed)
 Reviewed AVS, patient expressed understanding of medications, MD follow up reviewed.   See LDA for information on wounds at discharge. Patient states all belongings brought to the hospital at time of admission are accounted for and packed to take home. Picked up medications from [facility] pharmacy. Vol. Transport contacted to transport patient to entrance A, where family member was waiting in vehicle to transport home.

## 2023-11-18 NOTE — Progress Notes (Signed)
 Mobility Specialist Progress Note:    11/18/23 1055  Mobility  Activity Ambulated with assistance;Respositioned in chair (Ankle pumps, Leg Ext, Heel slides)  Level of Assistance Standby assist, set-up cues, supervision of patient - no hands on  Assistive Device Front wheel walker  Distance Ambulated (ft) 15 ft  Range of Motion/Exercises Left leg;Right leg;Active  Activity Response Tolerated well  Mobility Referral Yes  Mobility visit 1 Mobility  Mobility Specialist Start Time (ACUTE ONLY) 1055  Mobility Specialist Stop Time (ACUTE ONLY) 1117  Mobility Specialist Time Calculation (min) (ACUTE ONLY) 22 min   Received pt in recliner agreeable to session. No c/o any symptoms. Pt moving and ambulating well along w/ being able to perform exercises well. Returned pt to recliner w/ all needs met.   Venetia Keel Mobility Specialist Please Neurosurgeon or Rehab Office at [phone]

## 2023-11-18 NOTE — Evaluation (Signed)
 Physical Therapy Brief Evaluation and Discharge Note Patient Details Name: [name] Ray MRN: [id number] DOB: 1933-05-06 Today's Date: 11/18/2023   History of Present Illness  89 yo M adm 11/13/23 with SOB, new AFib, pericarditis. PMhx: T2DM, HTN, carotid artery disease, bil TKA, Lt THA, BPH, AV block, CHF, CKD, CAD, HLD  Clinical Impression  Pt pleasant and reports no falls since recent spinal fx but more cautious and aware with mobility. He keeps RW with him at all times with gait and reports no deficits with functional activities with use of adaptive equipment. Pt active with HHPT and will defer any further needs back to current services. Pt walked in hall with RW with HR 79-109 and SPO2 99% on RA.        PT Assessment All further PT needs can be met in the next venue of care  Assistance Needed at Discharge  PRN    Equipment Recommendations None recommended by PT  Recommendations for Other Services       Precautions/Restrictions Precautions Precautions: Fall Recall of Precautions/Restrictions: Intact        Mobility  Bed Mobility       General bed mobility comments: standing on arrival  Transfers Overall transfer level: Modified independent                      Ambulation/Gait Ambulation/Gait assistance: Modified independent (Device/Increase time) Gait Distance (Feet): 350 Feet Assistive device: Rolling walker (2 wheels) Gait Pattern/deviations: Step-through pattern, Decreased stride length, Trunk flexed Gait Speed: Pace WFL General Gait Details: pt with decreased grip on RW, pushes it ahead of himself but uses for reassurance not actual balance or support since recent fall  Home Activity Instructions    Stairs            Modified Rankin (Stroke Patients Only)        Balance Overall balance assessment: Mild deficits observed, not formally tested, History of Falls   Sitting balance-Leahy Scale: Normal       Standing balance-Leahy  Scale: Good            Pertinent Vitals/Pain PT - Brief Vital Signs All Vital Signs Stable: Yes Pain Assessment Pain Assessment: No/denies pain     Home Living Family/patient expects to be discharged to:: Private residence Living Arrangements: Spouse/significant other Available Help at Discharge: Family;Available 24 hours/day Home Environment: Ramped entrance   Home Equipment: Agricultural Consultant (2 wheels);Rollator (4 wheels);Adaptive equipment;Grab bars - tub/shower;Programmer, Systems: Reacher;Sock aid;Long-handled building services engineer      Prior Function Level of Independence: Independent with assistive device(s)      UE/LE Assessment   UE ROM/Strength/Tone/Coordination: WFL    LE ROM/Strength/Tone/Coordination: WFL      Communication   Communication Communication: No apparent difficulties     Cognition Overall Cognitive Status: Appears within functional limits for tasks assessed/performed       General Comments      Exercises     Assessment/Plan    PT Problem List Decreased activity tolerance;Decreased balance;Decreased mobility       PT Visit Diagnosis Other abnormalities of gait and mobility (R26.89)    No Skilled PT     Co-evaluation                AMPAC 6 Clicks Help needed turning from your back to your side while in a flat bed without using bedrails?: None Help needed moving from lying on your back to sitting on the side of a flat  bed without using bedrails?: None Help needed moving to and from a bed to a chair (including a wheelchair)?: None Help needed standing up from a chair using your arms (e.g., wheelchair or bedside chair)?: None Help needed to walk in hospital room?: None Help needed climbing 3-5 steps with a railing? : A Little 6 Click Score: 23      End of Session   Activity Tolerance: Patient tolerated treatment well Patient left: in chair;with call bell/phone within reach Nurse Communication: Mobility  status PT Visit Diagnosis: Other abnormalities of gait and mobility (R26.89)     Time: 9084-9073 PT Time Calculation (min) (ACUTE ONLY): 11 min  Charges:   PT Evaluation $PT Eval Low Complexity: 1 Low      Megean Fabio P, PT Acute Rehabilitation Services Office: [phone]   Lenoard NOVAK Dayvon Dax  11/18/2023, 10:17 AM

## 2023-11-25 DIAGNOSIS — G8929 Other chronic pain: Secondary | ICD-10-CM | POA: Diagnosis not present

## 2023-11-25 DIAGNOSIS — R269 Unspecified abnormalities of gait and mobility: Secondary | ICD-10-CM | POA: Diagnosis not present

## 2023-11-25 DIAGNOSIS — M5441 Lumbago with sciatica, right side: Secondary | ICD-10-CM | POA: Diagnosis not present

## 2023-11-27 ENCOUNTER — Telehealth: Payer: Self-pay | Admitting: Cardiovascular Disease

## 2023-11-27 LAB — LAB REPORT - SCANNED
Creatinine, POC: 38.4 mg/dL
EGFR: 63.2

## 2023-11-27 NOTE — Telephone Encounter (Signed)
 [name] Ray with [facility] called in asking if there are any restrictions for the pt since he was just hospitalized. He prefers something be faxed over.    Fax [phone]

## 2023-11-27 NOTE — Telephone Encounter (Signed)
 Will forward to Dr Wonda for review if any restrictions needed for PT.

## 2023-11-30 NOTE — Telephone Encounter (Signed)
 No specific restrictions. He can participate in physical therapy per your normal protocol.

## 2023-12-01 ENCOUNTER — Ambulatory Visit: Attending: Cardiovascular Disease | Admitting: Cardiovascular Disease

## 2023-12-01 ENCOUNTER — Encounter: Payer: Self-pay | Admitting: Cardiovascular Disease

## 2023-12-01 VITALS — BP 116/60 | HR 63 | Ht 66.0 in | Wt 195.4 lb

## 2023-12-01 DIAGNOSIS — I25119 Atherosclerotic heart disease of native coronary artery with unspecified angina pectoris: Secondary | ICD-10-CM | POA: Diagnosis not present

## 2023-12-01 DIAGNOSIS — I300 Acute nonspecific idiopathic pericarditis: Secondary | ICD-10-CM | POA: Diagnosis not present

## 2023-12-01 DIAGNOSIS — I4819 Other persistent atrial fibrillation: Secondary | ICD-10-CM | POA: Diagnosis not present

## 2023-12-01 DIAGNOSIS — I10 Essential (primary) hypertension: Secondary | ICD-10-CM | POA: Diagnosis not present

## 2023-12-01 NOTE — Patient Instructions (Signed)
 Medication Instructions:  Your physician has recommended you make the following change in your medication:  1) STOP taking colchicine when you run out of tablets  *If you need a refill on your cardiac medications before your next appointment, please call your pharmacy*  Follow-Up: At [facility], you and your health needs are our priority.  As part of our continuing mission to provide you with exceptional heart care, our providers are all part of one team.  This team includes your primary Cardiologist (physician) and Advanced Practice Providers or APPs (Physician Assistants and Nurse Practitioners) who all work together to provide you with the care you need, when you need it.  Your next appointment:   3 months  Provider:   One of our Advanced Practice Providers (APPs): Morse Clause, PA-C  Lamarr Satterfield, NP Miriam Shams, NP  Olivia Pavy, PA-C Josefa Beauvais, NP  Leontine Salen, PA-C Orren Fabry, PA-C  [place], PA-C Ernest Dick, NP  Damien Braver, NP Jon Hails, PA-C  Waddell Donath, PA-C    Dayna Dunn, PA-C  Scott Weaver, PA-C Lum Louis, NP Katlyn West, NP Callie Goodrich, PA-C  Xika Zhao, NP Sheng Haley, PA-C    Kathleen Johnson, PA-C

## 2023-12-01 NOTE — Assessment & Plan Note (Signed)
 Symptoms had resolved.  He had mixed data in the hospital with no EKG changes and some typical and atypical features of pericarditis.  I talked to them today about the possibility that he could have had inflammation related to his fall or he may truly have had pericarditis.  He did show improvement with colchicine and this will be continued until his current bottle runs out, then he is advised to stop it.  Recommend clinical follow-up in 3 months with an APP.

## 2023-12-01 NOTE — Progress Notes (Signed)
 Cardiology Office Note:    Date:  12/01/2023   ID:  [name] Ray, DOB May 28, 1933, MRN [id number]  PCP:  Vernadine Charlie ORN, MD   [facility] Providers Cardiologist:  Ozell Fell, MD Cardiology APP:  Lelon Glendia [name]     Referring MD: Vernadine Charlie ORN, MD   Chief Complaint  Patient presents with   Atrial Fibrillation    History of Present Illness:    [name] Ray is a 89 y.o. adult with a hx of:  Coronary artery disease S/p Inf MI in 2002 tx with PCI S/p Taxus DES to LAD in 2004 S/p Cypher DES to pLAD and Cypher DES to [facility] in 05/2004 LHC in 2011 - RCA, LCx and LAD stents patent (mild to mod non-obs dz in LAD, LCx, RCA) Myoview  07/2018: low risk  Echo 07/04/20: EF 55-60, GR 1 DD, no RWMA, mild LVH, normal RVSF  Diabetes Mellitus Hypertension Amlodipine  D/C'd 2024 with low BP and dizziness Hyperlipidemia Chronic kidney disease II-III Carotid artery disease US  07/2018: bilat ICA 1-39; R subcl stenosis  US  07/26/20: L 40-59; ? Prox bilat VA stenosis; R subclavian stenosis  US  07/31/2021: R ICA 1-39, LICA 40-59 (no VA or subclavian stenosis noted) BPH Prostate CA s/p radiation Aortic atherosclerosis  Mobitz I  Monitor 02/2021: NSR, occ Mobitz I, no high grade HB, no sustained arrhythmias, AFib/Flutter  When I saw him last in August 2025, he reported that he was doing well, with regular exercise at the [facility] without exertional symptoms.  He was felt to be stable from a cardiovascular perspective.  He then had a fall and was hospitalized with an unstable L3 fracture with evidence of iliopsoas hematoma.  However, he was hospitalized October 30 through November 18, 2023 for acute chest pain.  Some of the characteristics of his pain were suggestive of pericarditis and he was treated with aspirin  and colchicine.  He was found to have atrial fibrillation and was ultimately started on apixaban after completing his aspirin  therapy.  The patient is here with his  wife today.  He has been doing pretty well since hospital discharge.  His primary complaint is dizziness and fatigue.  He has not had any recurrent chest pain.  He is dizzy with standing and states that this is a long-term issue for him over several years.  He thinks it may have been a little worse lately.  He has not had presyncope or frank syncope.  He denies shortness of breath, orthopnea, PND, or leg swelling.  He had labs drawn by his primary care physician last week after he saw them in hospital follow-up.  He was told that his magnesium level was low and he has started supplementing magnesium.   Current Medications: Current Meds  Medication Sig   acetaminophen  (TYLENOL ) 500 MG tablet Take 1,000 mg by mouth every 6 (six) hours as needed for mild pain (pain score 1-3).   amoxicillin (AMOXIL) 500 MG capsule Take 500 mg by mouth 3 (three) times daily. (Patient taking differently: Take 500 mg by mouth 3 (three) times daily. Prior to dental appts.)   apixaban (ELIQUIS) 2.5 MG TABS tablet Take 1 tablet (2.5 mg total) by mouth 2 (two) times daily. Follow up with your PCP or cardiologist for refills   colchicine 0.6 MG tablet Take 1 tablet (0.6 mg total) by mouth 2 (two) times daily. Follow up with cardiology for refills   JENTADUETO  XR 2.05-998 MG TB24 Take 1 tablet by mouth daily.  lidocaine  (HM LIDOCAINE  PATCH) 4 % Place 1 patch onto the skin daily as needed (pain).   loperamide  (IMODIUM  A-D) 2 MG tablet Take 2 mg by mouth daily as needed for diarrhea or loose stools.   loratadine  (CLARITIN ) 10 MG tablet Take 10 mg by mouth daily as needed for allergies.   Magnesium 250 MG TABS Take by mouth daily.   Multiple Vitamin (MULTI-VITAMIN) tablet Take 1 tablet by mouth daily.   nitroGLYCERIN  (NITROSTAT ) 0.4 MG SL tablet Place 1 tablet (0.4 mg total) under the tongue every 5 (five) minutes as needed for chest pain. Please keep upcoming appt. Thank you   oxyCODONE -acetaminophen  (PERCOCET/ROXICET) 5-325 MG  tablet Take 1 tablet by mouth every 6 (six) hours as needed.   simvastatin  (ZOCOR ) 20 MG tablet Take 20 mg by mouth at bedtime.   tamsulosin  (FLOMAX ) 0.4 MG CAPS capsule Take 0.4 mg by mouth daily after supper.     Allergies:   Atorvastatin, Rosuvastatin, Morphine , and Clindamycin   ROS:   Please see the history of present illness.    All other systems reviewed and are negative.  EKGs/Labs/Other Studies Reviewed:    The following studies were reviewed today: Cardiac Studies & Procedures   ______________________________________________________________________________________________   STRESS TESTS  MYOCARDIAL PERFUSION IMAGING 07/22/2018  Interpretation Summary  Nuclear stress EF: 63%. The left ventricular ejection fraction is normal (55-65%).  There was no ST segment deviation noted during stress.  Defect 1: There is a small defect of mild severity present in the basal inferior and mid inferior location. This is consistent with artifart.  This is a low risk study. No evidence of ischemia or infarction  The study is normal.   ECHOCARDIOGRAM  ECHOCARDIOGRAM COMPLETE 11/13/2023  Narrative ECHOCARDIOGRAM REPORT    Patient Name:   [name] Ray Date of Exam: 11/13/2023 Medical Rec #:  [id number]         Height:       66.0 in Accession #:    [id number]        Weight:       187.4 lb Date of Birth:  31-Oct-1933          BSA:          1.945 m Patient Age:    89 years          BP:           129/47 mmHg Patient Gender: M                 HR:           52 bpm. Exam Location:  Inpatient  Procedure: 2D Echo, Cardiac Doppler and Color Doppler (Both Spectral and Color Flow Doppler were utilized during procedure).  Indications:    Chest pain R07.9  History:        Patient has prior history of Echocardiogram examinations, most recent 07/04/2020. Previous Myocardial Infarction and CAD, Arrythmias:AV Block Mobitz 1, Signs/Symptoms:Chest Pain, Dizziness/Lightheadedness and  Shortness of Breath; Risk Factors:Diabetes and Hyperlipidemia.  Sonographer:    BERNARDA ROCKS Referring Phys: [id number] RONDELL A SMITH  IMPRESSIONS   1. Left ventricular ejection fraction, by estimation, is 55 to 60%. The left ventricle has normal function. Left ventricular endocardial border not optimally defined to evaluate regional wall motion. There is severe left ventricular hypertrophy of the basal-septal segment. Left ventricular diastolic function could not be evaluated. 2. Right ventricular systolic function is normal. The right ventricular size is normal. There is moderately elevated pulmonary artery systolic  pressure. The estimated right ventricular systolic pressure is 45.7 mmHg. 3. The mitral valve is normal in structure. Trivial mitral valve regurgitation. No evidence of mitral stenosis. 4. The aortic valve is tricuspid. Aortic valve regurgitation is not visualized. Aortic valve sclerosis/calcification is present, without any evidence of aortic stenosis. Aortic valve area, by VTI measures 1.64 cm. Aortic valve mean gradient measures 4.0 mmHg. Aortic valve Vmax measures 1.45 m/s. 5. The inferior vena cava is normal in size with <50% respiratory variability, suggesting right atrial pressure of 8 mmHg.  FINDINGS Left Ventricle: Left ventricular ejection fraction, by estimation, is 55 to 60%. The left ventricle has normal function. Left ventricular endocardial border not optimally defined to evaluate regional wall motion. The left ventricular internal cavity size was normal in size. There is severe left ventricular hypertrophy of the basal-septal segment. Left ventricular diastolic function could not be evaluated.  Right Ventricle: The right ventricular size is normal. No increase in right ventricular wall thickness. Right ventricular systolic function is normal. There is moderately elevated pulmonary artery systolic pressure. The tricuspid regurgitant velocity is 3.07 m/s, and with  an assumed right atrial pressure of 8 mmHg, the estimated right ventricular systolic pressure is 45.7 mmHg.  Left Atrium: Left atrial size was normal in size.  Right Atrium: Right atrial size was normal in size.  Pericardium: There is no evidence of pericardial effusion.  Mitral Valve: The mitral valve is normal in structure. Trivial mitral valve regurgitation. No evidence of mitral valve stenosis. MV peak gradient, 6.0 mmHg. The mean mitral valve gradient is 2.0 mmHg.  Tricuspid Valve: The tricuspid valve is normal in structure. Tricuspid valve regurgitation is trivial. No evidence of tricuspid stenosis.  Aortic Valve: The aortic valve is tricuspid. Aortic valve regurgitation is not visualized. Aortic valve sclerosis/calcification is present, without any evidence of aortic stenosis. Aortic valve mean gradient measures 4.0 mmHg. Aortic valve peak gradient measures 8.4 mmHg. Aortic valve area, by VTI measures 1.64 cm.  Pulmonic Valve: The pulmonic valve was normal in structure. Pulmonic valve regurgitation is not visualized. No evidence of pulmonic stenosis.  Aorta: The aortic root is normal in size and structure.  Venous: The inferior vena cava is normal in size with less than 50% respiratory variability, suggesting right atrial pressure of 8 mmHg.  IAS/Shunts: No atrial level shunt detected by color flow Doppler.   LEFT VENTRICLE PLAX 2D LVIDd:         3.90 cm LVIDs:         2.80 cm LV PW:         1.00 cm LV IVS:        1.63 cm LVOT diam:     2.00 cm LV SV:         57 LV SV Index:   29 LVOT Area:     3.14 cm  LV Volumes (MOD) LV vol d, MOD A2C: 132.0 ml LV vol d, MOD A4C: 140.0 ml LV vol s, MOD A2C: 47.0 ml LV vol s, MOD A4C: 59.0 ml LV SV MOD A2C:     85.0 ml LV SV MOD A4C:     140.0 ml LV SV MOD BP:      87.1 ml  RIGHT VENTRICLE            IVC RV Basal diam:  3.00 cm    IVC diam: 1.80 cm RV S prime:     8.92 cm/s TAPSE (M-mode): 1.6 cm RVSP:  40.7  mmHg  LEFT ATRIUM             Index        RIGHT ATRIUM           Index LA diam:        3.90 cm 2.00 cm/m   RA Pressure: 3.00 mmHg LA Vol (A2C):   42.9 ml 22.05 ml/m  RA Area:     11.00 cm LA Vol (A4C):   48.5 ml 24.93 ml/m  RA Volume:   20.30 ml  10.44 ml/m LA Biplane Vol: 46.7 ml 24.01 ml/m AORTIC VALVE                    PULMONIC VALVE AV Area (Vmax):    1.87 cm     PV Vmax:          1.01 m/s AV Area (Vmean):   1.60 cm     PV Peak grad:     4.1 mmHg AV Area (VTI):     1.64 cm     PR End Diast Vel: 0.90 msec AV Vmax:           145.00 cm/s AV Vmean:          94.000 cm/s AV VTI:            0.347 m AV Peak Grad:      8.4 mmHg AV Mean Grad:      4.0 mmHg LVOT Vmax:         86.40 cm/s LVOT Vmean:        47.800 cm/s LVOT VTI:          0.181 m LVOT/AV VTI ratio: 0.52  AORTA Ao Root diam: 2.70 cm Ao Asc diam:  3.10 cm  MITRAL VALVE                TRICUSPID VALVE MV Area (PHT): 4.96 cm     TR Peak grad:   37.7 mmHg MV Area VTI:   1.90 cm     TR Vmax:        307.00 cm/s MV Peak grad:  6.0 mmHg     Estimated RAP:  3.00 mmHg MV Mean grad:  2.0 mmHg     RVSP:           40.7 mmHg MV Vmax:       1.22 m/s MV Vmean:      69.8 cm/s    SHUNTS MV Decel Time: 153 msec     Systemic VTI:  0.18 m MV E velocity: 136.00 cm/s  Systemic Diam: 2.00 cm MV A velocity: 51.40 cm/s MV E/A ratio:  2.65  Wilbert Bihari MD Electronically signed by Wilbert Bihari MD Signature Date/Time: 11/13/2023/2:28:29 PM    Final    MONITORS  LONG TERM MONITOR (3-14 DAYS) 02/28/2021  Narrative Patch Wear Time:  14 days and 0 hours (2023-01-24T12:00:12-498 to 2023-02-07T12:00:16-0500)  Patient had a min HR of 28 bpm, max HR of 158 bpm, and avg HR of 66 bpm. Predominant underlying rhythm was Sinus Rhythm. 1 run of Supraventricular Tachycardia occurred lasting 6 beats with a max rate of 158 bpm (avg 149 bpm). Second Degree AV Block-Mobitz I (Wenckebach) was present. Isolated SVEs were rare (<1.0%), SVE  Couplets were rare (<1.0%), and SVE Triplets were rare (<1.0%). Isolated VEs were rare (<1.0%, 678), VE Couplets were rare (<1.0%, 30), and VE Triplets were rare (<1.0%, 2).  SUMMARY: The basic rhythm is normal sinus with an average  heart rate of 66 bpm.  Occasional Mobitz 1 AV block noted.  No high-grade heart block.  No sustained arrhythmias.  No atrial fibrillation or flutter.       ______________________________________________________________________________________________      EKG:        Recent Labs: 11/13/2023: B Natriuretic Peptide 625.7; TSH 1.617 11/16/2023: ALT 16 11/17/2023: BUN 45; Creatinine, Ser 1.58; Hemoglobin 9.5; Magnesium 1.9; Platelets 153; Potassium 3.9; Sodium 130  Recent Lipid Panel    Component Value Date/Time   CHOL 113 11/13/2023 1554   TRIG 28 11/13/2023 1554   HDL 70 11/13/2023 1554   CHOLHDL 1.6 11/13/2023 1554   VLDL 6 11/13/2023 1554   LDLCALC 37 11/13/2023 1554     Risk Assessment/Calculations:    CHA2DS2-VASc Score = 5   This indicates a 7.2% annual risk of stroke. The patient's score is based upon: CHF History: 0 HTN History: 1 Diabetes History: 1 Stroke History: 0 Vascular Disease History: 1 Age Score: 2 Gender Score: 0               Physical Exam:    VS:  BP 116/60 (BP Location: Left Arm, Patient Position: Sitting, Cuff Size: Normal)   Pulse 63   Ht 5' 6 (1.676 m)   Wt 195 lb 6.4 oz (88.6 kg)   SpO2 96%   BMI 31.54 kg/m     Wt Readings from Last 3 Encounters:  12/01/23 195 lb 6.4 oz (88.6 kg)  11/18/23 187 lb 6.3 oz (85 kg)  09/01/23 187 lb 6.3 oz (85 kg)     GEN:  Well nourished, well developed delightful elderly male in no acute distress HEENT: Normal NECK: No JVD; No carotid bruits LYMPHATICS: No lymphadenopathy CARDIAC: Irregularly irregular, no murmurs, rubs, gallops RESPIRATORY:  Clear to auscultation without rales, wheezing or rhonchi  ABDOMEN: Soft, non-tender, non-distended MUSCULOSKELETAL:  No  edema; No deformity  SKIN: Warm and dry NEUROLOGIC:  Alert and oriented x 3 PSYCHIATRIC:  Normal affect   Assessment & Plan Persistent atrial fibrillation (HCC) Continue apixaban 2.5 mg twice daily.  No rate controlling agents necessarye he has his heart rate is well-controlled.  He brings in blood pressure and heart rate logs with excellent control of both.  Heart rate is generally running from 58 to 65 bpm.  I do not think that efforts at cardioversion are indicated at this time. Coronary artery disease involving native coronary artery of native heart with angina pectoris Continue current management.  No symptoms of angina noted.  Off of antiplatelet therapy because of chronic oral anticoagulation. Essential hypertension Blood pressure is in the normal range.  Patient experiencing episodic dizziness.  He is not on any blood pressure lowering medicines at this time. Acute idiopathic pericarditis Symptoms had resolved.  He had mixed data in the hospital with no EKG changes and some typical and atypical features of pericarditis.  I talked to them today about the possibility that he could have had inflammation related to his fall or he may truly have had pericarditis.  He did show improvement with colchicine and this will be continued until his current bottle runs out, then he is advised to stop it.  Recommend clinical follow-up in 3 months with an APP.  Today, the patient appears clinically stable from a cardiovascular perspective.  I talked to him about continuing with physical therapy, slowly working on his strength and conditioning, discontinuing colchicine once he completes his current prescription, and sending for his lab work from primary care  for review.          Medication Adjustments/Labs and Tests Ordered: Current medicines are reviewed at length with the patient today.  Concerns regarding medicines are outlined above.  No orders of the defined types were placed in this  encounter.  No orders of the defined types were placed in this encounter.   Patient Instructions  Medication Instructions:  Your physician has recommended you make the following change in your medication:  1) STOP taking colchicine when you run out of tablets  *If you need a refill on your cardiac medications before your next appointment, please call your pharmacy*  Follow-Up: At [facility], you and your health needs are our priority.  As part of our continuing mission to provide you with exceptional heart care, our providers are all part of one team.  This team includes your primary Cardiologist (physician) and Advanced Practice Providers or APPs (Physician Assistants and Nurse Practitioners) who all work together to provide you with the care you need, when you need it.  Your next appointment:   3 months  Provider:   One of our Advanced Practice Providers (APPs): Morse Clause, PA-C  Lamarr Satterfield, NP Miriam Shams, NP  Olivia Pavy, PA-C Josefa Beauvais, NP  Leontine Salen, PA-C Orren Fabry, PA-C  [place], PA-C Ernest Dick, NP  Damien Braver, NP Jon Hails, PA-C  Waddell Donath, PA-C    Dayna Dunn, PA-C  Scott Weaver, PA-C Lum Louis, NP Katlyn West, NP Callie Goodrich, PA-C  Xika Zhao, NP Thom Sluder, PA-C    Kathleen Johnson, PA-C       Signed, Ozell Fell, MD  12/01/2023 5:27 PM    [facility]

## 2023-12-01 NOTE — Assessment & Plan Note (Signed)
 Continue current management.  No symptoms of angina noted.  Off of antiplatelet therapy because of chronic oral anticoagulation.

## 2023-12-02 DIAGNOSIS — M5441 Lumbago with sciatica, right side: Secondary | ICD-10-CM | POA: Diagnosis not present

## 2023-12-02 DIAGNOSIS — G8929 Other chronic pain: Secondary | ICD-10-CM | POA: Diagnosis not present

## 2023-12-02 DIAGNOSIS — R269 Unspecified abnormalities of gait and mobility: Secondary | ICD-10-CM | POA: Diagnosis not present

## 2023-12-03 ENCOUNTER — Ambulatory Visit: Payer: Self-pay | Admitting: Physician Assistant

## 2023-12-03 NOTE — Telephone Encounter (Signed)
 LVM for [name] Ray letting him know that per Dr. Wonda, pt doesn't have any specific restrictions and is allowed to participate in PT per their normal protocol. [name] Ray advised to call our office with any further questions or concerns.

## 2023-12-09 DIAGNOSIS — M5441 Lumbago with sciatica, right side: Secondary | ICD-10-CM | POA: Diagnosis not present

## 2023-12-09 DIAGNOSIS — G8929 Other chronic pain: Secondary | ICD-10-CM | POA: Diagnosis not present

## 2023-12-09 DIAGNOSIS — R269 Unspecified abnormalities of gait and mobility: Secondary | ICD-10-CM | POA: Diagnosis not present

## 2023-12-16 ENCOUNTER — Telehealth: Payer: Self-pay | Admitting: Cardiovascular Disease

## 2023-12-16 DIAGNOSIS — G8929 Other chronic pain: Secondary | ICD-10-CM | POA: Diagnosis not present

## 2023-12-16 DIAGNOSIS — M5441 Lumbago with sciatica, right side: Secondary | ICD-10-CM | POA: Diagnosis not present

## 2023-12-16 DIAGNOSIS — R269 Unspecified abnormalities of gait and mobility: Secondary | ICD-10-CM | POA: Diagnosis not present

## 2023-12-16 MED ORDER — APIXABAN 2.5 MG PO TABS: 2.5000 mg | ORAL_TABLET | Freq: Two times a day (BID) | ORAL | 1 refills | Status: AC

## 2023-12-16 NOTE — Telephone Encounter (Signed)
*  STAT* If patient is at the pharmacy, call can be transferred to refill team.   1. Which medications need to be refilled? (please list name of each medication and dose if known) apixaban  (ELIQUIS ) 2.5 MG TABS tablet    2. Would you like to learn more about the convenience, safety, & potential cost savings by using the [facility]? No   3. Are you open to using the [facility] (Type [facility].) No   4. Which pharmacy/location (including street and city if local pharmacy) is medication to be sent to?  [facility] [address] [place], [place] - [address]   5. Do they need a 30 day or 90 day supply? 90 day   Pt c/o medication issue:  1. Name of Medication: colchicine  0.6 MG tablet  pantoprazole  (PROTONIX ) 40 MG tablet  2. How are you currently taking this medication (dosage and times per day)?   3. Are you having a reaction (difficulty breathing--STAT)? No  4. What is your medication issue? Pt states that above medications were prescribed by the hospital. He would like to know if he should continue taking them and if so he needs refills called into [facility] on file.

## 2023-12-16 NOTE — Telephone Encounter (Signed)
 Prescription refill request for Eliquis  received. Indication: A FIB Last office visit:12/01/23 Scr: 1.1 (11/26/23 Scanned into EPIC) Age: 89 Weight: 88.6 kg  Per protocol okay to refill at current dose.

## 2023-12-23 DIAGNOSIS — G8929 Other chronic pain: Secondary | ICD-10-CM | POA: Diagnosis not present

## 2023-12-23 DIAGNOSIS — R269 Unspecified abnormalities of gait and mobility: Secondary | ICD-10-CM | POA: Diagnosis not present

## 2023-12-23 DIAGNOSIS — M5441 Lumbago with sciatica, right side: Secondary | ICD-10-CM | POA: Diagnosis not present

## 2023-12-29 ENCOUNTER — Telehealth: Payer: Self-pay | Admitting: Cardiovascular Disease

## 2023-12-29 DIAGNOSIS — R600 Localized edema: Secondary | ICD-10-CM

## 2023-12-29 MED ORDER — FUROSEMIDE 40 MG PO TABS: 40.0000 mg | ORAL_TABLET | Freq: Every day | ORAL | 3 refills | Status: AC

## 2023-12-29 MED ORDER — POTASSIUM CHLORIDE CRYS ER 20 MEQ PO TBCR: 20.0000 meq | EXTENDED_RELEASE_TABLET | Freq: Every day | ORAL | 3 refills | Status: AC

## 2023-12-29 NOTE — Telephone Encounter (Signed)
 Spoke with pt over the phone who stated he has been having leg swelling from hips to feet. Pt states he has gained about 10-12 lbs since in the hospital in October. Pt states he is having shortness of breath. SOB is worse with activity but still present at rest. HR while on the phone was 73 and states HR has been in that range. Explained to pt that I will send this message to Dr. Wonda to review and will call them back with recommendations. Pt and pt's wife verbalized understanding of plan.

## 2023-12-29 NOTE — Telephone Encounter (Signed)
 Pt c/o swelling/edema: STAT if pt has developed SOB within 24 hours  If swelling, where is the swelling located? Legs  How much weight have you gained and in what time span? Unsure  Have you gained 2 pounds in a day or 5 pounds in a week? yes  Do you have a log of your daily weights (if so, list)? N/A  Are you currently taking a fluid pill? No  Are you currently SOB? somewhat  Have you traveled recently in a car or plane for an extended period of time?

## 2023-12-29 NOTE — Telephone Encounter (Signed)
 Spoke with pt's wife and let her know that Dr. Wonda recommended starting pt on Lasix  40 mg once daily and potassium 20 mEq once daily to help with the swelling. Repeat labs should be completed at the beginning of next week and pt/ pt's wife told to call our office on Friday morning this week to let us  know how the pt is responding to the Lasix . Pt's wife verbalized understanding of plan and had no further questions at this time.

## 2024-01-02 LAB — BASIC METABOLIC PANEL WITH GFR
BUN/Creatinine Ratio: 18 (ref 10–24)
BUN: 20 mg/dL (ref 8–27)
CO2: 26 mmol/L (ref 20–29)
Calcium: 9.4 mg/dL (ref 8.6–10.2)
Chloride: 92 mmol/L — ABNORMAL LOW (ref 96–106)
Creatinine, Ser: 1.12 mg/dL (ref 0.76–1.27)
Glucose: 110 mg/dL — ABNORMAL HIGH (ref 70–99)
Potassium: 4.3 mmol/L (ref 3.5–5.2)
Sodium: 133 mmol/L — ABNORMAL LOW (ref 134–144)
eGFR: 63 mL/min/1.73

## 2024-01-03 ENCOUNTER — Ambulatory Visit: Payer: Self-pay | Admitting: Cardiovascular Disease

## 2024-01-07 NOTE — Progress Notes (Signed)
 Sounds like he's made really good progressive with diuresis, losing 12 pounds since starting furosemide . I'm not sure why his feet are still swollen but it's possible there is still some extra fluid on board. You have an appointment with Miriam Shams in February. If you are concerned, I can check and see if we have any availability prior to that.  Thx

## 2024-02-09 ENCOUNTER — Ambulatory Visit: Admitting: Podiatry

## 2024-02-27 ENCOUNTER — Ambulatory Visit: Admitting: Podiatry

## 2024-03-02 ENCOUNTER — Ambulatory Visit: Admitting: Emergency Medicine
